# Patient Record
Sex: Female | Born: 1935 | Race: White | Hispanic: No | Smoking: Never smoker
Health system: Northeastern US, Academic
[De-identification: ages and names within clinical notes are randomized; demographics above are authoritative.]

---

## 2019-01-18 HISTORY — PX: EXPLORATORY LAPAROTOMY: SHX3000

## 2019-01-18 HISTORY — PX: RIGHT COLECTOMY: SHX853

## 2019-11-03 ENCOUNTER — Ambulatory Visit (HOSPITAL_BASED_OUTPATIENT_CLINIC_OR_DEPARTMENT_OTHER): Admitting: Family Medicine

## 2019-11-03 NOTE — Progress Notes (Signed)
* * *      [name], [name] **DOB:** Jan 13, 1935 (85 yo F) **Acc No.** [id number] **DOS:**  11/03/2019    ---        [name], [name]**    ------    32 Y old Female, DOB: Oct 17, 1934    [address] [place], [place] [address]    Home: [phone]    Provider: Cheryle Horsfall        * * *    Telephone Encounter    ---    Answered by    Lajean Saver    Date: 11/03/2019        Time: 09:31 AM    Reason    new patient / appt    ------            Message                      Patient's daughter [name] helped register with PH. Patient has previous medical records with her to bring in. She has filled out the new patient packet with all her meds and such. She had a bleeding ulcer last year but other than that no recent medical hx and no concerns right now. Patient is able to bring in her records and packet, OK to book?                Action Taken                      Kylia Grajales G 11/03/2019 9:43:11 AM >  OK to book this youngster      Phing,Molyda  11/03/2019 10:23:27 AM > Spoke to patient and she is booked 04/28 @ 11:30 for new patient establish care, has CPE in August 2020. She already filled out her NP packet and has her medical records, advised her to stop by next week to drop off so we can scan ahead of time                    * * *                ---          * * *          Provider: Cheryle Horsfall 11/03/2019    ---    Note generated by eClinicalWorks EMR/PM Software ([email or web address])

## 2019-11-15 ENCOUNTER — Ambulatory Visit (HOSPITAL_BASED_OUTPATIENT_CLINIC_OR_DEPARTMENT_OTHER): Admitting: Family Medicine

## 2019-11-15 ENCOUNTER — Ambulatory Visit: Admitting: Family Medicine

## 2019-11-15 LAB — HX CBC W/ INDICES
CASE NUMBER: 2021118001758
HX ABSOLUTE NRBC COUNT: 0 10*3/uL
HX HCT: 40.8 % — NL (ref 36.0–47.0)
HX HGB: 12.5 g/dL — NL (ref 11.8–16.0)
HX MCH: 27.3 pg — NL (ref 26.0–34.0)
HX MCHC: 30.6 g/dL — ABNORMAL LOW (ref 31.0–37.0)
HX MCV: 89.1 fL — NL (ref 80.0–100.0)
HX MPV: 12 fL — NL (ref 9.4–12.4)
HX NRBC PERCENT: 0 % — NL
HX PLATELET: 277 10*3/uL — NL (ref 150.0–400.0)
HX RBC: 4.58 10*6/uL — NL (ref 3.9–5.2)
HX RDW-CV: 15.6 % — ABNORMAL HIGH (ref 11.5–14.5)
HX RDW-SD: 50.4 fL — NL (ref 35.0–51.0)
HX WBC: 7.1 10*3/uL — NL (ref 3.7–11.2)

## 2019-11-15 LAB — HX TSH
CASE NUMBER: 2021118001758
HX 3RD GEN TSH: 2.83 u[IU]/mL — NL (ref 0.358–3.74)

## 2019-11-15 LAB — HX IRON PROFILE
CASE NUMBER: 2021118001758
HX % IRON BOUND: 13 % — NL (ref 10.0–50.0)
HX FERRITIN LVL: 36 ng/mL — NL (ref 11.0–307.0)
HX IRON: 49 ug/dL — NL (ref 30.0–160.0)
HX TIBC: 366 ug/dL — NL (ref 250.0–450.0)

## 2019-11-15 NOTE — Progress Notes (Signed)
[name], [name] **DOB:** 1935-04-05 (85 yo F) **Acc No.** [id number] **DOS:**  11/15/2019    ---      ** Progress Notes  **    ---    **Patient:** [name], [name]    **Account Number:** [id number]    **Provider:** Electa Sniff, M.D    **DOB:** 14-Nov-1934  **Age:** 85 Y  **Sex:** Female    **Date:** 11/15/2019    **Phone:** [phone]    **Address:** [address] [place], [place]        * * *        **Subjective:**        ---      **Chief Complaints:**    ------      1\. New patient establish care.    ------      **HPI:**    _Interim History_ :    85 yo recently moved from [place]. had been healthy for many years until last  year, dx with PUD. needed iron infusions. 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 tx for insomnia and hypothyroid. hx of basal cell  cancer.    ------      **ROS:**    _CONSTITUTIONAL_ :    Unexplained weight change  none  .  no  Fever.  no  Chills.    _OPTHALMOLOGY_ :    Glasses or contacts  none  .    _ENT_ :    Dental care  up-to-date  .  no  Rhinorrhea. Hoarseness  none  . Swollen Lymph  Nodes  none  .    _ALLERGY_ :    Environmental Allergies  none  .    _RESPIRATORY_ :    Shortness of Breath  none  . Persistent Cough  none  .    _CARDIOLOGY_ :    Chest Pain  none  . Palpitations  none  . Leg Edema  none  . Exertional  symptoms  none  .    _GASTROENTEROLOGY_ :    no  Heartburn.    _FEMALE REPRODUCTIVE_ :    Hot Flashes  none  . Abnormal Vaginal Discharge  none  . Dyspareunia  none  .  Breast lump  none  . Practices SBE  regulary  . Vaginal dryness  none  .    _UROLOGY_ :    Blood in Urine  none  . Urinary Incontinence  none  . Nocturia  None  .  Dysuria  none  .    _MUSCULOSKELETAL_ :    Positive for  OA of hands  .    _NEUROLOGY_ :    Positive for  neuropathy of toes  . Headaches  none  . Weakness  none  .  Tingling/Numbness  none  . Visual Changes  none  . Dizziness  none  .    _PSYCHOLOGY_ :    c/o  Sleep Disturbances.    _DERMATOLOGY_ :    New  or changing skin lesions  none  . Positive for  hx of basal cell cancer  .    _HEMATOLOGY/LYMPH_ :    Bleeding tendencies  none  .        ------      **Medical History:** PUD, Carcinoid tumor, Insomnia, Hypothyroid, Basal  cell cancer.        ------      **Surgical History:** PUD , partial colectomy 2020 .    ------      **Family History:**    no CAD,  no DM.    ------      **Social History:** no Tobacco Use . Seat belt: yes. no Alcohol. no Drug  use. Diet/Nutrition: Two meals a day. Exercise: yes. Marital Status: widowed.  no Domestic violence. Children: 3. Number of household members: 5. Occupation:  Retired Engineer, maintenance (IT). Occup. exposure: none. no Travel outside [place]. no  Caffeine. Home smoke detector use: yes. no Pets.    ------      **Medications:** Taking amitriptyline 50 mg tablet orally , Taking  levothyroxine 125 mcg (0.125 mg) tablet orally    ------      **Allergies:** N.K.D.A.    ------        **Objective:**        ---      **Vitals:** BP **132/80** , Temp **98.3** , Ht **63** , Wt **111** , BMI  **19.66** .    ------      **Examination:    ** _General Examination:_    General  thin. no dementia  .    HEENT:  clear conjunctiva  ,  TM's clear and flat bilaterally  ,  nose clear  ,  oropharynx clear with MMM  .    Oral cavity:  clear  ,  moist mucus membranes  .    Heart:  RRR  ,  no murmurs  .    Lungs:  clear to auscultation bilaterally  ,  no wheezes/rhonchi/rales  .    Abdomen:  soft, NT/ND, BS present  ,  no masses palpated  ,  no  hepatosplenomegaly  .    Back:  normal ROM of spine  .    Extremities  no clubbing, no edema  .    Peripheral pulses:  normal (2+) bilaterally  .    Skin:  healed scars from abdominal surgery last year.  .    Neurologic Exam:  numbness of toes.  Marland Kitchen        ------            **Assessment:**        ---      **Assessment:**        1\. Peptic ulcer disease - K27.9 (Primary)    2\. Iron deficiency anemia - D50.9    3\. Insomnia - G47.00    4\. Hypothyroid - E03.9     5\. Neuropathy - G62.9    6\. Skin cancer, basal cell - C44.91    7\. Hx of carcinoid syndrome - Z86.39    ------        **Plan:**        ---        **1\. Peptic ulcer disease**    _LAB: CBC w/ Indices_     Value    Reference Range    ---------    WBC    7.1      3.7-11.2 - thous/mm3    RBC    4.58      3.90-5.20 - Mil/mm3    ------------    Hgb    12.5      11.8-16.0 - Gm/dL    ------------    Hct    40.8      36.0-47.0 - %    ------------    MCV    89.1      80.0-100.0 - fL    ------------    MCH    27.3      26.0-34.0 - pGm    ------------    MCHC  30.6    L    31.0-37.0 - Gm/dL    ------------    Platelet    277      150-400 - thous/mm3    ------------    RDW-SD    50.4      35.0-51.0 - fL    ------------    MPV    12.0      9.4-12.4 - fL    ------------    _LAB: Iron Profile_   Value    Reference Range    ---------    TIBC    366      250-450 - mcg/dL    % Iron Bound    13      10-50 - %    ------------    Ferritin Lvl    36      11-307 - nGm/ml    ------------    Iron    49      30-160 - mcg/dL    ------------        Notes: pantoprazole and avoid triggers. check hct. needed iron infusions  several times last year.    **2\. Iron deficiency anemia**    Notes: recheck and transfuse prn.    **3\. Insomnia**    Notes: amitriptylline helps.    **4\. Hypothyroid**    _LAB: TSH_     Value    Reference Range    ---------    3rd Gen TSH    2.830      0.358-3.740 - mclU/ml        Notes: recheck and adjust prn.    **5\. Neuropathy**    Notes: both feet. failed gabapentin. not diabetic.    **6\. Skin cancer, basal cell**    Notes: needs derm follow up.    **7\. Hx of carcinoid syndrome**    Notes: had partial colectomy last year. weight stable.    **8\. Others**    Notes: will need old records.      **Preventive:**    Counseling: Vision Care . Dental Care . Diet . Exercise . Calcium and vitamin  D . Sunscreen . Breast  self exam .    ------      **Follow Up:** 6 Months, prn    ------    ---    ---                ---    Electronically signed by Electa Sniff , MD on 11/16/2019 at 08:15 AM EDT    Sign off status: Completed          * * *      **Provider:** Electa Sniff, M.D    **Date:** 11/15/2019    ------

## 2019-11-16 ENCOUNTER — Ambulatory Visit (HOSPITAL_BASED_OUTPATIENT_CLINIC_OR_DEPARTMENT_OTHER): Admitting: Family Medicine

## 2019-11-16 NOTE — Progress Notes (Signed)
* * *      [name], [name] **DOB:** [birth year] (85 yo F) **Acc No.** [id number] **DOS:**  11/16/2019    ---        [name], [name]**    ------    61 Y old Female, DOB: Oct 28, 1934    [address] [place], [place] [address]    Home: [phone]    Provider: Cheryle Horsfall        * * *    Telephone Encounter    ---    Answered by    Cheryle Horsfall    Date: 11/16/2019        Time: 07:16 PM    Reason    results    ------            Message                      lab tests normal                Action Taken                      [name] 11/16/2019 7:16:23 PM > reported                    * * *                ---          * * *          Provider: Cheryle Horsfall 11/16/2019    ---    Note generated by eClinicalWorks EMR/PM Software ([email or web address])

## 2019-11-16 NOTE — Progress Notes (Signed)
* * *      [name], [name] **DOB:** 1935/03/05 (85 yo F) **Acc No.** [id number] **DOS:**  11/16/2019    ---        [name], [name]**    ------    5 Y old Female, DOB: 18-Jun-1935    [address], [place], [place] [address]    Home: [phone]    Provider: Cheryle Horsfall        * * *    Telephone Encounter    ---    Answered by    Cheryle Horsfall    Date: 11/16/2019        Time: 10:52 AM    Reason    [facility] referral    ------            Message                      hx of basal cell cancer                Action Taken                      Lissy Deuser G 11/16/2019 10:52:51 AM >   pls refer to Dr Jenel Lucks group for this. tx [facility]      Parker,Amelia  11/16/2019 4:26:34 PM > booked for 12/21/19 at 2:00 PM with Zadie Cleverly. Patient notified of appt details. also looks like patient needs 27mo f/u - booked that while on the phone with her as well.                    * * *              * * *        ---        Reason for Appointment    ---      1\. [facility] referral    ---          * * *          Provider: Cheryle Horsfall 11/16/2019    ---    Note generated by eClinicalWorks EMR/PM Software ([email or web address])

## 2019-11-16 NOTE — Progress Notes (Signed)
* * *      [place], [place] **DOB:** 05/02/1935 (85 yo F) **Acc No.** [id number] **DOS:**  11/16/2019    ---        [name], [name]**    ------    68 Y old Female, DOB: 1935/02/23    [address], [place], [place] [address]    Home: [phone]    Provider: Cheryle Horsfall        * * *    Telephone Encounter    ---    Answered by    Cheryle Horsfall    Date: 11/16/2019        Time: 07:47 AM    Message                      refills and derm referral        ------            Action Taken                      Ory Elting G 11/16/2019 7:47:21 AM >  erx done                Refills    Refill amitriptyline tablet, 50 mg, orally, 180, 1-2 tabs, qhs,  Refills=1    ------      Refill levothyroxine tablet, 125 mcg (0.125 mg), orally, 90, 1 tab(s), qd,  Refills=1          * * *                ---          * * *          Provider: Cheryle Horsfall 11/16/2019    ---    Note generated by eClinicalWorks EMR/PM Software ([email or web address])

## 2019-11-23 ENCOUNTER — Ambulatory Visit (HOSPITAL_BASED_OUTPATIENT_CLINIC_OR_DEPARTMENT_OTHER): Admitting: Family Medicine

## 2019-11-23 ENCOUNTER — Inpatient Hospital Stay
Admit: 2019-11-23 | Disposition: A | Discharge status: Home / Self Care | Source: Home / Self Care | Attending: Internal Medicine | Admitting: Internal Medicine

## 2019-11-23 LAB — HX COMPREHENSIVE METABOLIC PANEL
CASE NUMBER: 2021126002568
HX ALBUMIN LVL: 4.5 g/dL — NL (ref 3.2–5.0)
HX ALKALINE PHOSPHATASE: 143 U/L — ABNORMAL HIGH (ref 30.0–117.0)
HX ALT: 18 U/L — NL (ref 6.0–55.0)
HX ANION GAP: 8 — NL (ref 3.0–11.0)
HX AST: 13 U/L — NL (ref 6.0–40.0)
HX BILIRUBIN TOTAL: 0.4 mg/dL — NL (ref 0.2–1.2)
HX BUN: 28 mg/dL — ABNORMAL HIGH (ref 8.0–23.0)
HX CALCIUM LVL: 11.5 mg/dL — ABNORMAL HIGH (ref 8.5–10.5)
HX CHLORIDE: 98 mmol/L — NL (ref 98.0–110.0)
HX CO2: 28 mmol/L — NL (ref 21.0–32.0)
HX CREATININE: 0.927 mg/dL — NL (ref 0.55–1.3)
HX GLUCOSE LVL: 150 mg/dL — ABNORMAL HIGH (ref 70.0–110.0)
HX POTASSIUM LVL: 3.6 mmol/L — NL (ref 3.6–5.2)
HX SODIUM LVL: 134 mmol/L — ABNORMAL LOW (ref 136.0–146.0)
HX TOTAL PROTEIN: 8.7 g/dL — ABNORMAL HIGH (ref 6.0–8.4)

## 2019-11-23 LAB — HX CBC W/ DIFF
CASE NUMBER: 2021126002568
HX ABSOLUTE NRBC COUNT: 0 10*3/uL
HX HCT: 46.6 % — NL (ref 36.0–47.0)
HX HGB: 15.4 g/dL — NL (ref 11.8–16.0)
HX MCH: 28.2 pg — NL (ref 26.0–34.0)
HX MCHC: 33 g/dL — NL (ref 31.0–37.0)
HX MCV: 85.2 fL — NL (ref 80.0–100.0)
HX MPV: 11.5 fL — NL (ref 9.4–12.4)
HX NRBC PERCENT: 0 % — NL
HX PLATELET: 444 10*3/uL — ABNORMAL HIGH (ref 150.0–400.0)
HX RBC: 5.47 10*6/uL — ABNORMAL HIGH (ref 3.9–5.2)
HX RDW-CV: 14.6 % — ABNORMAL HIGH (ref 11.5–14.5)
HX RDW-SD: 44.9 fL — NL (ref 35.0–51.0)
HX WBC: 22.3 10*3/uL — ABNORMAL HIGH (ref 3.7–11.2)

## 2019-11-23 LAB — HX LIPASE LEVEL
CASE NUMBER: 21066835
HX LIPASE LVL: 260 U/L — NL (ref 73.0–393.0)

## 2019-11-23 LAB — HX BLUE TOP TO HOLD: CASE NUMBER: 2021126002568

## 2019-11-23 LAB — HX SST GOLD TUBE TO HOLD: CASE NUMBER: 2021126002568

## 2019-11-23 LAB — HX URINE DIPSTICK W/REFLEX
CASE NUMBER: 2021126002569
HX UA BILIRUBIN: NEGATIVE — NL
HX UA BLOOD: NEGATIVE — NL
HX UA GLUCOSE: NEGATIVE — NL
HX UA HYALINE CASTS: 1 /LPF — NL (ref 0.0–4.0)
HX UA KETONES: NEGATIVE — NL
HX UA LEUKOCYTE ESTERASE: NEGATIVE — NL
HX UA NITRITE: NEGATIVE — NL
HX UA PH: 6 — NL (ref 5.0–8.0)
HX UA PROTEIN: 100 mg/dL — AB
HX UA RBC: 1 /HPF — NL (ref 0.0–2.0)
HX UA SPECIFIC GRAVITY: 1.021 — NL (ref 1.003–1.03)
HX UA SQUAMOUS EPITHELIAL: <1 — NL (ref 0.0–5.0)
HX UA UROBILINOGEN: NEGATIVE — NL
HX UA WBC: 1 /HPF — NL (ref 0.0–5.0)

## 2019-11-23 LAB — HX TROPONIN I
CASE NUMBER: 21066835
HX TROPONIN I: <0.015 — NL (ref 0.015–0.045)

## 2019-11-23 LAB — HX .AUTOMATED DIFF
CASE NUMBER: 2021126002568
HX ABSOLUTE BASO COUNT: 0.05 10*3/uL — NL (ref 0.0–0.22)
HX ABSOLUTE EOS COUNT: 0.01 10*3/uL — NL (ref 0.0–0.45)
HX ABSOLUTE LYMPHS COUNT: 1 10*3/uL — NL (ref 0.74–5.04)
HX ABSOLUTE MONO COUNT: 1.52 10*3/uL — ABNORMAL HIGH (ref 0.0–1.34)
HX ABSOLUTE NEUTRO COUNT: 19.62 10*3/uL — ABNORMAL HIGH (ref 1.48–7.95)
HX BASOPHILS: 0.2 %
HX EOSINOPHILS: 0 %
HX IMMATURE GRANULOCYTES: 0.6 % — NL (ref 0.0–2.0)
HX LYMPHOCYTES: 4.5 %
HX MONOCYTES: 6.8 %
HX NEUTROPHILS: 87.9 %

## 2019-11-23 LAB — HX LACTIC ACID
CASE NUMBER: 2021126003106
CASE NUMBER: 2021126003284
HX LACTIC ACID LVL: 2.4 mmol/L — ABNORMAL HIGH (ref 0.4–2.0)
HX LACTIC ACID LVL: 3 mmol/L — ABNORMAL HIGH (ref 0.4–2.0)

## 2019-11-23 LAB — HX GLOMERULAR FILTRATION RATE (ESTIMATED)
CASE NUMBER: 2021126002568
HX AFN AMER GLOMERULAR FILTRATION RATE: 66 mL/min/{1.73_m2}
HX NON-AFN AMER GLOMERULAR FILTRATION RATE: 57 mL/min/{1.73_m2}

## 2019-11-23 NOTE — Consults (Signed)
 cc: Rebbeca Paul, M.D.  ____________________________________________________________    CONSULTATION  DATE:  11/24/2019    PRIMARY CARE PHYSICIAN:  Electa Sniff, M.D.    HISTORY OF PRESENT ILLNESS:  The patient is an 85 year old female who used to   live in [place] most of her life.  She recently moved here.  She was having   abdominal pain for two or three days, mostly in the epigastrium and right upper   quadrant area.  She vomited a few times.  She was noted to have dark stools.    Her hemoglobin dropped from 15.6 to 8.4 overnight.    EGD was done earlier today by Dr. Elesa Hacker.  I reviewed the findings with her.    The patient has a large duodenal ulcer, but the scope was able to pass through   okay.    The patient had what sounds like perforated peptic ulcer disease in the spring   of 2020. at that time, she was taken to the Operating Room and underwent   laparotomy and what sounds like a Midwife.  The appendix was   incidentally removed.  It turns out the pathology showed a carcinoid.  She then   underwent a right colectomy through another midline incision also in [place].  I  only have the pathology report on the right colectomy and it shows no further   carcinoid noted and 16 lymph nodes are negative.  I do not have either operative  report.  There is a note in the chart here that she had her gallbladder removed.   The patient tells me she still has her gallbladder.    PAST MEDICAL HISTORY:  Hypothyroidism, GERD, depression.    PAST SURGICAL HISTORY:  As above.  Excision of BCC on the anterior abdominal   wall a few months ago.    SOCIAL HISTORY:  Nonsmoker and nondrinker.    ALLERGIES:  None.    MEDICATIONS:  1.  Amitriptyline.  2.  Levothyroxine.  3.  Protonix, she is supposed to take 40 mg daily; however, she tells me she   more often than does not take it at all.    REVIEW OF SYSTEMS:  No cardiac or pulmonary problems.  Otherwise, all other   systems reviewed and negative.    LABORATORY  DATA:  Reviewed.  This morning, BMP is unremarkable.  Calcium 11.5   yesterday, 7.8 today.  Lactic acid normalized.  White count 15.9.    RADIOLOGY DATA:  CAT scan of the abdomen and pelvis was done yesterday evening.   I reviewed the images.  A moderate-sized hiatal hernia noted.  Stomach was very   distended gallbladder was definitely noted and unremarkable.  Right colon is   absent.  Ileocolic anastomosis noted in the right abdomen.    ASSESSMENT:  Peptic ulcer disease, recurrent, although suboptimally treated.    PLAN:  At this point, we will continue with an IV Protonix twice a day and   discharge her home on twice a day proton pump inhibitor with a plan to rescope   in three to four weeks.  If she continues to have any bleeding or develops   gastric outlet obstruction, or if there is nonhealing after multiple weeks of a   PPI, we will proceed with antrectomy and gastrojejunostomy as well as vagotomy.   This was discussed at length with her and her daughter at the bedside.  We will   follow closely.    Dictated by:  Beverly Milch, M.D.    DD: 11/24/2019 17:42:57  DT: 11/27/2019 10:04:00  WM/wmt  Job #: [id number]   SIGNATURE LINE    Electronically signed by Donny Pique MD, [place] on 12/14/2019 at 13:02:30 EST

## 2019-11-23 NOTE — Progress Notes (Signed)
 Name :  [name], [name]    DOB :  [birth year]    Sex :  Female    MRN :  [id number]    Subjective    Chart and medications reviewed    No complaints of bright red bleeding per rectum, nausea, vomiting    Review of Systems    Objective    Vitals & Measurements    **T: **97.9 ??F (Oral) **TMIN: **97.9 ??F (Oral) **TMAX: **99.0 ??F (Oral) **HR:  **100 **RR: **24 **BP: **137/67 **SpO2: **97%    Physical Exam    General: Alert and oriented, no acute distress    Eye: PERRL, Normal Conjunctiva    HEENT: Normocephalic, no damage to dentition, TM's clear, good light reflex,  normal hearing, moist oral mucosa, no pharyngeal erythema, ear canals patent,  no sinus tenderness    Neck: supple, nontender, carotid pulse WNL, no carotid bruit, no JVD, no  lymphadenopathy, no thyromegaly, full ROM    Respiratory: Lungs clear to auscultation, respirations non-labored, breath  sounds equal and regular, symmetrical chest expansion, no chest wall  tenderness    Cardiovascular: Normal Rate, normal rhythm, no gallop, good pulses equal in  all extremities, normal peripheral perfusion,    Gastrointestinal: Abdomen soft, non tender, non-distended, normal bowel sounds  all four quadrants, no organomegaly,    Genitourinary: No CVA tenderness,    Lymphatics: no lymphadenopathy neck, axilla, groin    Musculoskeletal: normal ROM, normal strength, no tenderness, no swelling, no  deformity, normal gait    Integumentary: Skin intact, warm, pink, dry. No pallor, no rash.    Neurologic: Alert, Oriented, normal sensory, normal motor, no focal deficits.    Cognition and Speech: Oriented, speech clear and coherent, functional  cognition intact, expression WNL    Psychiatric: Cooperative, appropriate mood & affect, normal judgment, non-  suicidal      Medications    _Inpatient_    acetaminophen tablet, 650 mg= 2 tab(s), PO, q4hr, PRN    amitriptyline, 100 mg= 2 tab(s), PO, HS    Lactated Ringers 1,000 mL, 1000 mL, IV    levothyroxine, 125 mcg= 1 tab(s),  PO, Daily    morPHINE, 2 mg= 1 mL, IV Push, q2hr, PRN    morPHINE, 2 mg= 1 mL, IV Push, q4hr, PRN    NaCl 0.9% bolus 1,000 mL, 1000 mL, IV    NaCl 0.9% bolus 1,000 mL, 1000 mL, IV    Sodium Chloride 0.9% 1,000 mL, 1000 mL, IV    Sodium Chloride 0.9% 250 mL, 250 mL, IV    Sodium Chloride 0.9% 50 mL + pantoprazole IV Additive 40 mg    Sodium Chloride 0.9% Flush, 3 mL, Flush, qshift    Zofran, 4 mg= 2 mL, IV Push, q8hr, PRN    Zosyn    Lab Results    Glucose Lvl: 88 mg/dL (68/11/57 26:20:35)    BUN: 10 mg/dL (59/74/16 38:45:36)    Creatinine: 0.422 mg/dL Low (46/80/32 12:24:82)    Afn Amer Glomerular Filtration Rate: >90 (11/25/19 06:01:00)    Non-Afn Amer Glomerular Filtration Rate: >90 (11/25/19 06:01:00)    Sodium Lvl: 140 mmol/L (11/25/19 06:01:00)    Potassium Lvl: 3.2 mmol/L Low (11/25/19 06:01:00)    Chloride: 108 mmol/L (11/25/19 06:01:00)    CO2: 25 mmol/L (11/25/19 06:01:00)    Anion Gap: 7 (11/25/19 06:01:00)    Total Protein: 5.5 Gm/dL Low (50/03/70 48:88:91)    Albumin Lvl: 3 Gm/dL Low (69/45/03 88:82:80)    Calcium Lvl: 7.9  mg/dL Low (12/81/18 86:77:37)    Bilirubin Total: 0.5 mg/dL (36/68/15 94:70:76)    Alkaline Phosphatase: 96 Units/L (11/25/19 06:01:00)    AST: 10 Units/L (11/25/19 06:01:00)    ALT: 13 Units/L (11/25/19 06:01:00)    WBC: 10.6 thous/mm3 (11/25/19 06:01:00)    RBC: 3.53 Mil/mm3 Low (11/25/19 06:01:00)    Hgb: 10.2 Gm/dL Low (15/18/34 37:35:78)    Hct: 30.4 % Low (11/25/19 06:01:00)    Platelet: 200 thous/mm3 (11/25/19 06:01:00)    MCV: 86.1 fL (11/25/19 06:01:00)    MCH: 28.9 pGm (11/25/19 06:01:00)    MCHC: 33.6 Gm/dL (97/84/78 41:28:20)    RDW-SD: 45.1 fL (11/25/19 06:01:00)    MPV: 11.2 fL (11/25/19 06:01:00)    NRBC Percent: 0 % (11/25/19 06:01:00)    Absolute NRBC Count: 0 thous/mm3 (11/25/19 06:01:00)    Blue Top Tube To Hold: DONE (11/25/19 06:01:00)    Diagnostic Results    Impression and Plan        1.Normocytic anemia likely secondary to acute blood loss due to melena  -  history of PUD (colectomy in July 2020?\- records pending )    Endoscopic findings reviewed t.  She has a persistent duodenal ulcer with  gastric outlet obstruction despite PPI therapy.        -Status post one unit packed blood transfusion    -GI on board for EGD today    -IV PPI drip    -Portable chest x-ray ordered    -CT abdomen showed gastric outlet obstruction. GI consulted as above. General surgery was consulted    -KUB shows obstructive bowel pattern    -EKG> sinus tach, one troponin negative. Second troponin pending. BNP as baseline pending.No chest pain.    -No recent echocardiogram - Will consider repeating ECHO once more stable    -Remains on IV fluids    -Zofran    -Pain control    -UA> No UTI    -Was started on IV antibiotic, vancomycin and Zosyn  on for consideration of possible colitis elevated white count.    -Stop vancomycin. Continue Zosyn Day 2    -Blood culture obtained.    -Reassuring Lactic        #Hypothyroidism    -On Synthroid         hypokalemia replete potassium        DVT prophylaxis SCDs, chemical prophylaxis on hold due to melena    GI prophylaxis  on IV PPI drip    CODE STATUS FC    Face-to-face patient counseling more than 50% time, total time 35 minutes      SIGNATURE LINE Electronically signed by Derry Lory MD-HOSP, Raegan Winders on  11/25/2019 at 18:58:22 EST

## 2019-11-23 NOTE — Procedures (Signed)
 ____________________________________________________________    ENDOSCOPY PROCEDURE NOTE  DATE:  11/24/2019    PROCEDURE:  Esophagogastroduodenoscopy.    INDICATIONS:  An 85 year old woman who reportedly had surgery for peptic ulcer   disease in [place] admitted with melena and anemia, undergoing endoscopy.    MEDICATIONS:  See Anesthesia notes.    INSTRUMENT:  Olympus diagnostic upper endoscope.    ENDOSCOPIST:  Rebbeca Paul, M.D.    DESCRIPTION OF PROCEDURE:  After informed consent was obtained, the patient was   placed in the left lateral decubitus position.  Diagnostic endoscope was used to  intubate the esophagus under direct visualization.  The mid and distal esophagus  was notable for white exudates suggestive of yeast esophagitis.  Brushings were   obtained.  The scope was then advanced into the stomach.  There was thick fluid   and retained food contents noted.  I was able to visualize the entire stomach.    There appeared to be no evidence of prior surgery.  The duodenal bulb was   notable for a large necrotic ulcer, likely causing a gastric outlet obstruction.   I was able to go beyond the ulcer to visualize a normal second portion of the   duodenum.    IMPRESSION:  Duodenal ulcer, large and necrotic causing bleeding and anemia and   gastric outlet obstruction.    RECOMMENDATIONS:  Await fungal pathology.  I have reached out to Dr. Donny Pique   who will see the patient for consultation and surgery.    Dictated by:  Rebbeca Paul, M.D.    DD: 11/24/2019 12:50:43  DT: 11/27/2019 06:20:00  GA/par  Job #: [id number]   SIGNATURE LINE    Electronically signed by Elesa Hacker MD, Lannie Fields on 11/27/2019 at 09:30:59 EST

## 2019-11-23 NOTE — Progress Notes (Signed)
 Name :  [name], [name]    DOB :  [id number]    Sex :  Female    MRN :  [id number]    Subjective    No complaints -- "feels good" today    Tolerating full liquids    No pain    Objective    Vitals & Measurements    **T: **98.7 ??F (Oral) **HR: **95 **RR: **15 **BP: **139/71 **SpO2: **98%    **HT: **160 cm **WT: **53.3 Kg **BMI: **20.82    Abdomen soft    Impression and Plan    Esophagitis    Vomiting    Doing well    Cont current care    Possible advance diet tomorrow          Medications    _Inpatient_    acetaminophen tablet, 650 mg= 2 tab(s), PO, q4hr, PRN    amitriptyline, 100 mg= 2 tab(s), PO, HS    levothyroxine, 125 mcg= 1 tab(s), PO, Daily    morPHINE, 2 mg= 1 mL, IV Push, q2hr, PRN    morPHINE, 2 mg= 1 mL, IV Push, q4hr, PRN    Sodium Chloride 0.9% 1,000 mL, 1000 mL, IV    Sodium Chloride 0.9% 50 mL + pantoprazole IV Additive 40 mg    Sodium Chloride 0.9% Flush, 3 mL, Flush, qshift    Zofran, 4 mg= 2 mL, IV Push, q8hr, PRN    Lab Results    Glucose Lvl: 98 mg/dL (26/37/85 88:50:27)    BUN: 4 mg/dL Low (74/12/87 86:76:72)    Creatinine: 0.482 mg/dL Low (09/47/09 62:83:66)    Afn Amer Glomerular Filtration Rate: >90 (11/27/19 07:37:00)    Non-Afn Amer Glomerular Filtration Rate: >90 (11/27/19 07:37:00)    Sodium Lvl: 141 mmol/L (11/27/19 07:37:00)    Potassium Lvl: 3.9 mmol/L (11/27/19 07:37:00)    Chloride: 109 mmol/L (11/27/19 07:37:00)    CO2: 27 mmol/L (11/27/19 07:37:00)    Anion Gap: 5 (11/27/19 07:37:00)    Total Protein: 5.7 Gm/dL Low (29/47/65 46:50:35)    Albumin Lvl: 3 Gm/dL Low (46/56/81 27:51:70)    Calcium Lvl: 8.3 mg/dL Low (01/74/94 49:67:59)    Magnesium Lvl: 2.3 mg/dL (16/38/46 65:99:35)    Bilirubin Total: 0.3 mg/dL (70/17/79 39:03:00)    Alkaline Phosphatase: 85 Units/L (11/27/19 07:37:00)    AST: 12 Units/L (11/27/19 07:37:00)    ALT: 14 Units/L (11/27/19 07:37:00)    WBC: 6.3 thous/mm3 (11/27/19 07:37:00)    RBC: 3.43 Mil/mm3 Low (11/27/19 07:37:00)    Hgb: 9.8 Gm/dL Low (92/33/00  76:22:63)    Hct: 30.2 % Low (11/27/19 07:37:00)    Platelet: 219 thous/mm3 (11/27/19 07:37:00)    MCV: 88 fL (11/27/19 07:37:00)    MCH: 28.6 pGm (11/27/19 07:37:00)    MCHC: 32.5 Gm/dL (33/54/56 25:63:89)    RDW-SD: 46 fL (11/27/19 07:37:00)    MPV: 11 fL (11/27/19 07:37:00)    NRBC Percent: 0 % (11/27/19 07:37:00)    Absolute NRBC Count: 0 thous/mm3 (11/27/19 07:37:00)    Diagnostic Results        ------        SIGNATURE LINE Electronically signed by Leveda Anna MD, Rosalyn Gess on 11/27/2019 at  16:55:19 EST

## 2019-11-23 NOTE — Discharge Summary (Signed)
 Name :  [name], [name]    DOB :  [id number]    Sex :  Female    MRN :  [id number]    Date of Admission    11/23/2019    Date of Discharge    11/28/2019    Admission History    85 year old female patient with past medical history of hypothyroidism, GERD,  depression, from [place] to [place] came for evaluation of abdominal  pain    Patient presented abdominal pain. Onset was 3 days ago. Course/duration of  symptom is constant. Character symptom is dull. Daily onset was moderate.  Location of the pain right lateral quadrant. Degree at present is moderate. No  aggravating or relieving factors. Associated with distention of abdomen and  nausea and 1 episode of nonbilious nonbloody vomiting but patient states that  she is able to pass bowel last bowel movement was yesterday and a small amount  of feces    Denies any chest pain shortness of breath palpitation diaphoresis leg swelling  calf tenderness fever chills or flulike symptoms or any other complaint at  this time    Patient had 2 abdominal surgery one was cholecystectomy other one was removal  of the appendix and the partial removal of the colon she does not remember  what exactly the abdomen it was done in [place]    Vital signs revealed temperature of 97.4, pulse of 1 1 2/min, respiratory rate  of 20/min, blood pressure of 148 x 84 mmHg saturating at 97%    Chemistry shows hyponatremia plasma sodium of 134    Lactic acid of 2.4 increased to 3 0    CBC revealed leukocytosis of 22.3 thrombocytosis of 444 urine is negative for  infection and Covid is negative    Code Status    Code Status - Ordered    -- 11/23/19 22:11:00 EDT, Full Resuscitation, Constant Order    Allergies    NKA    Social History    Hospital Course    85 yo female HD 6 for obstructing severe duodenal bulb ulcer        #Sepsis    Shock patient meets the criteria of sepsis with leukocytosis of 22.3 and  lactate of 2.4 tachycardia    Patient received 2 L of saline    We will continue normal  saline at 200 mils per hour    Patient was started on vancomycin and Zosyn in the ER    We will continue same antibiotic for now    Follow culture report    Consider surgical consult to rule out subacute intestinal obstruction  secondary to adhesions    Surgery Rec:    - gastric level ordered    - famotidine BID added - she should be discharged on this for new home  medication (Protonix 40 BID & Pepcid 20 mg po BID)    - pain control    - encourage OOB/ambulation    5/11 OK to discharge from surgery standpoint    Dr Donny Pique following                #Abd Pain (Complaint of)    Esophagitis    Abdominal pain    Patient presents abdominal pain or distention with nausea and vomiting    Had a prior surgery or the abdomen details unknown    CT scan of the abdomen shows esophagitis and distended stomach suggestive of  gastric outlet obstruction    N.p.o., IV fluids for  hydration    Zofran 4 mg IV every 8 hourly    Protonix 40 mg IV daily    Gastroenterology consult in a.m. for possible EGD    5/11-See Surgery note        #Hypothyroidism    5/11-Levothyroxine 120 mg p.o. at 6 AM        #Depression    5/11-Amitriptyline 100 mg p.o. daily        #GERD    5/11-Protonix 40 mg p.o. is on hold since she is on IV Protonix            Face to face patient counselling/coordinating care more than 50%of encounter  Time: Yes        Total encounter time DSCHG>30 minutes            #Normocytic anemia likely secondary to acute blood loss due to melena -  history of PUD (colectomy in July 2020?- records pending Large duodenal ulcer  refractory to PPI therapy    Endoscopic findings reviewed t.  She has a persistent duodenal ulcer with  gastric outlet obstruction despite PPI therapy.    Status post one unit packed blood transfusion    -s/p EGD    -IV PPI    -CT abdomen showed gastric outlet obstruction. GI consulted as above. General surgery was consulted    -KUB shows obstructive bowel pattern    -EKG> sinus tach, one troponin negative.  Second troponin pending. BNP as baseline pending.No chest pain.    -No recent echocardiogram - Will consider repeating ECHO once more stable    -Zofran    -Pain control    -UA> No UTI    -Was started on IV antibiotic, vancomycin and Zosyn  on for consideration of possible colitis elevated white count.    -Stop vancomycin. Continue Zosyn for now will discontinue \- her blood cultures are negative    -Blood culture obtained.    -Reassuring Lactic    diet as she tolerates per surgery        #Hypothyroidism    5/11-On Synthroid        #Hypokalemia    5/11-Replete potassium    K=3.6        DVT prophylaxis SCDs, chemical prophylaxis on hold due to melena    GI prophylaxis  on IV PPI        CODE STATUS FC    Face-to-face patient counseling more than 50% time, total time  DSCHG>30"minutes    Procedures and Treatment Provided    S/P EGD 11/24/2019    Impression    1\.  Large duodenal ulcer refractory to PPI therapy    2 gastric outlet obstruction    3 anemia.    Check gastrin level    Physical Exam    Vitals & Measurements    **T: **97 F (Oral) **TMIN: **97 F (Oral) **TMAX: **98.7 F (Oral) **HR:  **86(Peripheral) **RR: **20 **BP: **141/67 **SpO2: **63% **WT: **54.0 Kg    General: Alert and oriented, no acute distress    Eye: PERRL, Normal Conjunctiva    HEENT: Normocephalic, no damage to dentition, TM's clear, good light reflex,  normal hearing, moist oral mucosa, no pharyngeal erythema, ear canals patent,  no sinus tenderness    Neck: supple, nontender, carotid pulse WNL, no carotid bruit, no JVD, no  lymphadenopathy, no thyromegaly, full ROM    Respiratory: Lungs clear to auscultation, respirations non-labored, breath  sounds equal and regular, symmetrical chest expansion, no chest wall  tenderness  Cardiovascular: Normal Rate, normal rhythm, no gallop, good pulses equal in  all extremities, normal peripheral perfusion,    Gastrointestinal: Abdomen soft, non tender, non-distended, normal bowel sounds  all four  quadrants, no organomegaly,    Genitourinary: No CVA tenderness,    Lymphatics: no lymphadenopathy neck, axilla, groin    Musculoskeletal: normal ROM, normal strength, no tenderness, no swelling, no  deformity, normal gait    Integumentary: Skin intact, warm, pink, dry. No pallor, no rash.    Neurologic: Alert, Oriented, normal sensory, normal motor, no focal deficits    Lab Results    Glucose Lvl: 93 mg/dL (16/10/96 04:54:09)    BUN: 4 mg/dL Low (81/19/14 78:29:56)    Creatinine: 0.485 mg/dL Low (21/30/86 57:84:69)    Afn Amer Glomerular Filtration Rate: >90 (11/28/19 07:17:00)    Non-Afn Amer Glomerular Filtration Rate: >90 (11/28/19 07:17:00)    Sodium Lvl: 142 mmol/L (11/28/19 07:17:00)    Potassium Lvl: 3.6 mmol/L (11/28/19 07:17:00)    Chloride: 110 mmol/L (11/28/19 07:17:00)    CO2: 29 mmol/L (11/28/19 07:17:00)    Anion Gap: 3 (11/28/19 07:17:00)    Total Protein: 5.7 Gm/dL Low (62/95/28 41:32:44)    Albumin Lvl: 3 Gm/dL Low (07/21/70 53:66:44)    Calcium Lvl: 8.3 mg/dL Low (03/47/42 59:56:38)    Bilirubin Total: 0.2 mg/dL (75/64/33 29:51:88)    Alkaline Phosphatase: 89 Units/L (11/28/19 07:17:00)    AST: 8 Units/L (11/28/19 07:17:00)    ALT: 15 Units/L (11/28/19 07:17:00)    WBC: 5.8 thous/mm3 (11/28/19 07:17:00)    RBC: 3.34 Mil/mm3 Low (11/28/19 07:17:00)    Hgb: 9.8 Gm/dL Low (41/66/06 30:16:01)    Hct: 29.2 % Low (11/28/19 07:17:00)    Platelet: 231 thous/mm3 (11/28/19 07:17:00)    MCV: 87.4 fL (11/28/19 07:17:00)    MCH: 29.3 pGm (11/28/19 07:17:00)    MCHC: 33.6 Gm/dL (09/32/35 57:32:20)    RDW-SD: 46.1 fL (11/28/19 07:17:00)    MPV: 11 fL (11/28/19 07:17:00)    NRBC Percent: 0 % (11/28/19 07:17:00)    Absolute NRBC Count: 0 thous/mm3 (11/28/19 07:17:00)    Discharge Diagnoses    Abd Pain (Complaint of)    Esophagitis    Sepsis    Vomiting    Discharge Medications    _Discharge_    amitriptyline 50 mg oral tablet, 100 mg= 2 tab(s), PO, HS, 1-2 tabs    famotidine 20 mg oral tablet, 20 mg= 1 tab(s), PO,  BID    levothyroxine 125 mcg (0.125 mg) oral tablet, 125 mcg= 1 tab(s), PO, Daily    Protonix 40 mg oral granule, enteric coated, 40 mg= 1 EA, PO, BID    Discharge Instructions    - gastric level ordered    - famotidine 20 mg po BID x 1 month    Protonix 40 mg po BID x 1 month    F/U with GI as directed    F/U with PCP in 7-10 days after discharge      Face to Face    DSCHG>30"    SIGNATURE LINE Electronically signed by Brynda Peon MD, Seema on 12/01/2019 at  14:50:02 EST

## 2019-11-23 NOTE — Progress Notes (Signed)
 Name :  [name], [name]    DOB :  [id number]    Sex :  Female    MRN :  [id number]    Subjective    doing well, no abd pain    tol clears, no N&V    + loose Bm    Objective    Vitals & Measurements    **T: **98.0 ??F (Oral) **HR: **94 **RR: **18 **BP: **148/76 **SpO2: **97%    **HT: **160 cm **WT: **53.3 Kg **BMI: **20.82    aao    abd soft NT ND    Impression and Plan    Esophagitis    PUD    advance diet slowly    will follow          potassium chloride, Dose: 40 mEq = 30 mL, Soln, PO, q4hr, For: 2  time(s)/dose(s), First Dose Date/Time: 11/26/19 11:19:00 EDT    Full Liquid Diet        Medications    _Inpatient_    acetaminophen tablet, 650 mg= 2 tab(s), PO, q4hr, PRN    amitriptyline, 100 mg= 2 tab(s), PO, HS    Lactated Ringers 1,000 mL, 1000 mL, IV    levothyroxine, 125 mcg= 1 tab(s), PO, Daily    morPHINE, 2 mg= 1 mL, IV Push, q2hr, PRN    morPHINE, 2 mg= 1 mL, IV Push, q4hr, PRN    NaCl 0.9% bolus 1,000 mL, 1000 mL, IV    NaCl 0.9% bolus 1,000 mL, 1000 mL, IV    Potassium Chloride Oral, 40 mEq= 30 mL, PO, q4hr    Sodium Chloride 0.9% 1,000 mL, 1000 mL, IV    Sodium Chloride 0.9% 250 mL, 250 mL, IV    Sodium Chloride 0.9% 50 mL + pantoprazole IV Additive 40 mg    Sodium Chloride 0.9% Flush, 3 mL, Flush, qshift    Zofran, 4 mg= 2 mL, IV Push, q8hr, PRN    Zosyn    Lab Results    Glucose Lvl: 89 mg/dL (45/80/99 83:38:25)    BUN: 8 mg/dL (05/39/76 73:41:93)    Creatinine: 0.477 mg/dL Low (79/02/40 97:35:32)    Afn Amer Glomerular Filtration Rate: >90 (11/26/19 06:28:00)    Non-Afn Amer Glomerular Filtration Rate: >90 (11/26/19 06:28:00)    Sodium Lvl: 139 mmol/L (11/26/19 06:28:00)    Potassium Lvl: 3 mmol/L Low (11/26/19 06:28:00)    Chloride: 108 mmol/L (11/26/19 06:28:00)    CO2: 26 mmol/L (11/26/19 06:28:00)    Anion Gap: 5 (11/26/19 06:28:00)    Total Protein: 5.8 Gm/dL Low (99/24/26 83:41:96)    Albumin Lvl: 2.8 Gm/dL Low (22/29/79 89:21:19)    Calcium Lvl: 7.8 mg/dL Low (41/74/08 14:48:18)    Bilirubin  Total: 0.5 mg/dL (56/31/49 70:26:37)    Alkaline Phosphatase: 87 Units/L (11/26/19 06:28:00)    AST: 8 Units/L (11/26/19 06:28:00)    ALT: 11 Units/L (11/26/19 06:28:00)    WBC: 7 thous/mm3 (11/26/19 06:28:00)    RBC: 3.35 Mil/mm3 Low (11/26/19 06:28:00)    Hgb: 9.8 Gm/dL Low (85/88/50 27:74:12)    Hct: 28.6 % Low (11/26/19 06:28:00)    Platelet: 200 thous/mm3 (11/26/19 06:28:00)    MCV: 85.4 fL (11/26/19 06:28:00)    MCH: 29.3 pGm (11/26/19 06:28:00)    MCHC: 34.3 Gm/dL (87/86/76 72:09:47)    RDW-SD: 44.5 fL (11/26/19 06:28:00)    MPV: 11.1 fL (11/26/19 06:28:00)    NRBC Percent: 0 % (11/26/19 06:28:00)    Absolute NRBC Count: 0 thous/mm3 (11/26/19 06:28:00)  Blue Top Tube To Hold: DONE (11/26/19 06:28:00)    Diagnostic Results        ------        SIGNATURE LINE Electronically signed by Donny Pique MD, Wassim on 11/26/2019 at  14:32:18 EST

## 2019-11-23 NOTE — Progress Notes (Signed)
Name :  [name], [name]    DOB :  [id number]    Sex :  Female    MRN :  [id number]    Subjective    patient feels better today    Review of Systems    Objective    Vitals & Measurements    **T: **98.0 F (Oral) **TMIN: **97.5 F (Oral) **TMAX: **99.7 F (Oral) **HR:  **94 **RR: **18 **BP: **148/76 **SpO2: **97%    Physical Exam    General: Alert and oriented, no acute distress    Eye: PERRL, Normal Conjunctiva    HEENT: Normocephalic, no damage to dentition, TM's clear, good light reflex,  normal hearing, moist oral mucosa, no pharyngeal erythema, ear canals patent,  no sinus tenderness    Neck: supple, nontender, carotid pulse WNL, no carotid bruit, no JVD, no  lymphadenopathy, no thyromegaly, full ROM    Respiratory: Lungs clear to auscultation, respirations non-labored, breath  sounds equal and regular, symmetrical chest expansion, no chest wall  tenderness    Cardiovascular: Normal Rate, normal rhythm, no gallop, good pulses equal in  all extremities, normal peripheral perfusion,    Gastrointestinal: Abdomen soft, non tender, non-distended, normal bowel sounds  all four quadrants, no organomegaly,    Genitourinary: No CVA tenderness,    Lymphatics: no lymphadenopathy neck, axilla, groin    Musculoskeletal: normal ROM, normal strength, no tenderness, no swelling, no  deformity, normal gait    Integumentary: Skin intact, warm, pink, dry. No pallor, no rash.    Neurologic: Alert, Oriented, normal sensory, normal motor, no focal deficits.    Medications    _Inpatient_    acetaminophen tablet, 650 mg= 2 tab(s), PO, q4hr, PRN    amitriptyline, 100 mg= 2 tab(s), PO, HS    Lactated Ringers 1,000 mL, 1000 mL, IV    levothyroxine, 125 mcg= 1 tab(s), PO, Daily    morPHINE, 2 mg= 1 mL, IV Push, q2hr, PRN    morPHINE, 2 mg= 1 mL, IV Push, q4hr, PRN    NaCl 0.9% bolus 1,000 mL, 1000 mL, IV    NaCl 0.9% bolus 1,000 mL, 1000 mL, IV    Potassium Chloride Oral, 40 mEq= 30 mL, PO, q4hr    Sodium Chloride 0.9% 1,000 mL, 1000  mL, IV    Sodium Chloride 0.9% 250 mL, 250 mL, IV    Sodium Chloride 0.9% 50 mL + pantoprazole IV Additive 40 mg    Sodium Chloride 0.9% Flush, 3 mL, Flush, qshift    Zofran, 4 mg= 2 mL, IV Push, q8hr, PRN    Zosyn    Lab Results    Glucose Lvl: 89 mg/dL (19/14/78 29:56:21)    BUN: 8 mg/dL (30/86/57 84:69:62)    Creatinine: 0.477 mg/dL Low (95/28/41 32:44:01)    Afn Amer Glomerular Filtration Rate: >90 (11/26/19 06:28:00)    Non-Afn Amer Glomerular Filtration Rate: >90 (11/26/19 06:28:00)    Sodium Lvl: 139 mmol/L (11/26/19 06:28:00)    Potassium Lvl: 3 mmol/L Low (11/26/19 06:28:00)    Chloride: 108 mmol/L (11/26/19 06:28:00)    CO2: 26 mmol/L (11/26/19 06:28:00)    Anion Gap: 5 (11/26/19 06:28:00)    Total Protein: 5.8 Gm/dL Low (02/72/53 66:44:03)    Albumin Lvl: 2.8 Gm/dL Low (47/42/59 56:38:75)    Calcium Lvl: 7.8 mg/dL Low (64/33/29 51:88:41)    Bilirubin Total: 0.5 mg/dL (66/06/30 16:01:09)    Alkaline Phosphatase: 87 Units/L (11/26/19 06:28:00)    AST: 8 Units/L (11/26/19 06:28:00)    ALT:  11 Units/L (11/26/19 06:28:00)    WBC: 7 thous/mm3 (11/26/19 06:28:00)    RBC: 3.35 Mil/mm3 Low (11/26/19 06:28:00)    Hgb: 9.8 Gm/dL Low (41/32/44 01:02:72)    Hct: 28.6 % Low (11/26/19 06:28:00)    Platelet: 200 thous/mm3 (11/26/19 06:28:00)    MCV: 85.4 fL (11/26/19 06:28:00)    MCH: 29.3 pGm (11/26/19 06:28:00)    MCHC: 34.3 Gm/dL (53/66/44 03:47:42)    RDW-SD: 44.5 fL (11/26/19 06:28:00)    MPV: 11.1 fL (11/26/19 06:28:00)    NRBC Percent: 0 % (11/26/19 06:28:00)    Absolute NRBC Count: 0 thous/mm3 (11/26/19 06:28:00)    Blue Top Tube To Hold: DONE (11/26/19 06:28:00)    Diagnostic Results    Impression and Plan    .Normocytic anemia likely secondary to acute blood loss due to melena -  history of PUD (colectomy in July 2020?\- records pending )    Endoscopic findings reviewed t.  She has a persistent duodenal ulcer with  gastric outlet obstruction despite PPI therapy.        -Status post one unit packed blood  transfusion    -GI on board for EGD today    -IV PPI drip    -Portable chest x-ray ordered    -CT abdomen showed gastric outlet obstruction. GI consulted as above. General surgery was consulted    -KUB shows obstructive bowel pattern    -EKG> sinus tach, one troponin negative. Second troponin pending. BNP as baseline pending.No chest pain.    -No recent echocardiogram - Will consider repeating ECHO once more stable    -Zofran    -Pain control    -UA> No UTI    -Was started on IV antibiotic, vancomycin and Zosyn  on for consideration of possible colitis elevated white count.    -Stop vancomycin. Continue Zosyn for now will discontinue if her blood cultures are negative    -Blood culture obtained.    -Reassuring Lactic    diet as she tolerates per surgery    #Hypothyroidism    -On Synthroid         hypokalemia replete potassium        DVT prophylaxis SCDs, chemical prophylaxis on hold due to melena    GI prophylaxis  on IV PPI drip    CODE STATUS FC    Face-to-face patient counseling more than 50% time, total time 35 minutes      SIGNATURE LINE Electronically signed by Derry Lory MD-HOSP, Zackari Ruane on  11/26/2019 at 15:25:20 EST

## 2019-11-23 NOTE — Progress Notes (Signed)
 Name :  [name], [name]    DOB :  [id number]    Sex :  Female    MRN :  [id number]    Subjective    in endoscopy    Review of Systems    10 point review of system is negative except per history of present illness    Objective    Vitals & Measurements    **T: **99.0 ??F (Oral) **TMIN: **97.0 ??F (Axillary) **TMAX: **99.0 ??F (Oral)  **HR: **102 **RR: **18 **BP: **169/71 **SpO2: **100% **WT: **53.3 Kg    Physical Exam    in endoscopy    Medications    _Inpatient_    acetaminophen tablet, 650 mg= 2 tab(s), PO, q4hr, PRN    amitriptyline, 100 mg= 2 tab(s), PO, HS    Lactated Ringers 1,000 mL, 1000 mL, IV    levothyroxine, 125 mcg= 1 tab(s), PO, Daily    morPHINE, 2 mg= 1 mL, IV Push, q2hr, PRN    morPHINE, 2 mg= 1 mL, IV Push, q4hr, PRN    NaCl 0.9% bolus 1,000 mL, 1000 mL, IV    NaCl 0.9% bolus 1,000 mL, 1000 mL, IV    Sodium Chloride 0.9% 1,000 mL, 1000 mL, IV    Sodium Chloride 0.9% 250 mL, 250 mL, IV    Sodium Chloride 0.9% 50 mL + pantoprazole IV Additive 40 mg    Sodium Chloride 0.9% Flush, 3 mL, Flush, qshift    Zofran, 4 mg= 2 mL, IV Push, q8hr, PRN    Zosyn    Lab Results    Glucose Lvl: 111 mg/dL High (25/42/70 62:37:62)    BUN: 37 mg/dL High (83/15/17 61:60:73)    Creatinine: 0.516 mg/dL Low (71/06/26 94:85:46)    Afn Amer Glomerular Filtration Rate: >90 (11/24/19 05:51:00)    Non-Afn Amer Glomerular Filtration Rate: 89 ml/min/1.33m2 (11/24/19 05:51:00)    Sodium Lvl: 141 mmol/L (11/24/19 05:51:00)    Potassium Lvl: 4.2 mmol/L (11/24/19 05:51:00)    Chloride: 115 mmol/L High (11/24/19 05:51:00)    CO2: 22 mmol/L (11/24/19 05:51:00)    Anion Gap: 4 (11/24/19 05:51:00)    Total Protein: 8.7 Gm/dL High (27/03/50 09:38:18)    Albumin Lvl: 4.5 Gm/dL (29/93/71 69:67:89)    Calcium Lvl: 7.8 mg/dL Low (38/10/17 51:02:58)    Bilirubin Total: 0.4 mg/dL (52/77/82 42:35:36)    Alkaline Phosphatase: 143 Units/L High (11/23/19 16:30:00)    AST: 13 Units/L (11/23/19 16:30:00)    ALT: 18 Units/L (11/23/19 16:30:00)     Lipase Lvl: 260 Units/L (11/23/19 16:30:00)    Lactic Acid Lvl: 1.2 mmol/L (11/24/19 05:51:00)    Troponin I: <0.015 (11/23/19 16:30:00)    WBC: 15.9 thous/mm3 High (11/24/19 05:51:00)    RBC: 3.03 Mil/mm3 Low (11/24/19 05:51:00)    Hgb: 8 Gm/dL Low (14/43/15 40:08:67)    Hct: 25.6 % Low (11/24/19 09:22:00)    Platelet: 262 thous/mm3 (11/24/19 05:51:00)    MCV: 89.8 fL (11/24/19 05:51:00)    MCH: 27.7 pGm (11/24/19 05:51:00)    MCHC: 30.9 Gm/dL Low (61/95/09 32:67:12)    RDW-SD: 48.4 fL (11/24/19 05:51:00)    MPV: 11.3 fL (11/24/19 05:51:00)    Absolute Neutro Count: 12.71 thous/mm3 High (11/24/19 05:51:00)    Absolute Lymphs Count: 1.75 thous/mm3 (11/24/19 05:51:00)    Absolute Mono Count: 1.34 thous/mm3 (11/24/19 05:51:00)    Absolute Eos Count: 0 thous/mm3 (11/24/19 05:51:00)    Absolute Baso Count: 0.03 thous/mm3 (11/24/19 05:51:00)    Neutrophils: 80.1 % (11/24/19 05:51:00)  Lymphocytes: 11 % (11/24/19 05:51:00)    Monocytes: 8.4 % (11/24/19 05:51:00)    Eosinophils: 0 % (11/24/19 05:51:00)    Basophils: 0.2 % (11/24/19 05:51:00)    Immature Granulocytes: 0.3 % (11/24/19 05:51:00)    NRBC Percent: 0 % (11/24/19 05:51:00)    Absolute NRBC Count: 0 thous/mm3 (11/24/19 05:51:00)    UA Color: Straw1 (11/23/19 15:48:00)    UA Clarity: Clear1 (11/23/19 15:48:00)    UA Specific Gravity: 1.021 (11/23/19 15:48:00)    UA pH: 6 (11/23/19 15:48:00)    UA Protein: 100 Abnormal (11/23/19 15:48:00)    UA Glucose: Negative1 (11/23/19 15:48:00)    UA Ketones: Negative1 (11/23/19 15:48:00)    UA Bilirubin: Negative1 (11/23/19 15:48:00)    UA Blood: Negative1 (11/23/19 15:48:00)    UA Urobilinogen: Negative1 (11/23/19 15:48:00)    UA Nitrite: Negative1 (11/23/19 15:48:00)    UA Leukocyte Esterase: Negative1 (11/23/19 15:48:00)    UA RBC: 1 /HPF (11/23/19 15:48:00)    UA WBC: 1 /HPF (11/23/19 15:48:00)    UA Squamous Epithelial: <1 (11/23/19 15:48:00)    UA Bacteria: None1 (11/23/19 15:48:00)    UA Mucous: Few (11/23/19  15:48:00)    UA Hyaline Casts: 1 /LPF (11/23/19 15:48:00)    COVID Source: NP Swab (11/23/19 18:04:00)    COVID19 by PCR: Not Detected Panther (11/23/19 18:04:00)    ABO/Rh Type: A POS (11/24/19 09:22:00)    Antibody Screen Automated: Negative (11/24/19 09:22:00)    Electronic Crossmatch: Computer XM OK (11/24/19 09:22:00)    PT: 12.2 sec High (11/24/19 05:51:00)    INR: 1.1 (11/24/19 05:51:00)    aPTT: 25 sec (11/24/19 05:51:00)    Blue Top Tube To Hold: DONE (11/23/19 16:30:00)    Diagnostic Results    Impression and Plan    Abd Pain (Complaint of)    Esophagitis    Sepsis    Vomiting    acetaminophen, Dose: 650 mg = 2 tab(s), Tab, PO, q4hr, PRN, Fever/Pain, Mild  to Moderate, First Dose Date/Time: 11/24/19 14:57:00 EDT    CBC w/ Indices    Comprehensive Metabolic Panel    Consult to Specialist    Crossmatch    NT-ProBNP    Troponin I            #Normocytic anemia likely secondary to acute blood loss due to melena -  history of PUD (colectomy in July 2020?\- records pending )        -Status post one unit packed blood transfusion    -GI on board for EGD today    -IV PPI drip    -Nothing by mouth    -Portable chest x-ray ordered    -CT abdomen showed gastric outlet obstruction. GI consulted as above. General surgery was consulted    -KUB shows obstructive bowel pattern    -EKG> sinus tach, one troponin negative. Second troponin pending. BNP as baseline pending.No chest pain.    -No recent echocardiogram - Will consider repeating ECHO once more stable    -Remains on IV fluids    -Zofran    -Pain control    -UA> No UTI    -Was started on IV antibiotic, vancomycin and Zosyn  on for consideration of possible colitis elevated white count.    -Stop vancomycin. Continue Zosyn Day 1    -Blood culture obtained.    -Reassuring Lactic        #Hypothyroidism    -On Synthroid        DVT prophylaxis SCDs, chemical prophylaxis  on hold due to melena    GI prophylaxis  on IV PPI drip    CODE STATUS FC    Face-to-face patient  counseling more than 50% time, total time 30 minutes      SIGNATURE LINE Electronically signed by Layla Barter MD, Ashiah Karpowicz on 11/24/2019 at  19:14:39 EST

## 2019-11-23 NOTE — Progress Notes (Signed)
 Name :  DANIYA, ARAMBURO    DOB :  [id number]    Sex :  Female    MRN :  [id number]    note dictated    perf PUD last year, pt tells me she rarely took her protonix    continue w BID PPI    attempt clears    rescope in 3-4 weeks        surgery if she bleeds, can't eat, or does not heal in few weeks        will follow closely    SIGNATURE LINE Electronically signed by Donny Pique MD, Wassim on 11/24/2019 at  17:44:50 EST

## 2019-11-23 NOTE — H&P (Signed)
Name :  [name], [name]    DOB :  [id number]    Sex :  Female    MRN :  [id number]    Chief Complaint    Abdominal pain    History of Present Illness    85 year old female patient with past medical history of hypothyroidism, GERD,  depression, from [place] to [place] came for evaluation of abdominal  pain    Patient presented abdominal pain. Onset was 3 days ago. Course/duration of  symptom is constant. Character symptom is dull. Daily onset was moderate.  Location of the pain right lateral quadrant. Degree at present is moderate. No  aggravating or relieving factors. Associated with distention of abdomen and  nausea and 1 episode of nonbilious nonbloody vomiting but patient states that  she is able to pass bowel last bowel movement was yesterday and a small amount  of feces    Denies any chest pain shortness of breath palpitation diaphoresis leg swelling  calf tenderness fever chills or flulike symptoms or any other complaint at  this time    Patient had 2 abdominal surgery one was cholecystectomy other one was removal  of the appendix and the partial removal of the colon she does not remember  what exactly the abdomen it was done in [place]    Vital signs revealed temperature of 97.4, pulse of 1 1 2/min, respiratory rate  of 20/min, blood pressure of 148 x 84 mmHg saturating at 97%    Chemistry shows hyponatremia plasma sodium of 134    Lactic acid of 2.4 increased to 3 0    CBC revealed leukocytosis of 22.3 thrombocytosis of 444 urine is negative for  infection and Covid is negative    Review of Systems    10 point review of systems negative except for those mentioned in history of  presenting illness    Code Status    Code Status - Ordered    -- 11/23/19 22:11:00 EDT, Full Resuscitation, Constant Order    Physical Exam    Vitals & Measurements    **T: **98.2 F (Oral) **TMIN: **97.4 F (Oral) **TMAX: **98.2 F (Oral) **HR:  **114 **RR: **20 **BP: **116/93 **SpO2: **92% **WT: **53.3 Kg    General:  Alert and oriented, no acute distress    Eye: PERRL, Normal Conjunctiva    HEENT: Normocephalic, no damage to dentition, TM's clear, good light reflex,  normal hearing, moist oral mucosa, no pharyngeal erythema, ear canals patent,  no sinus tenderness    Neck: supple, nontender, carotid pulse WNL, no carotid bruit, no JVD, no  lymphadenopathy, no thyromegaly, full ROM    Respiratory: Lungs clear to auscultation, respirations non-labored, breath  sounds equal and regular, symmetrical chest expansion, no chest wall  tenderness    Cardiovascular: Normal Rate, normal rhythm,_ beats/minute, no gallop, good  pulses equal in all extremities, normal peripheral perfusion, no edema.    Breast: no masses, no tenderness, no discharge    Gastrointestinal: Abdomen soft, non tender, non-distended, normal bowel sounds  all four quadrants, no organomegaly, normal rectal tone    Genitourinary: No CVA tenderness, normal genitalia for age & sex, no inguinal  tenderness, no urethral discharge, no lesions    Lymphatics: no lymphadenopathy neck, axilla, groin    Musculoskeletal: normal ROM, normal strength, no tenderness, no swelling, no  deformity, normal gait    Gynecology: _    Integumentary: Skin intact, warm, pink, dry. No pallor, no rash.    Neurologic: Alert, Oriented, normal  sensory, normal motor, no focal deficits.  Gag reflex normal, CN II-XII intact. Normal appearing back/spine. Normal  reflexes, normal DTR's, Normal coordination, normal gait, normal balance    Cognition and Speech: Oriented, speech clear and coherent, functional  cognition intact, expression WNL    Psychiatric: Cooperative, appropriate mood & affect, normal judgment, non-  suicidal      Impression and Plan    Abdominal pain    Patient presents abdominal pain or distention with nausea and vomiting    Had a prior surgery or the abdomen details unknown    CT scan of the abdomen shows esophagitis and distended stomach suggestive of  gastric outlet obstruction     N.p.o., IV fluids for hydration    Zofran 4 mg IV every 8 hourly    Protonix 40 mg IV daily    Gastroenterology consult in a.m. for possible EGD        Hypothyroidism    Levothyroxine 120 mg p.o. at 6 AM        Depression    Amitriptyline 100 mg p.o. daily        GERD    Protonix 40 mg p.o. is on hold since she is on IV Protonix        Sepsis    Shock patient meets the criteria of sepsis with leukocytosis of 22.3 and  lactate of 2.4 tachycardia    Patient received 2 L of saline    We will continue normal saline at 200 mils per hour    Patient was started on vancomycin and Zosyn in the ER    We will continue same antibiotic for now    Follow culture report    Consider surgical consult to rule out subacute intestinal obstruction  secondary to adhesions        Face to face patient counselling/coordinating care more than 50%of encounter  Time: Yes    More then 2days hospital stay    Total encounter time 60 minutes                      Problem List/Past Medical History    Ongoing    No qualifying data    Historical    No qualifying data    Procedure/Surgical History    Social History    Family History    Allergies    NKA    Medications    _Inpatient_    amitriptyline, 100 mg= 2 tab(s), PO, HS    levothyroxine, 125 mcg= 1 tab(s), PO, Daily    morPHINE, 2 mg= 1 mL, IV Push, q2hr, PRN    morPHINE, 2 mg= 1 mL, IV Push, q4hr, PRN    NaCl 0.9% bolus 1,000 mL, 1000 mL, IV    NaCl 0.9% bolus 1,000 mL, 1000 mL, IV    Protonix, 40 mg= 1 EA, IV Push, q24hr    Sodium Chloride 0.9% 1,000 mL, 1000 mL, IV    Sodium Chloride 0.9% Flush, 3 mL, Flush, qshift    vancomycin    VANCOMYCIN PROTOCOL SEE DOSE, 1 EA, N/A, Daily    Zofran, 4 mg= 2 mL, IV Push, q8hr, PRN    Zosyn    _Home_    amitriptyline 50 mg oral tablet, 100 mg= 2 tab(s), PO, HS, 1-2 tabs    levothyroxine 125 mcg (0.125 mg) oral tablet, 125 mcg= 1 tab(s), PO, Daily    Protonix 40 mg oral granule, enteric coated, 40 mg= 1 EA, PO, Daily  Diet    No qualifying data  available.    Lab Results    Glucose Lvl: 150 mg/dL High (95/63/87 56:43:32)    BUN: 28 mg/dL High (95/18/84 16:60:63)    Creatinine: 0.927 mg/dL (01/60/10 93:23:55)    Afn Amer Glomerular Filtration Rate: 66 ml/min/1.71m2 (11/23/19 16:30:00)    Non-Afn Amer Glomerular Filtration Rate: 57 ml/min/1.23m2 (11/23/19 16:30:00)    Sodium Lvl: 134 mmol/L Low (11/23/19 16:30:00)    Potassium Lvl: 3.6 mmol/L (11/23/19 16:30:00)    Chloride: 98 mmol/L (11/23/19 16:30:00)    CO2: 28 mmol/L (11/23/19 16:30:00)    Anion Gap: 8 (11/23/19 16:30:00)    Total Protein: 8.7 Gm/dL High (73/22/02 54:27:06)    Albumin Lvl: 4.5 Gm/dL (23/76/28 31:51:76)    Calcium Lvl: 11.5 mg/dL High (16/07/37 10:62:69)    Bilirubin Total: 0.4 mg/dL (48/54/62 70:35:00)    Alkaline Phosphatase: 143 Units/L High (11/23/19 16:30:00)    AST: 13 Units/L (11/23/19 16:30:00)    ALT: 18 Units/L (11/23/19 16:30:00)    Lipase Lvl: 260 Units/L (11/23/19 16:30:00)    Lactic Acid Lvl: 3 mmol/L High (11/23/19 22:41:00)    Troponin I: <0.015 (11/23/19 16:30:00)    WBC: 22.3 thous/mm3 High (11/23/19 16:30:00)    RBC: 5.47 Mil/mm3 High (11/23/19 16:30:00)    Hgb: 15.4 Gm/dL (93/81/82 99:37:16)    Hct: 46.6 % (11/23/19 16:30:00)    Platelet: 444 thous/mm3 High (11/23/19 16:30:00)    MCV: 85.2 fL (11/23/19 16:30:00)    MCH: 28.2 pGm (11/23/19 16:30:00)    MCHC: 33 Gm/dL (96/78/93 81:01:75)    RDW-SD: 44.9 fL (11/23/19 16:30:00)    MPV: 11.5 fL (11/23/19 16:30:00)    Absolute Neutro Count: 19.62 thous/mm3 High (11/23/19 16:30:00)    Absolute Lymphs Count: 1 thous/mm3 (11/23/19 16:30:00)    Absolute Mono Count: 1.52 thous/mm3 High (11/23/19 16:30:00)    Absolute Eos Count: 0.01 thous/mm3 (11/23/19 16:30:00)    Absolute Baso Count: 0.05 thous/mm3 (11/23/19 16:30:00)    Neutrophils: 87.9 % (11/23/19 16:30:00)    Lymphocytes: 4.5 % (11/23/19 16:30:00)    Monocytes: 6.8 % (11/23/19 16:30:00)    Eosinophils: 0 % (11/23/19 16:30:00)    Basophils: 0.2 % (11/23/19 16:30:00)     Immature Granulocytes: 0.6 % (11/23/19 16:30:00)    NRBC Percent: 0 % (11/23/19 16:30:00)    Absolute NRBC Count: 0 thous/mm3 (11/23/19 16:30:00)    UA Color: Straw1 (11/23/19 15:48:00)    UA Clarity: Clear1 (11/23/19 15:48:00)    UA Specific Gravity: 1.021 (11/23/19 15:48:00)    UA pH: 6 (11/23/19 15:48:00)    UA Protein: 100 Abnormal (11/23/19 15:48:00)    UA Glucose: Negative1 (11/23/19 15:48:00)    UA Ketones: Negative1 (11/23/19 15:48:00)    UA Bilirubin: Negative1 (11/23/19 15:48:00)    UA Blood: Negative1 (11/23/19 15:48:00)    UA Urobilinogen: Negative1 (11/23/19 15:48:00)    UA Nitrite: Negative1 (11/23/19 15:48:00)    UA Leukocyte Esterase: Negative1 (11/23/19 15:48:00)    UA RBC: 1 /HPF (11/23/19 15:48:00)    UA WBC: 1 /HPF (11/23/19 15:48:00)    UA Squamous Epithelial: <1 (11/23/19 15:48:00)    UA Bacteria: None1 (11/23/19 15:48:00)    UA Mucous: Few (11/23/19 15:48:00)    UA Hyaline Casts: 1 /LPF (11/23/19 15:48:00)    COVID Source: NP Swab (11/23/19 18:04:00)    COVID19 by PCR: Not Detected Panther (11/23/19 18:04:00)    Blue Top Tube To Hold: DONE (11/23/19 16:30:00)    Diagnostic Results        ------  SIGNATURE LINE Electronically signed by Steele Sizer MD, Shantrell Placzek on 11/24/2019  at 05:06:36 EST

## 2019-11-23 NOTE — Progress Notes (Signed)
* * *      [name], [name] **DOB:** 1934-11-02 (85 yo F) **Acc No.** [id number] **DOS:**  11/23/2019    ---        [name], [name]**    ------    66 Y old Female, DOB: [birth year]    [address] [place], [place] [address]    Home: [phone]    Provider: Cheryle Horsfall        * * *    Telephone Encounter    ---    Answered by    Tori Milks    Date: 11/23/2019        Time: 08:22 AM    Reason    Called/need former pcp fax number    ------            Message                      Called patient a couple times asking in assistance in obtaining a fax number for her former pcp in [place].                Action Taken                      Phing,Molyda  11/23/2019 10:42:46 AM > Eber Jones from prev PCP's office called back (ph___#[phone] / Dr. Andrey Cota) Fax for medical records dept___# [phone]      [facility]  11/23/2019 1:15:52 PM > Faxed record release                    * * *                ---          * * *          Provider: Cheryle Horsfall 11/23/2019    ---    Note generated by eClinicalWorks EMR/PM Software ([email or web address])

## 2019-11-23 NOTE — Progress Notes (Signed)
 Name :  [name], [name]    DOB :  [id number]    Sex :  Female    MRN :  [id number]    Subjective    no new complaints    no nausea vomiting    Review of Systems    Objective    Vitals & Measurements    **T: **98.2 ??F (Oral) **TMIN: **98.1 ??F (Oral) **TMAX: **98.6 ??F (Oral) **HR:  **95 **RR: **15 **BP: **113/99 **SpO2: **98%    Physical Exam    General: Alert and oriented, no acute distress    Eye: PERRL, Normal Conjunctiva    HEENT: Normocephalic, no damage to dentition, TM's clear, good light reflex,  normal hearing, moist oral mucosa, no pharyngeal erythema, ear canals patent,  no sinus tenderness    Neck: supple, nontender, carotid pulse WNL, no carotid bruit, no JVD, no  lymphadenopathy, no thyromegaly, full ROM    Respiratory: Lungs clear to auscultation, respirations non-labored, breath  sounds equal and regular, symmetrical chest expansion, no chest wall  tenderness    Cardiovascular: Normal Rate, normal rhythm, no gallop, good pulses equal in  all extremities, normal peripheral perfusion,    Gastrointestinal: Abdomen soft, non tender, non-distended, normal bowel sounds  all four quadrants, no organomegaly,    Genitourinary: No CVA tenderness,    Lymphatics: no lymphadenopathy neck, axilla, groin    Musculoskeletal: normal ROM, normal strength, no tenderness, no swelling, no  deformity, normal gait    Integumentary: Skin intact, warm, pink, dry. No pallor, no rash.    Neurologic: Alert, Oriented, normal sensory, normal motor, no focal deficits    Medications    _Inpatient_    acetaminophen tablet, 650 mg= 2 tab(s), PO, q4hr, PRN    amitriptyline, 100 mg= 2 tab(s), PO, HS    levothyroxine, 125 mcg= 1 tab(s), PO, Daily    morPHINE, 2 mg= 1 mL, IV Push, q2hr, PRN    morPHINE, 2 mg= 1 mL, IV Push, q4hr, PRN    Sodium Chloride 0.9% 1,000 mL, 1000 mL, IV    Sodium Chloride 0.9% 50 mL + pantoprazole IV Additive 40 mg    Sodium Chloride 0.9% Flush, 3 mL, Flush, qshift    Zofran, 4 mg= 2 mL, IV Push, q8hr,  PRN    Lab Results    Glucose Lvl: 98 mg/dL (19/14/78 29:56:21)    BUN: 4 mg/dL Low (30/86/57 84:69:62)    Creatinine: 0.482 mg/dL Low (95/28/41 32:44:01)    Afn Amer Glomerular Filtration Rate: >90 (11/27/19 07:37:00)    Non-Afn Amer Glomerular Filtration Rate: >90 (11/27/19 07:37:00)    Sodium Lvl: 141 mmol/L (11/27/19 07:37:00)    Potassium Lvl: 3.9 mmol/L (11/27/19 07:37:00)    Chloride: 109 mmol/L (11/27/19 07:37:00)    CO2: 27 mmol/L (11/27/19 07:37:00)    Anion Gap: 5 (11/27/19 07:37:00)    Total Protein: 5.7 Gm/dL Low (02/72/53 66:44:03)    Albumin Lvl: 3 Gm/dL Low (47/42/59 56:38:75)    Calcium Lvl: 8.3 mg/dL Low (64/33/29 51:88:41)    Magnesium Lvl: 2.3 mg/dL (66/06/30 16:01:09)    Bilirubin Total: 0.3 mg/dL (32/35/57 32:20:25)    Alkaline Phosphatase: 85 Units/L (11/27/19 07:37:00)    AST: 12 Units/L (11/27/19 07:37:00)    ALT: 14 Units/L (11/27/19 07:37:00)    WBC: 6.3 thous/mm3 (11/27/19 07:37:00)    RBC: 3.43 Mil/mm3 Low (11/27/19 07:37:00)    Hgb: 9.8 Gm/dL Low (42/70/62 37:62:83)    Hct: 30.2 % Low (11/27/19 07:37:00)    Platelet: 219 thous/mm3 (  11/27/19 07:37:00)    MCV: 88 fL (11/27/19 07:37:00)    MCH: 28.6 pGm (11/27/19 07:37:00)    MCHC: 32.5 Gm/dL (25/63/89 37:34:28)    RDW-SD: 46 fL (11/27/19 07:37:00)    MPV: 11 fL (11/27/19 07:37:00)    NRBC Percent: 0 % (11/27/19 07:37:00)    Absolute NRBC Count: 0 thous/mm3 (11/27/19 07:37:00)    Diagnostic Results    Impression and Plan    Abd Pain (Complaint of)    Esophagitis    Transfer Pending    .Normocytic anemia likely secondary to acute blood loss due to melena -  history of PUD (colectomy in July 2020?\- records pending )    Endoscopic findings reviewed t.  She has a persistent duodenal ulcer with  gastric outlet obstruction despite PPI therapy.        -Status post one unit packed blood transfusion    -s/p EGD    -IV PPI    -CT abdomen showed gastric outlet obstruction. GI consulted as above. General surgery was consulted    -KUB shows  obstructive bowel pattern    -EKG> sinus tach, one troponin negative. Second troponin pending. BNP as baseline pending.No chest pain.    -No recent echocardiogram - Will consider repeating ECHO once more stable    -Zofran    -Pain control    -UA> No UTI    -Was started on IV antibiotic, vancomycin and Zosyn  on for consideration of possible colitis elevated white count.    -Stop vancomycin. Continue Zosyn for now will discontinue \- her blood cultures are negative    -Blood culture obtained.    -Reassuring Lactic    diet as she tolerates per surgery    #Hypothyroidism    -On Synthroid         hypokalemia replete potassium        DVT prophylaxis SCDs, chemical prophylaxis on hold due to melena    GI prophylaxis  on IV PPI    CODE STATUS FC    Face-to-face patient counseling more than 50% time, total time 35 minutes    SIGNATURE LINE Electronically signed by Derry Lory MD-HOSP, [place]ham Roughton on  11/27/2019 at 15:27:10 EST

## 2019-11-23 NOTE — Progress Notes (Signed)
 Name :  [name], [name]    DOB :  [id number]    Sex :  Female    MRN :  [id number]    Subjective    doing ok    pain free    tol clears now    no blood in the stools    Objective    Vitals & Measurements    **T: **98.4 ??F (Oral) **HR: **94 **RR: **19 **BP: **155/69 **SpO2: **95%    **HT: **160 cm **WT: **53.3 Kg **BMI: **20.82    aao    abd soft NT ND    Impression and Plan    Abd Pain (Complaint of)    PUD    since pt had 2 surgeries in the RUQ in the past, and with the severe  inflammation currently in the area, surgery will be higher risk than the usual    therefore will reserve surgery for bleeding, GOO, or non healing on maximum  medical therapy (which may very well be the case anyway)        will follow closely        Medications    _Inpatient_    acetaminophen tablet, 650 mg= 2 tab(s), PO, q4hr, PRN    amitriptyline, 100 mg= 2 tab(s), PO, HS    Lactated Ringers 1,000 mL, 1000 mL, IV    levothyroxine, 125 mcg= 1 tab(s), PO, Daily    morPHINE, 2 mg= 1 mL, IV Push, q2hr, PRN    morPHINE, 2 mg= 1 mL, IV Push, q4hr, PRN    NaCl 0.9% bolus 1,000 mL, 1000 mL, IV    NaCl 0.9% bolus 1,000 mL, 1000 mL, IV    Sodium Chloride 0.9% 1,000 mL, 1000 mL, IV    Sodium Chloride 0.9% 250 mL, 250 mL, IV    Sodium Chloride 0.9% 50 mL + pantoprazole IV Additive 40 mg    Sodium Chloride 0.9% Flush, 3 mL, Flush, qshift    Zofran, 4 mg= 2 mL, IV Push, q8hr, PRN    Zosyn    Lab Results    Glucose Lvl: 88 mg/dL (29/47/65 46:50:35)    BUN: 10 mg/dL (46/56/81 27:51:70)    Creatinine: 0.422 mg/dL Low (01/74/94 49:67:59)    Afn Amer Glomerular Filtration Rate: >90 (11/25/19 06:01:00)    Non-Afn Amer Glomerular Filtration Rate: >90 (11/25/19 06:01:00)    Sodium Lvl: 140 mmol/L (11/25/19 06:01:00)    Potassium Lvl: 3.2 mmol/L Low (11/25/19 06:01:00)    Chloride: 108 mmol/L (11/25/19 06:01:00)    CO2: 25 mmol/L (11/25/19 06:01:00)    Anion Gap: 7 (11/25/19 06:01:00)    Total Protein: 5.5 Gm/dL Low (16/38/46 65:99:35)    Albumin Lvl: 3  Gm/dL Low (70/17/79 39:03:00)    Calcium Lvl: 7.9 mg/dL Low (92/33/00 76:22:63)    Bilirubin Total: 0.5 mg/dL (33/54/56 25:63:89)    Alkaline Phosphatase: 96 Units/L (11/25/19 06:01:00)    AST: 10 Units/L (11/25/19 06:01:00)    ALT: 13 Units/L (11/25/19 06:01:00)    WBC: 10.6 thous/mm3 (11/25/19 06:01:00)    RBC: 3.53 Mil/mm3 Low (11/25/19 06:01:00)    Hgb: 10.2 Gm/dL Low (37/34/28 76:81:15)    Hct: 30.4 % Low (11/25/19 06:01:00)    Platelet: 200 thous/mm3 (11/25/19 06:01:00)    MCV: 86.1 fL (11/25/19 06:01:00)    MCH: 28.9 pGm (11/25/19 06:01:00)    MCHC: 33.6 Gm/dL (72/62/03 55:97:41)    RDW-SD: 45.1 fL (11/25/19 06:01:00)    MPV: 11.2 fL (11/25/19 06:01:00)    NRBC Percent: 0 % (  11/25/19 06:01:00)    Absolute NRBC Count: 0 thous/mm3 (11/25/19 06:01:00)    Blue Top Tube To Hold: DONE (11/25/19 06:01:00)    Diagnostic Results        ------        Lenox Ponds LINE Electronically signed by Donny Pique MD, [place] on 11/25/2019 at  15:34:30 EST

## 2019-11-24 ENCOUNTER — Ambulatory Visit

## 2019-11-24 HISTORY — PX: ESOPHAGOGASTRODUODENOSCOPY: SHX1529

## 2019-11-24 LAB — HX CBC W/ DIFF
CASE NUMBER: 2021127000338
HX ABSOLUTE NRBC COUNT: 0 10*3/uL
HX HCT: 27.2 % — ABNORMAL LOW (ref 36.0–47.0)
HX HGB: 8.4 g/dL — ABNORMAL LOW (ref 11.8–16.0)
HX MCH: 27.7 pg — NL (ref 26.0–34.0)
HX MCHC: 30.9 g/dL — ABNORMAL LOW (ref 31.0–37.0)
HX MCV: 89.8 fL — NL (ref 80.0–100.0)
HX MPV: 11.3 fL — NL (ref 9.4–12.4)
HX NRBC PERCENT: 0 % — NL
HX PLATELET: 262 10*3/uL — NL (ref 150.0–400.0)
HX RBC: 3.03 10*6/uL — ABNORMAL LOW (ref 3.9–5.2)
HX RDW-CV: 15.1 % — ABNORMAL HIGH (ref 11.5–14.5)
HX RDW-SD: 48.4 fL — NL (ref 35.0–51.0)
HX WBC: 15.9 10*3/uL — ABNORMAL HIGH (ref 3.7–11.2)

## 2019-11-24 LAB — HX HEMOGLOBIN AND HEMATOCRIT
CASE NUMBER: 2021127000595
CASE NUMBER: 21072168
HX HCT: 25.6 % — ABNORMAL LOW (ref 36.0–47.0)
HX HCT: 31 % — ABNORMAL LOW (ref 36.0–47.0)
HX HGB: 8 g/dL — ABNORMAL LOW (ref 11.8–16.0)
HX HGB: 10 g/dL — ABNORMAL LOW (ref 11.8–16.0)

## 2019-11-24 LAB — HX BASIC METABOLIC PANEL
CASE NUMBER: 2021127000338
HX ANION GAP: 4 — NL (ref 3.0–11.0)
HX BUN: 37 mg/dL — ABNORMAL HIGH (ref 8.0–23.0)
HX CALCIUM LVL: 7.8 mg/dL — ABNORMAL LOW (ref 8.5–10.5)
HX CHLORIDE: 115 mmol/L — ABNORMAL HIGH (ref 98.0–110.0)
HX CO2: 22 mmol/L — NL (ref 21.0–32.0)
HX CREATININE: 0.516 mg/dL — ABNORMAL LOW (ref 0.55–1.3)
HX GLUCOSE LVL: 111 mg/dL — ABNORMAL HIGH (ref 70.0–110.0)
HX POTASSIUM LVL: 4.2 mmol/L — NL (ref 3.6–5.2)
HX SODIUM LVL: 141 mmol/L — NL (ref 136.0–146.0)

## 2019-11-24 LAB — HX ABO/RH TYPE
CASE NUMBER: 2021127000576
HX ABO/RH TYPE: A POS — NL

## 2019-11-24 LAB — HX .AUTOMATED DIFF
CASE NUMBER: 2021127000338
HX ABSOLUTE BASO COUNT: 0.03 10*3/uL — NL (ref 0.0–0.22)
HX ABSOLUTE EOS COUNT: 0 10*3/uL — NL (ref 0.0–0.45)
HX ABSOLUTE LYMPHS COUNT: 1.75 10*3/uL — NL (ref 0.74–5.04)
HX ABSOLUTE MONO COUNT: 1.34 10*3/uL — NL (ref 0.0–1.34)
HX ABSOLUTE NEUTRO COUNT: 12.71 10*3/uL — ABNORMAL HIGH (ref 1.48–7.95)
HX BASOPHILS: 0.2 %
HX EOSINOPHILS: 0 %
HX IMMATURE GRANULOCYTES: 0.3 % — NL (ref 0.0–2.0)
HX LYMPHOCYTES: 11 %
HX MONOCYTES: 8.4 %
HX NEUTROPHILS: 80.1 %

## 2019-11-24 LAB — HX ANTIBODY SCREEN
CASE NUMBER: 2021127000576
HX ANTIBODY SCREEN AUTOMATED: NEGATIVE — NL

## 2019-11-24 LAB — HX COVID19 BY PCR (LGH)
CASE NUMBER: 2021126003162
HX COVID19 BY PCR: NOT DETECTED

## 2019-11-24 LAB — HX TROPONIN I
CASE NUMBER: 21071279
HX TROPONIN I: <0.015 — NL (ref 0.015–0.045)

## 2019-11-24 LAB — HX GLOMERULAR FILTRATION RATE (ESTIMATED)
CASE NUMBER: 2021127000338
HX AFN AMER GLOMERULAR FILTRATION RATE: >90
HX NON-AFN AMER GLOMERULAR FILTRATION RATE: 89 mL/min/{1.73_m2}

## 2019-11-24 LAB — HX CROSSMATCH: CASE NUMBER: 2021127000576

## 2019-11-24 LAB — HX PTT
CASE NUMBER: 2021127000338
HX APTT: 25 s — NL (ref 23.0–32.0)

## 2019-11-24 LAB — HX .ABORH CONFIRMATION
CASE NUMBER: 2021127000338
HX ABORH CONFIRM: A POS — NL
HX ANTI-B: 0 — NL

## 2019-11-24 LAB — HX .REDCELL
CASE NUMBER: 2021127001005
HX ADDITIONAL UNITS TO CROSSMATCH: 0 — NL
HX NUMBER OF UNITS TO TRANSFUSE: 1

## 2019-11-24 LAB — HX PT
CASE NUMBER: 2021127000338
HX INR: 1.1
HX PT: 12.2 s — ABNORMAL HIGH (ref 9.3–11.6)

## 2019-11-24 LAB — HX LACTIC ACID
CASE NUMBER: 2021127000224
HX LACTIC ACID LVL: 1.2 mmol/L — NL (ref 0.4–2.0)

## 2019-11-24 LAB — HX NT-PROBNP
CASE NUMBER: 21071276
HX NT-PROBNP: 414 pg/mL — NL (ref 0.0–1800.0)

## 2019-11-25 LAB — HX CBC W/ INDICES
CASE NUMBER: 2021128000146
HX ABSOLUTE NRBC COUNT: 0 10*3/uL
HX HCT: 30.4 % — ABNORMAL LOW (ref 36.0–47.0)
HX HGB: 10.2 g/dL — ABNORMAL LOW (ref 11.8–16.0)
HX MCH: 28.9 pg — NL (ref 26.0–34.0)
HX MCHC: 33.6 g/dL — NL (ref 31.0–37.0)
HX MCV: 86.1 fL — NL (ref 80.0–100.0)
HX MPV: 11.2 fL — NL (ref 9.4–12.4)
HX NRBC PERCENT: 0 % — NL
HX PLATELET: 200 10*3/uL — NL (ref 150.0–400.0)
HX RBC: 3.53 10*6/uL — ABNORMAL LOW (ref 3.9–5.2)
HX RDW-CV: 14.6 % — ABNORMAL HIGH (ref 11.5–14.5)
HX RDW-SD: 45.1 fL — NL (ref 35.0–51.0)
HX WBC: 10.6 10*3/uL — NL (ref 3.7–11.2)

## 2019-11-25 LAB — HX GLOMERULAR FILTRATION RATE (ESTIMATED)
CASE NUMBER: 2021128000146
HX AFN AMER GLOMERULAR FILTRATION RATE: >90
HX NON-AFN AMER GLOMERULAR FILTRATION RATE: >90

## 2019-11-25 LAB — HX URINE CULTURE
CASE NUMBER: 2021126003103
HX F: NO GROWTH

## 2019-11-25 LAB — HX COMPREHENSIVE METABOLIC PANEL
CASE NUMBER: 2021128000146
HX ALBUMIN LVL: 3 g/dL — ABNORMAL LOW (ref 3.2–5.0)
HX ALKALINE PHOSPHATASE: 96 U/L — NL (ref 30.0–117.0)
HX ALT: 13 U/L — NL (ref 6.0–55.0)
HX ANION GAP: 7 — NL (ref 3.0–11.0)
HX AST: 10 U/L — NL (ref 6.0–40.0)
HX BILIRUBIN TOTAL: 0.5 mg/dL — NL (ref 0.2–1.2)
HX BUN: 10 mg/dL — NL (ref 8.0–23.0)
HX CALCIUM LVL: 7.9 mg/dL — ABNORMAL LOW (ref 8.5–10.5)
HX CHLORIDE: 108 mmol/L — NL (ref 98.0–110.0)
HX CO2: 25 mmol/L — NL (ref 21.0–32.0)
HX CREATININE: 0.422 mg/dL — ABNORMAL LOW (ref 0.55–1.3)
HX GLUCOSE LVL: 88 mg/dL — NL (ref 70.0–110.0)
HX POTASSIUM LVL: 3.2 mmol/L — ABNORMAL LOW (ref 3.6–5.2)
HX SODIUM LVL: 140 mmol/L — NL (ref 136.0–146.0)
HX TOTAL PROTEIN: 5.5 g/dL — ABNORMAL LOW (ref 6.0–8.4)

## 2019-11-25 LAB — HX BLUE TOP TO HOLD: CASE NUMBER: 2021128000146

## 2019-11-26 LAB — HX COMPREHENSIVE METABOLIC PANEL
CASE NUMBER: 2021129000135
HX ALBUMIN LVL: 2.8 g/dL — ABNORMAL LOW (ref 3.2–5.0)
HX ALKALINE PHOSPHATASE: 87 U/L — NL (ref 30.0–117.0)
HX ALT: 11 U/L — NL (ref 6.0–55.0)
HX ANION GAP: 5 — NL (ref 3.0–11.0)
HX AST: 8 U/L — NL (ref 6.0–40.0)
HX BILIRUBIN TOTAL: 0.5 mg/dL — NL (ref 0.2–1.2)
HX BUN: 8 mg/dL — NL (ref 8.0–23.0)
HX CALCIUM LVL: 7.8 mg/dL — ABNORMAL LOW (ref 8.5–10.5)
HX CHLORIDE: 108 mmol/L — NL (ref 98.0–110.0)
HX CO2: 26 mmol/L — NL (ref 21.0–32.0)
HX CREATININE: 0.477 mg/dL — ABNORMAL LOW (ref 0.55–1.3)
HX GLUCOSE LVL: 89 mg/dL — NL (ref 70.0–110.0)
HX POTASSIUM LVL: 3 mmol/L — ABNORMAL LOW (ref 3.6–5.2)
HX SODIUM LVL: 139 mmol/L — NL (ref 136.0–146.0)
HX TOTAL PROTEIN: 5.8 g/dL — ABNORMAL LOW (ref 6.0–8.4)

## 2019-11-26 LAB — HX CBC W/ INDICES
CASE NUMBER: 2021129000135
HX ABSOLUTE NRBC COUNT: 0 10*3/uL
HX HCT: 28.6 % — ABNORMAL LOW (ref 36.0–47.0)
HX HGB: 9.8 g/dL — ABNORMAL LOW (ref 11.8–16.0)
HX MCH: 29.3 pg — NL (ref 26.0–34.0)
HX MCHC: 34.3 g/dL — NL (ref 31.0–37.0)
HX MCV: 85.4 fL — NL (ref 80.0–100.0)
HX MPV: 11.1 fL — NL (ref 9.4–12.4)
HX NRBC PERCENT: 0 % — NL
HX PLATELET: 200 10*3/uL — NL (ref 150.0–400.0)
HX RBC: 3.35 10*6/uL — ABNORMAL LOW (ref 3.9–5.2)
HX RDW-CV: 14.3 % — NL (ref 11.5–14.5)
HX RDW-SD: 44.5 fL — NL (ref 35.0–51.0)
HX WBC: 7 10*3/uL — NL (ref 3.7–11.2)

## 2019-11-26 LAB — HX GLOMERULAR FILTRATION RATE (ESTIMATED)
CASE NUMBER: 2021129000135
HX AFN AMER GLOMERULAR FILTRATION RATE: >90
HX NON-AFN AMER GLOMERULAR FILTRATION RATE: >90

## 2019-11-26 LAB — HX BLUE TOP TO HOLD: CASE NUMBER: 2021129000135

## 2019-11-27 LAB — HX CBC W/ INDICES
CASE NUMBER: 2021130000147
HX ABSOLUTE NRBC COUNT: 0 10*3/uL
HX HCT: 30.2 % — ABNORMAL LOW (ref 36.0–47.0)
HX HGB: 9.8 g/dL — ABNORMAL LOW (ref 11.8–16.0)
HX MCH: 28.6 pg — NL (ref 26.0–34.0)
HX MCHC: 32.5 g/dL — NL (ref 31.0–37.0)
HX MCV: 88 fL — NL (ref 80.0–100.0)
HX MPV: 11 fL — NL (ref 9.4–12.4)
HX NRBC PERCENT: 0 % — NL
HX PLATELET: 219 10*3/uL — NL (ref 150.0–400.0)
HX RBC: 3.43 10*6/uL — ABNORMAL LOW (ref 3.9–5.2)
HX RDW-CV: 14.6 % — ABNORMAL HIGH (ref 11.5–14.5)
HX RDW-SD: 46 fL — NL (ref 35.0–51.0)
HX WBC: 6.3 10*3/uL — NL (ref 3.7–11.2)

## 2019-11-27 LAB — HX COMPREHENSIVE METABOLIC PANEL
CASE NUMBER: 2021130000147
HX ALBUMIN LVL: 3 g/dL — ABNORMAL LOW (ref 3.2–5.0)
HX ALKALINE PHOSPHATASE: 85 U/L — NL (ref 30.0–117.0)
HX ALT: 14 U/L — NL (ref 6.0–55.0)
HX ANION GAP: 5 — NL (ref 3.0–11.0)
HX AST: 12 U/L — NL (ref 6.0–40.0)
HX BILIRUBIN TOTAL: 0.3 mg/dL — NL (ref 0.2–1.2)
HX BUN: 4 mg/dL — ABNORMAL LOW (ref 8.0–23.0)
HX CALCIUM LVL: 8.3 mg/dL — ABNORMAL LOW (ref 8.5–10.5)
HX CHLORIDE: 109 mmol/L — NL (ref 98.0–110.0)
HX CO2: 27 mmol/L — NL (ref 21.0–32.0)
HX CREATININE: 0.482 mg/dL — ABNORMAL LOW (ref 0.55–1.3)
HX GLUCOSE LVL: 98 mg/dL — NL (ref 70.0–110.0)
HX POTASSIUM LVL: 3.9 mmol/L — NL (ref 3.6–5.2)
HX SODIUM LVL: 141 mmol/L — NL (ref 136.0–146.0)
HX TOTAL PROTEIN: 5.7 g/dL — ABNORMAL LOW (ref 6.0–8.4)

## 2019-11-27 LAB — HX GLOMERULAR FILTRATION RATE (ESTIMATED)
CASE NUMBER: 2021130000147
HX AFN AMER GLOMERULAR FILTRATION RATE: >90
HX NON-AFN AMER GLOMERULAR FILTRATION RATE: >90

## 2019-11-27 LAB — HX MAGNESIUM LEVEL
CASE NUMBER: 21077091
HX MAGNESIUM LVL: 2.3 mg/dL — NL (ref 1.7–2.5)

## 2019-11-28 LAB — HX CBC W/ INDICES
CASE NUMBER: 2021131000093
HX ABSOLUTE NRBC COUNT: 0 10*3/uL
HX HCT: 29.2 % — ABNORMAL LOW (ref 36.0–47.0)
HX HGB: 9.8 g/dL — ABNORMAL LOW (ref 11.8–16.0)
HX MCH: 29.3 pg — NL (ref 26.0–34.0)
HX MCHC: 33.6 g/dL — NL (ref 31.0–37.0)
HX MCV: 87.4 fL — NL (ref 80.0–100.0)
HX MPV: 11 fL — NL (ref 9.4–12.4)
HX NRBC PERCENT: 0 % — NL
HX PLATELET: 231 10*3/uL — NL (ref 150.0–400.0)
HX RBC: 3.34 10*6/uL — ABNORMAL LOW (ref 3.9–5.2)
HX RDW-CV: 14.7 % — ABNORMAL HIGH (ref 11.5–14.5)
HX RDW-SD: 46.1 fL — NL (ref 35.0–51.0)
HX WBC: 5.8 10*3/uL — NL (ref 3.7–11.2)

## 2019-11-28 LAB — HX COMPREHENSIVE METABOLIC PANEL
CASE NUMBER: 2021131000093
HX ALBUMIN LVL: 3 g/dL — ABNORMAL LOW (ref 3.2–5.0)
HX ALKALINE PHOSPHATASE: 89 U/L — NL (ref 30.0–117.0)
HX ALT: 15 U/L — NL (ref 6.0–55.0)
HX ANION GAP: 3 — NL (ref 3.0–11.0)
HX AST: 8 U/L — NL (ref 6.0–40.0)
HX BILIRUBIN TOTAL: 0.2 mg/dL — NL (ref 0.2–1.2)
HX BUN: 4 mg/dL — ABNORMAL LOW (ref 8.0–23.0)
HX CALCIUM LVL: 8.3 mg/dL — ABNORMAL LOW (ref 8.5–10.5)
HX CHLORIDE: 110 mmol/L — NL (ref 98.0–110.0)
HX CO2: 29 mmol/L — NL (ref 21.0–32.0)
HX CREATININE: 0.485 mg/dL — ABNORMAL LOW (ref 0.55–1.3)
HX GLUCOSE LVL: 93 mg/dL — NL (ref 70.0–110.0)
HX POTASSIUM LVL: 3.6 mmol/L — NL (ref 3.6–5.2)
HX SODIUM LVL: 142 mmol/L — NL (ref 136.0–146.0)
HX TOTAL PROTEIN: 5.7 g/dL — ABNORMAL LOW (ref 6.0–8.4)

## 2019-11-28 LAB — HX BLOOD CULTURE
CASE NUMBER: 2021126003104
CASE NUMBER: 2021126003105
HX F: NO GROWTH
HX F: NO GROWTH

## 2019-11-28 LAB — HX GLOMERULAR FILTRATION RATE (ESTIMATED)
CASE NUMBER: 2021131000093
HX AFN AMER GLOMERULAR FILTRATION RATE: >90
HX NON-AFN AMER GLOMERULAR FILTRATION RATE: >90

## 2019-11-29 LAB — HX GASTRIN LEVEL
CASE NUMBER: 2021129000318
HX GASTRIN: 393 pg/mL — ABNORMAL HIGH

## 2019-12-01 ENCOUNTER — Ambulatory Visit

## 2019-12-02 LAB — HX GASTRIN LEVEL
CASE NUMBER: 2021131000093
HX GASTRIN: 423 pg/mL — ABNORMAL HIGH

## 2019-12-04 ENCOUNTER — Ambulatory Visit: Admitting: Family Medicine

## 2019-12-04 ENCOUNTER — Ambulatory Visit (HOSPITAL_BASED_OUTPATIENT_CLINIC_OR_DEPARTMENT_OTHER): Admitting: Family Medicine

## 2019-12-04 LAB — HX BASIC METABOLIC PANEL
CASE NUMBER: 2021137001792
HX ANION GAP: 5 — NL (ref 3.0–11.0)
HX BUN: 22 mg/dL — NL (ref 8.0–23.0)
HX CALCIUM LVL: 9.1 mg/dL — NL (ref 8.5–10.5)
HX CHLORIDE: 107 mmol/L — NL (ref 98.0–110.0)
HX CO2: 29 mmol/L — NL (ref 21.0–32.0)
HX CREATININE: 0.851 mg/dL — NL (ref 0.55–1.3)
HX GLUCOSE LVL: 123 mg/dL — ABNORMAL HIGH (ref 70.0–110.0)
HX POTASSIUM LVL: 5 mmol/L — NL (ref 3.6–5.2)
HX SODIUM LVL: 141 mmol/L — NL (ref 136.0–146.0)

## 2019-12-04 LAB — HX CBC W/ INDICES
CASE NUMBER: 2021137001792
HX ABSOLUTE NRBC COUNT: 0 10*3/uL
HX HCT: 33.6 % — ABNORMAL LOW (ref 36.0–47.0)
HX HGB: 10.3 g/dL — ABNORMAL LOW (ref 11.8–16.0)
HX MCH: 28.5 pg — NL (ref 26.0–34.0)
HX MCHC: 30.7 g/dL — ABNORMAL LOW (ref 31.0–37.0)
HX MCV: 92.8 fL — NL (ref 80.0–100.0)
HX MPV: 11.5 fL — NL (ref 9.4–12.4)
HX NRBC PERCENT: 0 % — NL
HX PLATELET: 361 10*3/uL — NL (ref 150.0–400.0)
HX RBC: 3.62 10*6/uL — ABNORMAL LOW (ref 3.9–5.2)
HX RDW-CV: 14.5 % — NL (ref 11.5–14.5)
HX RDW-SD: 47.6 fL — NL (ref 35.0–51.0)
HX WBC: 11 10*3/uL — NL (ref 3.7–11.2)

## 2019-12-04 LAB — HX GLOMERULAR FILTRATION RATE (ESTIMATED)
CASE NUMBER: 2021137001792
HX AFN AMER GLOMERULAR FILTRATION RATE: 73 mL/min/{1.73_m2}
HX NON-AFN AMER GLOMERULAR FILTRATION RATE: 63 mL/min/{1.73_m2}

## 2019-12-04 NOTE — Progress Notes (Signed)
* * *      [name], [name] **DOB:** 09-May-1935 (85 yo F) **Acc No.** [id number] **DOS:**  12/04/2019    ---        [name], [name]**    ------    66 Y old Female, DOB: Oct 22, 1934    [address], [place], [place] [address]    Home: [phone]    Provider: Cheryle Horsfall        * * *    Telephone Encounter    ---    Answered by    Cheryle Horsfall    Date: 12/04/2019        Time: 05:53 PM    Reason    results    ------            Message                      labs:   hct: 33                 Action Taken                      [name] 12/04/2019 5:53:35 PM > reported.                     * * *                ---          * * *          Provider: Cheryle Horsfall 12/04/2019    ---    Note generated by eClinicalWorks EMR/PM Software ([email or web address])

## 2019-12-04 NOTE — Progress Notes (Signed)
* * *      [place], [place] **DOB:** May 14, 1935 (85 yo F) **Acc No.** [id number] **DOS:**  12/04/2019    ---        [name], [name]**    ------    48 Y old Female, DOB: June 02, 1935    [address] [place], [place] [address]    Home: [phone]    Provider: Cheryle Horsfall        * * *    Telephone Encounter    ---    Answered by    Cheryle Horsfall    Date: 12/04/2019        Time: 11:29 AM    Action Taken                      Cheryle Horsfall 12/04/2019 11:31:42 AM > erx done        ------            Refills    Start pantoprazole delayed release tablet, 40 mg, orally, 60, 1  tab(s), bid, Refills=1    ------      Start famotidine tablet, 20 mg, orally, 60, 1 tab(s), 2 times a day,  Refills=1          * * *                ---          * * *          Provider: Cheryle Horsfall 12/04/2019    ---    Note generated by eClinicalWorks EMR/PM Software ([email or web address])

## 2019-12-04 NOTE — Progress Notes (Signed)
STEFFANY, SCHOENFELDER **DOB:** 1935/03/15 (85 yo F) **Acc No.** [id number] **DOS:**  12/04/2019    ---      ** Progress Notes  **    ---    **Patient:** Elaine, Garcia    **Account Number:** [id number]    **Provider:** Electa Sniff, M.D    **DOB:** 09/15/1934  **Age:** 85 Y  **Sex:** Female    **Date:** 12/04/2019    **Phone:** [phone]    **Address:** [address] [place], [id number]        * * *        **Subjective:**        ---      **Chief Complaints:**    ------      1\. Hosp f/u for sepsis and vomiting D/C 5/11 per [facility] to fax  all notes and D/C summary.. 2. In [facility].    ------      **HPI:**    _Follow-up_ :    Here in follow up. had abdominal pain, vomiting last week. no fever. went to  [facility] and dx with PUD and gastric outlet obstruction. upper endoscopy showed  large duodenal ulcer. surgeons tx conservatively with PPI and pepcid. had  fluids, blood 1 unit, antibiotics. now feeling better. no fever. no pain.  bowels moving.    _Face-To-Face Transitional Care Visit_ :    Initial transitional care contact was made on: What was the original date of  contact with the patient regarding discharge from the hospital? 11/24/2019.  Medication Reconcilation: Has the medication list been reconciled with the  discharge summary? Yes. Medication list given to patient: Has the new  medication list been given to the patient/family or caregiver? Yes. Advanced  Directive Does the patient have an advanced directive signed and on file with  the office? Yes. Referrals: Does the patient need any referrals for follow-up  care for this hospital discharge Yes. Community resources identified for  patient/family: Does the patient require a community assistance program? No.  Durable medical equipment ordered: Does the patient require durable medical  equipment? No. Additional communication delivered or planned: Has additional  communication been delivered or planned? Yes, Who has or will it been  communicated with?  Family/caregiver. Patient education: Have handouts been  given to the patient on any newly diagnosed conditions? No.    ------      **ROS:**    _CONSTITUTIONAL_ :    Unexplained weight change  none  .  no  Fever.  no  Chills.    _OPTHALMOLOGY_ :    Glasses or contacts  none  .    _ENT_ :    Dental care  up-to-date  .  no  Rhinorrhea. Hoarseness  none  . Swollen Lymph  Nodes  none  .    _ALLERGY_ :    Environmental Allergies  none  .    _RESPIRATORY_ :    Shortness of Breath  none  . Persistent Cough  none  .    _CARDIOLOGY_ :    Chest Pain  none  . Palpitations  none  . Leg Edema  none  . Exertional  symptoms  none  .    _GASTROENTEROLOGY_ :    no  Heartburn.    _FEMALE REPRODUCTIVE_ :    Hot Flashes  none  . Abnormal Vaginal Discharge  none  . Dyspareunia  none  .  Breast lump  none  . Practices SBE  regulary  . Vaginal dryness  none  .  _UROLOGY_ :    Blood in Urine  none  . Urinary Incontinence  none  . Nocturia  None  .  Dysuria  none  .    _MUSCULOSKELETAL_ :    Positive for  OA of hands  .    _NEUROLOGY_ :    Positive for  neuropathy of toes  . Headaches  none  . Weakness  none  .  Tingling/Numbness  none  . Visual Changes  none  . Dizziness  none  .    _PSYCHOLOGY_ :    c/o  Sleep Disturbances.    _DERMATOLOGY_ :    New or changing skin lesions  none  . Positive for  hx of basal cell cancer  .    _HEMATOLOGY/LYMPH_ :    Bleeding tendencies  none  .        ------      **Medical History:** PUD, Carcinoid tumor, Insomnia, Hypothyroid, Basal  cell cancer.        ------      **Social History:** no Tobacco Use . Seat belt: yes. no Alcohol. no Drug  use. Diet/Nutrition: Two meals a day. Exercise: yes. Marital Status: widowed.  no Domestic violence. Children: 3. Number of household members: 5. Occupation:  Retired Engineer, maintenance (IT). Occup. exposure: none. no Travel outside [place]. no  Caffeine. Home smoke detector use: yes. no Pets.    ------      **Medications:** Taking amitriptyline 50 mg tablet 1-2 tabs orally  qhs,  Taking levothyroxine 125 mcg (0.125 mg) tablet 1 tab(s) orally qd, Taking  famotidine 20 mg tablet orally bid, Taking Protonix 40 mg delayed release  tablet orally bid    ------      **Allergies:** N.K.D.A.    ------        **Objective:**        ---      **Vitals:** BP **110/76** , Temp **98.7** , Ht 63, Wt **109** , Wt Change  -2 lb, BMI **19.31** .    ------      **Examination:    ** _General Examination:_    General  thin. no dementia  .    HEENT:  clear conjunctiva  ,  TM's clear and flat bilaterally  ,  nose clear  ,  oropharynx clear with MMM  .    Oral cavity:  clear  ,  moist mucus membranes  .    Heart:  RRR  ,  no murmurs  .    Lungs:  clear to auscultation bilaterally  ,  no wheezes/rhonchi/rales  .    Abdomen:  soft, NT/ND, BS present  ,  no masses palpated  ,  no  hepatosplenomegaly  .    Back:  normal ROM of spine  .    Extremities  no clubbing, no edema  .    Peripheral pulses:  normal (2+) bilaterally  .    Skin:  healed scars from abdominal surgery last year.  .    Neurologic Exam:  numbness of toes.  Marland Kitchen        ------            **Assessment:**        ---      **Assessment:**        1\. Peptic ulcer - K27.9 (Primary)    2\. GI bleed - K92.2    3\. Anemia due to blood loss - D50.0    4\. 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 Peptic ulcer**    _LAB: Basic Metabolic Panel_ 123      Value    Reference Range    ---------    Creatinine    0.851      0.550-1.300 - mg/dL    Sodium Lvl    883      136-146 - mmol/L    ------------    Potassium Lvl    5.0      3.6-5.2 - mmol/L    ------------    Chloride    107      98-110 - mmol/L    ------------    CO2    29      21-32 - mmol/L    ------------    Calcium Lvl    9.1      8.5-10.5 - mg/dL    ------------    Glucose Lvl    123    H    70-110 - mg/dL    ------------    Anion Gap    5      3-11 -    ------------    BUN    22      8-23 - mg/dL     ------------    _LAB: CBC w/ Indices_ 33.6    Value    Reference Range    ---------    WBC    11.0      3.7-11.2 - thous/mm3    RBC    3.62    L    3.90-5.20 - Mil/mm3    ------------    Hgb    10.3    L    11.8-16.0 - Gm/dL    ------------    Hct    33.6    L    36.0-47.0 - %    ------------    MCV    92.8      80.0-100.0 - fL    ------------    MCH    28.5      26.0-34.0 - pGm    ------------    MCHC    30.7    L    31.0-37.0 - Gm/dL    ------------    Platelet    361      150-400 - thous/mm3    ------------    RDW-SD    47.6      35.0-51.0 - fL    ------------    MPV    11.5      9.4-12.4 - fL    ------------        Notes: presented last week with GI bleed and gastric outlet obstruction. now  on PPI bid and pepcid bid. will follow with GI for repeat endoscopy. check  hct. avoid dietary triggers.    **2\. GI bleed**    Notes: check hct. iron supplements. on PPI and pepcid.    **3\. Anemia due to blood loss**    Notes: received 1 unit or PRBCs.    **4\. Hx of carcinoid syndrome**    Notes: had partial bowel resection last year for this.      **Follow Up:** 4 Weeks, prn    ------    ---    ---                ---    Electronically signed by Electa Sniff , MD on 12/05/2019 at 07:51 AM EDT  Sign off status: Completed          * * *      **Provider:** Electa Sniff, M.D    **Date:** 12/04/2019    ------

## 2019-12-11 ENCOUNTER — Ambulatory Visit

## 2019-12-13 ENCOUNTER — Ambulatory Visit

## 2019-12-14 ENCOUNTER — Ambulatory Visit (HOSPITAL_BASED_OUTPATIENT_CLINIC_OR_DEPARTMENT_OTHER): Admitting: Family Medicine

## 2019-12-14 ENCOUNTER — Ambulatory Visit

## 2019-12-14 NOTE — Progress Notes (Signed)
* * *      [name], [name] **DOB:** May 24, 1935 (85 yo F) **Acc No.** [id number] **DOS:**  12/14/2019    ---        [name], [name]**    ------    58 Y old Female, DOB: 11/15/1934    [address], [place], [place] [address]    Home: [phone]    Provider: Cheryle Horsfall        * * *    Telephone Encounter    ---    Answered by    Tori Milks    Date: 12/14/2019        Time: 01:44 PM    Reason    [facility] records received    ------            Message                      A large amount of medical records were received today and sent to be scanned.                Action Taken                      [place], LPN ,Paulette  9/73/5329 1:58:00 PM > ok noted                    * * *                ---          * * *          Provider: Cheryle Horsfall 12/14/2019    ---    Note generated by eClinicalWorks EMR/PM Software ([email or web address])

## 2019-12-23 ENCOUNTER — Ambulatory Visit (HOSPITAL_BASED_OUTPATIENT_CLINIC_OR_DEPARTMENT_OTHER): Admitting: Family Medicine

## 2019-12-23 ENCOUNTER — Ambulatory Visit

## 2019-12-23 NOTE — Progress Notes (Signed)
* * *      [name], [name] **DOB:** January 29, 1935 (85 yo F) **Acc No.** [id number] **DOS:**  12/23/2019    ---        [name], [name]**    ------    82 Y old Female, DOB: 12-02-1934    [address] [place], [place] [address]    Home: [phone]    Provider: Cheryle Horsfall        * * *    Telephone Encounter    ---    Answered by    Holli Humbles    Date: 12/23/2019        Time: 11:14 AM    Message                      12/23/19   SCANNED MED REC         ------                * * *                ---          * * *          Provider: Cheryle Horsfall 12/23/2019    ---    Note generated by eClinicalWorks EMR/PM Software ([email or web address])

## 2020-02-06 ENCOUNTER — Ambulatory Visit (HOSPITAL_BASED_OUTPATIENT_CLINIC_OR_DEPARTMENT_OTHER): Admitting: Family Medicine

## 2020-02-06 NOTE — Progress Notes (Signed)
* * *      [name], [name] **DOB:** 31-May-1935 (85 yo F) **Acc No.** [id number] **DOS:**  02/06/2020    ---        [name], [name]**    ------    38 Y old Female, DOB: 1934-10-31    [address], [place], [place] [address]    Home: [phone]    Provider: Cheryle Horsfall        * * *    Telephone Encounter    ---    Answered by    [place] LPN, Paulette    Date: 02/06/2020        Time: 12:17 PM    Message                      Daughter is calling has some questions after the endoscopy she had  They want to schedule surgery.  pls call  [name] [phone]        ------            Action Taken                      Cheryle Horsfall 02/06/2020 12:26:43 PM >  called.  to see surgeon for duodenal ulcer                    * * *                ---          * * *          Provider: Cheryle Horsfall 02/06/2020    ---    Note generated by eClinicalWorks EMR/PM Software ([email or web address])

## 2020-02-08 ENCOUNTER — Ambulatory Visit (HOSPITAL_BASED_OUTPATIENT_CLINIC_OR_DEPARTMENT_OTHER)

## 2020-02-14 ENCOUNTER — Ambulatory Visit: Admitting: Surgery

## 2020-02-20 ENCOUNTER — Inpatient Hospital Stay: Admit: 2020-02-20 | Disposition: A | Discharge status: Home Health | Source: Ambulatory Visit

## 2020-02-20 HISTORY — PX: LYSIS OF ADHESIONS: SHX21136

## 2020-02-20 LAB — HX CBC W/ DIFF
CASE NUMBER: 2021215001297
HX ABSOLUTE NRBC COUNT: 0 10*3/uL
HX HCT: 30.4 % — ABNORMAL LOW (ref 36.0–47.0)
HX HGB: 9.3 g/dL — ABNORMAL LOW (ref 11.8–16.0)
HX MCH: 23.2 pg — ABNORMAL LOW (ref 26.0–34.0)
HX MCHC: 30.6 g/dL — ABNORMAL LOW (ref 31.0–37.0)
HX MCV: 75.8 fL — ABNORMAL LOW (ref 80.0–100.0)
HX MPV: 11.2 fL — NL (ref 9.4–12.4)
HX NRBC PERCENT: 0 % — NL
HX PLATELET: 273 10*3/uL — NL (ref 150.0–400.0)
HX RBC: 4.01 10*6/uL — NL (ref 3.9–5.2)
HX RDW-CV: 15.2 % — ABNORMAL HIGH (ref 11.5–14.5)
HX RDW-SD: 41.9 fL — NL (ref 35.0–51.0)
HX WBC: 6.1 10*3/uL — NL (ref 3.7–11.2)

## 2020-02-20 LAB — HX .AUTOMATED DIFF
CASE NUMBER: 2021215001297
HX ABSOLUTE BASO COUNT: 0.03 10*3/uL — NL (ref 0.0–0.22)
HX ABSOLUTE EOS COUNT: 0.24 10*3/uL — NL (ref 0.0–0.45)
HX ABSOLUTE LYMPHS COUNT: 1.3 10*3/uL — NL (ref 0.74–5.04)
HX ABSOLUTE MONO COUNT: 0.69 10*3/uL — NL (ref 0.0–1.34)
HX ABSOLUTE NEUTRO COUNT: 3.85 10*3/uL — NL (ref 1.48–7.95)
HX BASOPHILS: 0.5 %
HX EOSINOPHILS: 3.9 %
HX IMMATURE GRANULOCYTES: 0.3 % — NL (ref 0.0–2.0)
HX LYMPHOCYTES: 21.2 %
HX MONOCYTES: 11.3 %
HX NEUTROPHILS: 62.8 %

## 2020-02-20 LAB — HX ABO/RH TYPE
CASE NUMBER: 2021215002228
HX ABO/RH TYPE: A POS — NL

## 2020-02-20 LAB — HX GLOMERULAR FILTRATION RATE (ESTIMATED)
CASE NUMBER: 2021215001297
HX AFN AMER GLOMERULAR FILTRATION RATE: >90
HX NON-AFN AMER GLOMERULAR FILTRATION RATE: 86 mL/min/{1.73_m2}

## 2020-02-20 LAB — HX BASIC METABOLIC PANEL
CASE NUMBER: 2021215001297
HX ANION GAP: 4 — NL (ref 3.0–11.0)
HX BUN: 21 mg/dL — NL (ref 8.0–23.0)
HX CALCIUM LVL: 8.4 mg/dL — ABNORMAL LOW (ref 8.5–10.5)
HX CHLORIDE: 110 mmol/L — NL (ref 98.0–110.0)
HX CO2: 25 mmol/L — NL (ref 21.0–32.0)
HX CREATININE: 0.577 mg/dL — NL (ref 0.55–1.3)
HX GLUCOSE LVL: 96 mg/dL — NL (ref 70.0–110.0)
HX POTASSIUM LVL: 4.9 mmol/L — NL (ref 3.6–5.2)
HX SODIUM LVL: 139 mmol/L — NL (ref 136.0–146.0)

## 2020-02-20 LAB — HX ANTIBODY SCREEN
CASE NUMBER: 2021215002228
HX ANTIBODY SCREEN AUTOMATED: NEGATIVE — NL

## 2020-02-20 NOTE — Progress Notes (Signed)
 Name :  [name], [name]    DOB :  [id number]    Sex :  Female    MRN :  [id number]    Subjective    "I feel nauseous and haven't ate anything" Stomach upset in morning    Review of Systems    All systems have been reviewed and are negative unless identified in the HPI.    Objective    Vitals & Measurements    **T: **98.3 F (Oral) **TMIN: **98.3 F (Oral) **TMAX: **99.0 F (Oral) **HR:  **99(Peripheral) **RR: **18 **BP: **154/61 **BP: **183/80(Sitting) **BP:  **132/60(Standing) **BP: **168/78(Supine) **SpO2: **97% **WT: **55.1 Kg    Physical Exam    General: Alert and oriented, some forgetfulness. No acute distress    Eye: PERRL, no visual disturbances    HEENT: Normocephalic, normal hearing, no sinus tenderness    Neck: supple, no JVD    Respiratory: Lungs clear to auscultation, respirations non-labored    Cardiovascular: Normal Rate, normal peripheral perfusion, no edema.    Gastrointestinal: Abdomen soft, non tender, non-distended, + flatus and BS. BM  x 1    Genitourinary: No CVA tenderness, no dysurea    Musculoskeletal: MAE, normal ROM, generalized weakness.    Integumentary: Skin intact, warm, pink, dry. No pallor, no rash.    Neurologic: Alert, Oriented x 3, forgetful, normal sensory, no focal deficits.  Gag reflex normal. Uses RW for ambulation.    Cognition and Speech: Speech clear and coherent, functional cognition intact    Psychiatric: Cooperative, appropriate mood & affect [1]    Medications    _Inpatient_    amitriptyline, 100 mg= 2 tab(s), PO, HS    amLODIPine, 5 mg= 1 tab(s), PO, Daily    heparin, 5000 Unit(s)= 1 mL, sc, q8hr    levothyroxine, 125 mcg= 1 tab(s), PO, Daily    morPHINE, 2 mg= 1 mL, IV Push, q2hr, PRN    Norco 5 mg-325 mg oral tablet, 1 tab(s), PO, q4hr, PRN    Ofirmev, 1000 mg= 100 mL, IV Piggyback, q6hr, PRN    ondansetron, 4 mg= 2 mL, IV Push, q6hr, PRN    Protonix, 40 mg= 1 EA, IV Push, q24hr    Reglan, 10 mg= 2 mL, IV Push, q6hr, PRN    Tylenol 500 mg oral tablet, 1000 mg= 2  tab(s), PO, q8hr    Lab Results    Glucose Lvl: 100 mg/dL (19/14/78 29:56:21)    BUN: 11 mg/dL (30/86/57 84:69:62)    Creatinine: 0.503 mg/dL Low (95/28/41 32:44:01)    Afn Amer Glomerular Filtration Rate: >90 (02/28/20 06:19:00)    Non-Afn Amer Glomerular Filtration Rate: 90 ml/min/1.60m2 (02/28/20 06:19:00)    Sodium Lvl: 136 mmol/L (02/28/20 06:19:00)    Potassium Lvl: 4.2 mmol/L (02/28/20 06:19:00)    Chloride: 102 mmol/L (02/28/20 06:19:00)    CO2: 27 mmol/L (02/28/20 06:19:00)    Anion Gap: 7 (02/28/20 06:19:00)    Calcium Lvl: 9.3 mg/dL (02/72/53 66:44:03)    Lav Top Tube to Hold: DONE (02/28/20 06:19:00)    Diagnostic Results    AB/Pelvic CT - 02/24/2020    (02/24/2020 19:40 EDT CT Abdomen and Pelvis C+)    FINDINGS:        ABDOMEN    Lower chest:Please refer to separately dictated chest CT report of same date  for    evaluation of thoracic findings.    Hepatobiliary: There are some subcentimeter hypodensities which are too small  to    characterize but likely represent  small cysts or hamartomas. No concerning  liver    lesion is seen. The portal vein is patent.    Gallbladder: Gallbladder is distended and mildly thick-walled.    Pancreas: Homogeneous in density, with no focal masses or ductal dilation    identified.    Spleen: Homogeneous in density, with no focal lesions identified.    Adrenals: Normal in size and configuration, with no discrete nodules  identified.        Kidneys/Ureters: Mild fullness of the left extrarenal pelvis, similar to  prior.    Subcentimeter left renal hypodensities too small to characterize but    statistically likely to represent small cysts. No focal concerning right renal    lesion. No right-sided hydronephrosis. Minimal thickening of the wall of right    renal pelvis [image 45].    Vessels: Abdominal aorta and IVC are normal in caliber with marked    atherosclerotic calcification of the aorta.    Lymph nodes: No pathologic lymphadenopathy.    Retroperitoneum: Normal.     Gastrointestinal tract: Postsurgical changes of distal gastrectomy and right    hemicolectomy. Mild fat stranding and minimal gas locules are seen adjacent to    the oversewn second portion of duodenum. There is a surgical drain in place in    this region. No circumscribed fluid collection. There is some fat stranding of    the greater omentum which may be inflammatory and/or sequela of recent  surgery.    There is minimal gas likely intraperitoneal deep to a ventral incision in the    midline and also some gas along the right upper abdominal wall. There is  colonic    diverticulosis but no evidence of diverticulitis.        PELVIS:    Pelvic organs: Uterus demonstrates calcifications compatible with fibroids.    There is gas in the bladder likely related to recent catheterization. No  pelvic    fluid collection.    Bones: There are degenerative changes in the spine. There is superior endplate    compression at T11, unchanged. Tiny area of subchondral sclerosis at the  medial    right femoral head may be degenerative in nature or sequela of avascular    necrosis [series 2, image 199], unchanged. There is dextroconvex scoliosis in    the lumbar spine.        Minimal gas noted in the posterior right upper arm possibly sequela of soft    tissue injury or venous cannulation [series 1, image 2].        IMPRESSION:    Postsurgical changes of distal gastrectomy. Small amount of fluid and gas at  the    operative site without circumscribed collection with surgical drain in place.    There is a stranding of the greater omentum which may be inflammatory or  sequela    of recent instrumentation.    There is distention and mild wall thickening of the gallbladder. Cholecystitis    is not excluded. Further evaluation with ultrasound is suggested.    Postsurgical changes of right hemicolectomy.    Colonic diverticulosis without evidence of diverticulitis.    Minimal thickening of the right renal pelvis may be reactive or  inflammatory.    Correlation with urinalysis is recommended.    Minimal gas noted in the posterior right upper arm possibly sequela of soft    tissue injury or venous cannulation.    Please see separate CT chest report same day. [1]  Chest CTA - 02/24/2020    (02/24/2020 19:40 EDT CT Angiography Chest)    FINDINGS:        CARDIOVASCULAR: There is a good contrast bolus. There is no intra-arterial    filling defect to suggest a pulmonary embolus. There is no pericardial  effusion.    There is apparent left ventricular hypertrophy. There are mild coronary artery    atheromatous calcifications.        AXILLA: No lymphadenopathy.        MEDIASTINUM: No lymphadenopathy. No mass.        HILA: No lymphadenopathy. No mass.        INCLUDED UPPER ABDOMEN: Reported separately        LUNGS: There is mild dependent atelectasis in both lung bases, left greater  than    right. There is no discrete pneumonia or mass. There is mild biapical  scarring.        MAIN AIRWAYS: Patent.        PLEURA: No pneumothorax. No pleural effusions.        OSSEOUS STRUCTURES/SOFT TISSUES: No significant abnormality.        LINES/TUBES: Nasogastric tube in place.        IMPRESSION:    No pulmonary embolus.        Mild dependent atelectasis in both lung bases, left greater than right.    [2]        Operative Report - 02/20/2020    PREOPERATIVE DIAGNOSIS:  Peptic ulcer disease/gastric outlet obstruction.        POSTOPERATIVE DIAGNOSIS:  Peptic ulcer disease/gastric outlet obstruction.        PROCEDURES:    1\.  Antrectomy/partial gastrectomy with Billroth II gastrojejunostomy.    2\.  Extensive lysis of adhesions, modifier 22.        SURGEON:  Beverly Milch, M.D. [3]        Impression and Plan    Acute PUD    - S/P Antrectomy by Dr. Donny Pique on 8/03 - POD#7    - Tolerating foods intermittently    - General surgery managing        # Hypertension - Better control    - Was not taking any meds at home    - Continue PO Amlodipine on 8/09 - Hold for B/P  < 120    - Monitor B/P \- Follow up with PCP        # Altered MS - Had fall and on AB CT showed mild thickening of R renal pelvis  - correlate with Urinalysis    - Low grade temp in afternoon today    - No Leukocytosis.    - U/A negative for pyuria            Dispo: - POD #7 antrectomy - General surgery managing . B/P in better range -  continue amlodipine - follow up with PCP - B/P monitoring. Rolling walker  prescription for ambulation.    Thank you for your consult - will sign off since being discharged        FACE TO FACE PATIENT COUNSELLING/COORDINATING CARE MORE THAN 50% OF ENCOUNTER  TIME: YES        TOTAL ENCOUNTER TIME: > 30 MINUTES    [1] Consult Note; Emmarie Sannes NP, Shrihan Putt 02/27/2020 11:00 EDT    SIGNATURE LINE Electronically signed by Brynda Peon MD, Seema on 03/09/2020 at  22:42:43 EST

## 2020-02-20 NOTE — Op Note (Signed)
cc: Rebbeca Paul, M.D.  ____________________________________________________________    OPERATIVE REPORT  DATE:  02/20/2020    PRIMARY CARE PHYSICIAN:  Electa Sniff, M.D.    REFERRING PHYSICIAN:  Rebbeca Paul, M.D.    PREOPERATIVE DIAGNOSIS:  Peptic ulcer disease/gastric outlet obstruction.    POSTOPERATIVE DIAGNOSIS:  Peptic ulcer disease/gastric outlet obstruction.    PROCEDURES:  1.  Antrectomy/partial gastrectomy with Billroth II gastrojejunostomy.  2.  Extensive lysis of adhesions, modifier 22.    SURGEON:  Beverly Milch, M.D.    ASSISTANT:   None.    ANESTHESIA:  General.    COMPLICATIONS:  None.    DRAINS:  A 19-French Blake drain.    ESTIMATED BLOOD LOSS:  20 ML.    INDICATIONS:  The patient had a perforated peptic ulcer disease, treated with a   Cheree Ditto patch in [place].    In the past,  she also had a right colectomy for   appendiceal carcinoid.  She presented with a GI bleed secondary to a near   obstructing peptic ulcer.  She was started on medical therapy; however, a repeat  endoscopy done by Dr. Elesa Hacker two weeks ago reveals a persistent ulcer with a   pencil tip lumen could not be passed endoscopically.    I personally met with the patient this morning.  I discussed the risk of the   operation including possibility of infection, bleeding, leaking, possible need   for further surgery.  She understood and wished to proceed.    DESCRIPTION OF PROCEDURE:  The patient was taken to the Operating Room and put   under general anesthesia.  NG tube and Foley catheter were inserted.  The   abdomen was prepped and draped in the usual sterile manner.  Abdomen was prepped  and draped in the usual sterile manner.  An upper midline incision was made   through the same scar.  It was taken down through skin and subcutaneous tissue.   Fascia was opened.  Extensive adhesions were immediately noted with bowel and   omentum.  We took our time slowly and meticulously with sharp dissection until   the posterior  abdominal wall was completely freed.  The bowel was inspected.  No  enterotomies were seen or made.   One serosal tear on the small bowel was   repaired with a figure-of-eight Vicryl suture.    The stomach was stuck on the liver.  Adhesions were taken down.  The antrum of   the stomach was completely freed.  A pyloric channel and first portion of the   duodenum were mobilized.  The near obstructing ulcer was immediately noted just   distal to the pylorus.  The stomach was transected at the level of the antrum   from the greater curvature to the lesser curvature.  Dissection proceeded then   distally, freeing of the stomach posteriorly.  As we got to the pylorus, there   were dense adhesions and we took our time with blunt dissection as well as   dissection with the LigaSure.  We finally passed the near obstructing ulcer   distally.  The duodenum was then transected using a blue GIA.  The staple line   was Lamberted with a running PDS.  The specimen was passed.    Attention was then switched toward the gastrojejunostomy.  The jejunum was   easily noted.    Starting at the ligament of Treitz, a loop of jejunum was then   brought up to the stomach.  Stay sutures were placed between the jejunum and the  stomach.  Two enterotomies were made.  The anastomosis was then made with a blue  GIA.  It was closed with a TA.    The closure was reinforced with Lamberted   running PDS suture.  There was no narrowing whatsoever of the lumen of the   jejunum.  The NG tube was then tunneled toward the efferent limb.    Bowel and omentum were placed back in their anatomic position.  Bowel was again   inspected and there were no enterotomies were seen or made.  We made a conscious  decision not to do a vagotomy on this patient.  Twenty mL of Exparel as well as   30 of Marcaine was then mixed with 20 mL of normal saline.  This was then   injected throughout the fascia.  A 19-French Blake drain was then placed running  over the duodenal  stump and toward the anastomosis and extended toward the   adnexa in the right upper quadrant and secured with nylon suture.  Fascia was   closed with running looped PDS.  Skin was closed with staples.  A silver   dressing was applied.  Drain gauze was placed around the JP.  Needle, sponge,   and instrument counts were correct at the end of the procedure.  The patient   tolerated the procedure very well.  She was awakened from anesthesia and   transferred to the Recovery Room in stable condition.    Dictated by:  Beverly Milch, M.D.    DD: 02/20/2020 15:54:40  DT: 02/22/2020 08:34:00  WM/tam  Job #: [id number]   SIGNATURE LINE    Electronically signed by Donny Pique MD, [place] on 03/18/2020 at 14:22:36 EST

## 2020-02-20 NOTE — Consults (Signed)
Name :  [name], [name]    DOB :  [id number]    Sex :  Female    MRN :  [id number]    Chief Complaint    surgery on stomach ulcer    Reason for Consultation    downgrading bed    Physician Requesting Consult    Earle    History of Present Illness    Pt doing well. Hospitalist consulted to determine if pt could be downgraded  after being transferred to tele after a rapid response was called yesterday.  Per documentation the rapid call after patient fell in bathroom and found with  low oxygen saturation and slurred speech. After which she rapidly improved  clinically. Rpt labwork was obtained as well as a CT brain, CTA chest and CT  abdomen pelvis, which all returned WNL in light of recent surgical procedure.  Pt seen today, but per pt states that while trying to get to the bathroom she  may have starting urinating and after finishing she may have slipped on the  wet floor. There was no LOC, she tried to stand on her own, but had difficulty  getting up and rang the bell. She has been without any additional events since  yesterday and feels fine.    Review of Systems    Review of system is negative other than what is stated in HPI    Code Status    Code Status - Ordered    -- 02/23/20 7:06:00 EDT, Full Resuscitation, Constant Order    Physical Exam    Vitals & Measurements    **T: **98.1 F (Oral) **TMIN: **97.6 F (Oral) **TMAX: **98.6 F (Oral) **HR:  **102 **RR: **20 **BP: **152/80 **SpO2: **98% **WT: **55.1 Kg    General: Alert and oriented, no acute distress    Eye: Normal Conjunctiva    HEENT: Normocephalic, normal hearing    Respiratory: Lungs clear to auscultation, respirations non-labored, breath  sounds equal and regular    Cardiovascular: Normal Rate, normal rhythm, no gallop, normal peripheral  perfusion, no edema.    Gastrointestinal: Abdomen soft, non tender, non-distended, normal bowel sounds  all four quadrants, no organomegaly    Musculoskeletal: normal ROM, normal strength    Integumentary: Skin  intact, warm, pink, dry. No pallor, no rash.    Neurologic: Alert, Oriented, no focal deficits.    Cognition and Speech: Oriented, speech clear and coherent, functional  cognition intact, expression WNL    Psychiatric: Cooperative, appropriate mood & affect, normal judgment, non-  suicidal    Impression and Plan    Vitals normal except hypertension. Exam normal. Cleared to be transferred to  an [facility] floor.    NGT replaced but pt voices that she had be consuming PO and desires to eat  without it. Per nurse only 50 cc since this morning with breakfast. Advised to  follow up with surgery but if unable to reach would monitor outpt until 5 and  if <200, can pull NGT.    Would continue to monitor BPs during admission. Per pt not on anything at  home. Would consider adding an anti-hypertensive if without continual gradual  improvement by tomorrow.        Problem List/Past Medical History    Ongoing    Fall risk    Historical    No qualifying data    Procedure/Surgical History      * Gastrojejunostomy (N/A) (02/20/2020)    * EGD with Anesthesia (N/A) (11/24/2019)    *  ulcer repair (07/21/2019)    * partial colectomy (01/18/2019)    Social History    Family History    Allergies    NKA    Medications    _Inpatient_    amitriptyline, 100 mg= 2 tab(s), PO, HS    heparin, 5000 Unit(s)= 1 mL, sc, q8hr    levothyroxine, 125 mcg= 1 tab(s), PO, Daily    morPHINE, 2 mg= 1 mL, IV Push, q2hr, PRN    Norco 5 mg-325 mg oral tablet, 1 tab(s), PO, q4hr, PRN    NS 1,000 mL, 1000 mL, IV    Ofirmev, 1000 mg= 100 mL, IV Piggyback, q6hr, PRN    ondansetron, 4 mg= 2 mL, IV Push, q6hr, PRN    Protonix, 40 mg= 1 EA, IV Push, q24hr    Reglan, 10 mg= 2 mL, IV Push, q6hr, PRN    Tylenol 500 mg oral tablet, 1000 mg= 2 tab(s), PO, q8hr    _Home_    amitriptyline 50 mg oral tablet, 100 mg= 2 tab(s), PO, HS, 1-2 tabs    levothyroxine 125 mcg (0.125 mg) oral tablet, 125 mcg= 1 tab(s), PO, Daily    Protonix, 40 mg, PO, BID    Diet    NPO - Ordered    --  02/25/20 13:08:00 EDT, until 8/8 5pm pending NGT output then adv to  clears, 02/25/20 13:08:00 EDT    Lab Results    Glucose Lvl: 85 mg/dL (34/74/25 95:63:87)    BUN: 10 mg/dL (56/43/32 95:18:84)    Creatinine: 0.506 mg/dL Low (16/60/63 01:60:10)    Afn Amer Glomerular Filtration Rate: >90 (02/25/20 05:33:00)    Non-Afn Amer Glomerular Filtration Rate: 89 ml/min/1.64m2 (02/25/20 05:33:00)    Sodium Lvl: 139 mmol/L (02/25/20 05:33:00)    Potassium Lvl: 3.8 mmol/L (02/25/20 05:33:00)    Chloride: 106 mmol/L (02/25/20 05:33:00)    CO2: 24 mmol/L (02/25/20 05:33:00)    Anion Gap: 9 (02/25/20 05:33:00)    Total Protein: 7.1 Gm/dL (93/23/55 73:22:02)    Albumin Lvl: 3.3 Gm/dL (54/27/06 23:76:28)    Calcium Lvl: 8.6 mg/dL (31/51/76 16:07:37)    Magnesium Lvl: 2.2 mg/dL (10/62/69 48:54:62)    Bilirubin Total: 0.4 mg/dL (70/35/00 93:81:82)    Bilirubin Direct: 0.1 mg/dL (99/37/16 96:78:93)    Alkaline Phosphatase: 116 Units/L (02/24/20 18:56:00)    AST: 34 Units/L (02/24/20 18:56:00)    ALT: 39 Units/L (02/24/20 18:56:00)    Lactic Acid Lvl: 0.9 mmol/L (02/24/20 20:23:00)    Troponin I: <0.015 (02/25/20 00:56:00)    WBC: 10.1 thous/mm3 (02/25/20 05:33:00)    RBC: 4.26 Mil/mm3 (02/25/20 05:33:00)    Hgb: 9.4 Gm/dL Low (81/01/75 10:25:85)    Hct: 31.3 % Low (02/25/20 05:33:00)    Platelet: 292 thous/mm3 (02/25/20 05:33:00)    MCV: 73.5 fL Low (02/25/20 05:33:00)    MCH: 22.1 pGm Low (02/25/20 05:33:00)    MCHC: 30 Gm/dL Low (27/78/24 23:53:61)    RDW-SD: 42.3 fL (02/25/20 05:33:00)    MPV: 11.3 fL (02/25/20 05:33:00)    NRBC Percent: 0 % (02/25/20 05:33:00)    Absolute NRBC Count: 0 thous/mm3 (02/25/20 05:33:00)    PT: 11.4 sec (02/24/20 18:56:00)    INR: 1.1 (02/24/20 18:56:00)    aPTT: 30 sec (02/24/20 18:56:00)    Diagnostic Results        ------        SIGNATURE LINE Electronically signed by Germain Osgood MD, Ariq Khamis J on 02/25/2020 at  18:09:23 EST

## 2020-02-20 NOTE — Progress Notes (Signed)
Name :  [name], [name]    DOB :  [id number]    Sex :  Female    MRN :  [id number]    Subjective    doing very well, pain well controlled    no flattus    Objective    Vitals & Measurements    **T: **98.4 F (Oral) **HR: **105(Peripheral) **RR: **18 **BP: **151/67  **SpO2: **94%    **HT: **161 cm **WT: **52 Kg **BMI: **20.06    aao    abd soft NT ND    JP serosang, 190 cc last 24        NGT output minimal    Impression and Plan    Acute PUD    s/p partial gastrectomy/B2 recon    mobilize    ? start PO in am          heparin, Dose: 5,000 Unit(s) = 1 mL, Injection, sc, q8hr, First Dose  Date/Time: 02/21/20 8:00:00 EDT    Pathology Tissue Exam Request    Surg Path Final Report        Medications    _Inpatient_    amitriptyline, 100 mg= 2 tab(s), PO, HS    D5 in 0.45% NS w/KCL 77mEq/L 1,000 mL, 1000 mL, IV    heparin, 5000 Unit(s)= 1 mL, sc, q8hr    Lactated Ringers 1,000 mL, 1000 mL, IV    levothyroxine, 125 mcg= 1 tab(s), PO, Daily    morPHINE, 2 mg= 1 mL, IV Push, q2hr, PRN    ondansetron, 4 mg= 2 mL, IV Push, q6hr, PRN    Protonix, 40 mg= 1 EA, IV Push, q24hr    Tylenol 500 mg oral tablet, 1000 mg= 2 tab(s), PO, q8hr    Lab Results    Glucose Lvl: 100 mg/dL (19/14/78 29:56:21)    BUN: 17 mg/dL (30/86/57 84:69:62)    Creatinine: 0.666 mg/dL (95/28/41 32:44:01)    Afn Amer Glomerular Filtration Rate: >90 (02/21/20 06:01:00)    Non-Afn Amer Glomerular Filtration Rate: 82 ml/min/1.65m2 (02/21/20 06:01:00)    Sodium Lvl: 139 mmol/L (02/21/20 06:01:00)    Potassium Lvl: 4.5 mmol/L (02/21/20 06:01:00)    Chloride: 106 mmol/L (02/21/20 06:01:00)    CO2: 26 mmol/L (02/21/20 06:01:00)    Anion Gap: 7 (02/21/20 06:01:00)    Calcium Lvl: 7.9 mg/dL Low (02/72/53 66:44:03)    Magnesium Lvl: 2.2 mg/dL (47/42/59 56:38:75)    WBC: 16.9 thous/mm3 High (02/21/20 06:01:00)    RBC: 3.69 Mil/mm3 Low (02/21/20 06:01:00)    Hgb: 8.6 Gm/dL Low (64/33/29 51:88:41)    Hct: 28 % Low (02/21/20 06:01:00)    Platelet: 253 thous/mm3 (02/21/20  06:01:00)    MCV: 75.9 fL Low (02/21/20 06:01:00)    MCH: 23.3 pGm Low (02/21/20 06:01:00)    MCHC: 30.7 Gm/dL Low (66/06/30 16:01:09)    RDW-SD: 42.4 fL (02/21/20 06:01:00)    MPV: 11.8 fL (02/21/20 06:01:00)    NRBC Percent: 0 % (02/21/20 06:01:00)    Absolute NRBC Count: 0 thous/mm3 (02/21/20 06:01:00)    Diagnostic Results        ------        SIGNATURE LINE Electronically signed by Donny Pique MD, Wassim on 02/21/2020 at  16:54:13 EST

## 2020-02-20 NOTE — Progress Notes (Signed)
 Name :  [name], [name]    DOB :  [id number]    Sex :  Female    MRN :  [id number]    Subjective    Sleeping - had NG-Tube removed this morning. No N/V. K+= 3.3 - IV fluids  changed to D5 1/2NS with 40 KCl @ 159ml/hr. To start Bariatric Stage II diet.  B/P elevated and started PO Amlodipine on 8/09 - B/P in better range today.    Patient examined, chart reviewed, and discussed plan of care with healthcare  team.    Review of Systems    All systems have been reviewed and are negative unless identified in the HPI.    Objective    Vitals & Measurements    **T: **98.1 F (Oral) **TMIN: **97.6 F (Oral) **TMAX: **98.8 F (Oral) **HR:  **100(Peripheral) **RR: **22 **BP: **139/61 **SpO2: **95%    Physical Exam    General: Alert and oriented, some forgetfulness. No acute distress    Eye: PERRL, no visual disturbances    HEENT: Normocephalic, normal hearing, no sinus tenderness    Neck: supple, no JVD    Respiratory: Lungs clear to auscultation, respirations non-labored    Cardiovascular: Normal Rate, normal peripheral perfusion, no edema.    Gastrointestinal: Abdomen soft, non tender, non-distended, + flatus and BS. BM  x 1    Genitourinary: No CVA tenderness, no dysurea    Musculoskeletal: MAE, normal ROM, generalized weakness.    Integumentary: Skin intact, warm, pink, dry. No pallor, no rash.    Neurologic: Alert, Oriented x 3, forgetful, normal sensory, no focal deficits.  Gag reflex normal. Uses RW for ambulation.    Cognition and Speech: Speech clear and coherent, functional cognition intact    Psychiatric: Cooperative, appropriate mood & affect    Medications    _Inpatient_    amitriptyline, 100 mg= 2 tab(s), PO, HS    amLODIPine, 5 mg= 1 tab(s), PO, Daily    heparin, 5000 Unit(s)= 1 mL, sc, q8hr    levothyroxine, 125 mcg= 1 tab(s), PO, Daily    morPHINE, 2 mg= 1 mL, IV Push, q2hr, PRN    Norco 5 mg-325 mg oral tablet, 1 tab(s), PO, q4hr, PRN    Ofirmev, 1000 mg= 100 mL, IV Piggyback, q6hr, PRN    ondansetron,  4 mg= 2 mL, IV Push, q6hr, PRN    Protonix, 40 mg= 1 EA, IV Push, q24hr    Reglan, 10 mg= 2 mL, IV Push, q6hr, PRN    Tylenol 500 mg oral tablet, 1000 mg= 2 tab(s), PO, q8hr    Lab Results    Glucose Lvl: 94 mg/dL (19/14/78 29:56:21)    BUN: 7 mg/dL Low (30/86/57 84:69:62)    Creatinine: 0.397 mg/dL Low (95/28/41 32:44:01)    Afn Amer Glomerular Filtration Rate: >90 (02/27/20 06:58:00)    Non-Afn Amer Glomerular Filtration Rate: >90 (02/27/20 06:58:00)    Sodium Lvl: 138 mmol/L (02/27/20 06:58:00)    Potassium Lvl: 3.3 mmol/L Low (02/27/20 06:58:00)    Chloride: 106 mmol/L (02/27/20 06:58:00)    CO2: 22 mmol/L (02/27/20 06:58:00)    Anion Gap: 10 (02/27/20 06:58:00)    Calcium Lvl: 8.7 mg/dL (02/72/53 66:44:03)    Lav Top Tube to Hold: DONE (02/27/20 06:58:00)    UA Color: Light Yellow1 (02/27/20 14:22:33)    UA Clarity: Clear1 (02/27/20 14:22:33)    UA Specific Gravity: 1.012 (02/27/20 14:22:33)    UA pH: 6 (02/27/20 14:22:33)    UA Protein: Negative1 (02/27/20  14:22:33)    UA Glucose: Negative1 (02/27/20 14:22:33)    UA Ketones: 20 Abnormal (02/27/20 14:22:33)    UA Bilirubin: Negative1 (02/27/20 14:22:33)    UA Blood: Negative1 (02/27/20 14:22:33)    UA Urobilinogen: Negative1 (02/27/20 14:22:33)    UA Nitrite: Negative1 (02/27/20 14:22:33)    UA Leukocyte Esterase: Negative1 (02/27/20 14:22:33)    UA RBC: <1 (02/27/20 14:22:33)    UA WBC: 1 /HPF (02/27/20 14:22:33)    UA Squamous Epithelial: <1 (02/27/20 14:22:33)    UA Bacteria: None1 (02/27/20 14:22:33)    UA Mucous: Few (02/27/20 14:22:33)    UA Hyaline Casts: 5 /LPF High (02/27/20 14:22:33)    Diagnostic Results    AB/Pelvic CT - 02/24/2020    (02/24/2020 19:40 EDT CT Abdomen and Pelvis C+)    FINDINGS:        ABDOMEN    Lower chest:Please refer to separately dictated chest CT report of same date  for    evaluation of thoracic findings.    Hepatobiliary: There are some subcentimeter hypodensities which are too small  to    characterize but likely represent  small cysts or hamartomas. No concerning  liver    lesion is seen. The portal vein is patent.    Gallbladder: Gallbladder is distended and mildly thick-walled.    Pancreas: Homogeneous in density, with no focal masses or ductal dilation    identified.    Spleen: Homogeneous in density, with no focal lesions identified.    Adrenals: Normal in size and configuration, with no discrete nodules  identified.        Kidneys/Ureters: Mild fullness of the left extrarenal pelvis, similar to  prior.    Subcentimeter left renal hypodensities too small to characterize but    statistically likely to represent small cysts. No focal concerning right renal    lesion. No right-sided hydronephrosis. Minimal thickening of the wall of right    renal pelvis [image 45].    Vessels: Abdominal aorta and IVC are normal in caliber with marked    atherosclerotic calcification of the aorta.    Lymph nodes: No pathologic lymphadenopathy.    Retroperitoneum: Normal.    Gastrointestinal tract: Postsurgical changes of distal gastrectomy and right    hemicolectomy. Mild fat stranding and minimal gas locules are seen adjacent to    the oversewn second portion of duodenum. There is a surgical drain in place in    this region. No circumscribed fluid collection. There is some fat stranding of    the greater omentum which may be inflammatory and/or sequela of recent  surgery.    There is minimal gas likely intraperitoneal deep to a ventral incision in the    midline and also some gas along the right upper abdominal wall. There is  colonic    diverticulosis but no evidence of diverticulitis.        PELVIS:    Pelvic organs: Uterus demonstrates calcifications compatible with fibroids.    There is gas in the bladder likely related to recent catheterization. No  pelvic    fluid collection.    Bones: There are degenerative changes in the spine. There is superior endplate    compression at T11, unchanged. Tiny area of subchondral sclerosis at the  medial     right femoral head may be degenerative in nature or sequela of avascular    necrosis [series 2, image 199], unchanged. There is dextroconvex scoliosis in    the lumbar spine.  Minimal gas noted in the posterior right upper arm possibly sequela of soft    tissue injury or venous cannulation [series 1, image 2].        IMPRESSION:    Postsurgical changes of distal gastrectomy. Small amount of fluid and gas at  the    operative site without circumscribed collection with surgical drain in place.    There is a stranding of the greater omentum which may be inflammatory or  sequela    of recent instrumentation.    There is distention and mild wall thickening of the gallbladder. Cholecystitis    is not excluded. Further evaluation with ultrasound is suggested.    Postsurgical changes of right hemicolectomy.    Colonic diverticulosis without evidence of diverticulitis.    Minimal thickening of the right renal pelvis may be reactive or inflammatory.    Correlation with urinalysis is recommended.    Minimal gas noted in the posterior right upper arm possibly sequela of soft    tissue injury or venous cannulation.    Please see separate CT chest report same day. [1]        Chest CTA - 02/24/2020    (02/24/2020 19:40 EDT CT Angiography Chest)    FINDINGS:        CARDIOVASCULAR: There is a good contrast bolus. There is no intra-arterial    filling defect to suggest a pulmonary embolus. There is no pericardial  effusion.    There is apparent left ventricular hypertrophy. There are mild coronary artery    atheromatous calcifications.        AXILLA: No lymphadenopathy.        MEDIASTINUM: No lymphadenopathy. No mass.        HILA: No lymphadenopathy. No mass.        INCLUDED UPPER ABDOMEN: Reported separately        LUNGS: There is mild dependent atelectasis in both lung bases, left greater  than    right. There is no discrete pneumonia or mass. There is mild biapical  scarring.        MAIN AIRWAYS: Patent.        PLEURA: No  pneumothorax. No pleural effusions.        OSSEOUS STRUCTURES/SOFT TISSUES: No significant abnormality.        LINES/TUBES: Nasogastric tube in place.        IMPRESSION:    No pulmonary embolus.        Mild dependent atelectasis in both lung bases, left greater than right.    [2]        Operative Report - 02/20/2020    PREOPERATIVE DIAGNOSIS:  Peptic ulcer disease/gastric outlet obstruction.        POSTOPERATIVE DIAGNOSIS:  Peptic ulcer disease/gastric outlet obstruction.        PROCEDURES:    1\.  Antrectomy/partial gastrectomy with Billroth II gastrojejunostomy.    2\.  Extensive lysis of adhesions, modifier 22.        SURGEON:  Beverly Milch, M.D. [3]        Impression and Plan    Acute PUD    - S/P Antrectomy by Dr. Donny Pique on 8/03 - POD#7    - NG-Tube removed.    - General surgery managing        # Hypertension - Better control    - Was not taking any meds at home    - Continue PO Amlodipine on 8/09 - Hold for B/P < 120    - Monitor B/P  Q Shift        # Altered MS - Had fall and on AB CT showed mild thickening of R renal pelvis  - correlate with Urinalysis    - Low grade temp in afternoon today    - No Leukocytosis.    - U/A negative for pyuria        PPX: DVT: SC Heparin, Sequential boots when resting in bed Early ambulation    GI: Pantoprazole    Dispo: - POD #6 antrectomy - General surgery managing . B/P in better range -  continue amlodipine    Thank you for your consult - will continue to follow with you.        FACE TO FACE PATIENT COUNSELLING/COORDINATING CARE MORE THAN 50% OF ENCOUNTER  TIME: YES        TOTAL ENCOUNTER TIME: > 30 MINUTES      SIGNATURE LINE Electronically signed by Brynda Peon MD, Seema on 02/28/2020 at  05:10:09 EST

## 2020-02-20 NOTE — Progress Notes (Signed)
 Name :  [name], [name]    DOB :  [id number]    Sex :  Female    MRN :  [id number]    Pre-op Diagnosis    Post-op Diagnosis    Procedures    Date Procedure Modifier Comments    02/20/20 Gastrojejunostomy  N/A Antrectomy      Case Attendees    Attendee Role    Mane MD, [facility] Anesthesiologist of Record    Donny Pique MD, [facility] Primary Surgeon    Raenette Rover First Assistant      Anesthesia Type    Starleen Arms MD, Remus Loffler (Supervisor)    Fredric Mare CRNA, Sharyl Nimrod (Provider)    Johnna Acosta CRNA, Amy (Provider)    Estimated Blood Loss    Transfusions    Transfusions    No qualifying data available.    Catheters, Tubes, Drains    Catheters, Drains, and Tubes     Device Type  Location  Present on Arrival    ---------    Device Type: DRAIN BLAKE 19FR 1/4 HBLS [id number] (02/20/20 14:40:22)   Location: Abdomen (02/20/20 14:40:22)  SN - TDC - Present on Arrival: No  (02/20/20 14:40:22)        Operative Implants    *** No implants in past 24 hours ***    Tourniquet    Operative Specimens    Date/Time Obtained Specimen Description Frozen Section Tests Requested    02/20/20 14:43:00 EDT Antrum No Pathology Tissue Exam Request      Operative Fluids    - Parenteral in ml    - Drains in ml    - Urine Output in ml    Findings    Complications    Discharge Status    OR Disposition        SIGNATURE LINE Electronically signed by Donny Pique MD, Wassim on 02/20/2020 at  15:44:02 EST

## 2020-02-20 NOTE — Progress Notes (Signed)
 Name :  [name], [name]    DOB :  [id number]    Sex :  Female    MRN :  [id number]    Subjective    "I am having pain in my left side" NG-Tube drained drk green-black. No  BM - hypoactive BS. Will continue NPO with IV fluids. B/P elevated - not on  any anti-hypertensives. Will start PO Amlodipine.    Neuro exam stable - States she didn't fall on Sunday. This provider saw her  crawling on her knees in BR, not able to stand. Patient vaguely remembers.  Oriented today x 3 and no H/A or slurring of speech. General surgery is  primary team.    Patient examined, chart reviewed, and discussed plan of care with healthcare  team.    Review of Systems    All systems have been reviewed and are negative unless identified in the HPI.    Objective    Vitals & Measurements    **T: **98.4 F (Oral) **TMIN: **97.5 F (Oral) **TMAX: **99.6 F (Oral) **HR:  **94(Peripheral) **RR: **18 **BP: **164/70 **SpO2: **96% **WT: **55.1 Kg    Physical Exam    General: Alert and oriented, some forgetfulness. No acute distress    Eye: PERRL, no visual disturbances    HEENT: Normocephalic, nasal congestion, normal hearing, no sinus tenderness    Neck: supple, no JVD    Respiratory: Lungs clear to auscultation, respirations non-labored    Cardiovascular: Normal Rate, normal peripheral perfusion, no edema.    Gastrointestinal: Abdomen soft, non tender, non-distended, NG-Tube present, +  flatus Hypoactive BS. No BM    Genitourinary: No CVA tenderness, no dysurea    Musculoskeletal: MAE, normal ROM, generalized weakness.    Integumentary: Skin intact, warm, pink, dry. No pallor, no rash.    Neurologic: Alert, Oriented x 3, forgetful, normal sensory, no focal deficits.  Gag reflex normal. Uses RW for ambulation.    Cognition and Speech: Speech clear and coherent, functional cognition intact    Psychiatric: Cooperative, appropriate mood & affect    Medications    _Inpatient_    amitriptyline, 100 mg= 2 tab(s), PO, HS    amLODIPine, 5 mg= 1  tab(s), PO, Daily    Dextrose 5% in 0.45% NS w/KCl 20 mEq/L 1,000 mL, 1000 mL, IV    heparin, 5000 Unit(s)= 1 mL, sc, q8hr    levothyroxine, 125 mcg= 1 tab(s), PO, Daily    morPHINE, 2 mg= 1 mL, IV Push, q2hr, PRN    Ofirmev, 1000 mg= 100 mL, IV Piggyback, q6hr, PRN    ondansetron, 4 mg= 2 mL, IV Push, q6hr, PRN    Protonix, 40 mg= 1 EA, IV Push, q24hr    Reglan, 10 mg= 2 mL, IV Push, q6hr, PRN    Tylenol 500 mg oral tablet, 1000 mg= 2 tab(s), PO, q8hr    Lab Results    Glucose Lvl: 69 mg/dL Low (30/86/57 84:69:62)    BUN: 11 mg/dL (95/28/41 32:44:01)    Creatinine: 0.363 mg/dL Low (02/72/53 66:44:03)    Afn Amer Glomerular Filtration Rate: >90 (02/26/20 05:57:00)    Non-Afn Amer Glomerular Filtration Rate: >90 (02/26/20 05:57:00)    Sodium Lvl: 140 mmol/L (02/26/20 05:57:00)    Potassium Lvl: 3.6 mmol/L (02/26/20 05:57:00)    Chloride: 108 mmol/L (02/26/20 05:57:00)    CO2: 23 mmol/L (02/26/20 05:57:00)    Anion Gap: 9 (02/26/20 05:57:00)    Calcium Lvl: 8.1 mg/dL Low (47/42/59 56:38:75)  WBC: 8.9 thous/mm3 (02/26/20 05:57:00)    RBC: 4.07 Mil/mm3 (02/26/20 05:57:00)    Hgb: 9.3 Gm/dL Low (16/10/96 04:54:09)    Hct: 30.3 % Low (02/26/20 05:57:00)    Platelet: 294 thous/mm3 (02/26/20 05:57:00)    MCV: 74.4 fL Low (02/26/20 05:57:00)    MCH: 22.9 pGm Low (02/26/20 05:57:00)    MCHC: 30.7 Gm/dL Low (81/19/14 78:29:56)    RDW-SD: 42.9 fL (02/26/20 05:57:00)    MPV: 11.1 fL (02/26/20 05:57:00)    NRBC Percent: 0 % (02/26/20 05:57:00)    Absolute NRBC Count: 0 thous/mm3 (02/26/20 05:57:00)    Diagnostic Results    AB/Pelvic CT - 02/24/2020    (02/24/2020 19:40 EDT CT Abdomen and Pelvis C+)    FINDINGS:        ABDOMEN    Lower chest:Please refer to separately dictated chest CT report of same date  for    evaluation of thoracic findings.    Hepatobiliary: There are some subcentimeter hypodensities which are too small  to    characterize but likely represent small cysts or hamartomas. No concerning  liver    lesion is  seen. The portal vein is patent.    Gallbladder: Gallbladder is distended and mildly thick-walled.    Pancreas: Homogeneous in density, with no focal masses or ductal dilation    identified.    Spleen: Homogeneous in density, with no focal lesions identified.    Adrenals: Normal in size and configuration, with no discrete nodules  identified.        Kidneys/Ureters: Mild fullness of the left extrarenal pelvis, similar to  prior.    Subcentimeter left renal hypodensities too small to characterize but    statistically likely to represent small cysts. No focal concerning right renal    lesion. No right-sided hydronephrosis. Minimal thickening of the wall of right    renal pelvis [image 45].    Vessels: Abdominal aorta and IVC are normal in caliber with marked    atherosclerotic calcification of the aorta.    Lymph nodes: No pathologic lymphadenopathy.    Retroperitoneum: Normal.    Gastrointestinal tract: Postsurgical changes of distal gastrectomy and right    hemicolectomy. Mild fat stranding and minimal gas locules are seen adjacent to    the oversewn second portion of duodenum. There is a surgical drain in place in    this region. No circumscribed fluid collection. There is some fat stranding of    the greater omentum which may be inflammatory and/or sequela of recent  surgery.    There is minimal gas likely intraperitoneal deep to a ventral incision in the    midline and also some gas along the right upper abdominal wall. There is  colonic    diverticulosis but no evidence of diverticulitis.        PELVIS:    Pelvic organs: Uterus demonstrates calcifications compatible with fibroids.    There is gas in the bladder likely related to recent catheterization. No  pelvic    fluid collection.    Bones: There are degenerative changes in the spine. There is superior endplate    compression at T11, unchanged. Tiny area of subchondral sclerosis at the  medial    right femoral head may be degenerative in nature or sequela of  avascular    necrosis [series 2, image 199], unchanged. There is dextroconvex scoliosis in    the lumbar spine.        Minimal gas noted in the posterior right upper arm  possibly sequela of soft    tissue injury or venous cannulation [series 1, image 2].        IMPRESSION:    Postsurgical changes of distal gastrectomy. Small amount of fluid and gas at  the    operative site without circumscribed collection with surgical drain in place.    There is a stranding of the greater omentum which may be inflammatory or  sequela    of recent instrumentation.    There is distention and mild wall thickening of the gallbladder. Cholecystitis    is not excluded. Further evaluation with ultrasound is suggested.    Postsurgical changes of right hemicolectomy.    Colonic diverticulosis without evidence of diverticulitis.    Minimal thickening of the right renal pelvis may be reactive or inflammatory.    Correlation with urinalysis is recommended.    Minimal gas noted in the posterior right upper arm possibly sequela of soft    tissue injury or venous cannulation.    Please see separate CT chest report same day. [1]        Chest CTA - 02/24/2020    (02/24/2020 19:40 EDT CT Angiography Chest)    FINDINGS:        CARDIOVASCULAR: There is a good contrast bolus. There is no intra-arterial    filling defect to suggest a pulmonary embolus. There is no pericardial  effusion.    There is apparent left ventricular hypertrophy. There are mild coronary artery    atheromatous calcifications.        AXILLA: No lymphadenopathy.        MEDIASTINUM: No lymphadenopathy. No mass.        HILA: No lymphadenopathy. No mass.        INCLUDED UPPER ABDOMEN: Reported separately        LUNGS: There is mild dependent atelectasis in both lung bases, left greater  than    right. There is no discrete pneumonia or mass. There is mild biapical  scarring.        MAIN AIRWAYS: Patent.        PLEURA: No pneumothorax. No pleural effusions.        OSSEOUS STRUCTURES/SOFT  TISSUES: No significant abnormality.        LINES/TUBES: Nasogastric tube in place.        IMPRESSION:    No pulmonary embolus.        Mild dependent atelectasis in both lung bases, left greater than right.    [2]        Operative Report - 02/20/2020    PREOPERATIVE DIAGNOSIS:  Peptic ulcer disease/gastric outlet obstruction.        POSTOPERATIVE DIAGNOSIS:  Peptic ulcer disease/gastric outlet obstruction.        PROCEDURES:    1\.  Antrectomy/partial gastrectomy with Billroth II gastrojejunostomy.    2\.  Extensive lysis of adhesions, modifier 22.        SURGEON:  Beverly Milch, M.D. [3]    Impression and Plan    Acute PUD    - S/P Antrectomy by Dr. Donny Pique on 8/03 - POD#6    - Still with NG-Tube and high output - Had repositioning during day.    - General surgery managing        # Hypertension - Continues to be elevated.    - No meds at home    - Start PO Amlodipine on 8/09 - Hold for B/P < 120    - Monitor B/P Q 4hrs except during sleep        #  Altered MS - Had fall and on AB CT showed mild thickening of R renal pelvis  - correlate with Urinalysis    - Low grade temp in afternoon today    - No Leukocytosis.    - Send U/A    PPX: DVT: SC Heparin, Sequential boots when resting in bed Early ambulation    GI: Pantoprazole    Dispo: Ongoing high GI output - General surgery managing. B/P elevated - will  start amlodipine today    Thank you for your consult - will continue to follow with you.        FACE TO FACE PATIENT COUNSELLING/COORDINATING CARE MORE THAN 50% OF ENCOUNTER  TIME: YES        TOTAL ENCOUNTER TIME: > 35 MINUTES      [1] CT Abdomen and Pelvis C+; McMahon MD, Colm 02/24/2020 19:40 EDT    [2] CT Angiography Chest; Zettie Pho MD, Scott 02/24/2020 19:40 EDT    [3] OPERATIVE REPORT; Donny Pique MD, Lynann Beaver 02/20/2020 00:00 EDT    SIGNATURE LINE Electronically signed by Irving Burton MD-HOSP, Mohammed on 03/04/2020  at 09:81:19 EST

## 2020-02-20 NOTE — H&P (Signed)
 ____________________________________________________________    PREOPERATIVE HISTORY AND PHYSICAL  ADMITTED:  02/20/2020    PRIMARY CARE PHYSICIAN:  Electa Sniff, M.D.    HISTORY OF PRESENT ILLNESS:  The patient is an 85 year old woman who presented   in consultation for gastric outlet obstruction.  She was previously living in   [place] and in 10/2018 had a gastric and duodenal ulcer that was found on EGD.    Biopsies at that time were done for H. pylori.  The finding of the EGD with a   healed antral ulcer, large necrotic duodenal ulcer, second portion and multiple   small ulcers in the bulb.  Then, in July of 2020, she did present to the ED in   [place] at [facility] and with abdominal pain and was found   to have a perforated duodenal ulcer.  At that time, an exploratory laparotomy,   modified Graham patch repair of the posterior duodenal ulcer, which was 4 mm,   abdominal washout and appendectomy was performed for fecalith.     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  Since  then, she has been taking her PPIs twice daily.  She was having abdominal pain   for a couple of days in the epigastrium and right upper quadrant, associated   with emesis and she also had dark stools.  Hemoglobin dropped from 15.6 to 8.6   and EGD was done by Dr. Elesa Hacker, which showed a large duodenal ulcer, but the   scope was unable to pass through.    This was during a recent admission from   11/23/2019 to 11/28/2019 of this year.  Since then, she did return for another   endoscopy as an outpatient with Dr. Elesa Hacker and she was found to have the same   results.  There was a pinpoint entrance and the scope was unable to be passed to  the duodenum.  The patient reports that she does not have a recent weight loss.   She does report some  nausea.  No emesis.  She denies chest pain, shortness of   breath or diarrhea.  She does report constipation.    PAST MEDICAL HISTORY:  Hypothyroidism, reflux and depression.    PAST SURGICAL HISTORY:  As per HPI.    REVIEW OF SYSTEMS:  As per HPI, otherwise negative.    SOCIAL HISTORY:  Denies tobacco use, alcohol use socially,  very rarely, once or  twice a month.    ALLERGIES:  No known drug allergies.    MEDICATIONS:  Amitriptyline, levothyroxine and Protonix 40 mg daily.    PHYSICAL EXAMINATION:  Not dictated.    RADIOLOGY DATA:  CT scan from 11/23/2019 with circumferential thickening of the   wall of the distal caudal third of the esophagus.  Differential diagnostic   considerations include esophagitis, malignancy, and markedly distended stomach.   Differential diagnosis considerations include gastric outlet obstruction.    Further clarification and characterization of GI.    Surgical consultation upper  GI imaging could be considered if clinically indicated.    ASSESSMENT:  Peptic ulcer disease and gastric outlet obstruction.    PLAN:  Given that she has been taking Protonix twice daily and still her EGD   shows a pinpoint opening, we have discussed surgical intervention with   antrectomy and gastrojejunostomy as well as  a dichotomy.  She will have to be   n.p.o. prior to procedure, IV antibiotics at time of surgery.  I would like for   her to get an upper GI with small bowel follow through and she should start Husk  supplementation twice daily for her constipation.    Dictated by:  Claire Shown Eusebio Friendly, M.D.    DD: 02/15/2020 17:54:11  DT: 02/16/2020 09:02:00  DET/tam  Job #: [id number]   SIGNATURE LINE    Electronically signed by Eusebio Friendly MD, Claire Shown on 02/18/2020 at 12:38:53 EST

## 2020-02-20 NOTE — Progress Notes (Signed)
Name :  [name], [name]    DOB :  [id number]    Sex :  Female    MRN :  [id number]    Subjective    POD#5    Events noted. Sounds like it was a false alarm. Patient states she didn't make  it to the bathroom, and urinated in her pants and on the floor. She was on her  hands and knees cleaning the floor, and discovered she had difficulty getting  up, so rang the bell. According to the patient, that is when the rapid  response was called. Work-up included head CT, CT angio of chest, and  abd/pelvic CT, all of which were negative for any acute problem. Today, she  looks much better, and feels fine. She wants to eat.    Objective    Vitals & Measurements    **T: **98.1 F (Oral) **HR: **102 **RR: **17 **BP: **152/80 **SpO2: **99%    **HT: **160 cm **WT: **55.1 Kg **BMI: **23.44    General - No acute distress.    HEENT - Ears, nose and throat grossly normal.    Respiratory - Non-labored respirations, symmetric expansion    Cardiac - Regular rate and rhythm    Lymph - No adenopathy    GI - Abdomen flat, soft, non-distended, non-tender, no organomegaly, Wound ok.  Drain with serosanguinous output.    Musculoskeletal - No extremity deformities    Neuro - A&O x 3, moves all 4 extremities    Psych - Appropriate judgment    Impression and Plan    Acute PUD    Sodium Chloride 0.9% 1,000 mL, Dose: 1,000 mL, Soln, IV, Routine, 150 ml/hr,  6.7 hr, 1,000 ml (TOTAL VOLUME), First dose date/time: 02/24/20 18:49:00 EDT            Doing much better today, but NGT had significant (1200cc) bilious output, so  will leave in until tomorrow, as her episode was preceded by significant  nausea and some vomiting. OK to transfer back to [facility] - arrangements made. Dr.  Donny Pique back tomorrow.        Medications    _Inpatient_    amitriptyline, 100 mg= 2 tab(s), PO, HS    heparin, 5000 Unit(s)= 1 mL, sc, q8hr    levothyroxine, 125 mcg= 1 tab(s), PO, Daily    morPHINE, 2 mg= 1 mL, IV Push, q2hr, PRN    Norco 5 mg-325 mg oral tablet, 1 tab(s),  PO, q4hr, PRN    NS 1,000 mL, 1000 mL, IV    Ofirmev, 1000 mg= 100 mL, IV Piggyback, q6hr, PRN    ondansetron, 4 mg= 2 mL, IV Push, q6hr, PRN    Protonix, 40 mg= 1 EA, IV Push, q24hr    Reglan, 10 mg= 2 mL, IV Push, q6hr, PRN    Tylenol 500 mg oral tablet, 1000 mg= 2 tab(s), PO, q8hr    Lab Results    Glucose Lvl: 85 mg/dL (59/56/38 75:64:33)    BUN: 10 mg/dL (29/51/88 41:66:06)    Creatinine: 0.506 mg/dL Low (30/16/01 09:32:35)    Afn Amer Glomerular Filtration Rate: >90 (02/25/20 05:33:00)    Non-Afn Amer Glomerular Filtration Rate: 89 ml/min/1.46m2 (02/25/20 05:33:00)    Sodium Lvl: 139 mmol/L (02/25/20 05:33:00)    Potassium Lvl: 3.8 mmol/L (02/25/20 05:33:00)    Chloride: 106 mmol/L (02/25/20 05:33:00)    CO2: 24 mmol/L (02/25/20 05:33:00)    Anion Gap: 9 (02/25/20 05:33:00)    Total Protein: 7.1 Gm/dL (57/32/20  18:56:00)    Albumin Lvl: 3.3 Gm/dL (29/56/21 30:86:57)    Calcium Lvl: 8.6 mg/dL (84/69/62 95:28:41)    Magnesium Lvl: 2.2 mg/dL (32/44/01 02:72:53)    Bilirubin Total: 0.4 mg/dL (66/44/03 47:42:59)    Bilirubin Direct: 0.1 mg/dL (56/38/75 64:33:29)    Alkaline Phosphatase: 116 Units/L (02/24/20 18:56:00)    AST: 34 Units/L (02/24/20 18:56:00)    ALT: 39 Units/L (02/24/20 18:56:00)    Lactic Acid Lvl: 0.9 mmol/L (02/24/20 20:23:00)    Troponin I: <0.015 (02/25/20 00:56:00)    WBC: 10.1 thous/mm3 (02/25/20 05:33:00)    RBC: 4.26 Mil/mm3 (02/25/20 05:33:00)    Hgb: 9.4 Gm/dL Low (51/88/41 66:06:30)    Hct: 31.3 % Low (02/25/20 05:33:00)    Platelet: 292 thous/mm3 (02/25/20 05:33:00)    MCV: 73.5 fL Low (02/25/20 05:33:00)    MCH: 22.1 pGm Low (02/25/20 05:33:00)    MCHC: 30 Gm/dL Low (16/01/09 32:35:57)    RDW-SD: 42.3 fL (02/25/20 05:33:00)    MPV: 11.3 fL (02/25/20 05:33:00)    NRBC Percent: 0 % (02/25/20 05:33:00)    Absolute NRBC Count: 0 thous/mm3 (02/25/20 05:33:00)    PT: 11.4 sec (02/24/20 18:56:00)    INR: 1.1 (02/24/20 18:56:00)    aPTT: 30 sec (02/24/20 18:56:00)    Diagnostic Results         ------        SIGNATURE LINE Electronically signed by Colin Benton MD, Chadley Dziedzic on 02/25/2020 at  17:01:05 EST

## 2020-02-20 NOTE — Progress Notes (Signed)
 Name :  [name], [name]    DOB :  [id number]    Sex :  Female    MRN :  [id number]    Subjective    Patient plaint of shortness of breath.  Denied headache, chest pain or  abdominal pain.    Review of Systems    10 point review of systems performed and all negative except as noted above.    Objective    Vitals & Measurements    **T: **98.1 ??F (Oral) **TMIN: **98 ??F (Oral) **TMAX: **98.5 ??F (Oral) **HR:  **84(Peripheral) **RR: **12 **BP: **153/84 **SpO2: **96%    Physical Exam    General appearance: Elderly lady not lying in bed.  No cyanosis or  diaphoresis.    Head: Normocephalic, atraumatic, no facial asymmetry    Eyes: Pupils equal, round and reactive to light. No icterus.    Mouth/throat: Mucous membranes moist, tongue midline    Neck: Supple, trachea midline, no JVD    Chest: Symmetrical expansion, lungs clear to auscultation with no wheezes or  crackles    Cardiovascular: Sinus tachycardia, no murmurs rubs or gallops, no peripheral  edema    Abdomen: Bowel sounds absent, soft, nontender and distended. Positive JP  draining serosanguineous fluid.    GU: Deferred    Skin: Warm, no petechia or purpura    Extremities: No clubbing cyanosis or edema    Neurological: Alert but slightly confused. Patient able to move all four  extremities. Strength symmetrical.      Medications    _Inpatient_    amitriptyline, 100 mg= 2 tab(s), PO, HS    heparin, 5000 Unit(s)= 1 mL, sc, q8hr    levothyroxine, 125 mcg= 1 tab(s), PO, Daily    morPHINE, 2 mg= 1 mL, IV Push, q2hr, PRN    Norco 5 mg-325 mg oral tablet, 1 tab(s), PO, q4hr, PRN    NS 1,000 mL, 1000 mL, IV    Ofirmev, 1000 mg= 100 mL, IV Piggyback, q6hr, PRN    ondansetron, 4 mg= 2 mL, IV Push, q6hr, PRN    Protonix, 40 mg= 1 EA, IV Push, q24hr    Reglan, 10 mg= 2 mL, IV Push, q6hr, PRN    Tylenol 500 mg oral tablet, 1000 mg= 2 tab(s), PO, q8hr    Lab Results    Glucose Lvl: 121 mg/dL High (54/27/06 23:76:28)    BUN: 9 mg/dL (31/51/76 16:07:37)    Creatinine: 0.56 mg/dL  (10/62/69 48:54:62)    Afn Amer Glomerular Filtration Rate: >90 (02/24/20 06:19:00)    Non-Afn Amer Glomerular Filtration Rate: 86 ml/min/1.59m2 (02/24/20 06:19:00)    Sodium Lvl: 135 mmol/L Low (02/24/20 06:19:00)    Potassium Lvl: 3.8 mmol/L (02/24/20 06:19:00)    Chloride: 101 mmol/L (02/24/20 06:19:00)    CO2: 27 mmol/L (02/24/20 06:19:00)    Anion Gap: 7 (02/24/20 06:19:00)    Calcium Lvl: 8.8 mg/dL (70/35/00 93:81:82)    Magnesium Lvl: 2.2 mg/dL (99/37/16 96:78:93)    WBC: 10.5 thous/mm3 (02/24/20 18:56:00)    RBC: 4.61 Mil/mm3 (02/24/20 18:56:00)    Hgb: 10.1 Gm/dL Low (81/01/75 10:25:85)    Hct: 33.7 % Low (02/24/20 18:56:00)    Platelet: 380 thous/mm3 (02/24/20 18:56:00)    MCV: 73.1 fL Low (02/24/20 18:56:00)    MCH: 21.9 pGm Low (02/24/20 18:56:00)    MCHC: 30 Gm/dL Low (27/78/24 23:53:61)    RDW-SD: 41.2 fL (02/24/20 18:56:00)    MPV: 10.5 fL (02/24/20 18:56:00)    Absolute Neutro Count: 7.71  thous/mm3 (02/24/20 06:19:00)    Absolute Lymphs Count: 1.26 thous/mm3 (02/24/20 06:19:00)    Absolute Mono Count: 0.92 thous/mm3 (02/24/20 06:19:00)    Absolute Eos Count: 0.15 thous/mm3 (02/24/20 06:19:00)    Absolute Baso Count: 0.02 thous/mm3 (02/24/20 06:19:00)    Neutrophils: 76.3 % (02/24/20 06:19:00)    Lymphocytes: 12.5 % (02/24/20 06:19:00)    Monocytes: 9.1 % (02/24/20 06:19:00)    Eosinophils: 1.5 % (02/24/20 06:19:00)    Basophils: 0.2 % (02/24/20 06:19:00)    Immature Granulocytes: 0.4 % (02/24/20 06:19:00)    NRBC Percent: 0 % (02/24/20 18:56:00)    Absolute NRBC Count: 0 thous/mm3 (02/24/20 18:56:00)    Diagnostic Results    Impression and Plan    The patient is an 85 year old lady who underwent an antrectomy and partial  gastrectomy with Billroth II gastrojejunostomy on 02/20/2020. Today a rapid  response was called. When I encounter the patient she was lying in bed, and  appeared pale and confused.  Initially she was hypoxic but was placed by the  nursing staff on 100% nonrebreather.  The patient was  also tachycardic. She  told me she lost her balance and fell and thinks she did not hit her head but  is not 100% sure. Her nurse found her on the floor on her knees.    The patient was weaned from 100% FiO2 on a nonrebreather to 2 L nasal cannula  and is saturating at 100%. EKG interpreted by me showed sinus tachycardia with  no ST segment changes or T wave inversions. CT of the head, chest x-ray, CBC,  CMP, lactic acid, and troponin were ordered. I was made aware that Dr. Thomasene Ripple who was covering surgeon also ordered a CT of the chest and abdomen  pelvis. The patient is hemodynamically stable at this time. The case was  signed out to the nocturnist Dr. Abundio Miu.  Left a voicemail for Dr. Wallie Char  at [phone] dating him on the particulars of the case and the tests  ordered.  I asked him to contact the nocturnist for any further information or  concerns.        Code Status - Ordered    -- 02/23/20 7:06:00 EDT, Full Resuscitation, Constant Order        45 minutes critical time spent in patient evaluation, management, coordination  of care and face to face counseling. More than 50% of this time spent on  patient counseling.      SIGNATURE LINE Electronically signed by Lily Peer MD, Laura-Lee Villegas on 02/24/2020 at  19:34:27 EST

## 2020-02-20 NOTE — Progress Notes (Signed)
 Name :  [name], [name]    DOB :  [id number]    Sex :  Female    MRN :  [id number]    Subjective    POD#4 s/p antrectomy without vagotomy for obstruction secondary to PUD.    Seen earlier this am.    Had N/V. States her abdomen does not feel distended.    JP with serosanguinous drainage.    Not tolerating clears.    Abdomen appears slightly distended, but soft with Appropriate incisional  tenderness. Silver dressing dry and intact.    Will try reglan. If no help, place NGT.        UPDATE: Received a call after patient fell in bathroom and found with low  oxygen saturation and slurred speech. Rapid Response team called. She rapidly  improved clinically. Plan to check head CT, as well as CT angio of chest and  abd/pelvic CT. Place NGT. Transfer to monitored setting. Appreciate input from  Hospital Medicine.    Objective    Vitals & Measurements    **T: **98.1 ??F (Oral) **HR: **84(Peripheral) **RR: **12 **BP: **153/84 **SpO2:  **96%    **HT: **161 cm **WT: **52 Kg **BMI: **20.06    Impression and Plan    Acute PUD    acetaminophen, Dose: 1,000 mg = 100 mL, Injection, IV Piggyback, q6hr, PRN,  Pain, Mild, 400 ml/hr, over 15 minute(s), 100 ml (TOTAL VOLUME), First Dose  Date/Time: 02/24/20 15:48:00 EDT, The formulary approved dose for patients 50  kg or above is 1000mg  one time dose.    acetaminophen-hydrocodone, Dose: 1 tab(s), Tab, PO, q4hr, PRN, Pain, Moderate,  First Dose Date/Time: 02/24/20 15:47:00 EDT    metoclopramide, Dose: 10 mg = 2 mL, Injection, IV Push, q6hr, PRN,  Nausea/Vomiting, First Dose Date/Time: 02/24/20 15:46:00 EDT        Medications    _Inpatient_    amitriptyline, 100 mg= 2 tab(s), PO, HS    heparin, 5000 Unit(s)= 1 mL, sc, q8hr    levothyroxine, 125 mcg= 1 tab(s), PO, Daily    morPHINE, 2 mg= 1 mL, IV Push, q2hr, PRN    Norco 5 mg-325 mg oral tablet, 1 tab(s), PO, q4hr, PRN    NS 1,000 mL, 1000 mL, IV    Ofirmev, 1000 mg= 100 mL, IV Piggyback, q6hr, PRN    ondansetron, 4 mg= 2 mL, IV Push,  q6hr, PRN    Protonix, 40 mg= 1 EA, IV Push, q24hr    Reglan, 10 mg= 2 mL, IV Push, q6hr, PRN    Tylenol 500 mg oral tablet, 1000 mg= 2 tab(s), PO, q8hr    Lab Results    Glucose Lvl: 126 mg/dL High (05/05/50 02:58:52)    BUN: 9 mg/dL (77/82/42 35:36:14)    Creatinine: 0.667 mg/dL (43/15/40 08:67:61)    Afn Amer Glomerular Filtration Rate: >90 (02/24/20 06:19:00)    Non-Afn Amer Glomerular Filtration Rate: 86 ml/min/1.56m2 (02/24/20 06:19:00)    Sodium Lvl: 132 mmol/L Low (02/24/20 18:56:00)    Potassium Lvl: 4.1 mmol/L (02/24/20 18:56:00)    Chloride: 100 mmol/L (02/24/20 18:56:00)    CO2: 22 mmol/L (02/24/20 18:56:00)    Anion Gap: 10 (02/24/20 18:56:00)    Total Protein: 7.1 Gm/dL (95/09/32 67:12:45)    Albumin Lvl: 3.3 Gm/dL (80/99/83 38:25:05)    Calcium Lvl: 9.1 mg/dL (39/76/73 41:93:79)    Magnesium Lvl: 2.2 mg/dL (02/40/97 35:32:99)    Bilirubin Total: 0.4 mg/dL (24/26/83 41:96:22)    Bilirubin Direct: 0.1 mg/dL (29/79/89 21:19:41)  Alkaline Phosphatase: 116 Units/L (02/24/20 18:56:00)    AST: 34 Units/L (02/24/20 18:56:00)    ALT: 39 Units/L (02/24/20 18:56:00)    Troponin I: <0.015 (02/24/20 18:56:00)    WBC: 10.5 thous/mm3 (02/24/20 18:56:00)    RBC: 4.61 Mil/mm3 (02/24/20 18:56:00)    Hgb: 10.1 Gm/dL Low (37/94/44 61:90:12)    Hct: 33.7 % Low (02/24/20 18:56:00)    Platelet: 380 thous/mm3 (02/24/20 18:56:00)    MCV: 73.1 fL Low (02/24/20 18:56:00)    MCH: 21.9 pGm Low (02/24/20 18:56:00)    MCHC: 30 Gm/dL Low (22/41/14 64:31:42)    RDW-SD: 41.2 fL (02/24/20 18:56:00)    MPV: 10.5 fL (02/24/20 18:56:00)    Absolute Neutro Count: 7.71 thous/mm3 (02/24/20 06:19:00)    Absolute Lymphs Count: 1.26 thous/mm3 (02/24/20 06:19:00)    Absolute Mono Count: 0.92 thous/mm3 (02/24/20 06:19:00)    Absolute Eos Count: 0.15 thous/mm3 (02/24/20 06:19:00)    Absolute Baso Count: 0.02 thous/mm3 (02/24/20 06:19:00)    Neutrophils: 76.3 % (02/24/20 06:19:00)    Lymphocytes: 12.5 % (02/24/20 06:19:00)    Monocytes: 9.1 %  (02/24/20 06:19:00)    Eosinophils: 1.5 % (02/24/20 06:19:00)    Basophils: 0.2 % (02/24/20 06:19:00)    Immature Granulocytes: 0.4 % (02/24/20 06:19:00)    NRBC Percent: 0 % (02/24/20 18:56:00)    Absolute NRBC Count: 0 thous/mm3 (02/24/20 18:56:00)    PT: 11.4 sec (02/24/20 18:56:00)    INR: 1.1 (02/24/20 18:56:00)    aPTT: 30 sec (02/24/20 18:56:00)    Diagnostic Results        ------        SIGNATURE LINE Electronically signed by Colin Benton MD, Arizona Nordquist on 02/24/2020 at  19:59:09 EST

## 2020-02-20 NOTE — Progress Notes (Signed)
 Name :  RAINA, SOLE    DOB :  [id number]    Sex :  Female    MRN :  [id number]    Patient was a rapid response earlier in the afternoon.  I was following up  with lab works and imaging supposed rapid response.        Labwork: No abnormalities noted.    CT brain noncontrast: No evidence of acute intracranial bleed or large  territorial infarct.    CTA chest: No pulmonary embolism. Mild dependent atelectasis in both lung  bases left greater than the right.    CT abdomen pelvis with contrast: Postsurgical changes and distal gastrectomy  with small amount of fluid and gas at the operative site without circumscribed  collection with surgical drain in place, stranding of greater omentum may  represent inflammatory or sequelae of recent instrumentation. Distention and  mild wall thickening of gallbladder, cannot exclude cholecystitis.  Postsurgical changes of right hemicolectomy, colonic diverticulosis without  evidence of diverticulitis, minimal thickening of right renal pelvis may be  reactive or inflammatory, minimal gas noted in posterior right upper arm  possibly sequelae of soft tissue injury or venous cannulation.        We will continue to monitor patient.    SIGNATURE LINE Electronically signed by Abundio Miu MD, Reedy Biernat on 02/25/2020 at  01:57:44 EST

## 2020-02-21 LAB — HX BASIC METABOLIC PANEL
CASE NUMBER: 2021216000448
HX ANION GAP: 7 — NL (ref 3.0–11.0)
HX BUN: 17 mg/dL — NL (ref 8.0–23.0)
HX CALCIUM LVL: 7.9 mg/dL — ABNORMAL LOW (ref 8.5–10.5)
HX CHLORIDE: 106 mmol/L — NL (ref 98.0–110.0)
HX CO2: 26 mmol/L — NL (ref 21.0–32.0)
HX CREATININE: 0.666 mg/dL — NL (ref 0.55–1.3)
HX GLUCOSE LVL: 100 mg/dL — NL (ref 70.0–110.0)
HX POTASSIUM LVL: 4.5 mmol/L — NL (ref 3.6–5.2)
HX SODIUM LVL: 139 mmol/L — NL (ref 136.0–146.0)

## 2020-02-21 LAB — HX CBC W/ INDICES
CASE NUMBER: 2021216000448
HX ABSOLUTE NRBC COUNT: 0 10*3/uL
HX HCT: 28 % — ABNORMAL LOW (ref 36.0–47.0)
HX HGB: 8.6 g/dL — ABNORMAL LOW (ref 11.8–16.0)
HX MCH: 23.3 pg — ABNORMAL LOW (ref 26.0–34.0)
HX MCHC: 30.7 g/dL — ABNORMAL LOW (ref 31.0–37.0)
HX MCV: 75.9 fL — ABNORMAL LOW (ref 80.0–100.0)
HX MPV: 11.8 fL — NL (ref 9.4–12.4)
HX NRBC PERCENT: 0 % — NL
HX PLATELET: 253 10*3/uL — NL (ref 150.0–400.0)
HX RBC: 3.69 10*6/uL — ABNORMAL LOW (ref 3.9–5.2)
HX RDW-CV: 15.3 % — ABNORMAL HIGH (ref 11.5–14.5)
HX RDW-SD: 42.4 fL — NL (ref 35.0–51.0)
HX WBC: 16.9 10*3/uL — ABNORMAL HIGH (ref 3.7–11.2)

## 2020-02-21 LAB — HX GLOMERULAR FILTRATION RATE (ESTIMATED)
CASE NUMBER: 2021216000448
HX AFN AMER GLOMERULAR FILTRATION RATE: >90
HX NON-AFN AMER GLOMERULAR FILTRATION RATE: 82 mL/min/{1.73_m2}

## 2020-02-21 LAB — HX MAGNESIUM LEVEL
CASE NUMBER: 2021216000448
HX MAGNESIUM LVL: 2.2 mg/dL — NL (ref 1.7–2.5)

## 2020-02-22 LAB — HX SURG PATH FINAL REPORT
CASE NUMBER: 0
HX NOTE: 88307

## 2020-02-24 LAB — HX CBC W/ DIFF
CASE NUMBER: 2021219000514
HX ABSOLUTE NRBC COUNT: 0 10*3/uL
HX HCT: 33.1 % — ABNORMAL LOW (ref 36.0–47.0)
HX HGB: 9.9 g/dL — ABNORMAL LOW (ref 11.8–16.0)
HX MCH: 22.3 pg — ABNORMAL LOW (ref 26.0–34.0)
HX MCHC: 29.9 g/dL — ABNORMAL LOW (ref 31.0–37.0)
HX MCV: 74.7 fL — ABNORMAL LOW (ref 80.0–100.0)
HX MPV: 11.8 fL — NL (ref 9.4–12.4)
HX NRBC PERCENT: 0 % — NL
HX PLATELET: 362 10*3/uL — NL (ref 150.0–400.0)
HX RBC: 4.43 10*6/uL — NL (ref 3.9–5.2)
HX RDW-CV: 15.7 % — ABNORMAL HIGH (ref 11.5–14.5)
HX RDW-SD: 42.6 fL — NL (ref 35.0–51.0)
HX WBC: 10.1 10*3/uL — NL (ref 3.7–11.2)

## 2020-02-24 LAB — HX .AUTOMATED DIFF
CASE NUMBER: 2021219000514
HX ABSOLUTE BASO COUNT: 0.02 10*3/uL — NL (ref 0.0–0.22)
HX ABSOLUTE EOS COUNT: 0.15 10*3/uL — NL (ref 0.0–0.45)
HX ABSOLUTE LYMPHS COUNT: 1.26 10*3/uL — NL (ref 0.74–5.04)
HX ABSOLUTE MONO COUNT: 0.92 10*3/uL — NL (ref 0.0–1.34)
HX ABSOLUTE NEUTRO COUNT: 7.71 10*3/uL — NL (ref 1.48–7.95)
HX BASOPHILS: 0.2 %
HX EOSINOPHILS: 1.5 %
HX IMMATURE GRANULOCYTES: 0.4 % — NL (ref 0.0–2.0)
HX LYMPHOCYTES: 12.5 %
HX MONOCYTES: 9.1 %
HX NEUTROPHILS: 76.3 %

## 2020-02-24 LAB — HX LACTIC ACID
CASE NUMBER: 2021219001782
HX LACTIC ACID LVL: 0.9 mmol/L — NL (ref 0.4–2.0)

## 2020-02-24 LAB — HX BASIC METABOLIC PANEL
CASE NUMBER: 2021219000159
CASE NUMBER: 21483254
HX ANION GAP: 7 — NL (ref 3.0–11.0)
HX ANION GAP: 10 — NL (ref 3.0–11.0)
HX BUN: 9 mg/dL — NL (ref 8.0–23.0)
HX BUN: 9 mg/dL — NL (ref 8.0–23.0)
HX CALCIUM LVL: 8.8 mg/dL — NL (ref 8.5–10.5)
HX CALCIUM LVL: 9.1 mg/dL — NL (ref 8.5–10.5)
HX CHLORIDE: 101 mmol/L — NL (ref 98.0–110.0)
HX CHLORIDE: 100 mmol/L — NL (ref 98.0–110.0)
HX CO2: 27 mmol/L — NL (ref 21.0–32.0)
HX CO2: 22 mmol/L — NL (ref 21.0–32.0)
HX CREATININE: 0.56 mg/dL — NL (ref 0.55–1.3)
HX CREATININE: 0.667 mg/dL — NL (ref 0.55–1.3)
HX GLUCOSE LVL: 121 mg/dL — ABNORMAL HIGH (ref 70.0–110.0)
HX GLUCOSE LVL: 126 mg/dL — ABNORMAL HIGH (ref 70.0–110.0)
HX POTASSIUM LVL: 3.8 mmol/L — NL (ref 3.6–5.2)
HX POTASSIUM LVL: 4.1 mmol/L — NL (ref 3.6–5.2)
HX SODIUM LVL: 135 mmol/L — ABNORMAL LOW (ref 136.0–146.0)
HX SODIUM LVL: 132 mmol/L — ABNORMAL LOW (ref 136.0–146.0)

## 2020-02-24 LAB — HX HEPATIC FUNCTION PANEL
CASE NUMBER: 21483254
HX ALBUMIN LVL: 3.3 g/dL — NL (ref 3.2–5.0)
HX ALKALINE PHOSPHATASE: 116 U/L — NL (ref 30.0–117.0)
HX ALT: 39 U/L — NL (ref 6.0–55.0)
HX AST: 34 U/L — NL (ref 6.0–40.0)
HX BILIRUBIN DIRECT: 0.1 mg/dL — NL (ref 0.0–0.3)
HX BILIRUBIN TOTAL: 0.4 mg/dL — NL (ref 0.2–1.2)
HX TOTAL PROTEIN: 7.1 g/dL — NL (ref 6.0–8.4)

## 2020-02-24 LAB — HX GLOMERULAR FILTRATION RATE (ESTIMATED)
CASE NUMBER: 2021219000159
HX AFN AMER GLOMERULAR FILTRATION RATE: >90
HX NON-AFN AMER GLOMERULAR FILTRATION RATE: 86 mL/min/{1.73_m2}

## 2020-02-24 LAB — HX CBC W/ INDICES
CASE NUMBER: 2021219001772
HX ABSOLUTE NRBC COUNT: 0 10*3/uL
HX HCT: 33.7 % — ABNORMAL LOW (ref 36.0–47.0)
HX HGB: 10.1 g/dL — ABNORMAL LOW (ref 11.8–16.0)
HX MCH: 21.9 pg — ABNORMAL LOW (ref 26.0–34.0)
HX MCHC: 30 g/dL — ABNORMAL LOW (ref 31.0–37.0)
HX MCV: 73.1 fL — ABNORMAL LOW (ref 80.0–100.0)
HX MPV: 10.5 fL — NL (ref 9.4–12.4)
HX NRBC PERCENT: 0 % — NL
HX PLATELET: 380 10*3/uL — NL (ref 150.0–400.0)
HX RBC: 4.61 10*6/uL — NL (ref 3.9–5.2)
HX RDW-CV: 15.9 % — ABNORMAL HIGH (ref 11.5–14.5)
HX RDW-SD: 41.2 fL — NL (ref 35.0–51.0)
HX WBC: 10.5 10*3/uL — NL (ref 3.7–11.2)

## 2020-02-24 LAB — HX TROPONIN I
CASE NUMBER: 2021219001773
HX TROPONIN I: <0.015 — NL (ref 0.015–0.045)

## 2020-02-24 LAB — HX PTT
CASE NUMBER: 2021219001780
HX APTT: 30 s — NL (ref 23.0–32.0)

## 2020-02-24 LAB — HX MAGNESIUM LEVEL
CASE NUMBER: 21480460
CASE NUMBER: 21483254
HX MAGNESIUM LVL: 2.2 mg/dL — NL (ref 1.7–2.5)
HX MAGNESIUM LVL: 2.2 mg/dL — NL (ref 1.7–2.5)

## 2020-02-24 LAB — HX PT
CASE NUMBER: 2021219001780
HX INR: 1.1
HX PT: 11.4 s — NL (ref 9.3–11.6)

## 2020-02-25 LAB — HX CBC W/ INDICES
CASE NUMBER: 2021220000147
HX ABSOLUTE NRBC COUNT: 0 10*3/uL
HX HCT: 31.3 % — ABNORMAL LOW (ref 36.0–47.0)
HX HGB: 9.4 g/dL — ABNORMAL LOW (ref 11.8–16.0)
HX MCH: 22.1 pg — ABNORMAL LOW (ref 26.0–34.0)
HX MCHC: 30 g/dL — ABNORMAL LOW (ref 31.0–37.0)
HX MCV: 73.5 fL — ABNORMAL LOW (ref 80.0–100.0)
HX MPV: 11.3 fL — NL (ref 9.4–12.4)
HX NRBC PERCENT: 0 % — NL
HX PLATELET: 292 10*3/uL — NL (ref 150.0–400.0)
HX RBC: 4.26 10*6/uL — NL (ref 3.9–5.2)
HX RDW-CV: 16 % — ABNORMAL HIGH (ref 11.5–14.5)
HX RDW-SD: 42.3 fL — NL (ref 35.0–51.0)
HX WBC: 10.1 10*3/uL — NL (ref 3.7–11.2)

## 2020-02-25 LAB — HX BASIC METABOLIC PANEL
CASE NUMBER: 2021220000147
HX ANION GAP: 9 — NL (ref 3.0–11.0)
HX BUN: 10 mg/dL — NL (ref 8.0–23.0)
HX CALCIUM LVL: 8.6 mg/dL — NL (ref 8.5–10.5)
HX CHLORIDE: 106 mmol/L — NL (ref 98.0–110.0)
HX CO2: 24 mmol/L — NL (ref 21.0–32.0)
HX CREATININE: 0.506 mg/dL — ABNORMAL LOW (ref 0.55–1.3)
HX GLUCOSE LVL: 85 mg/dL — NL (ref 70.0–110.0)
HX POTASSIUM LVL: 3.8 mmol/L — NL (ref 3.6–5.2)
HX SODIUM LVL: 139 mmol/L — NL (ref 136.0–146.0)

## 2020-02-25 LAB — HX TROPONIN I
CASE NUMBER: 2021220000050
HX TROPONIN I: <0.015 — NL (ref 0.015–0.045)

## 2020-02-25 LAB — HX GLOMERULAR FILTRATION RATE (ESTIMATED)
CASE NUMBER: 2021220000147
HX AFN AMER GLOMERULAR FILTRATION RATE: >90
HX NON-AFN AMER GLOMERULAR FILTRATION RATE: 89 mL/min/{1.73_m2}

## 2020-02-26 LAB — HX BASIC METABOLIC PANEL
CASE NUMBER: 2021221000144
HX ANION GAP: 9 — NL (ref 3.0–11.0)
HX BUN: 11 mg/dL — NL (ref 8.0–23.0)
HX CALCIUM LVL: 8.1 mg/dL — ABNORMAL LOW (ref 8.5–10.5)
HX CHLORIDE: 108 mmol/L — NL (ref 98.0–110.0)
HX CO2: 23 mmol/L — NL (ref 21.0–32.0)
HX CREATININE: 0.363 mg/dL — ABNORMAL LOW (ref 0.55–1.3)
HX GLUCOSE LVL: 69 mg/dL — ABNORMAL LOW (ref 70.0–110.0)
HX POTASSIUM LVL: 3.6 mmol/L — NL (ref 3.6–5.2)
HX SODIUM LVL: 140 mmol/L — NL (ref 136.0–146.0)

## 2020-02-26 LAB — HX CBC W/ INDICES
CASE NUMBER: 2021221000144
HX ABSOLUTE NRBC COUNT: 0 10*3/uL
HX HCT: 30.3 % — ABNORMAL LOW (ref 36.0–47.0)
HX HGB: 9.3 g/dL — ABNORMAL LOW (ref 11.8–16.0)
HX MCH: 22.9 pg — ABNORMAL LOW (ref 26.0–34.0)
HX MCHC: 30.7 g/dL — ABNORMAL LOW (ref 31.0–37.0)
HX MCV: 74.4 fL — ABNORMAL LOW (ref 80.0–100.0)
HX MPV: 11.1 fL — NL (ref 9.4–12.4)
HX NRBC PERCENT: 0 % — NL
HX PLATELET: 294 10*3/uL — NL (ref 150.0–400.0)
HX RBC: 4.07 10*6/uL — NL (ref 3.9–5.2)
HX RDW-CV: 15.9 % — ABNORMAL HIGH (ref 11.5–14.5)
HX RDW-SD: 42.9 fL — NL (ref 35.0–51.0)
HX WBC: 8.9 10*3/uL — NL (ref 3.7–11.2)

## 2020-02-26 LAB — HX GLOMERULAR FILTRATION RATE (ESTIMATED)
CASE NUMBER: 2021221000144
HX AFN AMER GLOMERULAR FILTRATION RATE: >90
HX NON-AFN AMER GLOMERULAR FILTRATION RATE: >90

## 2020-02-27 LAB — HX URINALYSIS (COMPLETE)
CASE NUMBER: 2021221003793
HX UA BILIRUBIN: NEGATIVE — NL
HX UA BLOOD: NEGATIVE — NL
HX UA GLUCOSE: NEGATIVE — NL
HX UA HYALINE CASTS: 5 /LPF — ABNORMAL HIGH (ref 0.0–4.0)
HX UA KETONES: 20 mg/dL — AB
HX UA LEUKOCYTE ESTERASE: NEGATIVE — NL
HX UA NITRITE: NEGATIVE — NL
HX UA PH: 6 — NL (ref 5.0–8.0)
HX UA PROTEIN: NEGATIVE — NL
HX UA RBC: <1 — NL (ref 0.0–2.0)
HX UA SPECIFIC GRAVITY: 1.012 — NL (ref 1.003–1.03)
HX UA SQUAMOUS EPITHELIAL: <1 — NL (ref 0.0–5.0)
HX UA UROBILINOGEN: NEGATIVE — NL
HX UA WBC: 1 /HPF — NL (ref 0.0–5.0)

## 2020-02-27 LAB — HX GLOMERULAR FILTRATION RATE (ESTIMATED)
CASE NUMBER: 2021222000113
HX AFN AMER GLOMERULAR FILTRATION RATE: >90
HX NON-AFN AMER GLOMERULAR FILTRATION RATE: >90

## 2020-02-27 LAB — HX LAVENDER TOP TO HOLD: CASE NUMBER: 2021222000113

## 2020-02-27 LAB — HX BASIC METABOLIC PANEL
CASE NUMBER: 2021222000113
HX ANION GAP: 10 — NL (ref 3.0–11.0)
HX BUN: 7 mg/dL — ABNORMAL LOW (ref 8.0–23.0)
HX CALCIUM LVL: 8.7 mg/dL — NL (ref 8.5–10.5)
HX CHLORIDE: 106 mmol/L — NL (ref 98.0–110.0)
HX CO2: 22 mmol/L — NL (ref 21.0–32.0)
HX CREATININE: 0.397 mg/dL — ABNORMAL LOW (ref 0.55–1.3)
HX GLUCOSE LVL: 94 mg/dL — NL (ref 70.0–110.0)
HX POTASSIUM LVL: 3.3 mmol/L — ABNORMAL LOW (ref 3.6–5.2)
HX SODIUM LVL: 138 mmol/L — NL (ref 136.0–146.0)

## 2020-02-28 LAB — HX BASIC METABOLIC PANEL
CASE NUMBER: 2021223000141
HX ANION GAP: 7 — NL (ref 3.0–11.0)
HX BUN: 11 mg/dL — NL (ref 8.0–23.0)
HX CALCIUM LVL: 9.3 mg/dL — NL (ref 8.5–10.5)
HX CHLORIDE: 102 mmol/L — NL (ref 98.0–110.0)
HX CO2: 27 mmol/L — NL (ref 21.0–32.0)
HX CREATININE: 0.503 mg/dL — ABNORMAL LOW (ref 0.55–1.3)
HX GLUCOSE LVL: 100 mg/dL — NL (ref 70.0–110.0)
HX POTASSIUM LVL: 4.2 mmol/L — NL (ref 3.6–5.2)
HX SODIUM LVL: 136 mmol/L — NL (ref 136.0–146.0)

## 2020-02-28 LAB — HX GLOMERULAR FILTRATION RATE (ESTIMATED)
CASE NUMBER: 2021223000141
HX AFN AMER GLOMERULAR FILTRATION RATE: >90
HX NON-AFN AMER GLOMERULAR FILTRATION RATE: 90 mL/min/{1.73_m2}

## 2020-02-28 LAB — HX LAVENDER TOP TO HOLD: CASE NUMBER: 2021223000141

## 2020-02-29 ENCOUNTER — Ambulatory Visit (HOSPITAL_BASED_OUTPATIENT_CLINIC_OR_DEPARTMENT_OTHER): Admitting: Family

## 2020-02-29 NOTE — Progress Notes (Signed)
* * *      IDA, MILBRATH **DOB:** 10/27/1934 (85 yo F) **Acc No.** [id number] **DOS:**  02/29/2020    ---        [name], [name]**    ------    5 Y old Female, DOB: July 10, 1935    [address] [place], [place] [address]    Home: [phone]    Provider: Mirna Mires        * * *    Telephone Encounter    ---    Answered by    Tona Sensing CMA, Delice Bison    Date: 02/29/2020        Time: 04:16 PM    Caller    [name] [phone]    ------            Reason    pain med            Message                      pt daughter calling for pain medication for pt. Pt had "major"surgery and states she was d/c without any pain medications.                 Action Taken                      Octavio Graves  02/29/2020 4:19:21 PM > Call to daughter to find our more info. Dr Rosanne Ashing removed ulcer and made new exit to intestines. d/c yesterday.      Rekisha Welling  02/29/2020 5:33:27 PM > Dr. Cecilie Kicks office called in Tylenol w codeine per dtr [name]                    * * *                ---          * * *          Provider: Mirna Mires 02/29/2020    ---    Note generated by e[employer]Works EMR/PM Software ([email or web address])

## 2020-03-01 ENCOUNTER — Ambulatory Visit (HOSPITAL_BASED_OUTPATIENT_CLINIC_OR_DEPARTMENT_OTHER): Admitting: Family Medicine

## 2020-03-01 ENCOUNTER — Inpatient Hospital Stay: Admit: 2020-03-01 | Disposition: A | Discharge status: Home Health | Source: Home / Self Care

## 2020-03-01 LAB — HX CBC W/ DIFF
CASE NUMBER: 2021225002048
HX ABSOLUTE NRBC COUNT: 0 10*3/uL
HX HCT: 38.4 % — NL (ref 36.0–47.0)
HX HGB: 11.7 g/dL — ABNORMAL LOW (ref 11.8–16.0)
HX MCH: 21.7 pg — ABNORMAL LOW (ref 26.0–34.0)
HX MCHC: 30.5 g/dL — ABNORMAL LOW (ref 31.0–37.0)
HX MCV: 71.1 fL — ABNORMAL LOW (ref 80.0–100.0)
HX MPV: 11.2 fL — NL (ref 9.4–12.4)
HX NRBC PERCENT: 0 % — NL
HX PLATELET: 613 10*3/uL — ABNORMAL HIGH (ref 150.0–400.0)
HX RBC: 5.4 10*6/uL — ABNORMAL HIGH (ref 3.9–5.2)
HX RDW-CV: 17.5 % — ABNORMAL HIGH (ref 11.5–14.5)
HX RDW-SD: 42.8 fL — NL (ref 35.0–51.0)
HX WBC: 14.5 10*3/uL — ABNORMAL HIGH (ref 3.7–11.2)

## 2020-03-01 LAB — HX .AUTOMATED DIFF
CASE NUMBER: 2021225002048
HX ABSOLUTE BASO COUNT: 0.02 10*3/uL — NL (ref 0.0–0.22)
HX ABSOLUTE EOS COUNT: 0.02 10*3/uL — NL (ref 0.0–0.45)
HX ABSOLUTE LYMPHS COUNT: 1.06 10*3/uL — NL (ref 0.74–5.04)
HX ABSOLUTE MONO COUNT: 0.7 10*3/uL — NL (ref 0.0–1.34)
HX ABSOLUTE NEUTRO COUNT: 12.61 10*3/uL — ABNORMAL HIGH (ref 1.48–7.95)
HX BASOPHILS: 0.1 %
HX EOSINOPHILS: 0.1 %
HX IMMATURE GRANULOCYTES: 0.5 % — NL (ref 0.0–2.0)
HX LYMPHOCYTES: 7.3 %
HX MONOCYTES: 4.8 %
HX NEUTROPHILS: 87.2 %

## 2020-03-01 LAB — HX PTT
CASE NUMBER: 2021225002048
HX APTT: 25 s — NL (ref 23.0–32.0)

## 2020-03-01 LAB — HX COMPREHENSIVE METABOLIC PANEL
CASE NUMBER: 2021225002048
HX ALBUMIN LVL: 4 g/dL — NL (ref 3.2–5.0)
HX ALKALINE PHOSPHATASE: 117 U/L — NL (ref 30.0–117.0)
HX ALT: 25 U/L — NL (ref 6.0–55.0)
HX ANION GAP: 12 — ABNORMAL HIGH (ref 3.0–11.0)
HX AST: 22 U/L — NL (ref 6.0–40.0)
HX BILIRUBIN TOTAL: 0.4 mg/dL — NL (ref 0.2–1.2)
HX BUN: 33 mg/dL — ABNORMAL HIGH (ref 8.0–23.0)
HX CALCIUM LVL: 10.2 mg/dL — NL (ref 8.5–10.5)
HX CHLORIDE: 87 mmol/L — ABNORMAL LOW (ref 98.0–110.0)
HX CO2: 30 mmol/L — NL (ref 21.0–32.0)
HX CREATININE: 0.929 mg/dL — NL (ref 0.55–1.3)
HX GLUCOSE LVL: 148 mg/dL — ABNORMAL HIGH (ref 70.0–110.0)
HX POTASSIUM LVL: 3.1 mmol/L — ABNORMAL LOW (ref 3.6–5.2)
HX SODIUM LVL: 129 mmol/L — ABNORMAL LOW (ref 136.0–146.0)
HX TOTAL PROTEIN: 8.4 g/dL — NL (ref 6.0–8.4)

## 2020-03-01 LAB — HX LIPASE LEVEL
CASE NUMBER: 2021225002048
HX LIPASE LVL: 268 U/L — NL (ref 73.0–393.0)

## 2020-03-01 LAB — HX LACTIC ACID
CASE NUMBER: 2021225002052
HX LACTIC ACID LVL: 1 mmol/L — NL (ref 0.4–2.0)

## 2020-03-01 LAB — HX PT
CASE NUMBER: 2021225002048
HX INR: 1.2
HX PT: 12.5 s — ABNORMAL HIGH (ref 9.3–11.6)

## 2020-03-01 LAB — HX ABO/RH TYPE
CASE NUMBER: 2021225002048
HX ABO/RH TYPE: A POS — NL

## 2020-03-01 LAB — HX SST GOLD TUBE TO HOLD: CASE NUMBER: 2021225002219

## 2020-03-01 LAB — HX COVID19 BY PCR (LGH)
CASE NUMBER: 2021225002851
HX COVID19 BY PCR: NOT DETECTED

## 2020-03-01 LAB — HX GLOMERULAR FILTRATION RATE (ESTIMATED)
CASE NUMBER: 2021225002048
HX AFN AMER GLOMERULAR FILTRATION RATE: 66 mL/min/{1.73_m2}
HX NON-AFN AMER GLOMERULAR FILTRATION RATE: 57 mL/min/{1.73_m2}

## 2020-03-01 LAB — HX TROPONIN I
CASE NUMBER: 2021225002048
HX TROPONIN I: <0.015 — NL (ref 0.015–0.045)

## 2020-03-01 LAB — HX ANTIBODY SCREEN
CASE NUMBER: 2021225002048
HX ANTIBODY SCREEN AUTOMATED: NEGATIVE — NL

## 2020-03-01 NOTE — Progress Notes (Signed)
 Name :  [name], [name]    DOB :  [id number]    Sex :  Female    MRN :  [id number]    Subjective    seen at noon    much worse over last 12 hours    dry heaving/vomiting    + flattus, anxious    not much pain    Objective    Vitals & Measurements    **T: **97.7 ??F (Oral) **HR: **108(Peripheral) **RR: **20 **BP: **150/62  **SpO2: **96%    **HT: **160 cm **WT: **51 Kg **BMI: **19.92    aao    abd distended    min tenderness    dressing clean    JT in place    TF held    Impression and Plan    abdominal pain (Complaint of)    s/p partial gastrect 8/3    redo anastomosis 8/24    w tachycardia and elevated wbc, concer for leak    NGT, CT abd      heparin, Dose: 5,000 Unit(s) = 1 mL, Injection, sc, q8hr, First Dose  Date/Time: 03/13/20 12:00:00 EDT    Sodium Chloride 0.9% 1,000 mL, Dose: 1,000 mL, Soln, IV, Routine, 150 ml/hr,  6.7 hr, 1,000 ml (TOTAL VOLUME), First dose date/time: 03/18/20 12:41:00 EDT    CT Abdomen and Pelvis C+    Nasogastric/Orogastric Tube Insertion    Nasogastric/Orogastric Tube Insertion        Medications    _Inpatient_    amitriptyline, 100 mg= 2 tab(s), PO, HS    bacitracin-polymyxin B topical, 1 EA, TOP, ud, PRN    Cepacol Regular Strength lozenge, 1 EA, PO, ud, PRN    Chloraseptic 1.4% spray, 1 spray(s), PO, q2hr, PRN    heparin, 5000 Unit(s)= 1 mL, sc, q8hr    levothyroxine, 125 mcg= 1 tab(s), PO, Daily    metoclopramide, 10 mg= 2 mL, IV Push, q6hr    morPHINE, 3 mg= 0.75 mL, IV Push, q2hr, PRN    morPHINE, 2 mg= 1 mL, IV Push, q2hr, PRN    morPHINE, 1 mg= 0.5 mL, IV Push, q2hr, PRN    normal saline 1,000 mL, 1000 mL, IV    ondansetron, 4 mg= 2 mL, IV Push, q6hr, PRN    ondansetron, 4 mg= 2 mL, IV Push, q6hr, PRN    Protonix, 40 mg= 1 EA, IV Push, q24hr    Sodium Chloride 0.9% Flush, 10 mL, Flush, q12hr    Sodium Chloride 0.9% Flush, 10 mL, Flush, ud, PRN    Tylenol 325 mg oral tablet, 650 mg= 2 tab(s), PO, q4hr, PRN    Lab Results    Blood Glucose POC: 119 mg/dL High (11/94/17 40:81:44)     Glucose Lvl: 158 mg/dL High (81/85/63 14:97:02)    BUN: 16 mg/dL (63/78/58 85:02:77)    Creatinine: 0.447 mg/dL Low (41/28/78 67:67:20)    Afn Amer Glomerular Filtration Rate: >90 (03/18/20 05:38:00)    Non-Afn Amer Glomerular Filtration Rate: >90 (03/18/20 05:38:00)    Sodium Lvl: 137 mmol/L (03/18/20 05:38:00)    Potassium Lvl: 4.8 mmol/L (03/18/20 05:38:00)    Chloride: 107 mmol/L (03/18/20 05:38:00)    CO2: 25 mmol/L (03/18/20 05:38:00)    Anion Gap: 5 (03/18/20 05:38:00)    Total Protein: 5.2 Gm/dL Low (94/70/96 28:36:62)    Albumin Lvl: 2.1 Gm/dL Low (94/76/54 65:03:54)    Calcium Lvl: 8.1 mg/dL Low (65/68/12 75:17:00)    Phosphorus: 3.5 mg/dL (17/49/44 96:75:91)    Magnesium  Lvl: 2.1 mg/dL (25/42/70 62:37:62)    Bilirubin Total: 0.3 mg/dL (83/15/17 61:60:73)    Alkaline Phosphatase: 118 Units/L High (03/18/20 05:38:00)    AST: 14 Units/L (03/18/20 05:38:00)    ALT: 18 Units/L (03/18/20 05:38:00)    WBC: 19.3 thous/mm3 High (03/18/20 05:38:00)    RBC: 4.29 Mil/mm3 (03/18/20 05:38:00)    Hgb: 9.2 Gm/dL Low (71/06/26 94:85:46)    Hct: 30.7 % Low (03/18/20 05:38:00)    Platelet: 331 thous/mm3 (03/18/20 05:38:00)    MCV: 71.6 fL Low (03/18/20 05:38:00)    MCH: 21.4 pGm Low (03/18/20 05:38:00)    MCHC: 30 Gm/dL Low (27/03/50 09:38:18)    RDW-SD: 47.1 fL (03/18/20 05:38:00)    MPV: 11.3 fL (03/18/20 05:38:00)    Absolute Neutro Count: 17.25 thous/mm3 High (03/18/20 05:38:00)    Absolute Lymphs Count: 0.75 thous/mm3 (03/18/20 05:38:00)    Absolute Mono Count: 1.19 thous/mm3 (03/18/20 05:38:00)    Absolute Eos Count: 0.01 thous/mm3 (03/18/20 05:38:00)    Absolute Baso Count: 0.03 thous/mm3 (03/18/20 05:38:00)    Neutrophils: 89 % (03/18/20 05:38:00)    Lymphocytes: 3.9 % (03/18/20 05:38:00)    Monocytes: 6.2 % (03/18/20 05:38:00)    Eosinophils: 0.1 % (03/18/20 05:38:00)    Basophils: 0.2 % (03/18/20 05:38:00)    Immature Granulocytes: 0.6 % (03/18/20 05:38:00)    NRBC Percent: 0 % (03/18/20 05:38:00)    Absolute  NRBC Count: 0 thous/mm3 (03/18/20 05:38:00)    Diagnostic Results        ------        SIGNATURE LINE Electronically signed by Donny Pique MD, [place] on 03/18/2020 at  19:36:34 EST

## 2020-03-01 NOTE — Progress Notes (Signed)
 Name :  [name], [name]    DOB :  [id number]    Sex :  Female    MRN :  [id number]    Subjective    Pt is s/p B2 for GOO and revision for anastomotic stricture with persistent  post-op ileus. NGT with 1140 ml of bilious output yesterday. C/o dysuria and  urinary frequency. Had BM yesterday after erythromycin, pt refusing to take  erythromycin today due to abd cramping.    Objective    Vitals & Measurements    **T: **98.2 ??F (Oral) **HR: **96(Peripheral) **RR: **18 **BP: **139/64 **SpO2:  **95%    **HT: **160 cm **WT: **51 Kg **BMI: **19.92    abd soft, incisions C/d/I, ngt with bilious outpt    Impression and Plan    abdominal pain (Complaint of)    S/p original B2 on 8/3 and revision on 8/24 with persistent post-op ileus vs  post-op SBO. Cont NPO and NGT given high output. Hold erythromycin. UA c/w  UTI, will initiate cipro while awaiting UCx. UGI with SBFT ordered for  tomorrow per Dr. Donzetta Starch recommendation.    Acute bowel obstruction    cIPROFLOXAcin, Dose: 400 mg = 200 mL, Injection, IV Piggyback, q12hr, Urinary  Tract Infection/Pyelonephritis, 200 ml/hr, over 60 minute(s), 200 ml (TOTAL  VOLUME), First Dose Date/Time: 03/21/20 13:00:00 EDT    XR Upper GI w/ Air w/ Small Bowel        Medications    _Inpatient_    Amino Acid / Dextrose TPN 2,000 mL + M.V.I.-12 10 mL + Trace Metals 1 mL    Amino Acid / Dextrose TPN 2,000 mL + M.V.I.-12 10 mL + Trace Metals 1 mL    amitriptyline, 100 mg= 2 tab(s), PO, HS    bacitracin-polymyxin B topical, 1 EA, TOP, ud, PRN    Cepacol Regular Strength lozenge, 1 EA, PO, ud, PRN    Chloraseptic 1.4% spray, 1 spray(s), PO, q2hr, PRN    ciprofloxacin in D5W 400mg /246ml Premix, 400 mg= 200 mL, IV Piggyback, q12hr    erythromycin 250mg  ec tablet, 250 mg= 1 tab(s), PO, q6hr    heparin, 5000 Unit(s)= 1 mL, sc, q8hr    levothyroxine, 125 mcg= 1 tab(s), PO, Daily    Lipids 20% intravenous 250 mL, 250 mL, IV    metoclopramide, 10 mg= 2 mL, IV Push, q6hr    morPHINE, 3 mg= 0.75 mL,  IV Push, q2hr, PRN    morPHINE, 2 mg= 1 mL, IV Push, q2hr, PRN    morPHINE, 1 mg= 0.5 mL, IV Push, q2hr, PRN    ondansetron, 4 mg= 2 mL, IV Push, q6hr, PRN    ondansetron, 4 mg= 2 mL, IV Push, q6hr, PRN    Protonix, 40 mg= 1 EA, IV Push, q24hr    Sodium Chloride 0.9% Flush, 10 mL, Flush, q12hr    Sodium Chloride 0.9% Flush, 10 mL, Flush, ud, PRN    Tylenol 325 mg oral tablet, 650 mg= 2 tab(s), PO, q4hr, PRN    Lab Results    Blood Glucose POC: 161 mg/dL High (56/38/75 64:33:29)    Glucose Lvl: 205 mg/dL High (51/88/41 66:06:30)    BUN: 17 mg/dL (16/01/09 32:35:57)    Creatinine: 0.371 mg/dL Low (32/20/25 42:70:62)    Afn Amer Glomerular Filtration Rate: >90 (03/21/20 06:10:00)    Non-Afn Amer Glomerular Filtration Rate: >90 (03/21/20 06:10:00)    Sodium Lvl: 134 mmol/L Low (03/21/20 06:10:00)    Potassium Lvl: 4.3 mmol/L (03/21/20 06:10:00)  Chloride: 101 mmol/L (03/21/20 06:10:00)    CO2: 25 mmol/L (03/21/20 06:10:00)    Anion Gap: 8 (03/21/20 06:10:00)    Total Protein: 5 Gm/dL Low (09/32/35 57:32:20)    Albumin Lvl: 2 Gm/dL Low (25/42/70 62:37:62)    Calcium Lvl: 7.6 mg/dL Low (83/15/17 61:60:73)    Phosphorus: 3.6 mg/dL (71/06/26 94:85:46)    Magnesium Lvl: 2.6 mg/dL High (27/03/50 09:38:18)    Bilirubin Total: 0.2 mg/dL (29/93/71 69:67:89)    Alkaline Phosphatase: 107 Units/L (03/21/20 06:10:00)    AST: 14 Units/L (03/21/20 06:10:00)    ALT: 15 Units/L (03/21/20 06:10:00)    WBC: 13 thous/mm3 High (03/21/20 06:10:00)    RBC: 3.42 Mil/mm3 Low (03/21/20 06:10:00)    Hgb: 7.3 Gm/dL Low (38/10/17 51:02:58)    Hct: 24.5 % Low (03/21/20 06:10:00)    Platelet: 319 thous/mm3 (03/21/20 06:10:00)    MCV: 71.6 fL Low (03/21/20 06:10:00)    MCH: 21.3 pGm Low (03/21/20 06:10:00)    MCHC: 29.8 Gm/dL Low (52/77/82 42:35:36)    RDW-SD: 47.9 fL (03/21/20 06:10:00)    MPV: 11.5 fL (03/21/20 06:10:00)    Absolute Neutro Count: 10.02 thous/mm3 High (03/21/20 06:10:00)    Absolute Lymphs Count: 1.28 thous/mm3 (03/21/20 06:10:00)     Absolute Mono Count: 0.91 thous/mm3 (03/21/20 06:10:00)    Absolute Eos Count: 0.27 thous/mm3 (03/21/20 06:10:00)    Absolute Baso Count: 0.04 thous/mm3 (03/21/20 06:10:00)    Neutrophils: 76.7 % (03/21/20 06:10:00)    Lymphocytes: 9.8 % (03/21/20 06:10:00)    Monocytes: 7 % (03/21/20 06:10:00)    Eosinophils: 2.1 % (03/21/20 06:10:00)    Basophils: 0.3 % (03/21/20 06:10:00)    Immature Granulocytes: 4.1 % High (03/21/20 06:10:00)    NRBC Percent: 0 % (03/21/20 06:10:00)    Absolute NRBC Count: 0 thous/mm3 (03/21/20 06:10:00)    UA Color: Yellow1 (03/21/20 00:07:00)    UA Clarity: Cloudy1 Abnormal (03/21/20 00:07:00)    UA Specific Gravity: 1.015 (03/21/20 00:07:00)    UA pH: 6 (03/21/20 00:07:00)    UA Protein: 30 Abnormal (03/21/20 00:07:00)    UA Glucose: Negative1 (03/21/20 00:07:00)    UA Ketones: Negative1 (03/21/20 00:07:00)    UA Bilirubin: Negative1 (03/21/20 00:07:00)    UA Blood: Small Abnormal (03/21/20 00:07:00)    UA Urobilinogen: Negative1 (03/21/20 00:07:00)    UA Nitrite: Negative1 (03/21/20 00:07:00)    UA Leukocyte Esterase: 500 Abnormal (03/21/20 00:07:00)    UA RBC: 18 /HPF High (03/21/20 00:07:00)    UA WBC: >182 High (03/21/20 00:07:00)    UA WBC Clumps: Many Abnormal (03/21/20 00:07:00)    UA Squamous Epithelial: <1 (03/21/20 00:07:00)    UA Bacteria: 4+ Abnormal (03/21/20 00:07:00)    Diagnostic Results        ------        SIGNATURE LINE Electronically signed by Tzivia Oneil MD, Oda Kilts on 03/21/2020 at  12:54:30 EST

## 2020-03-01 NOTE — Progress Notes (Signed)
 Name :  [name], [name]    DOB :  [id number]    Sex :  Female    MRN :  [id number]    Subjective    Feeling better today.    Objective    Vitals & Measurements    **T: **98.7 ??F (Oral) **HR: **99(Peripheral) **RR: **18 **BP: **122/51 **SpO2:  **96%    **HT: **160 cm **WT: **47 Kg **BMI: **19.92    Impression and Plan    abdominal pain (Complaint of)    Acute bowel obstruction    Lactated Ringers 1,000 mL, Dose: 1,000 mL, Soln, IV, Routine, 75 ml/hr, 13.3  hr, 1,000 ml (TOTAL VOLUME), First dose date/time: 04/14/20 4:07:00 EDT    Enteral Feeds    Nasogastric/Orogastric Tube Insertion        Medications    _Inpatient_    Amino Acid / Dextrose TPN 2,000 mL + M.V.I.-12 10 mL + Trace Metals 1 mL    amitriptyline, 100 mg= 2 tab(s), PO, HS    aspirin, 81 mg= 1 tab(s), PO, Daily    bacitracin-polymyxin B topical, 1 EA, TOP, ud, PRN    Bactrim DS 800 mg-160 mg oral tablet, 1 tab(s), PO, BID    Ben Gay, 1 app, TOP, TID, PRN    Carafate, 1 Gm= 1 tab(s), PO, QID    Cepacol Regular Strength lozenge, 1 EA, PO, ud, PRN    Chloraseptic 1.4% spray, 1 spray(s), PO, q2hr, PRN    gabapentin, 300 mg= 1 cap(s), PO, q8hr    heparin, 5000 Unit(s)= 1 mL, sc, q8hr    Lactated Ringers 1,000 mL, 1000 mL, IV    levothyroxine, 125 mcg= 1 tab(s), PO, q24hr    Lidocaine 4% patch, 1 patch(es), Transderm, Daily    Lidocaine Patch Removal, 1 patch(es), Remove, q24hr    Milk of Magnesia 8% oral suspension, 2400 mg= 30 mL, PO, q6hr, PRN    morPHINE, 3 mg= 0.75 mL, IV Push, q4hr, PRN    morPHINE, 2 mg= 1 mL, IV Push, q2hr, PRN    Protonix, 40 mg= 1 EA, IV Push, BID    Reglan, 10 mg= 2 mL, IV Push, q6hr    Sodium Chloride 0.9% Flush, 10 mL, Flush, q12hr    Sodium Chloride 0.9% Flush, 10 mL, Flush, ud, PRN    Tylenol 325 mg oral tablet, 650 mg= 2 tab(s), PO, q4hr, PRN    Zofran, 4 mg= 2 mL, IV Push, q8hr, PRN    Lab Results    Blood Glucose POC: 141 mg/dL High (46/50/35 46:56:81)    Glucose Lvl: 99 mg/dL (27/51/70 01:74:94)    BUN: 14 mg/dL (49/67/59  16:38:46)    Creatinine: 0.468 mg/dL Low (65/99/35 70:17:79)    Afn Amer Glomerular Filtration Rate: >90 (04/14/20 06:43:00)    Non-Afn Amer Glomerular Filtration Rate: >90 (04/14/20 06:43:00)    Sodium Lvl: 136 mmol/L (04/14/20 06:43:00)    Potassium Lvl: 3.8 mmol/L (04/14/20 06:43:00)    Chloride: 103 mmol/L (04/14/20 06:43:00)    CO2: 24 mmol/L (04/14/20 06:43:00)    Anion Gap: 9 (04/14/20 06:43:00)    Calcium Lvl: 8.9 mg/dL (39/03/00 92:33:00)    Phosphorus: 3.3 mg/dL (76/22/63 33:54:56)    Magnesium Lvl: 1.8 mg/dL (25/63/89 37:34:28)    WBC: 17.8 thous/mm3 High (04/14/20 06:43:00)    RBC: 3.79 Mil/mm3 Low (04/14/20 06:43:00)    Hgb: 8.5 Gm/dL Low (76/81/15 72:62:03)    Hct: 28.1 % Low (04/14/20 06:43:00)    Platelet: 376 thous/mm3 (04/14/20 06:43:00)  MCV: 74.1 fL Low (04/14/20 06:43:00)    MCH: 22.4 pGm Low (04/14/20 06:43:00)    MCHC: 30.2 Gm/dL Low (06/04/51 08:02:23)    RDW-SD: 57.8 fL High (04/14/20 06:43:00)    MPV: 10.9 fL (04/14/20 06:43:00)    NRBC Percent: 0 % (04/14/20 06:43:00)    Absolute NRBC Count: 0 thous/mm3 (04/14/20 06:43:00)    COVID Source: NP Swab (04/13/20 22:47:00)    COVID19 by PCR: Not Detected Panther (04/13/20 22:47:00)    Diagnostic Results        ------        [place] LINE Electronically signed by Eusebio Friendly MD, Claire Shown on 04/23/2020 at  09:05:51 EST

## 2020-03-01 NOTE — Progress Notes (Signed)
 Name :  [name], [name]    DOB :  [id number]    Sex :  Female    MRN :  [id number]    Subjective    Occasional nausea, resolves with Zofran and Reglan, otherwise tolerating tube  feeds at goal    + BM          Objective    Vitals & Measurements    **T: **97.8 ??F (Oral) **HR: **100(Peripheral) **RR: **18 **BP: **124/55  **SpO2: **96%    **HT: **160 cm **WT: **47 Kg **BMI: **18.36    AAO    abdomen soft nontender nondistended      Impression and Plan    Acute bowel obstruction    S/p partial gastrectomy, B2 August 3    Redo anastomosis August 24    Plagued by ileus and gastroparesis    Will watch another day or 2, make sure she does not vomit prior to sending her  home          metoclopramide, Dose: 10 mg = 2 mL, Injection, IV Push, q6hr, First Dose  Date/Time: 04/18/20 11:00:00 EDT    sucralfate, Dose: 1,000 mg = 10 mL, Susp, PO, QID, First Dose Date/Time:  04/18/20 13:00:00 EDT        Medications    _Inpatient_    Amino Acid / Dextrose TPN 2,000 mL + M.V.I.-12 10 mL + Trace Metals 1 mL    amitriptyline, 100 mg= 2 tab(s), PO, HS    aspirin, 81 mg= 1 tab(s), PO, Daily    bacitracin-polymyxin B topical, 1 EA, TOP, ud, PRN    Ben Gay, 1 app, TOP, TID, PRN    Cepacol Regular Strength lozenge, 1 EA, PO, ud, PRN    Chloraseptic 1.4% spray, 1 spray(s), PO, q2hr, PRN    gabapentin, 300 mg= 1 cap(s), PO, TID    heparin, 5000 Unit(s)= 1 mL, sc, q8hr    levothyroxine, 125 mcg= 1 tab(s), PO, q24hr    Lidocaine 4% patch, 1 patch(es), Transderm, Daily    Lidocaine Patch Removal, 1 patch(es), Remove, q24hr    Milk of Magnesia 8% oral suspension, 2400 mg= 30 mL, PO, q6hr, PRN    morPHINE, 2 mg= 1 mL, IM, q2hr, PRN    Protonix, 40 mg= 1 EA, IV Push, BID    Reglan, 10 mg= 2 mL, IV Push, q6hr    Sodium Chloride 0.9% Flush, 10 mL, Flush, q12hr    Sodium Chloride 0.9% Flush, 10 mL, Flush, ud, PRN    sucralfate, 1000 mg= 10 mL, PO, QID    Tylenol 325 mg oral tablet, 650 mg= 2 tab(s), PO, q4hr, PRN    Zofran, 4 mg= 2 mL, IV Push,  q6hr    Lab Results    Blood Glucose POC: 144 mg/dL High (38/10/17 51:02:58)    Glucose Lvl: 118 mg/dL High (52/77/82 42:35:36)    BUN: 20 mg/dL (14/43/15 40:08:67)    Creatinine: 0.285 mg/dL Low (61/95/09 32:67:12)    Afn Amer Glomerular Filtration Rate: >90 (04/18/20 06:26:00)    Non-Afn Amer Glomerular Filtration Rate: >90 (04/18/20 06:26:00)    Sodium Lvl: 136 mmol/L (04/18/20 06:26:00)    Potassium Lvl: 4.1 mmol/L (04/18/20 06:26:00)    Chloride: 103 mmol/L (04/18/20 06:26:00)    CO2: 26 mmol/L (04/18/20 06:26:00)    Anion Gap: 7 (04/18/20 06:26:00)    Calcium Lvl: 8.6 mg/dL (45/80/99 83:38:25)    Phosphorus: 3.5 mg/dL (05/39/76 73:41:93)    Magnesium Lvl: 1.8 mg/dL (79/02/40 97:35:32)  Lav Top Tube to Hold: DONE (04/18/20 06:26:00)    Blue Top Tube To Hold: DONE (04/18/20 06:26:00)    Diagnostic Results        ------        Lenox Ponds LINE Electronically signed by Donny Pique MD, [place] on 04/18/2020 at  13:53:58 EST

## 2020-03-01 NOTE — Progress Notes (Signed)
 Name :  [name], [name]    DOB :  [id number]    Sex :  Female    MRN :  [id number]    Subjective    Feeling better today, no nausea since yesterday. Passing minimal flatus, no BM  yesterday.    Objective    Vitals & Measurements    **T: **98.5 ??F (Oral) **HR: **89(Peripheral) **RR: **18 **BP: **125/57 **SpO2:  **95%    **HT: **160 cm **WT: **51 Kg **BMI: **19.92    Gen: aao    Abd: soft NT ND, well healed surgical incision    Ext: no LE edema/erythema    Impression and Plan    abdominal pain (Complaint of)    Acute bowel obstruction    metoclopramide, Dose: 10 mg = 2 mL, Injection, IV Push, q6hr, First Dose  Date/Time: 03/10/20 9:00:00 EDT    NPO Pending    Continues to have GOO at [facility].    continue clears    continue TPN    increase reglan 10mg  IV q6    repeat UGI tomorrow    Continue protonix    Continue sqHep        Medications    _Inpatient_    Amino Acid / Dextrose TPN 1,500 mL + M.V.I.-12 10 mL + Trace Metals 1 mL    Amino Acid / Dextrose TPN 1,500 mL + Trace Metals 1 mL + M.V.I.-12 10 mL    amitriptyline, 100 mg= 2 tab(s), PO, HS    aspirin, 81 mg= 1 tab(s), PO, Daily    bacitracin-polymyxin B topical, 1 EA, TOP, ud, PRN    Cepacol Regular Strength lozenge, 1 EA, PO, ud, PRN    Chloraseptic 1.4% spray, 1 spray(s), PO, q2hr, PRN    heparin, 5000 Unit(s)= 1 mL, sc, q8hr    levothyroxine, 125 mcg= 1 tab(s), PO, Daily    Lipids 20% intravenous 200 mL, 200 mL, IV    metoclopramide, 10 mg= 2 mL, IV Push, q6hr    morPHINE, 2 mg= 0.5 mL, IV Push, q2hr, PRN    morPHINE, 1 mg= 0.5 mL, IV Push, q2hr, PRN    ondansetron, 4 mg= 2 mL, IV Push, q6hr, PRN    Protonix, 40 mg= 1 EA, IV Push, q24hr    Sodium Chloride 0.9% Flush, 10 mL, Flush, q12hr    Sodium Chloride 0.9% Flush, 10 mL, Flush, ud, PRN    Tylenol 325 mg oral tablet, 650 mg= 2 tab(s), PO, q4hr, PRN    Lab Results    Blood Glucose POC: 127 mg/dL High (59/45/85 92:92:44)    Glucose Lvl: 131 mg/dL High (62/86/38 17:71:16)    BUN: 24 mg/dL High (57/90/38 33:38:32)     Creatinine: 0.432 mg/dL Low (91/91/66 06:00:45)    Afn Amer Glomerular Filtration Rate: >90 (03/10/20 08:32:00)    Non-Afn Amer Glomerular Filtration Rate: >90 (03/10/20 08:32:00)    Sodium Lvl: 136 mmol/L (03/10/20 08:32:00)    Potassium Lvl: 4.5 mmol/L (03/10/20 08:32:00)    Chloride: 103 mmol/L (03/10/20 08:32:00)    CO2: 27 mmol/L (03/10/20 08:32:00)    Anion Gap: 6 (03/10/20 08:32:00)    Total Protein: 6 Gm/dL (99/77/41 42:39:53)    Albumin Lvl: 2.8 Gm/dL Low (20/23/34 35:68:61)    Calcium Lvl: 8.6 mg/dL (68/37/29 02:11:15)    Phosphorus: 3.3 mg/dL (52/08/02 23:36:12)    Magnesium Lvl: 2.7 mg/dL High (24/49/75 30:05:11)    Bilirubin Total: 0.2 mg/dL (08/31/15 35:67:01)    Alkaline Phosphatase: 141 Units/L High (03/10/20  08:32:00)    AST: 45 Units/L High (03/10/20 08:32:00)    ALT: 46 Units/L (03/10/20 08:32:00)    Trig: 242 mg/dL High (11/65/79 03:83:33)    WBC: 9.9 thous/mm3 (03/10/20 08:32:00)    RBC: 4.23 Mil/mm3 (03/10/20 08:32:00)    Hgb: 9.2 Gm/dL Low (83/29/19 16:60:60)    Hct: 31.6 % Low (03/10/20 08:32:00)    Platelet: 362 thous/mm3 (03/10/20 08:32:00)    MCV: 74.7 fL Low (03/10/20 08:32:00)    MCH: 21.7 pGm Low (03/10/20 08:32:00)    MCHC: 29.1 Gm/dL Low (04/59/97 74:14:23)    RDW-SD: 47.9 fL (03/10/20 08:32:00)    MPV: 11.2 fL (03/10/20 08:32:00)    NRBC Percent: 0 % (03/10/20 08:32:00)    Absolute NRBC Count: 0 thous/mm3 (03/10/20 08:32:00)    Diagnostic Results        ------        SIGNATURE LINE Electronically signed by Eusebio Friendly MD, Claire Shown on 03/10/2020 at  11:47:30 EST

## 2020-03-01 NOTE — Progress Notes (Signed)
* * *      [name], [name] **DOB:** 06-09-1935 (85 yo F) **Acc No.** [id number] **DOS:**  03/01/2020    ---        [name], [name]**    ------    46 Y old Female, DOB: 02-Jan-1935    [address] [place], [place] [address]    Home: [phone]    Provider: Cheryle Horsfall        * * *    Telephone Encounter    ---    Answered by    Wilfrid Lund    Date: 03/01/2020        Time: 04:39 PM    Caller    [facility] left VM    ------            Reason    update            Message                      message states pt was referred to them from [facility] 8/12 to be seen for PT eval today.  They called pt, daughter states she is in pain, so they couldn't do the PT eval today, will try again for Monday.      callback # [phone]                Action Taken                      Cheryle Horsfall 03/03/2020 1:47:29 PM > noted PH                    * * *                ---          * * *          Provider: Cheryle Horsfall 03/01/2020    ---    Note generated by eClinicalWorks EMR/PM Software ([email or web address])

## 2020-03-01 NOTE — Progress Notes (Signed)
 Name :  [name], [name]    DOB :  [id number]    Sex :  Female    MRN :  [id number]    Subjective    POD#38/18    Still has nausea, but no vomiting.    Abdomen a bit distended. She is passing flatus and stool and seems to be  tolerating her tube feeds.    Still with delayed gastric emptying after partial gastrectomy and revision of  anastomosis.    Continue current management.    Objective    Vitals & Measurements    **T: **97.8 ??F (Oral) **HR: **106(Peripheral) **RR: **18 **BP: **149/61  **SpO2: **97%    **HT: **160 cm **WT: **51 Kg **BMI: **19.92    Impression and Plan    abdominal pain (Complaint of)    Acute bowel obstruction        Medications    _Inpatient_    amitriptyline, 100 mg= 2 tab(s), PO, HS    aspirin, 81 mg= 1 tab(s), PO, Daily    bacitracin-polymyxin B topical, 1 EA, TOP, ud, PRN    Cepacol Regular Strength lozenge, 1 EA, PO, ud, PRN    Chloraseptic 1.4% spray, 1 spray(s), PO, q2hr, PRN    erythromycin 250mg  ec tablet, 250 mg= 6.25 mL, NG, q6hr    free water bolus, 150 mL, FTUBE, QID    heparin, 5000 Unit(s)= 1 mL, sc, q8hr    levothyroxine, 125 mcg= 1 tab(s), PO, q24hr    metoclopramide, 10 mg= 2 mL, IV Push, q6hr    Milk of Magnesia 8% oral suspension, 2400 mg= 30 mL, PO, q6hr, PRN    morPHINE, 3 mg= 0.75 mL, IV Push, q4hr, PRN    morPHINE, 2 mg= 1 mL, IV Push, q2hr, PRN    morPHINE, 1 mg= 0.5 mL, IV Push, q2hr, PRN    Protonix, 40 mg= 1 EA, IV Push, q24hr    Sodium Chloride 0.9% Flush, 10 mL, Flush, q12hr    Sodium Chloride 0.9% Flush, 10 mL, Flush, ud, PRN    Tylenol 325 mg oral tablet, 650 mg= 2 tab(s), PO, q4hr, PRN    Zofran, 4 mg= 2 mL, IV Push, q6hr    Lab Results    Blood Glucose POC: 120 mg/dL High (92/11/94 17:40:81)    Glucose Lvl: 109 mg/dL (44/81/85 63:14:97)    BUN: 17 mg/dL (02/63/78 58:85:02)    Creatinine: 0.413 mg/dL Low (77/41/28 78:67:67)    Afn Amer Glomerular Filtration Rate: >90 (03/30/20 06:48:00)    Non-Afn Amer Glomerular Filtration Rate: >90 (03/30/20 06:48:00)     Sodium Lvl: 135 mmol/L Low (03/30/20 06:48:00)    Potassium Lvl: 4.6 mmol/L (03/30/20 06:48:00)    Chloride: 101 mmol/L (03/30/20 06:48:00)    CO2: 26 mmol/L (03/30/20 06:48:00)    Anion Gap: 8 (03/30/20 06:48:00)    Total Protein: 6 Gm/dL (20/94/70 96:28:36)    Albumin Lvl: 2.8 Gm/dL Low (62/94/76 54:65:03)    Calcium Lvl: 8.2 mg/dL Low (54/65/68 12:75:17)    Phosphorus: 2.9 mg/dL (00/17/49 44:96:75)    Magnesium Lvl: 2.6 mg/dL High (91/63/84 66:59:93)    Bilirubin Total: 0.3 mg/dL (57/01/77 93:90:30)    Alkaline Phosphatase: 158 Units/L High (03/30/20 06:48:00)    AST: 22 Units/L (03/30/20 06:48:00)    ALT: 46 Units/L (03/30/20 06:48:00)    WBC: 20.2 thous/mm3 High (03/30/20 06:48:00)    RBC: 3.41 Mil/mm3 Low (03/30/20 06:48:00)    Hgb: 7.4 Gm/dL Low (04/11/29 07:62:26)    Hct: 24.6 % Low (  03/30/20 06:48:00)    Platelet: 419 thous/mm3 High (03/30/20 06:48:00)    MCV: 72.1 fL Low (03/30/20 06:48:00)    MCH: 21.7 pGm Low (03/30/20 06:48:00)    MCHC: 30.1 Gm/dL Low (03/70/96 43:83:81)    RDW-SD: 50.4 fL (03/30/20 06:48:00)    MPV: 11.1 fL (03/30/20 06:48:00)    Absolute Neutro Count: 17.87 thous/mm3 High (03/30/20 06:48:00)    Absolute Lymphs Count: 0.89 thous/mm3 (03/30/20 06:48:00)    Absolute Mono Count: 1.1 thous/mm3 (03/30/20 06:48:00)    Absolute Eos Count: 0.14 thous/mm3 (03/30/20 06:48:00)    Absolute Baso Count: 0.05 thous/mm3 (03/30/20 06:48:00)    Neutrophils: 88.5 % (03/30/20 06:48:00)    Lymphocytes: 4.4 % (03/30/20 06:48:00)    Monocytes: 5.4 % (03/30/20 06:48:00)    Eosinophils: 0.7 % (03/30/20 06:48:00)    Basophils: 0.2 % (03/30/20 06:48:00)    Immature Granulocytes: 0.8 % (03/30/20 06:48:00)    NRBC Percent: 0 % (03/30/20 06:48:00)    Absolute NRBC Count: 0 thous/mm3 (03/30/20 06:48:00)    Hypochromia: 1+ Abnormal (03/30/20 06:48:00)    Polychrom: 1+ Abnormal (03/30/20 06:48:00)    Anisocytosis: 2+ Abnormal (03/30/20 06:48:00)    Microcytes: 1+ Abnormal (03/30/20 06:48:00)    Teardrops: 1+  Abnormal (03/30/20 06:48:00)    Diagnostic Results        ------        SIGNATURE LINE Electronically signed by Colin Benton MD, Matty Deamer on 03/30/2020 at  21:08:13 EST

## 2020-03-01 NOTE — Progress Notes (Signed)
 Name :  [name], [name]    DOB :  [id number]    Sex :  Female    MRN :  [id number]    Subjective    doing ok    pain control suboptimal    Objective    Vitals & Measurements    **T: **97.9 ??F (Oral) **HR: **95(Peripheral) **RR: **18 **BP: **161/65 **SpO2:  **94%    **HT: **160 cm **WT: **51 Kg **BMI: **19.92    aao    abd soft    + incis tenderness    dressing w min blood tinge    Impression and Plan    abdominal pain (Complaint of)    Acute bowel obstruction    s/p gastrect BII 8/3, redo anastomosis on 8/24    mobilize    increase morphine    hold TPN as K is hi    will follow closely          heparin, Dose: 5,000 Unit(s) = 1 mL, Injection, sc, q8hr, First Dose  Date/Time: 03/13/20 12:00:00 EDT    LORazepam, Dose: 0.5 mg = 0.25 mL, Injection, IV Push, q6hr, PRN, Anxiety,  First Dose Date/Time: 03/13/20 10:35:00 EDT    morPHINE, Dose: 3 mg = 0.75 mL, Injection, IV Push, q2hr, PRN, Pain, Severe,  First Dose Date/Time: 03/13/20 10:37:00 EDT    morPHINE, Dose: 2 mg = 1 mL, Injection, IV Push, q2hr, PRN, Pain, Moderate,  First Dose Date/Time: 03/12/20 19:44:00 EDT    ondansetron, Dose: 4 mg = 2 mL, Injection, IV Push, q6hr, PRN,  Nausea/Vomiting, First Dose Date/Time: 03/12/20 19:44:00 EDT    Ambulate    Ice Chips Diet    Incentive Spirometry Initiation and Teaching    Incentive Spirometry Monitoring    Incentive Spirometry Nursing    Incentive Spirometry Nursing    Incentive Spirometry Nursing    Intake and Output    Pathology Tissue Exam Request    Suction    Surg Path Final Report    Up to Chair    Vital Signs    Vital Signs    Vital Signs        Medications    _Inpatient_    amitriptyline, 100 mg= 2 tab(s), PO, HS    Ativan, 0.5 mg= 0.25 mL, IV Push, q6hr, PRN    bacitracin-polymyxin B topical, 1 EA, TOP, ud, PRN    Cepacol Regular Strength lozenge, 1 EA, PO, ud, PRN    Chloraseptic 1.4% spray, 1 spray(s), PO, q2hr, PRN    heparin, 5000 Unit(s)= 1 mL, sc, q8hr    levothyroxine, 125 mcg= 1 tab(s), PO,  Daily    metoclopramide, 10 mg= 2 mL, IV Push, q6hr    morPHINE, 3 mg= 0.75 mL, IV Push, q2hr, PRN    morPHINE, 2 mg= 1 mL, IV Push, q2hr, PRN    morPHINE, 1 mg= 0.5 mL, IV Push, q2hr, PRN    normal saline 1,000 mL, 1000 mL, IV    ondansetron, 4 mg= 2 mL, IV Push, q6hr, PRN    ondansetron, 4 mg= 2 mL, IV Push, q6hr, PRN    Protonix, 40 mg= 1 EA, IV Push, q24hr    Sodium Chloride 0.9% Flush, 10 mL, Flush, q12hr    Sodium Chloride 0.9% Flush, 10 mL, Flush, ud, PRN    Tylenol 325 mg oral tablet, 650 mg= 2 tab(s), PO, q4hr, PRN    Lab Results    Blood Glucose POC: 141 mg/dL High (83/29/19 16:60:60)  Glucose Lvl: 133 mg/dL High (05/16/89 22:84:06)    BUN: 17 mg/dL (98/61/48 30:73:54)    Creatinine: 0.6 mg/dL (30/14/84 03:97:95)    Afn Amer Glomerular Filtration Rate: >90 (03/13/20 07:03:00)    Non-Afn Amer Glomerular Filtration Rate: 85 ml/min/1.11m2 (03/13/20 07:03:00)    Sodium Lvl: 137 mmol/L (03/13/20 07:03:00)    Potassium Lvl: 5 mmol/L (03/13/20 07:03:00)    Chloride: 106 mmol/L (03/13/20 07:03:00)    CO2: 26 mmol/L (03/13/20 07:03:00)    Anion Gap: 5 (03/13/20 07:03:00)    Total Protein: 5.3 Gm/dL Low (36/92/23 00:97:94)    Albumin Lvl: 2.4 Gm/dL Low (99/71/82 09:90:68)    Calcium Lvl: 8.3 mg/dL Low (93/40/68 40:33:53)    Phosphorus: 3.3 mg/dL (31/74/09 92:78:00)    Magnesium Lvl: 2.2 mg/dL (44/71/58 06:38:68)    Bilirubin Total: 0.2 mg/dL (54/88/30 14:15:97)    Alkaline Phosphatase: 132 Units/L High (03/13/20 07:03:00)    AST: 21 Units/L (03/13/20 07:03:00)    ALT: 49 Units/L (03/13/20 07:03:00)    Trig: 128 mg/dL (33/12/50 87:19:94)    WBC: 30.6 thous/mm3 High (03/13/20 07:03:00)    RBC: 4.3 Mil/mm3 (03/13/20 07:03:00)    Hgb: 9.5 Gm/dL Low (12/90/47 53:39:17)    Hct: 31.7 % Low (03/13/20 07:03:00)    Platelet: 348 thous/mm3 (03/13/20 07:03:00)    MCV: 73.7 fL Low (03/13/20 07:03:00)    MCH: 22.1 pGm Low (03/13/20 07:03:00)    MCHC: 30 Gm/dL Low (92/17/83 75:42:37)    RDW-SD: 47.4 fL (03/13/20 07:03:00)     MPV: 11.2 fL (03/13/20 07:03:00)    Absolute Neutro Count Manual: 28.44 thous/mm3 High (03/13/20 07:03:00)    Absolute Lymphs Count Manual: 0.61 thous/mm3 Low (03/13/20 07:03:00)    Absolute Mono Count Manual: 1.53 thous/mm3 High (03/13/20 07:03:00)    Absolute Eos Count Manual: 0 thous/mm3 (03/13/20 07:03:00)    Absolute Baso Count Manual: 0 thous/mm3 (03/13/20 07:03:00)    Neutrophils Manual: 93 % (03/13/20 07:03:00)    Lymphs Manual: 2 % (03/13/20 07:03:00)    Monos Manual: 5 % (03/13/20 07:03:00)    Eos Manual: 0 % (03/13/20 07:03:00)    Basos Manual: 0 % (03/13/20 07:03:00)    Reactive Lymphs Manual: 0 % (03/13/20 07:03:00)    NRBC Percent: 0 % (03/13/20 07:03:00)    Absolute NRBC Count: 0 thous/mm3 (03/13/20 07:03:00)    RBC Morphology: See Below Abnormal (03/13/20 07:03:00)    Hypochromia: 1+ Abnormal (03/13/20 07:03:00)    Anisocytosis: 1+ Abnormal (03/13/20 07:03:00)    Microcytes: 1+ Abnormal (03/13/20 07:03:00)    Diagnostic Results        ------        SIGNATURE LINE Electronically signed by Donny Pique MD, [place] on 03/13/2020 at  17:07:03 EST

## 2020-03-01 NOTE — Progress Notes (Signed)
 with downward drift in Hg/Hct. f/u CBC tomorrow    SIGNATURE LINE Electronically signed by Eusebio Friendly MD, Claire Shown on 03/14/2020 at  15:31:11 EST

## 2020-03-01 NOTE — Progress Notes (Signed)
Name :  [name], [name]    DOB :  [id number]    Sex :  Female    MRN :  [id number]    Subjective    Patient pulled NGT    UGI shows persistent obstruction at the G-J anastomosis    Objective    Vitals & Measurements    **T: **99.4 F (Oral) **HR: **100(Peripheral) **RR: **18 **BP: **152/54  **SpO2: **96%    **HT: **160 cm **WT: **51 Kg **BMI: **19.92    Abdomen benign    Impression and Plan    Acute bowel obstruction            s/p gastrectomy for outlet obstruction 8/3    Now with outlet obstruction        Will place PICC line    TPN tomorrow    Will need NGT replaced                      bacitracin-polymyxin B topical, Dose: 1 EA, Ointment, TOP, ud, PRN, Flush  Protocol, Other (see comment field), First Dose Date/Time: 03/05/20 14:18:00  EDT, Apply to site prn Central Line removal    sodium chloride, Dose: 10 mL, Injection, Flush, q12hr, First Dose Date/Time:  03/05/20 15:00:00 EDT, Flush unused ports with 10 mL 0.9% Sodium Chloride  every 12 hours.    sodium chloride, Dose: 10 mL, Injection, Flush, ud, PRN, Flush Protocol, First  Dose Date/Time: 03/05/20 14:18:00 EDT, Flush with 10 mL 0.9% Sodium Chloride  both BEFORE and AFTER medication and blood.    PICC Line Placement    Standing Communication Order        Medications    _Inpatient_    amitriptyline, 100 mg= 2 tab(s), PO, HS    bacitracin-polymyxin B topical, 1 EA, TOP, ud, PRN    heparin, 5000 Unit(s)= 1 mL, sc, q8hr    levothyroxine, 125 mcg= 1 tab(s), PO, Daily    morPHINE, 2 mg= 0.5 mL, IV Push, q2hr, PRN    morPHINE, 1 mg= 0.5 mL, IV Push, q2hr, PRN    NaCl 0.9% bolus 1,000 mL, 1000 mL, IV    ondansetron, 4 mg= 2 mL, IV Push, q8hr, PRN    Protonix, 40 mg= 1 EA, IV Push, q24hr    Sodium Chloride 0.9% 1,000 mL, 1000 mL, IV    Sodium Chloride 0.9% Flush, 10 mL, Flush, q12hr    Sodium Chloride 0.9% Flush, 10 mL, Flush, ud, PRN    Tylenol 325 mg oral tablet, 650 mg= 2 tab(s), PO, q4hr, PRN    Lab Results    No labs resulted in the past 24  hours.    Diagnostic Results        ------        SIGNATURE LINE Electronically signed by Leveda Anna MD, Rosalyn Gess on 03/04/2020 at  14:21:35 EST

## 2020-03-01 NOTE — Progress Notes (Signed)
 Name :  [name], [name]    DOB :  [id number]    Sex :  Female    MRN :  [id number]    Subjective    Doing very well, pain-free    Still no flatus or BM    Tolerating clamping of the NG tube, minimal output overnight    Objective    Vitals & Measurements    **T: **97.9 ??F (Oral) **HR: **100(Peripheral) **RR: **18 **BP: **157/64  **SpO2: **98%    **HT: **160 cm **WT: **51 Kg **BMI: **19.92    AAO    Abdomen soft no tenderness no distention        K3.5      Impression and Plan    abdominal pain (Complaint of)    Status post partial gastrectomy and B2 on August 3    Redo anastomosis August 24    Persistent ileus        Check abdominal x-ray today          Amino Acid / Dextrose TPN 2,000 mL + multivitamin 10 mL + trace elements 1 mL,  Dose: 2,000 mL, Soln, IV, For: 24 hr, 83.33 ml/hr, 24 hr, 2,000 ml (TOTAL  VOLUME), First dose date/time: 03/24/20 21:00:00 EDT    erythromycin, Dose: 250 mg = 6.25 mL, Susp, NG, q6hr, Other - (see comments  field), First Dose Date/Time: 03/25/20 10:00:00 EDT, put in NGT and clamp for  1hr    heparin, Dose: 5,000 Unit(s) = 1 mL, Injection, sc, q8hr, First Dose  Date/Time: 03/13/20 12:00:00 EDT    potassium chloride, Dose: 40 mEq = 30 mL, Soln, JTUBE, Once, STAT, First Dose  Date/Time: 03/25/20 9:45:00 EDT    Clamp Drain/Tube        Medications    _Inpatient_    Amino Acid / Dextrose TPN 2,000 mL + M.V.I.-12 10 mL + Trace Metals 1 mL    amitriptyline, 100 mg= 2 tab(s), PO, HS    aspirin, 81 mg= 1 tab(s), PO, Daily    bacitracin-polymyxin B topical, 1 EA, TOP, ud, PRN    Cepacol Regular Strength lozenge, 1 EA, PO, ud, PRN    Chloraseptic 1.4% spray, 1 spray(s), PO, q2hr, PRN    ciprofloxacin in D5W 400mg /296ml Premix, 400 mg= 200 mL, IV Piggyback, q12hr    erythromycin 250mg  ec tablet, 250 mg= 6.25 mL, NG, q6hr    heparin, 5000 Unit(s)= 1 mL, sc, q8hr    levothyroxine, 125 mcg= 1 tab(s), PO, Daily    metoclopramide, 10 mg= 2 mL, IV Push, q6hr    morPHINE, 3 mg= 0.75 mL, IV Push, q4hr,  PRN    morPHINE, 2 mg= 1 mL, IV Push, q2hr, PRN    morPHINE, 1 mg= 0.5 mL, IV Push, q2hr, PRN    ondansetron, 4 mg= 2 mL, IV Push, q6hr, PRN    ondansetron, 4 mg= 2 mL, IV Push, q6hr, PRN    Potassium Chloride Oral, 40 mEq= 30 mL, JTUBE, Once    Protonix, 40 mg= 1 EA, IV Push, q24hr    Sodium Chloride 0.9% Flush, 10 mL, Flush, q12hr    Sodium Chloride 0.9% Flush, 10 mL, Flush, ud, PRN    Tylenol 325 mg oral tablet, 650 mg= 2 tab(s), PO, q4hr, PRN    Lab Results    Blood Glucose POC: 106 mg/dL (30/10/40 45:91:36)    Glucose Lvl: 100 mg/dL (85/99/23 41:44:36)    BUN: 18 mg/dL (01/65/80 06:34:94)    Creatinine: 0.405 mg/dL Low (  03/25/20 06:20:00)    Afn Amer Glomerular Filtration Rate: >90 (03/25/20 06:20:00)    Non-Afn Amer Glomerular Filtration Rate: >90 (03/25/20 06:20:00)    Sodium Lvl: 138 mmol/L (03/25/20 06:20:00)    Potassium Lvl: 3.5 mmol/L Low (03/25/20 06:20:00)    Chloride: 104 mmol/L (03/25/20 06:20:00)    CO2: 29 mmol/L (03/25/20 06:20:00)    Anion Gap: 5 (03/25/20 06:20:00)    Total Protein: 5.3 Gm/dL Low (50/41/36 43:83:77)    Albumin Lvl: 2.3 Gm/dL Low (93/96/88 64:84:72)    Calcium Lvl: 8.2 mg/dL Low (02/06/81 88:33:74)    Phosphorus: 3.6 mg/dL (45/14/60 47:99:87)    Magnesium Lvl: 1.8 mg/dL (21/58/72 76:18:48)    Bilirubin Total: 0.2 mg/dL (59/27/63 94:32:00)    Alkaline Phosphatase: 150 Units/L High (03/25/20 06:20:00)    AST: 33 Units/L (03/25/20 06:20:00)    ALT: 38 Units/L (03/25/20 06:20:00)    WBC: 11 thous/mm3 (03/25/20 06:20:00)    RBC: 3.26 Mil/mm3 Low (03/25/20 06:20:00)    Hgb: 7.1 Gm/dL Low (37/94/44 61:90:12)    Hct: 23.4 % Low (03/25/20 06:20:00)    Platelet: 380 thous/mm3 (03/25/20 06:20:00)    MCV: 71.8 fL Low (03/25/20 06:20:00)    MCH: 21.8 pGm Low (03/25/20 06:20:00)    MCHC: 30.3 Gm/dL Low (22/41/14 64:31:42)    RDW-SD: 48.5 fL (03/25/20 06:20:00)    MPV: 11.6 fL (03/25/20 06:20:00)    Absolute Neutro Count: 6.97 thous/mm3 (03/25/20 06:20:00)    Absolute Lymphs Count: 1.56  thous/mm3 (03/25/20 06:20:00)    Absolute Mono Count: 1.25 thous/mm3 (03/25/20 06:20:00)    Absolute Eos Count: 0.58 thous/mm3 High (03/25/20 06:20:00)    Absolute Baso Count: 0.02 thous/mm3 (03/25/20 06:20:00)    Neutrophils: 63.5 % (03/25/20 06:20:00)    Lymphocytes: 14.2 % (03/25/20 06:20:00)    Monocytes: 11.4 % (03/25/20 06:20:00)    Eosinophils: 5.3 % (03/25/20 06:20:00)    Basophils: 0.2 % (03/25/20 06:20:00)    Immature Granulocytes: 5.4 % High (03/25/20 06:20:00)    NRBC Percent: 0.2 % High (03/25/20 06:20:00)    Absolute NRBC Count: 0.02 thous/mm3 (03/25/20 06:20:00)    Diagnostic Results        ------        SIGNATURE LINE Electronically signed by Donny Pique MD, [place] on 03/25/2020 at  10:47:38 EST

## 2020-03-01 NOTE — Progress Notes (Signed)
 Name :  [name], [name]    DOB :  [id number]    Sex :  Female    MRN :  [id number]    Subjective    doing very well    pain free    tol TF at 20 per hour    Objective    Vitals & Measurements    **T: **98.5 ??F (Oral) **HR: **100(Peripheral) **RR: **18 **BP: **139/50  **SpO2: **94%    **HT: **160 cm **WT: **51 Kg **BMI: **19.92    aao    abd soft NT ND    Impression and Plan    Acute bowel obstruction    s/p partial gastrect/B II, then redo anastomosis    start full liquids      Amino Acid / Dextrose TPN 2,000 mL + multivitamin 10 mL + trace elements 1 mL,  Dose: 2,000 mL, Soln, IV, For: 24 hr, 83.33 ml/hr, 24 hr, 2,000 ml (TOTAL  VOLUME), First dose date/time: 03/27/20 21:00:00 EDT    Full Liquid Diet        Medications    _Inpatient_    Amino Acid / Dextrose TPN 2,000 mL + M.V.I.-12 10 mL + Trace Metals 1 mL    Amino Acid / Dextrose TPN 2,000 mL + M.V.I.-12 10 mL + Trace Metals 1 mL    amitriptyline, 100 mg= 2 tab(s), PO, HS    aspirin, 81 mg= 1 tab(s), PO, Daily    bacitracin-polymyxin B topical, 1 EA, TOP, ud, PRN    Cepacol Regular Strength lozenge, 1 EA, PO, ud, PRN    Chloraseptic 1.4% spray, 1 spray(s), PO, q2hr, PRN    erythromycin 250mg  ec tablet, 250 mg= 6.25 mL, NG, q6hr    heparin, 5000 Unit(s)= 1 mL, sc, q8hr    levothyroxine, 125 mcg= 1 tab(s), PO, Daily    metoclopramide, 10 mg= 2 mL, IV Push, q6hr    morPHINE, 3 mg= 0.75 mL, IV Push, q4hr, PRN    morPHINE, 2 mg= 1 mL, IV Push, q2hr, PRN    morPHINE, 1 mg= 0.5 mL, IV Push, q2hr, PRN    ondansetron, 4 mg= 2 mL, IV Push, q6hr, PRN    ondansetron, 4 mg= 2 mL, IV Push, q6hr, PRN    Protonix, 40 mg= 1 EA, IV Push, q24hr    Sodium Chloride 0.9% Flush, 10 mL, Flush, q12hr    Sodium Chloride 0.9% Flush, 10 mL, Flush, ud, PRN    Tylenol 325 mg oral tablet, 650 mg= 2 tab(s), PO, q4hr, PRN    Lab Results    Blood Glucose POC: 152 mg/dL High (03/79/55 83:16:74)    Glucose Lvl: 102 mg/dL (25/52/58 94:83:47)    BUN: 15 mg/dL (58/30/74 60:02:98)    Creatinine:  0.352 mg/dL Low (47/30/85 69:43:70)    Afn Amer Glomerular Filtration Rate: >90 (03/27/20 06:57:47)    Non-Afn Amer Glomerular Filtration Rate: >90 (03/27/20 06:57:47)    Sodium Lvl: 140 mmol/L (03/27/20 06:57:47)    Potassium Lvl: 4 mmol/L (03/27/20 06:57:47)    Chloride: 106 mmol/L (03/27/20 06:57:47)    CO2: 28 mmol/L (03/27/20 06:57:47)    Anion Gap: 6 (03/27/20 06:57:47)    Total Protein: 5.1 Gm/dL Low (12/11/89 02:89:02)    Albumin Lvl: 2.3 Gm/dL Low (28/40/69 86:14:83)    Calcium Lvl: 8 mg/dL Low (07/35/43 01:48:40)    Phosphorus: 2.9 mg/dL (39/79/53 69:22:30)    Magnesium Lvl: 2.1 mg/dL (09/79/49 97:18:20)    Bilirubin Total: 0.2 mg/dL (99/06/89 34:06:84)  Bilirubin Total: 0.2 mg/dL (57/47/34 03:70:96)    Alkaline Phosphatase: 149 Units/L High (03/27/20 06:57:47)    AST: 29 Units/L (03/27/20 06:57:47)    ALT: 42 Units/L (03/27/20 06:57:47)    ALT: 41 Units/L (03/27/20 06:57:47)    WBC: 9 thous/mm3 (03/27/20 08:17:00)    RBC: 2.46 Mil/mm3 Low (03/27/20 08:17:00)    Hgb: 7.2 Gm/dL Low (43/83/81 84:03:75)    Hct: 20.2 % Critical (03/27/20 08:17:00)    Platelet: 314 thous/mm3 (03/27/20 08:17:00)    MCV: 82.1 fL (03/27/20 08:17:00)    MCH: 29.3 pGm (03/27/20 08:17:00)    MCHC: 35.6 Gm/dL (43/60/67 70:34:03)    RDW-SD: 62.4 fL High (03/27/20 08:17:00)    MPV: 12.2 fL (03/27/20 08:17:00)    NRBC Percent: 0 % (03/27/20 08:17:00)    Absolute NRBC Count: 0 thous/mm3 (03/27/20 08:17:00)    Diagnostic Results        ------        SIGNATURE LINE Electronically signed by Donny Pique MD, [place] on 03/27/2020 at  12:38:40 EST

## 2020-03-01 NOTE — Progress Notes (Signed)
 Name :  [name], [name]    DOB :  [id number]    Sex :  Female    MRN :  [id number]    Subjective    feels better    pain well controlled    no flattus yet    Objective    Vitals & Measurements    **T: **97.4 ??F (Oral) **HR: **100(Peripheral) **RR: **18 **BP: **138/55  **SpO2: **94%    **HT: **160 cm **WT: **51 Kg **BMI: **19.92    aao    abd soft min incis tenderness        NGT 1000 cc last 24    Impression and Plan    abdominal pain (Complaint of)    Acute bowel obstruction    s/p partial gastrect/B II 8/3, w redo anastomosis 8/24    stop TPN    start TF at trickle 10 per hour    advance when + bowel function    potassium phosphate, Dose: 20 mmol = 6.67 mL, Injection, IV Piggyback, Once,  125 ml/hr, over 4 hr, 500 ml (TOTAL VOLUME), First Dose Date/Time: 03/15/20  9:00:00 EDT        Medications    _Inpatient_    Amino Acid / Dextrose TPN 1,000 mL + M.V.I.-12 10 mL + Trace Metals 1 mL +  thiamine 100 mg    amitriptyline, 100 mg= 2 tab(s), PO, HS    bacitracin-polymyxin B topical, 1 EA, TOP, ud, PRN    Cepacol Regular Strength lozenge, 1 EA, PO, ud, PRN    Chloraseptic 1.4% spray, 1 spray(s), PO, q2hr, PRN    heparin, 5000 Unit(s)= 1 mL, sc, q8hr    levothyroxine, 125 mcg= 1 tab(s), PO, Daily    metoclopramide, 10 mg= 2 mL, IV Push, q6hr    morPHINE, 3 mg= 0.75 mL, IV Push, q2hr, PRN    morPHINE, 2 mg= 1 mL, IV Push, q2hr, PRN    morPHINE, 1 mg= 0.5 mL, IV Push, q2hr, PRN    normal saline 1,000 mL, 1000 mL, IV    ondansetron, 4 mg= 2 mL, IV Push, q6hr, PRN    ondansetron, 4 mg= 2 mL, IV Push, q6hr, PRN    potassium phosphate    Protonix, 40 mg= 1 EA, IV Push, q24hr    Sodium Chloride 0.9% Flush, 10 mL, Flush, q12hr    Sodium Chloride 0.9% Flush, 10 mL, Flush, ud, PRN    Tylenol 325 mg oral tablet, 650 mg= 2 tab(s), PO, q4hr, PRN    Lab Results    Glucose Lvl: 107 mg/dL (51/02/58 52:77:82)    BUN: 9 mg/dL (42/35/36 14:43:15)    Creatinine: 0.315 mg/dL Low (40/08/67 61:95:09)    Afn Amer Glomerular Filtration  Rate: >90 (03/15/20 07:45:00)    Non-Afn Amer Glomerular Filtration Rate: >90 (03/15/20 07:45:00)    Sodium Lvl: 136 mmol/L (03/15/20 07:45:00)    Potassium Lvl: 3.2 mmol/L Low (03/15/20 07:45:00)    Chloride: 103 mmol/L (03/15/20 07:45:00)    CO2: 26 mmol/L (03/15/20 07:45:00)    Anion Gap: 7 (03/15/20 07:45:00)    Total Protein: 4.7 Gm/dL Low (32/67/12 45:80:99)    Albumin Lvl: 2 Gm/dL Low (83/38/25 05:39:76)    Calcium Lvl: 7.6 mg/dL Low (73/41/93 79:02:40)    Phosphorus: 2.3 mg/dL Low (97/35/32 99:24:26)    Magnesium Lvl: 2.1 mg/dL (83/41/96 22:29:79)    Bilirubin Total: 0.4 mg/dL (89/21/19 41:74:08)    Alkaline Phosphatase: 109 Units/L (03/15/20 07:45:00)    AST: 14 Units/L (03/15/20  07:45:00)    ALT: 23 Units/L (03/15/20 07:45:00)    WBC: 10.7 thous/mm3 (03/15/20 07:45:00)    RBC: 3.33 Mil/mm3 Low (03/15/20 07:45:00)    Hgb: 7.2 Gm/dL Low (73/22/02 54:27:06)    Hct: 24.1 % Low (03/15/20 07:45:00)    Platelet: 223 thous/mm3 (03/15/20 07:45:00)    MCV: 72.4 fL Low (03/15/20 07:45:00)    MCH: 21.6 pGm Low (03/15/20 07:45:00)    MCHC: 29.9 Gm/dL Low (23/76/28 31:51:76)    RDW-SD: 47.7 fL (03/15/20 07:45:00)    MPV: 12 fL (03/15/20 07:45:00)    NRBC Percent: 0 % (03/15/20 07:45:00)    Absolute NRBC Count: 0 thous/mm3 (03/15/20 07:45:00)    Diagnostic Results        ------        SIGNATURE LINE Electronically signed by Donny Pique MD, [place] on 03/15/2020 at  14:42:36 EST

## 2020-03-01 NOTE — Discharge Summary (Signed)
 cc: Electa Sniff, M.D.  Rebbeca Paul, M.D.  ____________________________________________________________    DISCHARGE SUMMARY    ADMITTED:  03/01/2020  DISCHARGED:  04/20/2020    PRIMARY CARE PHYSICIAN:  Electa Sniff, M.D.    ADMITTING DIAGNOSES:  1.  Bowel obstruction.  2.  Hypothyroidism.  3.  Reflux.  4.  Depression.  5.  Anxiety.    DISCHARGE DIAGNOSES:  1.  Bowel obstruction.  2.  Hypothyroidism.  3.  Reflux.  4.  Depression.  5.  Anxiety.  6.  Malnutrition.  7.  UTI.    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  She underwent an antrectomy   and Billroth II gastrojejunostomy on 02/20/2020 and then was discharged home on   02/28/2020.  However, she came back, two days later, because of abdominal pain   and vomiting.  CT scan showed  extensive distention of the stomach.  An NG tube   was placed.  Workup showed a possible swelling at the anastomosis.  This was   managed conservatively; however, she did not improve and then she was taken to   the Operating Room for a repeat laparotomy.  After review of the CT scan showed   a possible Petersen hernia.  Intraoperatively on 03/12/2020, no Petersen hernia   was noted; however, there was extensive kinking of the efferent limb just distal  to the gastrojejunostomy.  The anastomosis was redone and jejunostomy was   placed.    After that, the patient had a very slow recovery.  She will tolerate p.o. intake  for a short while, but then she would vomit.  She was tolerating tube feeds for   a short while, but then she would vomit.  She underwent an upper endoscopy on   04/05/2020 and both afferent and efferent limbs were completely wide open.  She   underwent an upper GI with small bowel follow through that showed possible   ileus.  She underwent a CAT scan of the chest, abdomen and pelvis again on   04/07/2020 without any acute findings in the abdomen other than the contrast   would  not go through.    I have had multiple discussions with the patient at the bedside as well as her   daughter about possible transfer to a tertiary care hospital.  The case was   discussed also with Dr. Andy Gauss at [facility] and the decision was to continue  medical therapy with total parenteral nutrition and this was done.    At some point, she had some urinary symptoms.  A urine culture was on 04/07/2020  was positive for E. coli.  She was given a course of Cipro.    Ultimately, the patient improved and the tube feeds were started last week and   advanced slowly for the last five days.  She did not have any vomiting.    However, by yesterday evening, she had dry heaving at 100 of bilious fluid came   out.  However, since the tube feeds were not stopped and she continues to   tolerate them very well.  She is having no abdominal pain.  Her abdomen is   completely soft.  She is having almost daily bowel movements.    I had a lengthy discussion with the patient and her daughter about possible   discharge home and continue tube feeds and continue only with a few ice chips.  I really believe that this moderate dry heaving was mostly psychological.  The   patient is in agreement with discharge.    DISCHARGE INSTRUCTIONS:  1.  Activity:    As tolerated.  2.  Diet:  Ice chips only.  Tube feeds to run at 45 mL per hour around the   clock.  The patient may disconnect for a couple of hours a day for ambulation   and independence.  3.  Followup with me in 10 days.    ISSUES PENDING:  Ultimately, we will repeat the upper GI to ensure that the   patient's stomach is working before starting a p.o. diet.  If gastroparesis   persists, then she may be a candidate for a subtotal gastrectomy.  Will follow   as an outpatient.    LABORATORY DATA:  Most recent labs were reviewed on 04/20/2020.  Her BMP was   normal.  Electrolytes were normal.  On 04/16/2020, her LFTs were unremarkable   with albumin of 2.4.  Most recent CBC on  04/17/2020 showed a white count of 7.6.   Hemoglobin 8.2.  Platelets 380.    DISCHARGE MEDICATIONS:  She will resume all preoperative medications.    Additional prescription for Tramadol, Zofran, Reglan and sucralfate were given.    Dictated by:  Beverly Milch, M.D.    DD: 04/20/2020 11:11:42  DT: 04/20/2020 11:59:00  WM/tam  Job #: [id number]   SIGNATURE LINE    Electronically signed by Donny Pique MD, [place] on 05/06/2020 at 10:29:33 EST

## 2020-03-01 NOTE — Progress Notes (Signed)
 Name :  [name], [name]    DOB :  [id number]    Sex :  Female    MRN :  [id number]    Subjective    doing very well, no abd pain, no N&V    + flattus and BM    Objective    Vitals & Measurements    **T: **97.4 ??F (Oral) **HR: **96(Peripheral) **RR: **16 **BP: **141/57 **SpO2:  **98%    **HT: **160 cm **WT: **51 Kg **BMI: **19.92    aao    abd soft NT ND    incis CDI    staples removed    Impression and Plan    Acute bowel obstruction    s/p partial gastrec B II8/3, redo anastomosis 8/24    ileus resolving    start TF at 20.hr    continue TPN    start PO in am          Amino Acid / Dextrose TPN 2,000 mL + multivitamin 10 mL + trace elements 1 mL,  Dose: 2,000 mL, Soln, IV, For: 24 hr, 83.33 ml/hr, 24 hr, 2,000 ml (TOTAL  VOLUME), First dose date/time: 03/26/20 21:00:00 EDT    fat emulsion, intravenous 250 mL, Dose: 250 mL, Emulsion, IV, For: 12 hr,  20.83 ml/hr, 250 ml (TOTAL VOLUME), First Dose Date/Time: 03/26/20 21:00:00  EDT    Discharge VNA evaluation    Enteral Feeds    Home Health Services    Skilled Nursing Care        Medications    _Inpatient_    Amino Acid / Dextrose TPN 2,000 mL + M.V.I.-12 10 mL + Trace Metals 1 mL    Amino Acid / Dextrose TPN 2,000 mL + M.V.I.-12 10 mL + Trace Metals 1 mL    amitriptyline, 100 mg= 2 tab(s), PO, HS    aspirin, 81 mg= 1 tab(s), PO, Daily    bacitracin-polymyxin B topical, 1 EA, TOP, ud, PRN    Cepacol Regular Strength lozenge, 1 EA, PO, ud, PRN    Chloraseptic 1.4% spray, 1 spray(s), PO, q2hr, PRN    ciprofloxacin in D5W 400mg /228ml Premix, 400 mg= 200 mL, IV Piggyback, q12hr    erythromycin 250mg  ec tablet, 250 mg= 6.25 mL, NG, q6hr    heparin, 5000 Unit(s)= 1 mL, sc, q8hr    levothyroxine, 125 mcg= 1 tab(s), PO, Daily    Lipids 20% intravenous 250 mL, 250 mL, IV    metoclopramide, 10 mg= 2 mL, IV Push, q6hr    morPHINE, 3 mg= 0.75 mL, IV Push, q4hr, PRN    morPHINE, 2 mg= 1 mL, IV Push, q2hr, PRN    morPHINE, 1 mg= 0.5 mL, IV Push, q2hr, PRN    ondansetron, 4 mg= 2  mL, IV Push, q6hr, PRN    ondansetron, 4 mg= 2 mL, IV Push, q6hr, PRN    Protonix, 40 mg= 1 EA, IV Push, q24hr    Sodium Chloride 0.9% Flush, 10 mL, Flush, q12hr    Sodium Chloride 0.9% Flush, 10 mL, Flush, ud, PRN    Tylenol 325 mg oral tablet, 650 mg= 2 tab(s), PO, q4hr, PRN    Lab Results    Blood Glucose POC: 130 mg/dL High (87/86/76 72:09:47)    Glucose Lvl: 96 mg/dL (09/62/83 66:29:47)    BUN: 18 mg/dL (65/46/50 35:46:56)    Creatinine: 0.39 mg/dL Low (81/27/51 70:01:74)    Afn Amer Glomerular Filtration Rate: >90 (03/26/20 06:20:00)    Non-Afn Amer Glomerular Filtration  Rate: >90 (03/26/20 06:20:00)    Sodium Lvl: 139 mmol/L (03/26/20 06:20:00)    Potassium Lvl: 3.8 mmol/L (03/26/20 06:20:00)    Chloride: 105 mmol/L (03/26/20 06:20:00)    CO2: 27 mmol/L (03/26/20 06:20:00)    Anion Gap: 7 (03/26/20 06:20:00)    Total Protein: 5.6 Gm/dL Low (93/40/68 40:33:53)    Albumin Lvl: 2.5 Gm/dL Low (31/74/09 92:78:00)    Calcium Lvl: 8.2 mg/dL Low (44/71/58 06:38:68)    Phosphorus: 3.5 mg/dL (54/88/30 14:15:97)    Magnesium Lvl: 2.1 mg/dL (33/12/50 87:19:94)    Bilirubin Total: 0.2 mg/dL (12/90/47 53:39:17)    Alkaline Phosphatase: 155 Units/L High (03/26/20 06:20:00)    AST: 30 Units/L (03/26/20 06:20:00)    ALT: 39 Units/L (03/26/20 06:20:00)    WBC: 9.7 thous/mm3 (03/26/20 06:20:00)    RBC: 3.35 Mil/mm3 Low (03/26/20 06:20:00)    Hgb: 7.2 Gm/dL Low (92/17/83 75:42:37)    Hct: 24.2 % Low (03/26/20 06:20:00)    Platelet: 381 thous/mm3 (03/26/20 06:20:00)    MCV: 72.2 fL Low (03/26/20 06:20:00)    MCH: 21.5 pGm Low (03/26/20 06:20:00)    MCHC: 29.8 Gm/dL Low (02/30/17 20:91:06)    RDW-SD: 49.3 fL (03/26/20 06:20:00)    MPV: 11.6 fL (03/26/20 06:20:00)    Absolute Neutro Count: 5.8 thous/mm3 (03/26/20 06:20:00)    Absolute Lymphs Count: 1.75 thous/mm3 (03/26/20 06:20:00)    Absolute Mono Count: 1.11 thous/mm3 (03/26/20 06:20:00)    Absolute Eos Count: 0.67 thous/mm3 High (03/26/20 06:20:00)    Absolute Baso Count: 0.03  thous/mm3 (03/26/20 06:20:00)    Neutrophils: 59.9 % (03/26/20 06:20:00)    Lymphocytes: 18 % (03/26/20 06:20:00)    Monocytes: 11.4 % (03/26/20 06:20:00)    Eosinophils: 6.9 % (03/26/20 06:20:00)    Basophils: 0.3 % (03/26/20 06:20:00)    Immature Granulocytes: 3.5 % High (03/26/20 06:20:00)    NRBC Percent: 0.2 % High (03/26/20 06:20:00)    Absolute NRBC Count: 0.02 thous/mm3 (03/26/20 06:20:00)    Diagnostic Results        ------        SIGNATURE LINE Electronically signed by Donny Pique MD, [place] on 03/26/2020 at  17:07:27 EST

## 2020-03-01 NOTE — Progress Notes (Signed)
 Name :  [name], [name]    DOB :  [id number]    Sex :  Female    MRN :  [id number]    Subjective    Feels better today than yesterday, multiple BM    More than 1600 cc from NG tube    Objective    Vitals & Measurements    **T: **97.5 ??F (Oral) **HR: **109(Peripheral) **RR: **18 **BP: **139/58  **SpO2: **95%    **HT: **160 cm **WT: **51 Kg **BMI: **19.92    AAO    Abdomen soft, minimal tenderness    Midline incision well-healed    Jejunostomy tube in place        Labs reviewed, K and mag going up    Impression and Plan    Acute bowel obstruction    Status post partial gastrectomy and B2 August 3rd, anastomosis redo August 24    Reviewed upper GI images yesterday, generalized ileus    Continue TPN, add erythromycin, add aspirin for thrombocytosis    Patient and daughter updated at length, daughter has my cell phone number          Amino Acid / Dextrose TPN 2,000 mL + multivitamin 10 mL + trace elements 1 mL,  Dose: 2,000 mL, Soln, IV, For: 24 hr, 83.33 ml/hr, 24 hr, 2,000 ml (TOTAL  VOLUME), First dose date/time: 03/23/20 21:00:00 EDT    aspirin, Dose: 81 mg, Tab, PO, Daily, First Dose Date/Time: 03/23/20 12:44:00  EDT    erythromycin, Dose: 250 mg = 6.25 mL, Susp, GTUBE, q6hr, Other - (see comments  field), First Dose Date/Time: 03/23/20 13:00:00 EDT    fat emulsion, intravenous 250 mL, Dose: 250 mL, Emulsion, IV, For: 12 hr,  20.83 ml/hr, 250 ml (TOTAL VOLUME), First Dose Date/Time: 03/23/20 21:00:00  EDT    morPHINE, Dose: 3 mg = 0.75 mL, Injection, IV Push, q4hr, PRN, Pain, Severe,  First Dose Date/Time: 03/13/20 10:37:00 EDT        Medications    _Inpatient_    Amino Acid / Dextrose TPN 2,000 mL + M.V.I.-12 10 mL + Trace Metals 1 mL    Amino Acid / Dextrose TPN 2,000 mL + M.V.I.-12 10 mL + Trace Metals 1 mL    amitriptyline, 100 mg= 2 tab(s), PO, HS    aspirin, 81 mg, PO, Daily    bacitracin-polymyxin B topical, 1 EA, TOP, ud, PRN    Cepacol Regular Strength lozenge, 1 EA, PO, ud, PRN    Chloraseptic 1.4%  spray, 1 spray(s), PO, q2hr, PRN    ciprofloxacin in D5W 400mg /260ml Premix, 400 mg= 200 mL, IV Piggyback, q12hr    erythromycin 250mg  ec tablet, 250 mg= 6.25 mL, GTUBE, q6hr    heparin, 5000 Unit(s)= 1 mL, sc, q8hr    levothyroxine, 125 mcg= 1 tab(s), PO, Daily    Lipids 20% intravenous 250 mL, 250 mL, IV    metoclopramide, 10 mg= 2 mL, IV Push, q6hr    morPHINE, 3 mg= 0.75 mL, IV Push, q4hr, PRN    morPHINE, 2 mg= 1 mL, IV Push, q2hr, PRN    morPHINE, 1 mg= 0.5 mL, IV Push, q2hr, PRN    ondansetron, 4 mg= 2 mL, IV Push, q6hr, PRN    ondansetron, 4 mg= 2 mL, IV Push, q6hr, PRN    Protonix, 40 mg= 1 EA, IV Push, q24hr    Sodium Chloride 0.9% Flush, 10 mL, Flush, q12hr    Sodium Chloride 0.9% Flush, 10 mL, Flush, ud,  PRN    Tylenol 325 mg oral tablet, 650 mg= 2 tab(s), PO, q4hr, PRN    Lab Results    Blood Glucose POC: 139 mg/dL High (92/34/14 43:60:16)    Glucose Lvl: 86 mg/dL (58/00/63 49:49:44)    BUN: 21 mg/dL (73/95/84 41:71:27)    Creatinine: 0.485 mg/dL Low (87/18/36 72:55:00)    Afn Amer Glomerular Filtration Rate: >90 (03/23/20 07:15:00)    Non-Afn Amer Glomerular Filtration Rate: >90 (03/23/20 07:15:00)    Sodium Lvl: 133 mmol/L Low (03/23/20 07:15:00)    Potassium Lvl: 5.2 mmol/L (03/23/20 07:15:00)    Chloride: 100 mmol/L (03/23/20 07:15:00)    CO2: 26 mmol/L (03/23/20 07:15:00)    Anion Gap: 7 (03/23/20 07:15:00)    Total Protein: 6 Gm/dL (16/42/90 37:95:58)    Albumin Lvl: 2.4 Gm/dL Low (31/67/42 55:25:89)    Calcium Lvl: 8.8 mg/dL (48/34/75 83:07:46)    Phosphorus: 4.4 mg/dL (00/29/84 73:08:56)    Magnesium Lvl: 2.4 mg/dL (94/37/00 52:59:10)    Bilirubin Total: 0.2 mg/dL (28/90/22 84:06:98)    Alkaline Phosphatase: 162 Units/L High (03/23/20 07:15:00)    AST: 23 Units/L (03/23/20 07:15:00)    ALT: 26 Units/L (03/23/20 07:15:00)    WBC: 16.3 thous/mm3 High (03/23/20 07:15:00)    RBC: 4 Mil/mm3 (03/23/20 07:15:00)    Hgb: 8.4 Gm/dL Low (61/48/30 73:54:30)    Hct: 29 % Low (03/23/20 07:15:00)    Platelet:  489 thous/mm3 High (03/23/20 07:15:00)    MCV: 72.5 fL Low (03/23/20 07:15:00)    MCH: 21 pGm Low (03/23/20 07:15:00)    MCHC: 29 Gm/dL Low (14/84/03 97:95:36)    RDW-SD: 49 fL (03/23/20 07:15:00)    MPV: 11.5 fL (03/23/20 07:15:00)    Absolute Neutro Count: 11.1 thous/mm3 High (03/23/20 07:15:00)    Absolute Lymphs Count: 1.95 thous/mm3 (03/23/20 07:15:00)    Absolute Mono Count: 1.57 thous/mm3 High (03/23/20 07:15:00)    Absolute Eos Count: 0.71 thous/mm3 High (03/23/20 07:15:00)    Absolute Baso Count: 0.09 thous/mm3 (03/23/20 07:15:00)    Neutrophils: 68 % (03/23/20 07:15:00)    Lymphocytes: 11.9 % (03/23/20 07:15:00)    Monocytes: 9.6 % (03/23/20 07:15:00)    Eosinophils: 4.4 % (03/23/20 07:15:00)    Basophils: 0.6 % (03/23/20 07:15:00)    Immature Granulocytes: 5.5 % High (03/23/20 07:15:00)    NRBC Percent: 0.1 % High (03/23/20 07:15:00)    Absolute NRBC Count: 0.02 thous/mm3 (03/23/20 07:15:00)    Diagnostic Results        ------        SIGNATURE LINE Electronically signed by Donny Pique MD, [place] on 03/23/2020 at  12:48:05 EST

## 2020-03-01 NOTE — Progress Notes (Signed)
 Name :  [name], [name]    DOB :  [id number]    Sex :  Female    MRN :  [id number]    Subjective    Doing very well, pain-free    Passing flatus, positive BM    Hungry would like to eat    Objective    Vitals & Measurements    **T: **98 ??F (Oral) **HR: **98(Peripheral) **RR: **18 **BP: **130/62 **SpO2:  **94%    **HT: **160 cm **WT: **47 Kg **BMI: **19.92    AAO    Abdomen soft no tenderness no distention        Hemoglobin 6, will transfuse 2 units    Abdominal x-ray reviewed, completely normal    Impression and Plan    abdominal pain (Complaint of)    Acute bowel obstruction    Status post gastrectomy with Billroth II on August 3, redo anastomosis August  24    Frustrating situation of ileus and bowel obstruction    Continue TPN    We will start clear liquids today and advance slowly.  If vomits will  definitely need surgery          Amino Acid / Dextrose TPN 2,000 mL + multivitamin 10 mL + trace elements 1 mL,  Dose: 2,000 mL, Soln, IV, For: 24 hr, 83.79 ml/hr, 24 hr, 2,011 ml (TOTAL  VOLUME), First dose date/time: 04/11/20 21:00:00 EDT    metoclopramide, Dose: 10 mg = 2 mL, Injection, IV Push, q6hr, First Dose  Date/Time: 04/11/20 9:00:00 EDT        Medications    _Inpatient_    Amino Acid / Dextrose TPN 1,500 mL + M.V.I.-12 10 mL + Trace Metals 1 mL    Amino Acid / Dextrose TPN 2,000 mL + M.V.I.-12 10 mL + Trace Metals 1 mL    amitriptyline, 100 mg= 2 tab(s), PO, HS    aspirin, 81 mg= 1 tab(s), PO, Daily    bacitracin-polymyxin B topical, 1 EA, TOP, ud, PRN    Bactrim DS 800 mg-160 mg oral tablet, 1 tab(s), PO, BID    Ben Gay, 1 app, TOP, TID, PRN    Carafate, 1 Gm= 1 tab(s), PO, QID    Cepacol Regular Strength lozenge, 1 EA, PO, ud, PRN    Chloraseptic 1.4% spray, 1 spray(s), PO, q2hr, PRN    gabapentin, 300 mg= 1 cap(s), PO, q8hr    heparin, 5000 Unit(s)= 1 mL, sc, q8hr    levothyroxine, 125 mcg= 1 tab(s), PO, q24hr    Milk of Magnesia 8% oral suspension, 2400 mg= 30 mL, PO, q6hr, PRN    morPHINE, 3 mg=  0.75 mL, IV Push, q4hr, PRN    morPHINE, 2 mg= 1 mL, IV Push, q2hr, PRN    Protonix, 40 mg= 1 EA, IV Push, BID    Reglan, 10 mg= 2 mL, IV Push, q6hr    Sodium Chloride 0.9% 250 mL, 250 mL, IV    Sodium Chloride 0.9% 250 mL, 250 mL, IV    Sodium Chloride 0.9% Flush, 10 mL, Flush, q12hr    Sodium Chloride 0.9% Flush, 10 mL, Flush, ud, PRN    Tylenol 325 mg oral tablet, 650 mg= 2 tab(s), PO, q4hr, PRN    Zofran, 4 mg= 2 mL, IV Push, q8hr, PRN    Lab Results    Blood Glucose POC: 120 mg/dL High (50/35/46 56:81:27)    Glucose Lvl: 141 mg/dL High (51/70/01 74:94:49)    BUN: 7 mg/dL  Low (04/11/20 09:18:00)    Creatinine: 0.402 mg/dL Low (58/68/25 74:93:55)    Afn Amer Glomerular Filtration Rate: >90 (04/11/20 09:18:00)    Non-Afn Amer Glomerular Filtration Rate: >90 (04/11/20 09:18:00)    Sodium Lvl: 138 mmol/L (04/11/20 09:18:00)    Potassium Lvl: 5.3 mmol/L High (04/11/20 09:18:00)    Chloride: 105 mmol/L (04/11/20 09:18:00)    CO2: 30 mmol/L (04/11/20 09:18:00)    Anion Gap: 3 (04/11/20 09:18:00)    Calcium Lvl: 8.4 mg/dL Low (21/74/71 59:53:96)    Phosphorus: 2.9 mg/dL (72/89/79 15:04:13)    Magnesium Lvl: 2.2 mg/dL (64/38/37 79:39:68)    WBC: 7.9 thous/mm3 (04/11/20 09:18:00)    RBC: 3.95 Mil/mm3 (04/11/20 09:18:00)    Hgb: 8.9 Gm/dL Low (86/48/47 20:72:18)    Hct: 29.3 % Low (04/11/20 09:18:00)    Platelet: 341 thous/mm3 (04/11/20 09:18:00)    MCV: 74.2 fL Low (04/11/20 09:18:00)    MCH: 22.5 pGm Low (04/11/20 09:18:00)    MCHC: 30.4 Gm/dL Low (28/83/37 44:51:46)    RDW-SD: 55 fL High (04/11/20 09:18:00)    MPV: 10.8 fL (04/11/20 09:18:00)    NRBC Percent: 0 % (04/11/20 09:18:00)    Absolute NRBC Count: 0 thous/mm3 (04/11/20 09:18:00)    Diagnostic Results        ------        SIGNATURE LINE Electronically signed by Donny Pique MD, [place] on 04/11/2020 at  14:19:55 EST

## 2020-03-01 NOTE — Progress Notes (Signed)
 Name :  [name], [name]    DOB :  [id number]    Sex :  Female    MRN :  [id number]    Subjective    extremely anxious, was dry heaving, no vomiting since Sat (6 days now)    tol TF at goal    Objective    Vitals & Measurements    **T: **97.8 ??F (Oral) **HR: **102(Peripheral) **RR: **18 **BP: **128/59  **SpO2: **94%    **HT: **160 cm **WT: **47 Kg **BMI: **18.36    aao    abd soft NT ND    Impression and Plan    Acute bowel obstruction    plan was to go home today, pt very anxious    will switch reglan/zofran to po    and watch her 1 more day          metoclopramide, Dose: 10 mg, PO, q6hr, First Dose Date/Time: 04/19/20 12:00:00  EDT    ondansetron, Dose: 4 mg, PO, q6hr, First Dose Date/Time: 04/19/20 12:00:00 EDT    pantoprazole, Dose: 40 mg = 1 tab(s), EC Tablet, PO, BID, First Dose  Date/Time: 04/19/20 16:30:00 EDT        Medications    _Inpatient_    amitriptyline, 100 mg= 2 tab(s), PO, HS    aspirin, 81 mg= 1 tab(s), PO, Daily    bacitracin-polymyxin B topical, 1 EA, TOP, ud, PRN    Ben Gay, 1 app, TOP, TID, PRN    Cepacol Regular Strength lozenge, 1 EA, PO, ud, PRN    Chloraseptic 1.4% spray, 1 spray(s), PO, q2hr, PRN    gabapentin, 300 mg= 1 cap(s), PO, TID    heparin, 5000 Unit(s)= 1 mL, sc, q8hr    levothyroxine, 125 mcg= 1 tab(s), PO, q24hr    Lidocaine 4% patch, 1 patch(es), Transderm, Daily    Lidocaine Patch Removal, 1 patch(es), Remove, q24hr    Milk of Magnesia 8% oral suspension, 2400 mg= 30 mL, PO, q6hr, PRN    morPHINE, 2 mg= 1 mL, IM, q2hr, PRN    Protonix, 40 mg= 1 tab(s), PO, BID    Reglan, 10 mg, PO, q6hr    Sodium Chloride 0.9% Flush, 10 mL, Flush, q12hr    Sodium Chloride 0.9% Flush, 10 mL, Flush, ud, PRN    sucralfate, 1000 mg= 10 mL, PO, QID    Tylenol 325 mg oral tablet, 650 mg= 2 tab(s), PO, q4hr, PRN    Zofran, 4 mg, PO, q6hr    Lab Results    Blood Glucose POC: 125 mg/dL High (61/95/09 32:67:12)    Glucose Lvl: 103 mg/dL (45/80/99 83:38:25)    BUN: 23 mg/dL (05/39/76 73:41:93)     Creatinine: 0.351 mg/dL Low (79/02/40 97:35:32)    Afn Amer Glomerular Filtration Rate: >90 (04/19/20 06:24:00)    Non-Afn Amer Glomerular Filtration Rate: >90 (04/19/20 06:24:00)    Sodium Lvl: 141 mmol/L (04/19/20 06:24:00)    Potassium Lvl: 4.3 mmol/L (04/19/20 06:24:00)    Chloride: 107 mmol/L (04/19/20 06:24:00)    CO2: 28 mmol/L (04/19/20 06:24:00)    Anion Gap: 6 (04/19/20 06:24:00)    Calcium Lvl: 8.6 mg/dL (99/24/26 83:41:96)    Phosphorus: 3.4 mg/dL (22/29/79 89:21:19)    Magnesium Lvl: 2 mg/dL (41/74/08 14:48:18)    Diagnostic Results        ------        SIGNATURE LINE Electronically signed by Donny Pique MD, [place] on 04/19/2020 at  11:50:08 EST

## 2020-03-01 NOTE — Progress Notes (Signed)
 Name :  [name], [name]    DOB :  [id number]    Sex :  Female    MRN :  [id number]    Subjective    85 yo female s/p B2 and revision for GOO with difficulty of gastric emptying  and tolerating TF. Pt is currently on a full liquid diet which she is  tolerating well without any nausea or emesis.    Objective    Vitals & Measurements    **T: **97.4 ??F (Oral) **HR: **97(Peripheral) **RR: **18 **BP: **124/53 **SpO2:  **93%    **HT: **160 cm **WT: **51 Kg **BMI: **19.92    abd with increased distension compared to yesterday, but nontender. J tube in  good position    Impression and Plan    abdominal pain (Complaint of)    85 yo female s/p B2 and revision for GOO with difficulty of gastric emptying  post-op. Cont full liquids until tomorrow per Dr. Donny Pique until he can  directly assess for diet advancement. Pt verbalized an understanding of the  plan.    Acute bowel obstruction        Medications    _Inpatient_    amitriptyline, 100 mg= 2 tab(s), PO, HS    aspirin, 81 mg= 1 tab(s), PO, Daily    bacitracin-polymyxin B topical, 1 EA, TOP, ud, PRN    Ben Gay, 1 app, TOP, TID, PRN    Carafate, 1 Gm= 1 tab(s), PO, QID    Cepacol Regular Strength lozenge, 1 EA, PO, ud, PRN    Chloraseptic 1.4% spray, 1 spray(s), PO, q2hr, PRN    D5 in 0.45% NS w/KCL 6mEq/L 1,000 mL, 1000 mL, IV    free water bolus, 150 mL, FTUBE, QID    gabapentin, 300 mg= 1 cap(s), PO, q8hr    heparin, 5000 Unit(s)= 1 mL, sc, q8hr    Lactated Ringers 1,000 mL, 1000 mL, IV    levothyroxine, 125 mcg= 1 tab(s), PO, q24hr    Milk of Magnesia 8% oral suspension, 2400 mg= 30 mL, PO, q6hr, PRN    morPHINE, 3 mg= 0.75 mL, IV Push, q4hr, PRN    morPHINE, 2 mg= 1 mL, IV Push, q2hr, PRN    Protonix, 40 mg= 1 EA, IV Push, BID    Sodium Chloride 0.9% Flush, 10 mL, Flush, q12hr    Sodium Chloride 0.9% Flush, 10 mL, Flush, ud, PRN    Tylenol 325 mg oral tablet, 650 mg= 2 tab(s), PO, q4hr, PRN    Zofran, 4 mg= 1 tab(s), PO, q6hr, PRN    Lab Results    No labs resulted  in the past 24 hours.    Diagnostic Results        ------        SIGNATURE LINE Electronically signed by Deb Loudin MD, Oda Kilts on 04/07/2020 at  12:12:12 EST

## 2020-03-01 NOTE — Progress Notes (Signed)
 Name :  [name], [name]    DOB :  [id number]    Sex :  Female    MRN :  [id number]    Subjective    vomited again last night, per RN looks like TF    pain free and N&V today    + flatus and BM    Objective    Vitals & Measurements    **T: **97.9 ??F (Oral) **HR: **104(Peripheral) **RR: **18 **BP: **132/56  **SpO2: **97%    **HT: **160 cm **WT: **51 Kg **BMI: **19.92    aao    abd soft NT ND        EGD reviewed, widely patent both limbs    Impression and Plan    Acute bowel obstruction    s/p partial gastrect/B II 8/3, redo anastomosis 8/24    ? sensitivity to the formula    DC TF and start full liquids    if vomits, rescan        dtr and pt updated at bedside          Dextrose 5% in 0.45% NaCl w/KCl 20 mEq/L 1,000 mL, Dose: 1,000 mL, Soln, IV,  Routine, 100 ml/hr, 10 hr, 1,000 ml (TOTAL VOLUME), First dose date/time:  04/05/20 7:11:00 EDT    heparin, Dose: 5,000 Unit(s) = 1 mL, Injection, sc, q8hr, First Dose  Date/Time: 03/13/20 12:00:00 EDT    morPHINE, Dose: 3 mg = 0.75 mL, Injection, IV Push, q4hr, PRN, Pain, Severe,  First Dose Date/Time: 03/13/20 10:37:00 EDT    morPHINE, Dose: 2 mg = 1 mL, Injection, IV Push, q2hr, PRN, Pain, Moderate,  First Dose Date/Time: 03/12/20 19:44:00 EDT    sucralfate, Dose: 1 Gm = 1 tab(s), Tab, PO, QID, First Dose Date/Time:  04/05/20 13:00:00 EDT    Basic Metabolic Panel    CBC w/ Diff    Full Liquid Diet    Magnesium Level        Medications    _Inpatient_    amitriptyline, 100 mg= 2 tab(s), PO, HS    aspirin, 81 mg= 1 tab(s), PO, Daily    bacitracin-polymyxin B topical, 1 EA, TOP, ud, PRN    Ben Gay, 1 app, TOP, TID, PRN    Carafate, 1 Gm= 1 tab(s), PO, QID    Cepacol Regular Strength lozenge, 1 EA, PO, ud, PRN    Chloraseptic 1.4% spray, 1 spray(s), PO, q2hr, PRN    D5 in 0.45% NS w/KCL 95mEq/L 1,000 mL, 1000 mL, IV    free water bolus, 150 mL, FTUBE, QID    gabapentin, 300 mg= 1 cap(s), PO, q8hr    heparin, 5000 Unit(s)= 1 mL, sc, q8hr    Lactated Ringers 1,000 mL, 1000  mL, IV    levothyroxine, 125 mcg= 1 tab(s), PO, q24hr    Milk of Magnesia 8% oral suspension, 2400 mg= 30 mL, PO, q6hr, PRN    morPHINE, 3 mg= 0.75 mL, IV Push, q4hr, PRN    morPHINE, 2 mg= 1 mL, IV Push, q2hr, PRN    Protonix, 40 mg= 1 EA, IV Push, BID    Sodium Chloride 0.9% Flush, 10 mL, Flush, q12hr    Sodium Chloride 0.9% Flush, 10 mL, Flush, ud, PRN    Tylenol 325 mg oral tablet, 650 mg= 2 tab(s), PO, q4hr, PRN    Zofran, 4 mg= 1 tab(s), PO, q6hr, PRN    Lab Results    SARS Source: NP Swab (04/05/20 07:24:00)    SARS CoV  2 RNA by PCR: Not Detected - [facility] (04/05/20 07:24:00)    Diagnostic Results        ------        [place] LINE Electronically signed by Donny Pique MD, [place] on 04/05/2020 at  12:57:26 EST

## 2020-03-01 NOTE — Progress Notes (Signed)
 Name :  [name], [name]    DOB :  [id number]    Sex :  Female    MRN :  [id number]    Subjective    POD#39/19    Feels much better today.    Still a bit depressed - may benefit from an anti-depressant - daughters also  inquiring about this.    Dr. Donny Pique can discuss this with her tomorrow.    Explained she will have good and bad days, and the course will be measured in  weeks to months - she understands.    Continue tube feeds for now.    Objective    Vitals & Measurements    **T: **98.0 ??F (Oral) **HR: **106(Peripheral) **RR: **18 **BP: **136/58  **SpO2: **96%    **HT: **160 cm **WT: **51 Kg **BMI: **19.92    Impression and Plan    abdominal pain (Complaint of)    Acute bowel obstruction    methyl salicylate topical, Dose: 1 app, Cream, TOP, TID, PRN, Muscle Pain,  Other (see comment field), First Dose Date/Time: 03/31/20 3:20:00 EDT, BLE    pantoprazole, Dose: 40 mg = 1 EA, Injection, IV Push, BID, First Dose  Date/Time: 03/02/20 11:00:00 EDT        Medications    _Inpatient_    amitriptyline, 100 mg= 2 tab(s), PO, HS    aspirin, 81 mg= 1 tab(s), PO, Daily    bacitracin-polymyxin B topical, 1 EA, TOP, ud, PRN    Ben Gay, 1 app, TOP, TID, PRN    Cepacol Regular Strength lozenge, 1 EA, PO, ud, PRN    Chloraseptic 1.4% spray, 1 spray(s), PO, q2hr, PRN    erythromycin 250mg  ec tablet, 250 mg= 6.25 mL, NG, q6hr    free water bolus, 150 mL, FTUBE, QID    heparin, 5000 Unit(s)= 1 mL, sc, q8hr    levothyroxine, 125 mcg= 1 tab(s), PO, q24hr    metoclopramide, 10 mg= 2 mL, IV Push, q6hr    Milk of Magnesia 8% oral suspension, 2400 mg= 30 mL, PO, q6hr, PRN    morPHINE, 3 mg= 0.75 mL, IV Push, q4hr, PRN    morPHINE, 2 mg= 1 mL, IV Push, q2hr, PRN    morPHINE, 1 mg= 0.5 mL, IV Push, q2hr, PRN    Protonix, 40 mg= 1 EA, IV Push, BID    Sodium Chloride 0.9% Flush, 10 mL, Flush, q12hr    Sodium Chloride 0.9% Flush, 10 mL, Flush, ud, PRN    Tylenol 325 mg oral tablet, 650 mg= 2 tab(s), PO, q4hr, PRN    Zofran, 4 mg= 2 mL, IV  Push, q6hr    Lab Results    Blood Glucose POC: 157 mg/dL High (05/21/10 17:35:67)    Glucose Lvl: 107 mg/dL (01/41/03 01:31:43)    BUN: 20 mg/dL (88/87/57 97:28:20)    Creatinine: 0.439 mg/dL Low (60/15/61 53:79:43)    Afn Amer Glomerular Filtration Rate: >90 (03/31/20 08:16:00)    Non-Afn Amer Glomerular Filtration Rate: >90 (03/31/20 08:16:00)    Sodium Lvl: 135 mmol/L Low (03/31/20 08:16:00)    Potassium Lvl: 4.6 mmol/L (03/31/20 08:16:00)    Chloride: 99 mmol/L (03/31/20 08:16:00)    CO2: 28 mmol/L (03/31/20 08:16:00)    Anion Gap: 8 (03/31/20 08:16:00)    Total Protein: 6.4 Gm/dL (27/61/47 09:29:57)    Albumin Lvl: 3 Gm/dL Low (47/34/03 70:96:43)    Calcium Lvl: 8.4 mg/dL Low (83/81/84 03:75:43)    Phosphorus: 3.2 mg/dL (60/67/70 34:03:52)  Magnesium Lvl: 2.6 mg/dL High (43/60/67 70:34:03)    Bilirubin Total: 0.3 mg/dL (52/48/18 59:09:31)    Alkaline Phosphatase: 146 Units/L High (03/31/20 08:16:00)    AST: 13 Units/L (03/31/20 08:16:00)    ALT: 39 Units/L (03/31/20 08:16:00)    WBC: 13.5 thous/mm3 High (03/31/20 08:16:00)    RBC: 3.54 Mil/mm3 Low (03/31/20 08:16:00)    Hgb: 7.6 Gm/dL Low (07/05/23 46:95:07)    Hct: 25.2 % Low (03/31/20 08:16:00)    Platelet: 445 thous/mm3 High (03/31/20 08:16:00)    MCV: 71.2 fL Low (03/31/20 08:16:00)    MCH: 21.5 pGm Low (03/31/20 08:16:00)    MCHC: 30.2 Gm/dL Low (22/57/50 51:83:35)    RDW-SD: 49.8 fL (03/31/20 08:16:00)    MPV: 10.6 fL (03/31/20 08:16:00)    Absolute Neutro Count: 10.59 thous/mm3 High (03/31/20 08:16:00)    Absolute Lymphs Count: 1.34 thous/mm3 (03/31/20 08:16:00)    Absolute Mono Count: 1.3 thous/mm3 (03/31/20 08:16:00)    Absolute Eos Count: 0.17 thous/mm3 (03/31/20 08:16:00)    Absolute Baso Count: 0.03 thous/mm3 (03/31/20 08:16:00)    Neutrophils: 78.4 % (03/31/20 08:16:00)    Lymphocytes: 9.9 % (03/31/20 08:16:00)    Monocytes: 9.6 % (03/31/20 08:16:00)    Eosinophils: 1.3 % (03/31/20 08:16:00)    Basophils: 0.2 % (03/31/20 08:16:00)    Immature  Granulocytes: 0.6 % (03/31/20 08:16:00)    NRBC Percent: 0 % (03/31/20 08:16:00)    Absolute NRBC Count: 0 thous/mm3 (03/31/20 08:16:00)    Diagnostic Results        ------        SIGNATURE LINE Electronically signed by Colin Benton MD, Searcy Miyoshi on 03/31/2020 at  82:51:89 EST

## 2020-03-01 NOTE — Progress Notes (Signed)
 Name :  [name], [name]    DOB :  [id number]    Sex :  Female    MRN :  [id number]    Subjective    Feeling better. Tolerating clear liquids. No nuasea, no emesis. Passing  flatus, +BM. Burning with urination yesterday.    Objective    Vitals & Measurements    **T: **98 ??F (Oral) **HR: **98(Peripheral) **RR: **18 **BP: **130/62 **SpO2:  **94%    **HT: **160 cm **WT: **47 Kg **BMI: **19.92    awake, alert, nad    abdomen soft, nt, nd, gtube in place c/d/i    Impression and Plan    abdominal pain (Complaint of)    Acute bowel obstruction    Doing well, passing flatus, +BM, tolerating clears.    Abdomen soft        continue tpn    Started on antibiotics for symptomatic UTI    DVT and GI ppx    advance to full liquids        Medications    _Inpatient_    Amino Acid / Dextrose TPN 1,500 mL + M.V.I.-12 10 mL + Trace Metals 1 mL    Amino Acid / Dextrose TPN 2,000 mL + M.V.I.-12 10 mL + Trace Metals 1 mL    amitriptyline, 100 mg= 2 tab(s), PO, HS    aspirin, 81 mg= 1 tab(s), PO, Daily    bacitracin-polymyxin B topical, 1 EA, TOP, ud, PRN    Bactrim DS 800 mg-160 mg oral tablet, 1 tab(s), PO, BID    Ben Gay, 1 app, TOP, TID, PRN    Carafate, 1 Gm= 1 tab(s), PO, QID    Cepacol Regular Strength lozenge, 1 EA, PO, ud, PRN    Chloraseptic 1.4% spray, 1 spray(s), PO, q2hr, PRN    gabapentin, 300 mg= 1 cap(s), PO, q8hr    heparin, 5000 Unit(s)= 1 mL, sc, q8hr    levothyroxine, 125 mcg= 1 tab(s), PO, q24hr    Milk of Magnesia 8% oral suspension, 2400 mg= 30 mL, PO, q6hr, PRN    morPHINE, 3 mg= 0.75 mL, IV Push, q4hr, PRN    morPHINE, 2 mg= 1 mL, IV Push, q2hr, PRN    Protonix, 40 mg= 1 EA, IV Push, BID    Reglan, 10 mg= 2 mL, IV Push, q6hr    Sodium Chloride 0.9% 250 mL, 250 mL, IV    Sodium Chloride 0.9% 250 mL, 250 mL, IV    Sodium Chloride 0.9% Flush, 10 mL, Flush, q12hr    Sodium Chloride 0.9% Flush, 10 mL, Flush, ud, PRN    Tylenol 325 mg oral tablet, 650 mg= 2 tab(s), PO, q4hr, PRN    Zofran, 4 mg= 2 mL, IV Push, q8hr,  PRN    Lab Results    Blood Glucose POC: 120 mg/dL High (97/53/00 51:10:21)    Glucose Lvl: 141 mg/dL High (11/73/56 70:14:10)    BUN: 7 mg/dL Low (30/13/14 38:88:75)    Creatinine: 0.402 mg/dL Low (79/72/82 06:01:56)    Afn Amer Glomerular Filtration Rate: >90 (04/11/20 09:18:00)    Non-Afn Amer Glomerular Filtration Rate: >90 (04/11/20 09:18:00)    Sodium Lvl: 138 mmol/L (04/11/20 09:18:00)    Potassium Lvl: 5.3 mmol/L High (04/11/20 09:18:00)    Chloride: 105 mmol/L (04/11/20 09:18:00)    CO2: 30 mmol/L (04/11/20 09:18:00)    Anion Gap: 3 (04/11/20 09:18:00)    Calcium Lvl: 8.4 mg/dL Low (15/37/94 32:76:14)    Phosphorus: 2.9 mg/dL (70/92/95  09:18:00)    Magnesium Lvl: 2.2 mg/dL (30/05/11 02:11:17)    WBC: 7.9 thous/mm3 (04/11/20 09:18:00)    RBC: 3.95 Mil/mm3 (04/11/20 09:18:00)    Hgb: 8.9 Gm/dL Low (35/67/01 41:03:01)    Hct: 29.3 % Low (04/11/20 09:18:00)    Platelet: 341 thous/mm3 (04/11/20 09:18:00)    MCV: 74.2 fL Low (04/11/20 09:18:00)    MCH: 22.5 pGm Low (04/11/20 09:18:00)    MCHC: 30.4 Gm/dL Low (31/43/88 87:57:97)    RDW-SD: 55 fL High (04/11/20 09:18:00)    MPV: 10.8 fL (04/11/20 09:18:00)    NRBC Percent: 0 % (04/11/20 09:18:00)    Absolute NRBC Count: 0 thous/mm3 (04/11/20 09:18:00)    Diagnostic Results        ------        SIGNATURE LINE Electronically signed by Eusebio Friendly MD, Claire Shown on 04/11/2020 at  15:24:05 EST

## 2020-03-01 NOTE — Progress Notes (Signed)
 Name :  [name], [name]    DOB :  [id number]    Sex :  Female    MRN :  [id number]    Subjective    reports she is feeling better. still with some nausea, no emesis. ngt in  place, clamped for UGI. passing flatus. +BM yesterday.    Objective    Vitals & Measurements    **T: **98.4 ??F (Oral) **HR: **98(Peripheral) **RR: **18 **BP: **139/53 **SpO2:  **94%    **HT: **160 cm **WT: **51 Kg **BMI: **19.92    aao    abd soft NT    mild distention    incisions CDI    Impression and Plan    abdominal pain (Complaint of)    Acute bowel obstruction    S/p gastrojej B2 for GOO on 8/3 and revision on 8/24 with persistent post-op  ileus vs post-op SBO.    Cont NPO and NGT given high output.    cipro for UTI    UGI with SBFT pending        Medications    _Inpatient_    Amino Acid / Dextrose TPN 2,000 mL + M.V.I.-12 10 mL + Trace Metals 1 mL    Amino Acid / Dextrose TPN 2,000 mL + M.V.I.-12 10 mL + Trace Metals 1 mL    amitriptyline, 100 mg= 2 tab(s), PO, HS    bacitracin-polymyxin B topical, 1 EA, TOP, ud, PRN    Cepacol Regular Strength lozenge, 1 EA, PO, ud, PRN    Chloraseptic 1.4% spray, 1 spray(s), PO, q2hr, PRN    ciprofloxacin in D5W 400mg /262ml Premix, 400 mg= 200 mL, IV Piggyback, q12hr    heparin, 5000 Unit(s)= 1 mL, sc, q8hr    levothyroxine, 125 mcg= 1 tab(s), PO, Daily    Lipids 20% intravenous 250 mL, 250 mL, IV    Lipids 20% intravenous 250 mL, 250 mL, IV    metoclopramide, 10 mg= 2 mL, IV Push, q6hr    morPHINE, 3 mg= 0.75 mL, IV Push, q2hr, PRN    morPHINE, 2 mg= 1 mL, IV Push, q2hr, PRN    morPHINE, 1 mg= 0.5 mL, IV Push, q2hr, PRN    ondansetron, 4 mg= 2 mL, IV Push, q6hr, PRN    ondansetron, 4 mg= 2 mL, IV Push, q6hr, PRN    Protonix, 40 mg= 1 EA, IV Push, q24hr    Sodium Chloride 0.9% Flush, 10 mL, Flush, q12hr    Sodium Chloride 0.9% Flush, 10 mL, Flush, ud, PRN    Tylenol 325 mg oral tablet, 650 mg= 2 tab(s), PO, q4hr, PRN    Lab Results    Blood Glucose POC: 141 mg/dL High (29/24/46 28:63:81)     Glucose Lvl: 102 mg/dL (77/11/65 79:03:83)    BUN: 17 mg/dL (33/83/29 19:16:60)    Creatinine: 0.376 mg/dL Low (60/04/59 97:74:14)    Afn Amer Glomerular Filtration Rate: >90 (03/22/20 05:12:00)    Non-Afn Amer Glomerular Filtration Rate: >90 (03/22/20 05:12:00)    Sodium Lvl: 135 mmol/L Low (03/22/20 05:12:00)    Potassium Lvl: 4.3 mmol/L (03/22/20 05:12:00)    Chloride: 105 mmol/L (03/22/20 05:12:00)    CO2: 27 mmol/L (03/22/20 05:12:00)    Anion Gap: 3 (03/22/20 05:12:00)    Total Protein: 5.1 Gm/dL Low (23/95/32 02:33:43)    Albumin Lvl: 2.1 Gm/dL Low (56/86/16 83:72:90)    Calcium Lvl: 7.7 mg/dL Low (21/11/55 20:80:22)    Phosphorus: 3.2 mg/dL (33/61/22 44:97:53)    Magnesium Lvl: 2.2  mg/dL (56/31/49 70:26:37)    Bilirubin Total: 0.2 mg/dL (85/88/50 27:74:12)    Bilirubin Total: 0.2 mg/dL (87/86/76 72:09:47)    Alkaline Phosphatase: 116 Units/L (03/22/20 05:12:00)    AST: 12 Units/L (03/22/20 05:12:00)    ALT: 16 Units/L (03/22/20 05:12:00)    ALT: 17 Units/L (03/22/20 05:12:00)    WBC: 12.8 thous/mm3 High (03/22/20 05:12:00)    RBC: 3.43 Mil/mm3 Low (03/22/20 05:12:00)    Hgb: 7.4 Gm/dL Low (09/62/83 66:29:47)    Hct: 24.2 % Low (03/22/20 05:12:00)    Platelet: 330 thous/mm3 (03/22/20 05:12:00)    MCV: 70.6 fL Low (03/22/20 05:12:00)    MCH: 21.6 pGm Low (03/22/20 05:12:00)    MCHC: 30.6 Gm/dL Low (65/46/50 35:46:56)    RDW-SD: 46.3 fL (03/22/20 05:12:00)    MPV: 11.1 fL (03/22/20 05:12:00)    Absolute Neutro Count: 9.08 thous/mm3 High (03/22/20 05:12:00)    Absolute Lymphs Count: 1.39 thous/mm3 (03/22/20 05:12:00)    Absolute Mono Count: 1.16 thous/mm3 (03/22/20 05:12:00)    Absolute Eos Count: 0.45 thous/mm3 (03/22/20 05:12:00)    Absolute Baso Count: 0.05 thous/mm3 (03/22/20 05:12:00)    Neutrophils: 70.9 % (03/22/20 05:12:00)    Lymphocytes: 10.9 % (03/22/20 05:12:00)    Monocytes: 9.1 % (03/22/20 05:12:00)    Eosinophils: 3.5 % (03/22/20 05:12:00)    Basophils: 0.4 % (03/22/20 05:12:00)    Immature  Granulocytes: 5.2 % High (03/22/20 05:12:00)    NRBC Percent: 0.2 % High (03/22/20 05:12:00)    Absolute NRBC Count: 0.02 thous/mm3 (03/22/20 05:12:00)    Diagnostic Results        ------        SIGNATURE LINE Electronically signed by Eusebio Friendly MD, Claire Shown on 03/22/2020 at  12:58:40 EST

## 2020-03-01 NOTE — Progress Notes (Signed)
 Name :  [name], [name]    DOB :  [id number]    Sex :  Female    MRN :  [id number]    Subjective    feeling much better today, no nausea, no emesis. No flatus or BM.    Objective    Vitals & Measurements    **T: **99.0 ??F (Oral) **HR: **98(Peripheral) **RR: **18 **BP: **132/57 **SpO2:  **94%    **HT: **160 cm **WT: **51 Kg **BMI: **19.92    Alert, awake, NAD    NGT in place, draining bilious fluid    Abd soft, midlly distended, dressing blood tinged, J-tube in place    Ext: no LE edema/erythema    Impression and Plan    abdominal pain (Complaint of)    Acute bowel obstruction    s/p gastrect BII 8/3, redo anastomosis on 8/24    increase ambulation    Restart TPN    Hold off on tube feeds for now    Maintain NGT        Medications    _Inpatient_    Amino Acid / Dextrose TPN 1,000 mL + M.V.I.-12 10 mL + Trace Metals 1 mL +  thiamine 100 mg    amitriptyline, 100 mg= 2 tab(s), PO, HS    bacitracin-polymyxin B topical, 1 EA, TOP, ud, PRN    Cepacol Regular Strength lozenge, 1 EA, PO, ud, PRN    Chloraseptic 1.4% spray, 1 spray(s), PO, q2hr, PRN    heparin, 5000 Unit(s)= 1 mL, sc, q8hr    levothyroxine, 125 mcg= 1 tab(s), PO, Daily    metoclopramide, 10 mg= 2 mL, IV Push, q6hr    morPHINE, 3 mg= 0.75 mL, IV Push, q2hr, PRN    morPHINE, 2 mg= 1 mL, IV Push, q2hr, PRN    morPHINE, 1 mg= 0.5 mL, IV Push, q2hr, PRN    normal saline 1,000 mL, 1000 mL, IV    ondansetron, 4 mg= 2 mL, IV Push, q6hr, PRN    ondansetron, 4 mg= 2 mL, IV Push, q6hr, PRN    Protonix, 40 mg= 1 EA, IV Push, q24hr    Sodium Chloride 0.9% Flush, 10 mL, Flush, q12hr    Sodium Chloride 0.9% Flush, 10 mL, Flush, ud, PRN    Tylenol 325 mg oral tablet, 650 mg= 2 tab(s), PO, q4hr, PRN    Lab Results    Glucose Lvl: 92 mg/dL (19/14/78 29:56:21)    BUN: 15 mg/dL (30/86/57 84:69:62)    Creatinine: 0.363 mg/dL Low (95/28/41 32:44:01)    Afn Amer Glomerular Filtration Rate: >90 (03/14/20 06:37:00)    Non-Afn Amer Glomerular Filtration Rate: >90 (03/14/20  06:37:00)    Sodium Lvl: 139 mmol/L (03/14/20 06:37:00)    Potassium Lvl: 4 mmol/L (03/14/20 06:37:00)    Chloride: 108 mmol/L (03/14/20 06:37:00)    CO2: 26 mmol/L (03/14/20 06:37:00)    Anion Gap: 5 (03/14/20 06:37:00)    Total Protein: 4.6 Gm/dL Low (02/72/53 66:44:03)    Albumin Lvl: 2.1 Gm/dL Low (47/42/59 56:38:75)    Calcium Lvl: 7.7 mg/dL Low (64/33/29 51:88:41)    Phosphorus: 2.8 mg/dL (66/06/30 16:01:09)    Magnesium Lvl: 2.2 mg/dL (32/35/57 32:20:25)    Bilirubin Total: 0.3 mg/dL (42/70/62 37:62:83)    Alkaline Phosphatase: 113 Units/L (03/14/20 06:37:00)    AST: 12 Units/L (03/14/20 06:37:00)    ALT: 30 Units/L (03/14/20 06:37:00)    WBC: 14.7 thous/mm3 High (03/14/20 06:37:00)    RBC: 3.37 Mil/mm3 Low (03/14/20 06:37:00)  Hgb: 7.4 Gm/dL Low (19/14/78 29:56:21)    Hct: 24.3 % Low (03/14/20 06:37:00)    Platelet: 225 thous/mm3 (03/14/20 06:37:00)    MCV: 72.1 fL Low (03/14/20 06:37:00)    MCH: 22 pGm Low (03/14/20 06:37:00)    MCHC: 30.5 Gm/dL Low (30/86/57 84:69:62)    RDW-SD: 47.3 fL (03/14/20 06:37:00)    MPV: 11.1 fL (03/14/20 06:37:00)    Absolute Neutro Count: 11.7 thous/mm3 High (03/14/20 06:37:00)    Absolute Lymphs Count: 1.18 thous/mm3 (03/14/20 06:37:00)    Absolute Mono Count: 1.24 thous/mm3 (03/14/20 06:37:00)    Absolute Eos Count: 0.47 thous/mm3 High (03/14/20 06:37:00)    Absolute Baso Count: 0.03 thous/mm3 (03/14/20 06:37:00)    Neutrophils: 79.5 % (03/14/20 06:37:00)    Lymphocytes: 8 % (03/14/20 06:37:00)    Monocytes: 8.4 % (03/14/20 06:37:00)    Eosinophils: 3.2 % (03/14/20 06:37:00)    Basophils: 0.2 % (03/14/20 06:37:00)    Immature Granulocytes: 0.7 % (03/14/20 06:37:00)    NRBC Percent: 0 % (03/14/20 06:37:00)    Absolute NRBC Count: 0 thous/mm3 (03/14/20 06:37:00)    Diagnostic Results        ------        SIGNATURE LINE Electronically signed by Eusebio Friendly MD, Claire Shown on 03/14/2020 at  15:31:11 EST

## 2020-03-01 NOTE — Progress Notes (Signed)
 Name :  [name], [name]    DOB :  [id number]    Sex :  Female    MRN :  [id number]    Subjective    vomited last night    no N&V today    tol TF at 20    + flattus and Bm    Objective    Vitals & Measurements    **T: **98.4 ??F (Oral) **HR: **108(Peripheral) **RR: **18 **BP: **145/59  **SpO2: **96%    **HT: **160 cm **WT: **51 Kg **BMI: **19.92    aao    abd soft NT ND      Impression and Plan    Acute bowel obstruction    Status post partial gastrectomy and Billroth II on August 3, redo anastomosis  August 24    Patient continues to have gastroparesis, therefore we will advance her tube  feeding to goal, wean TPN    I suspect she will go home on tube feeding for 2 to 3 weeks          heparin, Dose: 5,000 Unit(s) = 1 mL, Injection, sc, q8hr, First Dose  Date/Time: 03/13/20 12:00:00 EDT    morPHINE, Dose: 3 mg = 0.75 mL, Injection, IV Push, q4hr, PRN, Pain, Severe,  First Dose Date/Time: 03/13/20 10:37:00 EDT    morPHINE, Dose: 2 mg = 1 mL, Injection, IV Push, q2hr, PRN, Pain, Moderate,  First Dose Date/Time: 03/12/20 19:44:00 EDT    ALT    Bilirubin Total    CBC w/ Diff    CBC w/ Indices    Enteral Feeds    Miscellaneous Nursing Task        Medications    _Inpatient_    Amino Acid / Dextrose TPN 2,000 mL + M.V.I.-12 10 mL + Trace Metals 1 mL    amitriptyline, 100 mg= 2 tab(s), PO, HS    aspirin, 81 mg= 1 tab(s), PO, Daily    bacitracin-polymyxin B topical, 1 EA, TOP, ud, PRN    Cepacol Regular Strength lozenge, 1 EA, PO, ud, PRN    Chloraseptic 1.4% spray, 1 spray(s), PO, q2hr, PRN    erythromycin 250mg  ec tablet, 250 mg= 6.25 mL, NG, q6hr    heparin, 5000 Unit(s)= 1 mL, sc, q8hr    levothyroxine, 125 mcg= 1 tab(s), PO, Daily    metoclopramide, 10 mg= 2 mL, IV Push, q6hr    morPHINE, 3 mg= 0.75 mL, IV Push, q4hr, PRN    morPHINE, 2 mg= 1 mL, IV Push, q2hr, PRN    morPHINE, 1 mg= 0.5 mL, IV Push, q2hr, PRN    ondansetron, 4 mg= 2 mL, IV Push, q6hr, PRN    ondansetron, 4 mg= 2 mL, IV Push, q6hr, PRN    Protonix,  40 mg= 1 EA, IV Push, q24hr    Sodium Chloride 0.9% Flush, 10 mL, Flush, q12hr    Sodium Chloride 0.9% Flush, 10 mL, Flush, ud, PRN    Tylenol 325 mg oral tablet, 650 mg= 2 tab(s), PO, q4hr, PRN    Lab Results    Blood Glucose POC: 132 mg/dL High (01/65/80 06:34:94)    Glucose Lvl: 118 mg/dL High (94/47/39 58:44:17)    BUN: 15 mg/dL (12/78/71 83:67:25)    Creatinine: 0.345 mg/dL Low (50/01/64 29:03:79)    Afn Amer Glomerular Filtration Rate: >90 (03/28/20 07:31:00)    Non-Afn Amer Glomerular Filtration Rate: >90 (03/28/20 07:31:00)    Sodium Lvl: 136 mmol/L (03/28/20 07:31:00)    Potassium Lvl: 4.1 mmol/L (  03/28/20 07:31:00)    Chloride: 104 mmol/L (03/28/20 07:31:00)    CO2: 27 mmol/L (03/28/20 07:31:00)    Anion Gap: 5 (03/28/20 07:31:00)    Total Protein: 5.4 Gm/dL Low (51/10/21 11:73:56)    Albumin Lvl: 2.4 Gm/dL Low (70/14/10 30:13:14)    Calcium Lvl: 8.1 mg/dL Low (38/88/75 79:72:82)    Phosphorus: 2.9 mg/dL (12/19/54 15:37:94)    Magnesium Lvl: 2.4 mg/dL (32/76/14 70:92:95)    Bilirubin Total: 0.2 mg/dL (74/73/40 37:09:64)    Alkaline Phosphatase: 154 Units/L High (03/28/20 07:31:00)    AST: 25 Units/L (03/28/20 07:31:00)    ALT: 39 Units/L (03/28/20 07:31:00)    Lav Top Tube to Hold: DONE (03/28/20 07:31:00)    Diagnostic Results        ------        [place] LINE Electronically signed by Donny Pique MD, Wassim on 03/28/2020 at  15:45:20 EST

## 2020-03-01 NOTE — Progress Notes (Signed)
 Name :  [name], [name]    DOB :  [id number]    Sex :  Female    MRN :  [id number]    Subjective    Doing well, tolerating diet, had a sandwich yesterday evening.    Objective    Vitals & Measurements    **T: **97.6 ??F (Oral) **HR: **106(Peripheral) **RR: **16 **BP: **135/57  **SpO2: **97%    **HT: **160 cm **WT: **47 Kg **BMI: **19.92    awake, alert, nad    abdomen soft, nt, nd, gtube in place c/d/i    Sacrum nontender to palpation, no bruising noted    Impression and Plan    abdominal pain (Complaint of)    Acute bowel obstruction    Lidocaine Patch Removal, Dose: 1 patch(es), Patch, Remove, q24hr, First Dose  Date/Time: 04/14/20 6:00:00 EDT    lidocaine topical, Dose: 1 patch(es), Patch, Transderm, Daily, Back, First  Dose Date/Time: 04/13/20 14:00:00 EDT    low residue diet today (though she already ate yesterday)    lidocaine patch on sacrum in area of pain    continue antibiotics for uti          Medications    _Inpatient_    Amino Acid / Dextrose TPN 2,000 mL + M.V.I.-12 10 mL + Trace Metals 1 mL    amitriptyline, 100 mg= 2 tab(s), PO, HS    aspirin, 81 mg= 1 tab(s), PO, Daily    bacitracin-polymyxin B topical, 1 EA, TOP, ud, PRN    Bactrim DS 800 mg-160 mg oral tablet, 1 tab(s), PO, BID    Ben Gay, 1 app, TOP, TID, PRN    Carafate, 1 Gm= 1 tab(s), PO, QID    Cepacol Regular Strength lozenge, 1 EA, PO, ud, PRN    Chloraseptic 1.4% spray, 1 spray(s), PO, q2hr, PRN    gabapentin, 300 mg= 1 cap(s), PO, q8hr    heparin, 5000 Unit(s)= 1 mL, sc, q8hr    levothyroxine, 125 mcg= 1 tab(s), PO, q24hr    Lidocaine 4% patch, 1 patch(es), Transderm, Daily    Lidocaine Patch Removal, 1 patch(es), Remove, q24hr    Milk of Magnesia 8% oral suspension, 2400 mg= 30 mL, PO, q6hr, PRN    morPHINE, 3 mg= 0.75 mL, IV Push, q4hr, PRN    morPHINE, 2 mg= 1 mL, IV Push, q2hr, PRN    Protonix, 40 mg= 1 EA, IV Push, BID    Reglan, 10 mg= 2 mL, IV Push, q6hr    Sodium Chloride 0.9% Flush, 10 mL, Flush, q12hr    Sodium Chloride 0.9%  Flush, 10 mL, Flush, ud, PRN    Tylenol 325 mg oral tablet, 650 mg= 2 tab(s), PO, q4hr, PRN    Zofran, 4 mg= 2 mL, IV Push, q8hr, PRN    Lab Results    Blood Glucose POC: 124 mg/dL High (38/46/65 99:35:70)    Glucose Lvl: 89 mg/dL (17/79/39 03:00:92)    BUN: 20 mg/dL (33/00/76 22:63:33)    Creatinine: 0.41 mg/dL Low (54/56/25 63:89:37)    Afn Amer Glomerular Filtration Rate: >90 (04/13/20 10:33:00)    Non-Afn Amer Glomerular Filtration Rate: >90 (04/13/20 10:33:00)    Sodium Lvl: 135 mmol/L Low (04/13/20 10:33:00)    Potassium Lvl: 3.9 mmol/L (04/13/20 10:33:00)    Chloride: 103 mmol/L (04/13/20 10:33:00)    CO2: 25 mmol/L (04/13/20 10:33:00)    Anion Gap: 7 (04/13/20 10:33:00)    Calcium Lvl: 8.7 mg/dL (34/28/76 81:15:72)    Phosphorus: 3.4 mg/dL (  04/13/20 10:33:00)    Magnesium Lvl: 1.9 mg/dL (29/56/21 30:86:57)    WBC: 11.7 thous/mm3 High (04/13/20 10:33:00)    RBC: 4.09 Mil/mm3 (04/13/20 10:33:00)    Hgb: 9.1 Gm/dL Low (84/69/62 95:28:41)    Hct: 30.3 % Low (04/13/20 10:33:00)    Platelet: 374 thous/mm3 (04/13/20 10:33:00)    MCV: 74.1 fL Low (04/13/20 10:33:00)    MCH: 22.2 pGm Low (04/13/20 10:33:00)    MCHC: 30 Gm/dL Low (32/44/01 02:72:53)    RDW-SD: 57.1 fL High (04/13/20 10:33:00)    MPV: 10.4 fL (04/13/20 10:33:00)    Absolute Neutro Count: 8.37 thous/mm3 High (04/13/20 10:33:00)    Absolute Lymphs Count: 1.51 thous/mm3 (04/13/20 10:33:00)    Absolute Mono Count: 1.18 thous/mm3 (04/13/20 10:33:00)    Absolute Eos Count: 0.49 thous/mm3 High (04/13/20 10:33:00)    Absolute Baso Count: 0.03 thous/mm3 (04/13/20 10:33:00)    Neutrophils: 71.6 % (04/13/20 10:33:00)    Lymphocytes: 12.9 % (04/13/20 10:33:00)    Monocytes: 10.1 % (04/13/20 10:33:00)    Eosinophils: 4.2 % (04/13/20 10:33:00)    Basophils: 0.3 % (04/13/20 10:33:00)    Immature Granulocytes: 0.9 % (04/13/20 10:33:00)    NRBC Percent: 0 % (04/13/20 10:33:00)    Absolute NRBC Count: 0 thous/mm3 (04/13/20 10:33:00)    Hypochromia: 1+ Abnormal (04/13/20  10:33:00)    Polychrom: 1+ Abnormal (04/13/20 10:33:00)    Anisocytosis: 1+ Abnormal (04/13/20 10:33:00)    Microcytes: 1+ Abnormal (04/13/20 10:33:00)    Platelet Morph: Enlarged Abnormal (04/13/20 10:33:00)    Diagnostic Results        ------        SIGNATURE LINE Electronically signed by Eusebio Friendly MD, Claire Shown on 04/13/2020 at  16:40:46 EST

## 2020-03-01 NOTE — Progress Notes (Signed)
 Name :  CHACE, BISCH    DOB :  [id number]    Sex :  Female    MRN :  [id number]    Subjective    vomited last night    feels ok now, + BM    no abd pain    Objective    Vitals & Measurements    **T: **98.0 ??F (Oral) **HR: **107(Peripheral) **RR: **18 **BP: **134/64  **SpO2: **99%    **HT: **160 cm **WT: **51 Kg **BMI: **19.92    aao    abd soft NT ND    Impression and Plan    Acute bowel obstruction    s/p partila gastrect/ B II, redo anastomosis    ileus on and off    adjust meds    repeat labs    ? CT in am          Medications    _Inpatient_    amitriptyline, 100 mg= 2 tab(s), PO, HS    aspirin, 81 mg= 1 tab(s), PO, Daily    bacitracin-polymyxin B topical, 1 EA, TOP, ud, PRN    [employer], 1 app, TOP, TID, PRN    Cepacol Regular Strength lozenge, 1 EA, PO, ud, PRN    Chloraseptic 1.4% spray, 1 spray(s), PO, q2hr, PRN    free water bolus, 150 mL, FTUBE, QID    gabapentin, 300 mg= 1 cap(s), PO, HS    heparin, 5000 Unit(s)= 1 mL, sc, q8hr    levothyroxine, 125 mcg= 1 tab(s), PO, q24hr    LORazepam, 0.5 mg= 1 tab(s), PO, q6hr, PRN    Milk of Magnesia 8% oral suspension, 2400 mg= 30 mL, PO, q6hr, PRN    morPHINE, 3 mg= 0.75 mL, IV Push, q4hr, PRN    morPHINE, 2 mg= 1 mL, IV Push, q2hr, PRN    morPHINE, 1 mg= 0.5 mL, IV Push, q2hr, PRN    Protonix, 40 mg= 1 EA, IV Push, BID    Sodium Chloride 0.9% Flush, 10 mL, Flush, q12hr    Sodium Chloride 0.9% Flush, 10 mL, Flush, ud, PRN    Tylenol 325 mg oral tablet, 650 mg= 2 tab(s), PO, q4hr, PRN    Zofran, 4 mg= 1 tab(s), PO, q6hr, PRN    Lab Results    No labs resulted in the past 24 hours.    Diagnostic Results        ------        SIGNATURE LINE Electronically signed by Donny Pique MD, [place] on 04/02/2020 at  15:55:09 EST

## 2020-03-01 NOTE — Progress Notes (Signed)
 Name :  [name], [name]    DOB :  [id number]    Sex :  Female    MRN :  [id number]    Subjective    85 yo female s/p B2 and revision for GOO with failure of gastric emptying  post-op. Pt reports that she has been tolerating a full liquid diet without  any nausea or emesis. She also reports that her nausea is less with the tube  feeds off. She reports that she strongly desires to be discharged.    Objective    Vitals & Measurements    **T: **97.7 ??F (Oral) **HR: **105(Peripheral) **RR: **18 **BP: **131/56  **SpO2: **99%    **HT: **160 cm **WT: **51 Kg **BMI: **19.92    nad    abd soft, NT/ND    g tube in place    Impression and Plan    abdominal pain (Complaint of)    85 yo female s/p b2 and revision for GOO now tolerating a full liquid diet  well without nausea or emesis. Pt to remain on full liquid diet until Monday  when Dr. Donny Pique returns.    Acute bowel obstruction        Medications    _Inpatient_    amitriptyline, 100 mg= 2 tab(s), PO, HS    aspirin, 81 mg= 1 tab(s), PO, Daily    bacitracin-polymyxin B topical, 1 EA, TOP, ud, PRN    Ben Gay, 1 app, TOP, TID, PRN    Carafate, 1 Gm= 1 tab(s), PO, QID    Cepacol Regular Strength lozenge, 1 EA, PO, ud, PRN    Chloraseptic 1.4% spray, 1 spray(s), PO, q2hr, PRN    D5 in 0.45% NS w/KCL 47mEq/L 1,000 mL, 1000 mL, IV    free water bolus, 150 mL, FTUBE, QID    gabapentin, 300 mg= 1 cap(s), PO, q8hr    heparin, 5000 Unit(s)= 1 mL, sc, q8hr    Lactated Ringers 1,000 mL, 1000 mL, IV    levothyroxine, 125 mcg= 1 tab(s), PO, q24hr    Milk of Magnesia 8% oral suspension, 2400 mg= 30 mL, PO, q6hr, PRN    morPHINE, 3 mg= 0.75 mL, IV Push, q4hr, PRN    morPHINE, 2 mg= 1 mL, IV Push, q2hr, PRN    Protonix, 40 mg= 1 EA, IV Push, BID    Sodium Chloride 0.9% Flush, 10 mL, Flush, q12hr    Sodium Chloride 0.9% Flush, 10 mL, Flush, ud, PRN    Tylenol 325 mg oral tablet, 650 mg= 2 tab(s), PO, q4hr, PRN    Zofran, 4 mg= 1 tab(s), PO, q6hr, PRN    Lab Results    Glucose Lvl: 95  mg/dL (24/26/83 41:96:22)    BUN: 14 mg/dL (29/79/89 21:19:41)    Creatinine: 0.41 mg/dL Low (74/08/14 48:18:56)    Afn Amer Glomerular Filtration Rate: >90 (04/06/20 05:56:00)    Non-Afn Amer Glomerular Filtration Rate: >90 (04/06/20 05:56:00)    Sodium Lvl: 137 mmol/L (04/06/20 05:56:00)    Potassium Lvl: 4.5 mmol/L (04/06/20 05:56:00)    Chloride: 105 mmol/L (04/06/20 05:56:00)    CO2: 27 mmol/L (04/06/20 05:56:00)    Anion Gap: 5 (04/06/20 05:56:00)    Calcium Lvl: 8.1 mg/dL Low (31/49/70 26:37:85)    Magnesium Lvl: 2.2 mg/dL (88/50/27 74:12:87)    WBC: 10.3 thous/mm3 (04/06/20 05:56:00)    RBC: 3.31 Mil/mm3 Low (04/06/20 05:56:00)    Hgb: 8.1 Gm/dL Low (86/76/72 09:47:09)    Hct: 27.9 % Low (04/06/20  07:58:00)    Platelet: 259 thous/mm3 (04/06/20 05:56:00)    MCV: 71 fL Low (04/06/20 05:56:00)    MCH: 20.8 pGm Low (04/06/20 05:56:00)    MCHC: 29.4 Gm/dL Low (19/75/88 32:54:98)    RDW-SD: 50.1 fL (04/06/20 05:56:00)    MPV: 11.1 fL (04/06/20 05:56:00)    Absolute Neutro Count: 6.73 thous/mm3 (04/06/20 05:56:00)    Absolute Lymphs Count: 2.29 thous/mm3 (04/06/20 05:56:00)    Absolute Mono Count: 0.82 thous/mm3 (04/06/20 05:56:00)    Absolute Eos Count: 0.4 thous/mm3 (04/06/20 05:56:00)    Absolute Baso Count: 0.03 thous/mm3 (04/06/20 05:56:00)    Neutrophils: 65.3 % (04/06/20 05:56:00)    Lymphocytes: 22.2 % (04/06/20 05:56:00)    Monocytes: 8 % (04/06/20 05:56:00)    Eosinophils: 3.9 % (04/06/20 05:56:00)    Basophils: 0.3 % (04/06/20 05:56:00)    Immature Granulocytes: 0.3 % (04/06/20 05:56:00)    NRBC Percent: 0 % (04/06/20 05:56:00)    Absolute NRBC Count: 0 thous/mm3 (04/06/20 05:56:00)    Hypochromia: 1+ Abnormal (04/06/20 05:56:00)    Polychrom: 1+ Abnormal (04/06/20 05:56:00)    Anisocytosis: 1+ Abnormal (04/06/20 05:56:00)    Microcytes: 1+ Abnormal (04/06/20 05:56:00)    Elliptocytes: 1+ Abnormal (04/06/20 05:56:00)    Teardrops: 1+ Abnormal (04/06/20 05:56:00)    Platelet Morph: Enlarged Abnormal  (04/06/20 05:56:00)    Diagnostic Results        ------        SIGNATURE LINE Electronically signed by Randolph Bing MD, Oda Kilts on 04/06/2020 at  23:22:53 EST

## 2020-03-01 NOTE — Progress Notes (Signed)
Name :  [name], [name]    DOB :  [id number]    Sex :  Female    MRN :  [id number]    Subjective    POD#5    Feels better today - passed flatus and had liquid BM.    NGT output/residual low - d/c'd.    Developed a little distension and pain after tube feeds increased - will hold  for four hours and restart at a lower rate.    Abdomen less tender today. Solver dressing in place, and dry.    [facility] back tomorrow.    Objective    Vitals & Measurements    **T: **99.2 F (Oral) **HR: **118(Peripheral) **RR: **18 **BP: **132/56  **SpO2: **98%    **HT: **160 cm **WT: **51 Kg **BMI: **19.92    Impression and Plan    abdominal pain (Complaint of)    Acute bowel obstruction        Medications    _Inpatient_    amitriptyline, 100 mg= 2 tab(s), PO, HS    bacitracin-polymyxin B topical, 1 EA, TOP, ud, PRN    Cepacol Regular Strength lozenge, 1 EA, PO, ud, PRN    Chloraseptic 1.4% spray, 1 spray(s), PO, q2hr, PRN    D5 in 0.45% NS w/KCL 58mEq/L 1,000 mL, 1000 mL, IV    heparin, 5000 Unit(s)= 1 mL, sc, q8hr    levothyroxine, 125 mcg= 1 tab(s), PO, Daily    metoclopramide, 10 mg= 2 mL, IV Push, q6hr    morPHINE, 3 mg= 0.75 mL, IV Push, q2hr, PRN    morPHINE, 2 mg= 1 mL, IV Push, q2hr, PRN    morPHINE, 1 mg= 0.5 mL, IV Push, q2hr, PRN    ondansetron, 4 mg= 2 mL, IV Push, q6hr, PRN    ondansetron, 4 mg= 2 mL, IV Push, q6hr, PRN    Protonix, 40 mg= 1 EA, IV Push, q24hr    Sodium Chloride 0.9% Flush, 10 mL, Flush, q12hr    Sodium Chloride 0.9% Flush, 10 mL, Flush, ud, PRN    Tylenol 325 mg oral tablet, 650 mg= 2 tab(s), PO, q4hr, PRN    Lab Results    Blood Glucose POC: 194 mg/dL High (62/95/28 41:32:44)    Glucose Lvl: 111 mg/dL High (07/21/70 53:66:44)    BUN: 9 mg/dL (03/47/42 59:56:38)    Creatinine: 0.404 mg/dL Low (75/64/33 29:51:88)    Afn [employer] Glomerular Filtration Rate: >90 (03/17/20 05:33:00)    Non-Afn Amer Glomerular Filtration Rate: >90 (03/17/20 05:33:00)    Sodium Lvl: 137 mmol/L (03/17/20 05:33:00)     Potassium Lvl: 4.6 mmol/L (03/17/20 05:33:00)    Chloride: 106 mmol/L (03/17/20 05:33:00)    CO2: 29 mmol/L (03/17/20 05:33:00)    Anion Gap: 2 Low (03/17/20 05:33:00)    Total Protein: 5 Gm/dL Low (41/66/06 30:16:01)    Albumin Lvl: 2.1 Gm/dL Low (09/32/35 57:32:20)    Calcium Lvl: 7.8 mg/dL Low (25/42/70 62:37:62)    Phosphorus: 3.5 mg/dL (83/15/17 61:60:73)    Magnesium Lvl: 2 mg/dL (71/06/26 94:85:46)    Bilirubin Total: 0.2 mg/dL (27/03/50 09:38:18)    Alkaline Phosphatase: 111 Units/L (03/17/20 05:33:00)    AST: 8 Units/L (03/17/20 05:33:00)    ALT: 16 Units/L (03/17/20 05:33:00)    WBC: 9.4 thous/mm3 (03/17/20 05:33:00)    RBC: 3.39 Mil/mm3 Low (03/17/20 05:33:00)    Hgb: 7.2 Gm/dL Low (29/93/71 69:67:89)    Hct: 24.7 % Low (03/17/20 05:33:00)    Platelet: 259 thous/mm3 (03/17/20  05:33:00)    MCV: 72.9 fL Low (03/17/20 05:33:00)    MCH: 21.2 pGm Low (03/17/20 05:33:00)    MCHC: 29.1 Gm/dL Low (59/56/38 75:64:33)    RDW-SD: 48.3 fL (03/17/20 05:33:00)    MPV: 11.8 fL (03/17/20 05:33:00)    Absolute Neutro Count: 7.37 thous/mm3 (03/17/20 05:33:00)    Absolute Lymphs Count: 0.9 thous/mm3 (03/17/20 05:33:00)    Absolute Mono Count: 0.64 thous/mm3 (03/17/20 05:33:00)    Absolute Eos Count: 0.35 thous/mm3 (03/17/20 05:33:00)    Absolute Baso Count: 0.02 thous/mm3 (03/17/20 05:33:00)    Neutrophils: 78.7 % (03/17/20 05:33:00)    Lymphocytes: 9.6 % (03/17/20 05:33:00)    Monocytes: 6.8 % (03/17/20 05:33:00)    Eosinophils: 3.7 % (03/17/20 05:33:00)    Basophils: 0.2 % (03/17/20 05:33:00)    Immature Granulocytes: 1 % (03/17/20 05:33:00)    NRBC Percent: 0 % (03/17/20 05:33:00)    Absolute NRBC Count: 0 thous/mm3 (03/17/20 05:33:00)    Diagnostic Results        ------        SIGNATURE LINE Electronically signed by Colin Benton MD, Kynedi Profitt on 03/17/2020 at  21:39:12 EST

## 2020-03-01 NOTE — Progress Notes (Signed)
 finished 5 day course of cipro for UTI, will DC    SIGNATURE LINE Electronically signed by Donny Pique MD, [place] on 03/26/2020 at  17:07:27 EST

## 2020-03-01 NOTE — Progress Notes (Signed)
 Name :  [name], [name]    DOB :  [id number]    Sex :  Female    MRN :  [id number]    please see dc summary    SIGNATURE LINE Electronically signed by Donny Pique MD, [place] on 04/20/2020 at  11:01:50 EST

## 2020-03-01 NOTE — Progress Notes (Signed)
 CT of the abdomen negative for intra-abdominal pathology except for small  bowel obstruction. CT chest showing Left lower lobe opacification concerning  for progressive airspace disease. Patient had a fever of 101 ??F. Blood  cultures were ordered. Patient started on vancomycin and Zosyn. Serum sodium  came back as 125. She received free water which I will discontinue.    SIGNATURE LINE Electronically signed by Ninfa Linden MD, Chace Bisch on 04/08/2020 at  06:21:56 EST

## 2020-03-01 NOTE — Progress Notes (Signed)
 Name :  [name], [name]    DOB :  [id number]    Sex :  Female    MRN :  [id number]    Subjective    feels better today    + flattus    min abd pain    Objective    Vitals & Measurements    **T: **97.3 ??F (Oral) **HR: **102(Peripheral) **RR: **20 **BP: **137/61  **SpO2: **91%    **HT: **160 cm **WT: **51 Kg **BMI: **19.92    aao    abd soft NT    mild distention    incis CDI        J tube ballon emptied        CT images reviewed, diffuse ileus    Impression and Plan    abdominal pain (Complaint of)    s/p partial gastrect 8/3, redo anastomosis 8/24    ileus    continue NPO/TPN          heparin, Dose: 5,000 Unit(s) = 1 mL, Injection, sc, q8hr, First Dose  Date/Time: 03/13/20 12:00:00 EDT    Consult to Nutrition    Nasogastric/Orogastric Tube Insertion    Nasogastric/Orogastric Tube Insertion    PT Evaluation and Treatment        Medications    _Inpatient_    amitriptyline, 100 mg= 2 tab(s), PO, HS    bacitracin-polymyxin B topical, 1 EA, TOP, ud, PRN    Cepacol Regular Strength lozenge, 1 EA, PO, ud, PRN    Chloraseptic 1.4% spray, 1 spray(s), PO, q2hr, PRN    heparin, 5000 Unit(s)= 1 mL, sc, q8hr    levothyroxine, 125 mcg= 1 tab(s), PO, Daily    metoclopramide, 10 mg= 2 mL, IV Push, q6hr    morPHINE, 3 mg= 0.75 mL, IV Push, q2hr, PRN    morPHINE, 2 mg= 1 mL, IV Push, q2hr, PRN    morPHINE, 1 mg= 0.5 mL, IV Push, q2hr, PRN    ondansetron, 4 mg= 2 mL, IV Push, q6hr, PRN    ondansetron, 4 mg= 2 mL, IV Push, q6hr, PRN    Protonix, 40 mg= 1 EA, IV Push, q24hr    Sodium Chloride 0.9% Flush, 10 mL, Flush, q12hr    Sodium Chloride 0.9% Flush, 10 mL, Flush, ud, PRN    Tylenol 325 mg oral tablet, 650 mg= 2 tab(s), PO, q4hr, PRN    Lab Results    Blood Glucose POC: 111 mg/dL High (60/63/01 60:10:93)    Glucose Lvl: 100 mg/dL (23/55/73 22:02:54)    BUN: 17 mg/dL (27/06/23 76:28:31)    Creatinine: 0.365 mg/dL Low (51/76/16 07:37:10)    Afn Amer Glomerular Filtration Rate: >90 (03/19/20 05:46:00)    Non-Afn Amer Glomerular  Filtration Rate: >90 (03/19/20 05:46:00)    Sodium Lvl: 139 mmol/L (03/19/20 05:46:00)    Potassium Lvl: 4.4 mmol/L (03/19/20 05:46:00)    Chloride: 109 mmol/L (03/19/20 05:46:00)    CO2: 25 mmol/L (03/19/20 05:46:00)    Anion Gap: 5 (03/19/20 05:46:00)    Total Protein: 4.6 Gm/dL Low (62/69/48 54:62:70)    Albumin Lvl: 1.8 Gm/dL Low (35/00/93 81:82:99)    Calcium Lvl: 7.5 mg/dL Low (37/16/96 78:93:81)    Phosphorus: 3 mg/dL (01/75/10 25:85:27)    Magnesium Lvl: 2.1 mg/dL (78/24/23 53:61:44)    Bilirubin Total: 0.2 mg/dL (31/54/00 86:76:19)    Alkaline Phosphatase: 99 Units/L (03/19/20 05:46:00)    AST: 8 Units/L (03/19/20 05:46:00)    ALT: 15 Units/L (03/19/20 05:46:00)    WBC: 11.8  thous/mm3 High (03/19/20 05:46:00)    RBC: 3.45 Mil/mm3 Low (03/19/20 05:46:00)    Hgb: 7.3 Gm/dL Low (83/07/46 00:29:84)    Hct: 24.7 % Low (03/19/20 05:46:00)    Platelet: 248 thous/mm3 (03/19/20 05:46:00)    MCV: 71.6 fL Low (03/19/20 05:46:00)    MCH: 21.2 pGm Low (03/19/20 05:46:00)    MCHC: 29.6 Gm/dL Low (73/08/56 94:37:00)    RDW-SD: 47.9 fL (03/19/20 05:46:00)    MPV: Unable to Perform (03/19/20 05:46:00)    Absolute Neutro Count: 8.96 thous/mm3 High (03/19/20 05:46:00)    Absolute Lymphs Count: 1.34 thous/mm3 (03/19/20 05:46:00)    Absolute Mono Count: 1.08 thous/mm3 (03/19/20 05:46:00)    Absolute Eos Count: 0.36 thous/mm3 (03/19/20 05:46:00)    Absolute Baso Count: 0.02 thous/mm3 (03/19/20 05:46:00)    Neutrophils: 75.6 % (03/19/20 05:46:00)    Lymphocytes: 11.3 % (03/19/20 05:46:00)    Monocytes: 9.1 % (03/19/20 05:46:00)    Eosinophils: 3 % (03/19/20 05:46:00)    Basophils: 0.2 % (03/19/20 05:46:00)    Immature Granulocytes: 0.8 % (03/19/20 05:46:00)    NRBC Percent: 0 % (03/19/20 05:46:00)    Absolute NRBC Count: 0 thous/mm3 (03/19/20 05:46:00)    Diagnostic Results        ------        SIGNATURE LINE Electronically signed by Donny Pique MD, [place] on 03/19/2020 at  09:03:21 EST

## 2020-03-01 NOTE — Progress Notes (Signed)
 Name :  [name], [name]    DOB :  [id number]    Sex :  Female    MRN :  [id number]    Subjective    doing well    no pain at all    no N&V    tol full liquids    + Bm    Objective    Vitals & Measurements    **T: **98 ??F (Oral) **HR: **100(Peripheral) **RR: **18 **BP: **135/64 **SpO2:  **100%    **HT: **160 cm **WT: **47 Kg **BMI: **19.92    aao    abd soft NT ND    Impression and Plan    Acute bowel obstruction    continue full one more day    advance diet in am          Amino Acid / Dextrose TPN 2,000 mL + multivitamin 10 mL + trace elements 1 mL,  Dose: 2,000 mL, Soln, IV, For: 24 hr, 83.79 ml/hr, 24 hr, 2,011 ml (TOTAL  VOLUME), First dose date/time: 04/12/20 21:00:00 EDT    heparin, Dose: 5,000 Unit(s) = 1 mL, Injection, sc, q8hr, First Dose  Date/Time: 03/13/20 12:00:00 EDT    morPHINE, Dose: 3 mg = 0.75 mL, Injection, IV Push, q4hr, PRN, Pain, Severe,  First Dose Date/Time: 03/13/20 10:37:00 EDT    morPHINE, Dose: 2 mg = 1 mL, Injection, IV Push, q2hr, PRN, Pain, Moderate,  First Dose Date/Time: 03/12/20 19:44:00 EDT        Medications    _Inpatient_    Amino Acid / Dextrose TPN 2,000 mL + M.V.I.-12 10 mL + Trace Metals 1 mL    Amino Acid / Dextrose TPN 2,000 mL + M.V.I.-12 10 mL + Trace Metals 1 mL    amitriptyline, 100 mg= 2 tab(s), PO, HS    aspirin, 81 mg= 1 tab(s), PO, Daily    bacitracin-polymyxin B topical, 1 EA, TOP, ud, PRN    Bactrim DS 800 mg-160 mg oral tablet, 1 tab(s), PO, BID    Ben Gay, 1 app, TOP, TID, PRN    Carafate, 1 Gm= 1 tab(s), PO, QID    Cepacol Regular Strength lozenge, 1 EA, PO, ud, PRN    Chloraseptic 1.4% spray, 1 spray(s), PO, q2hr, PRN    gabapentin, 300 mg= 1 cap(s), PO, q8hr    heparin, 5000 Unit(s)= 1 mL, sc, q8hr    levothyroxine, 125 mcg= 1 tab(s), PO, q24hr    Milk of Magnesia 8% oral suspension, 2400 mg= 30 mL, PO, q6hr, PRN    morPHINE, 3 mg= 0.75 mL, IV Push, q4hr, PRN    morPHINE, 2 mg= 1 mL, IV Push, q2hr, PRN    Protonix, 40 mg= 1 EA, IV Push, BID    Reglan, 10  mg= 2 mL, IV Push, q6hr    Sodium Chloride 0.9% Flush, 10 mL, Flush, q12hr    Sodium Chloride 0.9% Flush, 10 mL, Flush, ud, PRN    Tylenol 325 mg oral tablet, 650 mg= 2 tab(s), PO, q4hr, PRN    Zofran, 4 mg= 2 mL, IV Push, q8hr, PRN    Lab Results    Blood Glucose POC: 122 mg/dL High (84/16/60 63:01:60)    Glucose Lvl: 101 mg/dL (10/93/23 55:73:22)    BUN: 15 mg/dL (02/54/27 06:23:76)    Creatinine: 0.367 mg/dL Low (28/31/51 76:16:07)    Afn Amer Glomerular Filtration Rate: >90 (04/12/20 06:37:00)    Non-Afn Amer Glomerular Filtration Rate: >90 (04/12/20 06:37:00)    Sodium  Lvl: 139 mmol/L (04/12/20 06:37:00)    Potassium Lvl: 4.4 mmol/L (04/12/20 06:37:00)    Chloride: 106 mmol/L (04/12/20 06:37:00)    CO2: 25 mmol/L (04/12/20 06:37:00)    Anion Gap: 8 (04/12/20 06:37:00)    Calcium Lvl: 8.3 mg/dL Low (68/54/88 30:14:15)    Magnesium Lvl: 1.9 mg/dL (97/33/12 50:87:19)    Lav Top Tube to Hold: DONE (04/12/20 06:37:00)    Diagnostic Results        ------        [place] LINE Electronically signed by Donny Pique MD, [place] on 04/12/2020 at  17:52:13 EST

## 2020-03-01 NOTE — Progress Notes (Signed)
 Name :  [name], [name]    DOB :  [id number]    Sex :  Female    MRN :  [id number]    Subjective    feels better    min abd pain    + flattus and BM    Objective    Vitals & Measurements    **T: **97.8 ??F (Oral) **HR: **98(Peripheral) **RR: **18 **BP: **129/51 **SpO2:  **94%    **HT: **160 cm **WT: **51 Kg **BMI: **19.92    aao    abd soft    still mild distention    Impression and Plan    Acute bowel obstruction    s/p partial gastrect 8/3, redo anastomosis and GT 8/24    now w ileus    add erythromucin    TPN        Mg citrate to clear the contrast, UGI/SBFT afterwards      Amino Acid / Dextrose TPN 2,000 mL + multivitamin 10 mL + trace elements 1 mL,  Dose: 2,000 mL, Soln, IV, For: 24 hr, 83.79 ml/hr, 24 hr, 2,011 ml (TOTAL  VOLUME), First dose date/time: 03/20/20 21:00:00 EDT    fat emulsion, intravenous 250 mL, Dose: 250 mL, Emulsion, IV, For: 12 hr,  20.83 ml/hr, 250 ml (TOTAL VOLUME), First Dose Date/Time: 03/20/20 21:00:00  EDT    NPO        Medications    _Inpatient_    Amino Acid / Dextrose TPN 1,000 mL + M.V.I.-12 10 mL + Trace Metals 1 mL    Amino Acid / Dextrose TPN 2,000 mL + M.V.I.-12 10 mL + Trace Metals 1 mL    amitriptyline, 100 mg= 2 tab(s), PO, HS    bacitracin-polymyxin B topical, 1 EA, TOP, ud, PRN    Cepacol Regular Strength lozenge, 1 EA, PO, ud, PRN    Chloraseptic 1.4% spray, 1 spray(s), PO, q2hr, PRN    heparin, 5000 Unit(s)= 1 mL, sc, q8hr    levothyroxine, 125 mcg= 1 tab(s), PO, Daily    Lipids 20% intravenous 250 mL, 250 mL, IV    metoclopramide, 10 mg= 2 mL, IV Push, q6hr    morPHINE, 3 mg= 0.75 mL, IV Push, q2hr, PRN    morPHINE, 2 mg= 1 mL, IV Push, q2hr, PRN    morPHINE, 1 mg= 0.5 mL, IV Push, q2hr, PRN    ondansetron, 4 mg= 2 mL, IV Push, q6hr, PRN    ondansetron, 4 mg= 2 mL, IV Push, q6hr, PRN    Protonix, 40 mg= 1 EA, IV Push, q24hr    Sodium Chloride 0.9% Flush, 10 mL, Flush, q12hr    Sodium Chloride 0.9% Flush, 10 mL, Flush, ud, PRN    Tylenol 325 mg oral tablet, 650 mg= 2  tab(s), PO, q4hr, PRN    Lab Results    Blood Glucose POC: 135 mg/dL High (85/88/50 27:74:12)    Glucose Lvl: 101 mg/dL (87/86/76 72:09:47)    BUN: 18 mg/dL (09/62/83 66:29:47)    Creatinine: 0.313 mg/dL Low (65/46/50 35:46:56)    Afn Amer Glomerular Filtration Rate: >90 (03/20/20 06:09:00)    Non-Afn Amer Glomerular Filtration Rate: >90 (03/20/20 06:09:00)    Sodium Lvl: 139 mmol/L (03/20/20 06:09:00)    Potassium Lvl: 4 mmol/L (03/20/20 06:09:00)    Chloride: 107 mmol/L (03/20/20 06:09:00)    CO2: 28 mmol/L (03/20/20 06:09:00)    Anion Gap: 4 (03/20/20 06:09:00)    Total Protein: 4.8 Gm/dL Low (81/27/51 70:01:74)    Albumin  Lvl: 2 Gm/dL Low (72/25/75 05:18:33)    Calcium Lvl: 7.5 mg/dL Low (58/25/18 98:42:10)    Phosphorus: 2.8 mg/dL (31/28/11 88:67:73)    Magnesium Lvl: 2.3 mg/dL (73/66/81 59:47:07)    Bilirubin Total: 0.2 mg/dL (61/51/83 43:73:57)    Alkaline Phosphatase: 100 Units/L (03/20/20 06:09:00)    AST: 9 Units/L (03/20/20 06:09:00)    ALT: 13 Units/L (03/20/20 06:09:00)    WBC: 9.8 thous/mm3 (03/20/20 06:09:00)    RBC: 3.22 Mil/mm3 Low (03/20/20 06:09:00)    Hgb: 7 Gm/dL Low (89/78/47 84:12:82)    Hct: 22.9 % Low (03/20/20 06:09:00)    Platelet: 304 thous/mm3 (03/20/20 06:09:00)    MCV: 71.1 fL Low (03/20/20 06:09:00)    MCH: 21.7 pGm Low (03/20/20 06:09:00)    MCHC: 30.6 Gm/dL Low (03/02/87 71:95:97)    RDW-SD: 47.2 fL (03/20/20 06:09:00)    MPV: 11.3 fL (03/20/20 06:09:00)    Absolute Neutro Count: 6.74 thous/mm3 (03/20/20 06:09:00)    Absolute Lymphs Count: 1.32 thous/mm3 (03/20/20 06:09:00)    Absolute Mono Count: 1.05 thous/mm3 (03/20/20 06:09:00)    Absolute Eos Count: 0.36 thous/mm3 (03/20/20 06:09:00)    Absolute Baso Count: 0.02 thous/mm3 (03/20/20 06:09:00)    Neutrophils: 69 % (03/20/20 06:09:00)    Lymphocytes: 13.5 % (03/20/20 06:09:00)    Monocytes: 10.8 % (03/20/20 06:09:00)    Eosinophils: 3.7 % (03/20/20 06:09:00)    Basophils: 0.2 % (03/20/20 06:09:00)    Immature Granulocytes: 2.8 %  High (03/20/20 06:09:00)    NRBC Percent: 0 % (03/20/20 06:09:00)    Absolute NRBC Count: 0 thous/mm3 (03/20/20 06:09:00)    Diagnostic Results        ------        SIGNATURE LINE Electronically signed by Donny Pique MD, [place] on 03/20/2020 at  16:14:15 EST

## 2020-03-01 NOTE — H&P (Signed)
 ____________________________________________________________    HISTORY AND PHYSICAL  ADMITTED:  03/01/2020    HISTORY OF PRESENT ILLNESS:  The patient is well known to me.  She recently was   diagnosed with impending gastric outlet obstruction by an upper endoscopy.  She   underwent a laparotomy and antrectomy with gastrojejunostomy on 02/20/2020.  She  did well and was discharged home on 08/11 after she has tolerated a stage II   post-gastrectomy diet for more than 24 hours.    Overnight, she has been throwing up, unable to keep anything down.  She called   our office and  I advised her to come to the Emergency Room immediately and she   did.    Currently, she has minimal abdominal pain.  No nausea or vomiting since she has   arrived here.    PAST MEDICAL HISTORY:  As above.  1.  Hypothyroidism.  2.  Reflux.  3.  Depression.    PAST SURGICAL HISTORY:  Laparotomy with a Cheree Ditto patch repair of perforated   duodenal ulcer, 01/2019.  At that time, incidental appendectomy was performed.    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    SOCIAL HISTORY:  Nonsmoker and nondrinker.    MEDICATION ALLERGIES:  None.    MEDICATIONS:  1.  Amitriptyline.  2.  Levothyroxine.  3.  Protonix.    REVIEW OF SYSTEMS:  As above.  No cardiac or pulmonary problems.  Otherwise, all  other systems reviewed and negative.    PHYSICAL EXAMINATION:  GENERAL:  She is alert, awake and oriented.    VITAL SIGNS:  She is afebrile.    HEART:  She is tachycardic.    HEENT:  Atraumatic.  Eyes not icteric.    NECK:  Trachea midline.    CHEST:  Breathing is not labored.    ABDOMEN:  Soft.  Mild diffuse tenderness.  Moderately distended and midline   incision well healed.    SKIN:  Not jaundiced.    BACK:  With no deformities.    EXTREMITIES:  Expected range of motion.    LABORATORY DATA:  Reviewed.  Sodium 129.  Potassium 3.1.  Chloride 87.  White   count 14.5.  Platelets 613.    RADIOLOGY DATA:   CAT scan of the abdomen and pelvis was done earlier this   afternoon.  I reviewed the images.  The stomach is very distended.  No free air.   No free fluid.    ASSESSMENT:   Possible gastric outlet obstruction at the level of the   gastrojejunostomy.    PLAN:  Will insert an NG tube.  Will obtain an upper GI on Monday morning versus  an upper endoscopy.  Meanwhile aggressive fluid resuscitation and electrolyte   replacement.    Dictated by:  Beverly Milch, M.D.    DD: 03/01/2020 16:47:33  DT: 03/04/2020 11:38:00  WM/tam  Job #: [id number]   SIGNATURE LINE    Electronically signed by Donny Pique MD, [place] on 03/18/2020 at 14:13:35 EST

## 2020-03-01 NOTE — Progress Notes (Signed)
 Name :  [name], [name]    DOB :  [id number]    Sex :  Female    MRN :  [id number]    Subjective    POD#4    Feels well except for incisional pain that is slowly improving.    Abdomen slightly distended. She is passing flatus. Silver dressing on - dry  and intact.    850cc bilious NGT output.    Overall doing well, but slow to improve.    Continue tube feeds, encourage getting OOB. Keep NGT for now, maybe clamp  trial tomorrow.    Objective    Vitals & Measurements    **T: **98.5 ??F (Oral) **HR: **108(Peripheral) **RR: **18 **BP: **134/53  **SpO2: **95%    **HT: **160 cm **WT: **51 Kg **BMI: **19.92    Impression and Plan    abdominal pain (Complaint of)    Acute bowel obstruction        Medications    _Inpatient_    amitriptyline, 100 mg= 2 tab(s), PO, HS    bacitracin-polymyxin B topical, 1 EA, TOP, ud, PRN    Cepacol Regular Strength lozenge, 1 EA, PO, ud, PRN    Chloraseptic 1.4% spray, 1 spray(s), PO, q2hr, PRN    D5 in 0.45% NS w/KCL 39mEq/L 1,000 mL, 1000 mL, IV    heparin, 5000 Unit(s)= 1 mL, sc, q8hr    levothyroxine, 125 mcg= 1 tab(s), PO, Daily    metoclopramide, 10 mg= 2 mL, IV Push, q6hr    morPHINE, 3 mg= 0.75 mL, IV Push, q2hr, PRN    morPHINE, 2 mg= 1 mL, IV Push, q2hr, PRN    morPHINE, 1 mg= 0.5 mL, IV Push, q2hr, PRN    ondansetron, 4 mg= 2 mL, IV Push, q6hr, PRN    ondansetron, 4 mg= 2 mL, IV Push, q6hr, PRN    Protonix, 40 mg= 1 EA, IV Push, q24hr    Sodium Chloride 0.9% Flush, 10 mL, Flush, q12hr    Sodium Chloride 0.9% Flush, 10 mL, Flush, ud, PRN    Tylenol 325 mg oral tablet, 650 mg= 2 tab(s), PO, q4hr, PRN    Lab Results    Blood Glucose POC: 141 mg/dL High (82/51/89 84:21:03)    Glucose Lvl: 111 mg/dL High (12/81/18 86:77:37)    BUN: 6 mg/dL Low (36/68/15 94:70:76)    Creatinine: 0.351 mg/dL Low (15/18/34 37:35:78)    Afn Amer Glomerular Filtration Rate: >90 (03/16/20 07:26:00)    Non-Afn Amer Glomerular Filtration Rate: >90 (03/16/20 07:26:00)    Sodium Lvl: 135 mmol/L Low (03/16/20  07:26:00)    Potassium Lvl: 4 mmol/L (03/16/20 07:26:00)    Chloride: 103 mmol/L (03/16/20 07:26:00)    CO2: 27 mmol/L (03/16/20 07:26:00)    Anion Gap: 5 (03/16/20 07:26:00)    Total Protein: 5.2 Gm/dL Low (97/84/78 41:28:20)    Albumin Lvl: 2.2 Gm/dL Low (81/38/87 19:59:74)    Calcium Lvl: 7.8 mg/dL Low (71/85/50 15:86:82)    Phosphorus: 3.2 mg/dL (57/49/35 52:17:47)    Magnesium Lvl: 2 mg/dL (15/95/39 67:28:97)    Bilirubin Total: 0.3 mg/dL (91/50/41 36:43:83)    Alkaline Phosphatase: 111 Units/L (03/16/20 07:26:00)    AST: 9 Units/L (03/16/20 07:26:00)    ALT: 20 Units/L (03/16/20 07:26:00)    Trig: 184 mg/dL High (77/93/96 88:64:84)    Lav Top Tube to Hold: DONE (03/16/20 07:26:00)    Diagnostic Results        ------        SIGNATURE LINE  Electronically signed by Colin Benton MD, Terelle Dobler on 03/16/2020 at  11:51:18 EST

## 2020-03-01 NOTE — Progress Notes (Signed)
 Name :  [name], [name]    DOB :  [id number]    Sex :  Female    MRN :  [id number]    Subjective    doing ok    continues to dry heave    but tolerating TF ok    + flattus and BM    TF at goal    Objective    Vitals & Measurements    **T: **98.7 ??F (Oral) **HR: **104(Peripheral) **RR: **18 **BP: **150/63  **SpO2: **95%    **HT: **160 cm **WT: **51 Kg **BMI: **19.92    aao    abd soft NT ND    Impression and Plan    Acute bowel obstruction    s/p partial gastrect/B II then redo anastomosis    switch TF to 16 hrs night cycle    keep NPo except ice chips    zofran reglan ATC          magnesium hydroxide, Dose: 30 mL, Susp, PO, q6hr, PRN, Constipation, For: 2  time(s)/dose(s), First Dose Date/Time: 03/29/20 9:05:00 EDT    ondansetron, Dose: 4 mg = 2 mL, Injection, IV Push, q6hr, First Dose  Date/Time: 03/29/20 19:00:00 EDT, between Reglan scheduled    Consult to Nutrition    Enteral Feeds    Miscellaneous Nursing Task    Miscellaneous Nursing Task        Medications    _Inpatient_    amitriptyline, 100 mg= 2 tab(s), PO, HS    aspirin, 81 mg= 1 tab(s), PO, Daily    bacitracin-polymyxin B topical, 1 EA, TOP, ud, PRN    Cepacol Regular Strength lozenge, 1 EA, PO, ud, PRN    Chloraseptic 1.4% spray, 1 spray(s), PO, q2hr, PRN    erythromycin 250mg  ec tablet, 250 mg= 6.25 mL, NG, q6hr    heparin, 5000 Unit(s)= 1 mL, sc, q8hr    levothyroxine, 125 mcg= 1 tab(s), PO, Daily    metoclopramide, 10 mg= 2 mL, IV Push, q6hr    Milk of Magnesia 8% oral suspension, 2400 mg= 30 mL, PO, q6hr, PRN    morPHINE, 3 mg= 0.75 mL, IV Push, q4hr, PRN    morPHINE, 2 mg= 1 mL, IV Push, q2hr, PRN    morPHINE, 1 mg= 0.5 mL, IV Push, q2hr, PRN    Protonix, 40 mg= 1 EA, IV Push, q24hr    Sodium Chloride 0.9% Flush, 10 mL, Flush, q12hr    Sodium Chloride 0.9% Flush, 10 mL, Flush, ud, PRN    Tylenol 325 mg oral tablet, 650 mg= 2 tab(s), PO, q4hr, PRN    Zofran, 4 mg= 2 mL, IV Push, q6hr    Lab Results    Blood Glucose POC: 127 mg/dL High (50/51/83  35:82:51)    Glucose Lvl: 131 mg/dL High (89/84/21 03:12:81)    BUN: 12 mg/dL (18/86/77 37:36:68)    Creatinine: 0.418 mg/dL Low (15/94/70 76:15:18)    Afn Amer Glomerular Filtration Rate: >90 (03/29/20 06:22:00)    Non-Afn Amer Glomerular Filtration Rate: >90 (03/29/20 06:22:00)    Sodium Lvl: 136 mmol/L (03/29/20 06:22:00)    Potassium Lvl: 3.8 mmol/L (03/29/20 06:22:00)    Chloride: 103 mmol/L (03/29/20 06:22:00)    CO2: 28 mmol/L (03/29/20 06:22:00)    Anion Gap: 5 (03/29/20 06:22:00)    Total Protein: 5.6 Gm/dL Low (34/37/35 78:97:84)    Albumin Lvl: 2.6 Gm/dL Low (78/41/28 20:81:38)    Calcium Lvl: 7.8 mg/dL Low (87/19/59 74:71:85)    Phosphorus: 2.6 mg/dL (50/15/86  06:22:00)    Magnesium Lvl: 2.2 mg/dL (48/34/75 83:07:46)    Bilirubin Total: 0.2 mg/dL (00/29/84 73:08:56)    Alkaline Phosphatase: 163 Units/L High (03/29/20 06:22:00)    AST: 32 Units/L (03/29/20 06:22:00)    ALT: 52 Units/L (03/29/20 06:22:00)    WBC: 16.9 thous/mm3 High (03/29/20 06:22:00)    RBC: 3.26 Mil/mm3 Low (03/29/20 06:22:00)    Hgb: 7 Gm/dL Low (94/37/00 52:59:10)    Hct: 23.3 % Low (03/29/20 06:22:00)    Platelet: 401 thous/mm3 High (03/29/20 06:22:00)    MCV: 71.5 fL Low (03/29/20 06:22:00)    MCH: 21.5 pGm Low (03/29/20 06:22:00)    MCHC: 30 Gm/dL Low (28/90/22 84:06:98)    RDW-SD: 50.3 fL (03/29/20 06:22:00)    MPV: 11.7 fL (03/29/20 06:22:00)    Absolute Neutro Count: 13.68 thous/mm3 High (03/29/20 06:22:00)    Absolute Lymphs Count: 1.25 thous/mm3 (03/29/20 06:22:00)    Absolute Mono Count: 0.95 thous/mm3 (03/29/20 06:22:00)    Absolute Eos Count: 0.86 thous/mm3 High (03/29/20 06:22:00)    Absolute Baso Count: 0.04 thous/mm3 (03/29/20 06:22:00)    Neutrophils: 80.8 % (03/29/20 06:22:00)    Lymphocytes: 7.4 % (03/29/20 06:22:00)    Monocytes: 5.6 % (03/29/20 06:22:00)    Eosinophils: 5.1 % (03/29/20 06:22:00)    Basophils: 0.2 % (03/29/20 06:22:00)    Immature Granulocytes: 0.9 % (03/29/20 06:22:00)    NRBC Percent: 0 % (03/29/20  06:22:00)    Absolute NRBC Count: 0 thous/mm3 (03/29/20 06:22:00)    Anisocytosis: 1+ Abnormal (03/29/20 06:22:00)    Microcytes: 1+ Abnormal (03/29/20 06:22:00)    Diagnostic Results        ------        SIGNATURE LINE Electronically signed by Donny Pique MD, [place] on 03/29/2020 at  13:41:41 EST

## 2020-03-01 NOTE — Progress Notes (Signed)
Name :  [name], [name]    DOB :  [id number]    Sex :  Female    MRN :  [id number]    Subjective    weekend events reviewed    vomited saturday, NGT placed, less than 400 cc last 16 hrs        no abd pain, NO N&V    + flattus, multiple BM      Objective    Vitals & Measurements    **T: **98.5 F (Oral) **HR: **88(Peripheral) **RR: **18 **BP: **117/73 **SpO2:  **99%    **HT: **160 cm **WT: **47 Kg **BMI: **19.92    aao    abd soft NT ND        CT images reviewed w radiology, oral contrast goes to stomach and afferent  limb only    Impression and Plan    Acute bowel obstruction    s/p partial gastrect/B II 8/3, redo anastomosis 8/24    d/w Dr Andy Gauss at [facility]    most likely dysmtility/gastroparesis, will hold off on any oral feeding moving  forward    restart Tf and advance very slowly, if not tolerated, will do SBFT thru the  Jtube        bactrim for UTI, will DC        d/w pt and dtr                  Amino Acid / Dextrose TPN 2,000 mL + multivitamin 10 mL + trace elements 1 mL,  Dose: 2,000 mL, Soln, IV, For: 1 time(s)/dose(s), 83.79 ml/hr, 24 hr, 2,011 ml  (TOTAL VOLUME), First dose date/time: 04/15/20 21:00:00 EDT    Basic Metabolic Panel    Basic Metabolic Panel    CBC w/ Indices    Comprehensive Metabolic Panel    Magnesium Level    Magnesium Level    Nasogastric/Orogastric Tube Removal    Phosphorus Level    Phosphorus Level        Medications    _Inpatient_    Amino Acid / Dextrose TPN 2,000 mL + M.V.I.-12 10 mL + Trace Metals 1 mL    Amino Acid / Dextrose TPN 2,000 mL + M.V.I.-12 10 mL + Trace Metals 1 mL    amitriptyline, 100 mg= 2 tab(s), PO, HS    aspirin, 81 mg= 1 tab(s), PO, Daily    bacitracin-polymyxin B topical, 1 EA, TOP, ud, PRN    Ben Gay, 1 app, TOP, TID, PRN    Carafate, 1 Gm= 1 tab(s), PO, QID    Cepacol Regular Strength lozenge, 1 EA, PO, ud, PRN    Chloraseptic 1.4% spray, 1 spray(s), PO, q2hr, PRN    heparin, 5000 Unit(s)= 1 mL, sc, q8hr    levothyroxine, 125 mcg= 1 tab(s), PO,  q24hr    Lidocaine 4% patch, 1 patch(es), Transderm, Daily    Lidocaine Patch Removal, 1 patch(es), Remove, q24hr    Milk of Magnesia 8% oral suspension, 2400 mg= 30 mL, PO, q6hr, PRN    Protonix, 40 mg= 1 EA, IV Push, BID    Reglan, 10 mg= 2 mL, IV Push, q6hr    Sodium Chloride 0.9% Flush, 10 mL, Flush, q12hr    Sodium Chloride 0.9% Flush, 10 mL, Flush, ud, PRN    Tylenol 325 mg oral tablet, 650 mg= 2 tab(s), PO, q4hr, PRN    Zofran, 4 mg= 2 mL, IV Push, q8hr, PRN    Lab Results  Blood Glucose POC: 139 mg/dL High (24/40/10 27:25:36)    Lav Top Tube to Hold: DONE (04/15/20 12:20:00)    Diagnostic Results        ------        Lenox Ponds LINE Electronically signed by Donny Pique MD, [place] on 04/15/2020 at  13:28:13 EST

## 2020-03-01 NOTE — Progress Notes (Signed)
 Name :  [name], [name]    DOB :  [id number]    Sex :  Female    MRN :  [id number]    Subjective        No abdominal pain    Doing very well, extremely comfortable    No flatus or BM      Objective    Vitals & Measurements    **T: **97.8 ??F (Oral) **HR: **105(Peripheral) **RR: **18 **BP: **143/55  **SpO2: **95%    **HT: **160 cm **WT: **51 Kg **BMI: **19.92    AAO    Abdomen soft no tenderness no distention    Incision CDI        NG tube output 800 cc overnight        Labs okay    Impression and Plan    Acute bowel obstruction    Status post partial gastrectomy and Billroth II August 3, redo anastomosis  August 24    Persistent ileus on maximal medical therapy    We will clamp NG tube for 4 hours, open 2 hours    Patient and daughters French Ana and Stidham updated at bedside    Amino Acid / Dextrose TPN 2,000 mL + multivitamin 10 mL + trace elements 1 mL,  Dose: 2,000 mL, Soln, IV, For: 24 hr, 83.33 ml/hr, 24 hr, 2,000 ml (TOTAL  VOLUME), First dose date/time: 03/24/20 21:00:00 EDT    fat emulsion, intravenous 250 mL, Dose: 250 mL, Emulsion, IV, For: 12 hr,  20.83 ml/hr, 250 ml (TOTAL VOLUME), First Dose Date/Time: 03/24/20 21:00:00  EDT        Medications    _Inpatient_    Amino Acid / Dextrose TPN 2,000 mL + M.V.I.-12 10 mL + Trace Metals 1 mL    Amino Acid / Dextrose TPN 2,000 mL + M.V.I.-12 10 mL + Trace Metals 1 mL    amitriptyline, 100 mg= 2 tab(s), PO, HS    aspirin, 81 mg= 1 tab(s), PO, Daily    bacitracin-polymyxin B topical, 1 EA, TOP, ud, PRN    Cepacol Regular Strength lozenge, 1 EA, PO, ud, PRN    Chloraseptic 1.4% spray, 1 spray(s), PO, q2hr, PRN    ciprofloxacin in D5W 400mg /225ml Premix, 400 mg= 200 mL, IV Piggyback, q12hr    erythromycin 250mg  ec tablet, 250 mg= 6.25 mL, GTUBE, q6hr    heparin, 5000 Unit(s)= 1 mL, sc, q8hr    levothyroxine, 125 mcg= 1 tab(s), PO, Daily    Lipids 20% intravenous 250 mL, 250 mL, IV    metoclopramide, 10 mg= 2 mL, IV Push, q6hr    morPHINE, 3 mg= 0.75 mL, IV Push, q4hr,  PRN    morPHINE, 2 mg= 1 mL, IV Push, q2hr, PRN    morPHINE, 1 mg= 0.5 mL, IV Push, q2hr, PRN    ondansetron, 4 mg= 2 mL, IV Push, q6hr, PRN    ondansetron, 4 mg= 2 mL, IV Push, q6hr, PRN    Protonix, 40 mg= 1 EA, IV Push, q24hr    Sodium Chloride 0.9% Flush, 10 mL, Flush, q12hr    Sodium Chloride 0.9% Flush, 10 mL, Flush, ud, PRN    Tylenol 325 mg oral tablet, 650 mg= 2 tab(s), PO, q4hr, PRN    Lab Results    Blood Glucose POC: 140 mg/dL High (16/96/78 93:81:01)    Glucose Lvl: 125 mg/dL High (75/10/25 85:27:78)    BUN: 21 mg/dL (24/23/53 61:44:31)    Creatinine: 0.447 mg/dL Low (54/00/86 76:19:50)  Afn Amer Glomerular Filtration Rate: >90 (03/24/20 08:29:00)    Non-Afn Amer Glomerular Filtration Rate: >90 (03/24/20 08:29:00)    Sodium Lvl: 136 mmol/L (03/24/20 08:29:00)    Potassium Lvl: 4.1 mmol/L (03/24/20 08:29:00)    Chloride: 102 mmol/L (03/24/20 08:29:00)    CO2: 25 mmol/L (03/24/20 08:29:00)    Anion Gap: 9 (03/24/20 08:29:00)    Total Protein: 5.9 Gm/dL Low (03/50/09 38:18:29)    Albumin Lvl: 2.4 Gm/dL Low (93/71/69 67:89:38)    Calcium Lvl: 8.6 mg/dL (05/05/50 02:58:52)    Phosphorus: 3.7 mg/dL (77/82/42 35:36:14)    Magnesium Lvl: 2.1 mg/dL (43/15/40 08:67:61)    Bilirubin Total: 0.2 mg/dL (95/09/32 67:12:45)    Alkaline Phosphatase: 159 Units/L High (03/24/20 08:29:00)    AST: 31 Units/L (03/24/20 08:29:00)    ALT: 33 Units/L (03/24/20 08:29:00)    WBC: 13.6 thous/mm3 High (03/24/20 08:29:00)    RBC: 3.77 Mil/mm3 Low (03/24/20 08:29:00)    Hgb: 8.1 Gm/dL Low (80/99/83 38:25:05)    Hct: 27.1 % Low (03/24/20 08:29:00)    Platelet: 447 thous/mm3 High (03/24/20 08:29:00)    MCV: 71.9 fL Low (03/24/20 08:29:00)    MCH: 21.5 pGm Low (03/24/20 08:29:00)    MCHC: 29.9 Gm/dL Low (39/76/73 41:93:79)    RDW-SD: 48.3 fL (03/24/20 08:29:00)    MPV: 11.2 fL (03/24/20 08:29:00)    Absolute Neutro Count Manual: 9.22 thous/mm3 High (03/24/20 08:29:00)    Absolute Lymphs Count Manual: 0.95 thous/mm3 (03/24/20 08:29:00)     Absolute Mono Count Manual: 1.49 thous/mm3 High (03/24/20 08:29:00)    Absolute Eos Count Manual: 1.08 thous/mm3 High (03/24/20 08:29:00)    Absolute Baso Count Manual: 0 thous/mm3 (03/24/20 08:29:00)    Neutrophils Manual: 68 % (03/24/20 08:29:00)    Lymphs Manual: 7 % (03/24/20 08:29:00)    Monos Manual: 11 % (03/24/20 08:29:00)    Eos Manual: 8 % (03/24/20 08:29:00)    Basos Manual: 0 % (03/24/20 08:29:00)    Metas Manual: 2 % High (03/24/20 08:29:00)    Myelos Manual: 4 % High (03/24/20 08:29:00)    Reactive Lymphs Manual: 0 % (03/24/20 08:29:00)    NRBC Percent: 0 % (03/24/20 08:29:00)    Absolute NRBC Count: 0 thous/mm3 (03/24/20 08:29:00)    RBC Morphology: See Below Abnormal (03/24/20 08:29:00)    Hypochromia: 1+ Abnormal (03/24/20 08:29:00)    Polychrom: 1+ Abnormal (03/24/20 08:29:00)    Anisocytosis: 1+ Abnormal (03/24/20 08:29:00)    Microcytes: 1+ Abnormal (03/24/20 08:29:00)    Diagnostic Results        ------        SIGNATURE LINE Electronically signed by Donny Pique MD, Wassim on 03/24/2020 at  14:42:10 EST

## 2020-03-01 NOTE — Progress Notes (Signed)
 Name :  [name], [name]    DOB :  [id number]    Sex :  Female    MRN :  [id number]    please see DC summary    SIGNATURE LINE Electronically signed by Donny Pique MD, Wassim on 04/01/2020 at  13:30:09 EST

## 2020-03-01 NOTE — Progress Notes (Signed)
 Name :  [name], [name]    DOB :  [id number]    Sex :  Female    MRN :  [id number]    Subjective    Pt s/p B2 wityh gastrojej and revision of b2 with gastro jej both with failed  gastric emptying. Pt receiving tf via j tube and ice chips with orange  popsicles for comfort. Pt reports emesis yesterday but no nausea today.    Objective    Vitals & Measurements    **T: **97.9 ??F (Oral) **HR: **101(Peripheral) **RR: **18 **BP: **115/64  **SpO2: **93%    **HT: **160 cm **WT: **51 Kg **BMI: **19.92    abd soft, NT/ND, J tube in good position.    Impression and Plan    abdominal pain (Complaint of)    S/p B2 with gastroc jej x2 both with failed gastric emptying. Cont TF. Per pt,  she may be discharged after 2 days of no emesis, will conform with Dr.  Donny Pique. Cont all current therapy.    Acute bowel obstruction        Medications    _Inpatient_    amitriptyline, 100 mg= 2 tab(s), PO, HS    aspirin, 81 mg= 1 tab(s), PO, Daily    bacitracin-polymyxin B topical, 1 EA, TOP, ud, PRN    [employer], 1 app, TOP, TID, PRN    Cepacol Regular Strength lozenge, 1 EA, PO, ud, PRN    Chloraseptic 1.4% spray, 1 spray(s), PO, q2hr, PRN    free water bolus, 150 mL, FTUBE, QID    gabapentin, 300 mg= 1 cap(s), PO, q8hr    heparin, 5000 Unit(s)= 1 mL, sc, q8hr    levothyroxine, 125 mcg= 1 tab(s), PO, q24hr    LORazepam, 0.5 mg= 1 tab(s), PO, q6hr, PRN    Milk of Magnesia 8% oral suspension, 2400 mg= 30 mL, PO, q6hr, PRN    morPHINE, 3 mg= 0.75 mL, IV Push, q4hr, PRN    morPHINE, 2 mg= 1 mL, IV Push, q2hr, PRN    Protonix, 40 mg= 1 EA, IV Push, BID    Sodium Chloride 0.9% Flush, 10 mL, Flush, q12hr    Sodium Chloride 0.9% Flush, 10 mL, Flush, ud, PRN    Tylenol 325 mg oral tablet, 650 mg= 2 tab(s), PO, q4hr, PRN    Zofran, 4 mg= 1 tab(s), PO, q6hr, PRN    Lab Results    No labs resulted in the past 24 hours.    Diagnostic Results        ------        SIGNATURE LINE Electronically signed by Chandler Swiderski MD, Oda Kilts on 04/04/2020 at  16:35:43  EST

## 2020-03-01 NOTE — Progress Notes (Signed)
Name :  [name], [name]    DOB :  [id number]    Sex :  Female    MRN :  [id number]    Rapid response called at [facility].        ICU could not attend due to a critical patient in the unit. Patient seen at  bedside. She is alert oriented to person place and time. RN tells me that she  was being moved to a stretcher for a CT abdomen and pelvis that was being done  for nausea,vomiting and abdominal discomfort. RN tells me that while they were  moving the patient to the stretcher, she almost lost consciousness. She was  pale and diaphoretic.She was normotensive, tachycardic. She was placed on 4  liters of oxygen although she did not appear to be in distress. She has no  chest pain,throat pain,arm pain, headache,blurring in vision,dyspnea,lower  extremity weakness,numbness.        Patient underwent antrectomy/partial gastrectomy with Billroth type II  gastrojejunostomy on February 20, 2020. She was discharged on 11 August. Patient  then came to the emergency department again on 13 August with severe abdominal  pain, nausea vomiting and dark emesis. Patient had a CT of the abdomen on  03/01/2020 that showed dilated fluid-filled stomach and efferent loops. This  was consistent with high-grade efferent loop obstruction.She underwent redo  anastomosis on 03/12/2020. She underwent upper GI endoscopy in 04/05/2020 which  was negative.        Physical examination    General: Alert and oriented x 3    Eye: Pupils are equal    HENT: Oral mucosa is moist.    Neck: Supple, Non-tender    Respiratory: on 4 liters of oxygen, Respirations are non labored, Lungs are  clear to auscultation    Cardiovascular: Tachycardia, Regular rhythm, s1 s2 appreciated    Gastrointestinal: Surgical scar, Soft, Non-tender, Non-distended.    Genitourinary: No costovertebral angle tenderness    Musculoskeletal Normal range of motion    Integumentary: Warm    Neurologic: Alert, Oriented to person place and time, fluent speech, anxious,  no facial asymmetry noted,     grade 5/5 power in all extremities, sensations intact.    *****************************************************************    Laboratory Data : Pending.        EKG : Sinus tachycardia, Atrial enlargement. Inferior Q-waves which is old.        PLAN    -Transfer to [facility] for telemetry monitoring    -check CMP, Lactic acid, Troponin,Magnesium,CBC,TSh.    -will get CT angiogram of Chest given his prolonged hospitalization. CT abdomen he was supposed to get.        Critical care time spent 45 minutes.    SIGNATURE LINE Electronically signed by Ninfa Linden MD, Jadira Nierman on 04/08/2020 at  06:21:56 EST

## 2020-03-01 NOTE — Progress Notes (Signed)
 Name :  [name], [name]    DOB :  [id number]    Sex :  Female    MRN :  [id number]    Subjective    vomited yesterday    no pain per se    Objective    Vitals & Measurements    **T: **97.7 ??F (Oral) **HR: **100(Peripheral) **RR: **18 **BP: **136/65  **SpO2: **94%    **HT: **160 cm **WT: **51 Kg **BMI: **19.92    aao    abd soft    mild distention    Impression and Plan    Acute bowel obstruction    reviewed CT at length w Dr Carlynn Purl    pt has ? Peterson hernia, anastomosis widely patent    d/w pt and dtr at length    will proceed to OR for ex lap and JT          Sodium Chloride 0.9% 1,000 mL, Dose: 1,000 mL, Soln, IV, Routine, 75 ml/hr,  13.3 hr, 1,000 ml (TOTAL VOLUME), First dose date/time: 03/11/20 20:14:00 EDT    Comprehensive Metabolic Panel    Magnesium Level    NPO    Phosphorus Level        Medications    _Inpatient_    Amino Acid / Dextrose TPN 1,500 mL + M.V.I.-12 10 mL + Trace Metals 1 mL    amitriptyline, 100 mg= 2 tab(s), PO, HS    bacitracin-polymyxin B topical, 1 EA, TOP, ud, PRN    Cepacol Regular Strength lozenge, 1 EA, PO, ud, PRN    Chloraseptic 1.4% spray, 1 spray(s), PO, q2hr, PRN    heparin, 5000 Unit(s)= 1 mL, sc, q8hr    levothyroxine, 125 mcg= 1 tab(s), PO, Daily    Lipids 20% intravenous 200 mL, 200 mL, IV    metoclopramide, 10 mg= 2 mL, IV Push, q6hr    morPHINE, 2 mg= 0.5 mL, IV Push, q2hr, PRN    morPHINE, 1 mg= 0.5 mL, IV Push, q2hr, PRN    normal saline 1,000 mL, 1000 mL, IV    ondansetron, 4 mg= 2 mL, IV Push, q6hr, PRN    Protonix, 40 mg= 1 EA, IV Push, q24hr    Sodium Chloride 0.9% Flush, 10 mL, Flush, q12hr    Sodium Chloride 0.9% Flush, 10 mL, Flush, ud, PRN    Tylenol 325 mg oral tablet, 650 mg= 2 tab(s), PO, q4hr, PRN    Lab Results    Blood Glucose POC: 108 mg/dL (38/87/19 59:74:71)    Glucose Lvl: 101 mg/dL (85/50/15 86:82:57)    BUN: 20 mg/dL (49/35/52 17:47:15)    Creatinine: 0.5 mg/dL Low (95/39/67 28:97:91)    Afn Amer Glomerular Filtration Rate: >90 (03/12/20 05:51:00)     Non-Afn Amer Glomerular Filtration Rate: 90 ml/min/1.77m2 (03/12/20 05:51:00)    Sodium Lvl: 135 mmol/L Low (03/12/20 05:51:00)    Potassium Lvl: 4.7 mmol/L (03/12/20 05:51:00)    Chloride: 102 mmol/L (03/12/20 05:51:00)    CO2: 29 mmol/L (03/12/20 05:51:00)    Anion Gap: 4 (03/12/20 05:51:00)    Total Protein: 6.1 Gm/dL (50/41/36 43:83:77)    Albumin Lvl: 2.7 Gm/dL Low (93/96/88 64:84:72)    Calcium Lvl: 8.9 mg/dL (02/06/81 88:33:74)    Phosphorus: 4.2 mg/dL (45/14/60 47:99:87)    Magnesium Lvl: 2.3 mg/dL (21/58/72 76:18:48)    Bilirubin Total: 0.4 mg/dL (59/27/63 94:32:00)    Bilirubin Total: 0.4 mg/dL (37/94/44 61:90:12)    Alkaline Phosphatase: 172 Units/L High (03/12/20 05:51:00)    AST:  35 Units/L (03/12/20 05:51:00)    ALT: 68 Units/L High (03/12/20 05:51:00)    ALT: 68 Units/L High (03/12/20 05:51:00)    Diagnostic Results        ------        SIGNATURE LINE Electronically signed by Donny Pique MD, Wassim on 03/12/2020 at  13:24:45 EST

## 2020-03-01 NOTE — Progress Notes (Signed)
 Name :  [name], [name]    DOB :  [id number]    Sex :  Female    MRN :  [id number]    Subjective    doing well today    no N&V    + BM, hungry    Objective    Vitals & Measurements    **T: **98.5 ??F (Oral) **HR: **101 **RR: **24 **BP: **122/106 **SpO2: **97%    **HT: **160 cm **WT: **51 Kg **BMI: **19.92    aao    abd soft NT ND    Impression and Plan    Acute bowel obstruction    long d/w pt and dtr    will continue w conservative management    offered second opinion and transfer to [place], declined for now          Amino Acid / Dextrose TPN 1,000 mL + multivitamin 10 mL + trace elements 1 mL,  Dose: 1,000 mL, Soln, IV, For: 24 hr, 42 ml/hr, 24.1 hr, 1,011 ml (TOTAL  VOLUME), First dose date/time: 04/09/20 21:00:00 EDT    Blood Glucose Monitoring POC    Transfer Pending        Medications    _Inpatient_    Amino Acid / Dextrose TPN 1,000 mL + M.V.I.-12 10 mL + Trace Metals 1 mL    Amino Acid / Dextrose TPN 1,000 mL + M.V.I.-12 10 mL + Trace Metals 1 mL +  thiamine 100 mg    amitriptyline, 100 mg= 2 tab(s), PO, HS    aspirin, 81 mg= 1 tab(s), PO, Daily    bacitracin-polymyxin B topical, 1 EA, TOP, ud, PRN    Ben Gay, 1 app, TOP, TID, PRN    Carafate, 1 Gm= 1 tab(s), PO, QID    Cepacol Regular Strength lozenge, 1 EA, PO, ud, PRN    Chloraseptic 1.4% spray, 1 spray(s), PO, q2hr, PRN    gabapentin, 300 mg= 1 cap(s), PO, q8hr    heparin, 5000 Unit(s)= 1 mL, sc, q8hr    levothyroxine, 125 mcg= 1 tab(s), PO, q24hr    Milk of Magnesia 8% oral suspension, 2400 mg= 30 mL, PO, q6hr, PRN    morPHINE, 3 mg= 0.75 mL, IV Push, q4hr, PRN    morPHINE, 2 mg= 1 mL, IV Push, q2hr, PRN    normal saline 1,000 mL, 1000 mL, IV    Protonix, 40 mg= 1 EA, IV Push, BID    Sodium Chloride 0.9% Flush, 10 mL, Flush, q12hr    Sodium Chloride 0.9% Flush, 10 mL, Flush, ud, PRN    Tylenol 325 mg oral tablet, 650 mg= 2 tab(s), PO, q4hr, PRN    Zofran, 4 mg= 2 mL, IV Push, q8hr, PRN    Lab Results    Blood Glucose POC: 115 mg/dL High (67/01/41  03:01:31)    Blood Glucose Testing Reason: Routine capillary blood sugar (04/09/20  06:08:00)    Glucose Lvl: 106 mg/dL (43/88/87 57:97:28)    BUN: 7 mg/dL Low (20/60/15 61:53:79)    Creatinine: 0.329 mg/dL Low (43/27/61 47:09:29)    Afn Amer Glomerular Filtration Rate: >90 (04/09/20 05:35:00)    Non-Afn Amer Glomerular Filtration Rate: >90 (04/09/20 05:35:00)    Sodium Lvl: 142 mmol/L (04/09/20 05:35:00)    Potassium Lvl: 3.9 mmol/L (04/09/20 05:35:00)    Chloride: 112 mmol/L High (04/09/20 05:35:00)    CO2: 26 mmol/L (04/09/20 05:35:00)    Anion Gap: 4 (04/09/20 05:35:00)    Calcium Lvl: 7.7 mg/dL Low (57/47/34 03:70:96)  Phosphorus: 2.4 mg/dL (94/83/47 58:30:74)    Magnesium Lvl: 2 mg/dL (60/02/98 47:30:85)    WBC: 7.3 thous/mm3 (04/09/20 05:35:00)    RBC: 3.11 Mil/mm3 Low (04/09/20 05:35:00)    Hgb: 6.6 Gm/dL Critical (69/43/70 05:25:91)    Hct: 22.4 % Low (04/09/20 08:08:00)    Platelet: 258 thous/mm3 (04/09/20 05:35:00)    MCV: 73.3 fL Low (04/09/20 05:35:00)    MCH: 20.9 pGm Low (04/09/20 05:35:00)    MCHC: 28.5 Gm/dL Low (02/89/02 28:40:69)    RDW-SD: 52.8 fL High (04/09/20 05:35:00)    MPV: 11.5 fL (04/09/20 05:35:00)    Absolute Neutro Count: 4.96 thous/mm3 (04/09/20 05:35:00)    Absolute Lymphs Count: 1.15 thous/mm3 (04/09/20 05:35:00)    Absolute Mono Count: 0.67 thous/mm3 (04/09/20 05:35:00)    Absolute Eos Count: 0.43 thous/mm3 (04/09/20 05:35:00)    Absolute Baso Count: 0.01 thous/mm3 (04/09/20 05:35:00)    Neutrophils: 68.4 % (04/09/20 05:35:00)    Lymphocytes: 15.8 % (04/09/20 05:35:00)    Monocytes: 9.2 % (04/09/20 05:35:00)    Eosinophils: 5.9 % (04/09/20 05:35:00)    Basophils: 0.1 % (04/09/20 05:35:00)    Immature Granulocytes: 0.6 % (04/09/20 05:35:00)    NRBC Percent: 0 % (04/09/20 05:35:00)    Absolute NRBC Count: 0 thous/mm3 (04/09/20 05:35:00)    Hypochromia: 1+ Abnormal (04/09/20 05:35:00)    Polychrom: 1+ Abnormal (04/09/20 05:35:00)    Anisocytosis: 1+ Abnormal (04/09/20 05:35:00)     Microcytes: 1+ Abnormal (04/09/20 05:35:00)    Teardrops: 1+ Abnormal (04/09/20 05:35:00)    Platelet Morph: Enlarged Abnormal (04/09/20 05:35:00)    Diagnostic Results        ------        SIGNATURE LINE Electronically signed by Donny Pique MD, Wassim on 04/09/2020 at  14:47:59 EST

## 2020-03-01 NOTE — Progress Notes (Signed)
 Name :  [name], [name]    DOB :  [id number]    Sex :  Female    MRN :  [id number]    Subjective    seen at noon    events noted, vomits once a day, feels much better afterwards, eats, fills up  again, vomits    + flatus, + BM    vasovagal yesterday, resolved    no cardiac issues overnight    Objective    Vitals & Measurements    **T: **97.9 ??F (Oral) **HR: **103 **RR: **14 **BP: **111/50 **SpO2: **95%    **HT: **160 cm **WT: **51 Kg **BMI: **19.92    aao    abd soft NT ND        CTA chest, neg for PE        Ct abd images reviewed, dilated SB, decompressed distal small bowel, ?  obstruction    Impression and Plan    Acute bowel obstruction    had a long conversation w pt and dtr    extremely frustrating situation    if not much better in 1-2 days, will need ex lap    offered transfer to [place], they will think about it        start TPN      Amino Acid / Dextrose TPN 1,000 mL + multivitamin 10 mL + trace elements 1 mL  + thiamine 100 mg, Dose: 1,000 mL, Soln, IV, For: 24 hr, 42 ml/hr, 24.1 hr,  1,012 ml (TOTAL VOLUME), First dose date/time: 04/08/20 21:00:00 EDT    Sodium Chloride 0.9% 1,000 mL, Dose: 1,000 mL, Soln, IV, Routine, 125 ml/hr, 8  hr, 1,000 ml (TOTAL VOLUME), First dose date/time: 04/07/20 21:15:00 EDT    Basic Metabolic Panel    CBC w/ Diff    Magnesium Level    Phosphorus Level    Transfer Pending    Vancomycin Level Trough        Medications    _Inpatient_    Amino Acid / Dextrose TPN 1,000 mL + M.V.I.-12 10 mL + Trace Metals 1 mL +  thiamine 100 mg    amitriptyline, 100 mg= 2 tab(s), PO, HS    aspirin, 81 mg= 1 tab(s), PO, Daily    bacitracin-polymyxin B topical, 1 EA, TOP, ud, PRN    Ben Gay, 1 app, TOP, TID, PRN    Carafate, 1 Gm= 1 tab(s), PO, QID    Cepacol Regular Strength lozenge, 1 EA, PO, ud, PRN    Chloraseptic 1.4% spray, 1 spray(s), PO, q2hr, PRN    gabapentin, 300 mg= 1 cap(s), PO, q8hr    heparin, 5000 Unit(s)= 1 mL, sc, q8hr    levothyroxine, 125 mcg= 1 tab(s), PO, q24hr    Milk of  Magnesia 8% oral suspension, 2400 mg= 30 mL, PO, q6hr, PRN    morPHINE, 3 mg= 0.75 mL, IV Push, q4hr, PRN    morPHINE, 2 mg= 1 mL, IV Push, q2hr, PRN    normal saline 1,000 mL, 1000 mL, IV    Protonix, 40 mg= 1 EA, IV Push, BID    Sodium Chloride 0.9% Flush, 10 mL, Flush, q12hr    Sodium Chloride 0.9% Flush, 10 mL, Flush, ud, PRN    Tylenol 325 mg oral tablet, 650 mg= 2 tab(s), PO, q4hr, PRN    Zofran, 4 mg= 2 mL, IV Push, q8hr, PRN    Lab Results    Blood Glucose POC: 112 mg/dL High (93/23/55 73:22:02)  Blood Glucose Testing Reason: Random (04/07/20 19:25:00)    Glucose Lvl: 101 mg/dL (59/56/38 75:64:33)    BUN: 16 mg/dL (29/51/88 41:66:06)    Creatinine: 0.484 mg/dL Low (30/16/01 09:32:35)    Afn Amer Glomerular Filtration Rate: >90 (04/07/20 19:57:00)    Non-Afn Amer Glomerular Filtration Rate: >90 (04/07/20 19:57:00)    Sodium Lvl: 129 mmol/L Low (04/07/20 19:57:00)    Potassium Lvl: 4.6 mmol/L (04/07/20 19:57:00)    Chloride: 96 mmol/L Low (04/07/20 19:57:00)    CO2: 27 mmol/L (04/07/20 19:57:00)    Anion Gap: 6 (04/07/20 19:57:00)    Calcium Lvl: 8.5 mg/dL (57/32/20 25:42:70)    Lactic Acid Lvl: 0.9 mmol/L (04/07/20 19:57:00)    3rd Gen TSH: 1.71 mclU/ml (04/07/20 19:57:00)    Troponin I, High Sensitivity: <6 (04/07/20 21:11:00)    WBC: 15.3 thous/mm3 High (04/07/20 19:57:00)    RBC: 3.68 Mil/mm3 Low (04/07/20 19:57:00)    Hgb: 7.8 Gm/dL Low (62/37/62 83:15:17)    Hct: 26.1 % Low (04/07/20 19:57:00)    Platelet: 304 thous/mm3 (04/07/20 19:57:00)    MCV: 70.9 fL Low (04/07/20 19:57:00)    MCH: 21.2 pGm Low (04/07/20 19:57:00)    MCHC: 29.9 Gm/dL Low (61/60/73 71:06:26)    RDW-SD: 49.9 fL (04/07/20 19:57:00)    MPV: 11.4 fL (04/07/20 19:57:00)    NRBC Percent: 0 % (04/07/20 19:57:00)    Absolute NRBC Count: 0 thous/mm3 (04/07/20 19:57:00)    UA Color: Light Yellow1 (04/07/20 20:50:00)    UA Clarity: Hazy Abnormal (04/07/20 20:50:00)    UA Specific Gravity: 1.027 (04/07/20 20:50:00)    UA pH: 5 (04/07/20  20:50:00)    UA Protein: Negative1 (04/07/20 20:50:00)    UA Glucose: Negative1 (04/07/20 20:50:00)    UA Ketones: Negative1 (04/07/20 20:50:00)    UA Bilirubin: Negative1 (04/07/20 20:50:00)    UA Blood: Small Abnormal (04/07/20 20:50:00)    UA Urobilinogen: Negative1 (04/07/20 20:50:00)    UA Nitrite: Negative1 (04/07/20 20:50:00)    UA Leukocyte Esterase: 500 Abnormal (04/07/20 20:50:00)    UA RBC: 1 /HPF (04/07/20 20:50:00)    UA WBC: 22 /HPF High (04/07/20 20:50:00)    UA WBC Clumps: Rare Abnormal (04/07/20 20:50:00)    UA Squamous Epithelial: <1 (04/07/20 20:50:00)    UA Bacteria: Trace1 Abnormal (04/07/20 20:50:00)    UA Mucous: Few (04/07/20 20:50:00)    Diagnostic Results        ------        SIGNATURE LINE Electronically signed by Donny Pique MD, [place] on 04/08/2020 at  17:18:23 EST

## 2020-03-01 NOTE — Progress Notes (Signed)
Name :  RICHELLE, GLICK    DOB :  [id number]    Sex :  Female    MRN :  [id number]    H&P dictated    ? obstruction at G-J    NGT, IVF,    UGI in 1-2 days    SIGNATURE LINE Electronically signed by Donny Pique MD, [place] on 03/01/2020 at  16:51:37 EST

## 2020-03-01 NOTE — Progress Notes (Signed)
 Name :  [name], [name]    DOB :  [id number]    Sex :  Female    MRN :  [id number]    Subjective    seen at 9am    doing well    no abd pain, no N&V    had a restless nite after stopping gabapentin, will resume    tol TF    + Bm    Objective    Vitals & Measurements    **T: **97.9 ??F (Oral) **HR: **101(Peripheral) **RR: **18 **BP: **144/59  **SpO2: **98%    **HT: **160 cm **WT: **47 Kg **BMI: **18.36    aao    abd soft NT ND    Impression and Plan    abdominal pain (Complaint of)    will increase TF to 30 and advance slowly    continue TPN      Acute bowel obstruction    gabapentin, Dose: 300 mg = 1 cap(s), Cap, PO, TID, First Dose Date/Time:  04/16/20 14:00:00 EDT    Enteral Feeds        Medications    _Inpatient_    Amino Acid / Dextrose TPN 2,000 mL + M.V.I.-12 10 mL + Trace Metals 1 mL    Amino Acid / Dextrose TPN 2,000 mL + M.V.I.-12 10 mL + Trace Metals 1 mL    amitriptyline, 100 mg= 2 tab(s), PO, HS    aspirin, 81 mg= 1 tab(s), PO, Daily    bacitracin-polymyxin B topical, 1 EA, TOP, ud, PRN    Ben Gay, 1 app, TOP, TID, PRN    Carafate, 1 Gm= 1 tab(s), PO, QID    Cepacol Regular Strength lozenge, 1 EA, PO, ud, PRN    Chloraseptic 1.4% spray, 1 spray(s), PO, q2hr, PRN    gabapentin, 300 mg= 1 cap(s), PO, TID    heparin, 5000 Unit(s)= 1 mL, sc, q8hr    levothyroxine, 125 mcg= 1 tab(s), PO, q24hr    Lidocaine 4% patch, 1 patch(es), Transderm, Daily    Lidocaine Patch Removal, 1 patch(es), Remove, q24hr    Milk of Magnesia 8% oral suspension, 2400 mg= 30 mL, PO, q6hr, PRN    Protonix, 40 mg= 1 EA, IV Push, BID    Reglan, 10 mg= 2 mL, IV Push, q6hr    Sodium Chloride 0.9% Flush, 10 mL, Flush, q12hr    Sodium Chloride 0.9% Flush, 10 mL, Flush, ud, PRN    Tylenol 325 mg oral tablet, 650 mg= 2 tab(s), PO, q4hr, PRN    Zofran, 4 mg= 2 mL, IV Push, q8hr, PRN    Lab Results    Blood Glucose POC: 140 mg/dL High (42/59/56 38:75:64)    Glucose Lvl: 95 mg/dL (33/29/51 88:41:66)    BUN: 15 mg/dL (01/17/15 01:09:32)     Creatinine: 0.318 mg/dL Low (35/57/32 20:25:42)    Afn Amer Glomerular Filtration Rate: >90 (04/16/20 06:18:00)    Non-Afn Amer Glomerular Filtration Rate: >90 (04/16/20 06:18:00)    Sodium Lvl: 138 mmol/L (04/16/20 06:18:00)    Potassium Lvl: 4.6 mmol/L (04/16/20 06:18:00)    Chloride: 107 mmol/L (04/16/20 06:18:00)    CO2: 26 mmol/L (04/16/20 06:18:00)    Anion Gap: 5 (04/16/20 06:18:00)    Total Protein: 5.7 Gm/dL Low (70/62/37 62:83:15)    Albumin Lvl: 2.4 Gm/dL Low (17/61/60 73:71:06)    Calcium Lvl: 8.6 mg/dL (26/94/85 46:27:03)    Phosphorus: 3.1 mg/dL (50/09/38 18:29:93)    Magnesium Lvl: 2.4 mg/dL (71/69/67 89:38:10)  Bilirubin Total: 0.2 mg/dL (71/85/50 15:86:82)    Alkaline Phosphatase: 112 Units/L (04/16/20 06:18:00)    AST: 10 Units/L (04/16/20 06:18:00)    ALT: 28 Units/L (04/16/20 06:18:00)    WBC: 10.3 thous/mm3 (04/16/20 06:18:00)    RBC: 3.6 Mil/mm3 Low (04/16/20 06:18:00)    Hgb: 8 Gm/dL Low (57/49/35 52:17:47)    Hct: 26.5 % Low (04/16/20 06:18:00)    Platelet: 365 thous/mm3 (04/16/20 06:18:00)    MCV: 73.6 fL Low (04/16/20 06:18:00)    MCH: 22.2 pGm Low (04/16/20 06:18:00)    MCHC: 30.2 Gm/dL Low (15/95/39 67:28:97)    RDW-SD: 56.7 fL High (04/16/20 06:18:00)    MPV: 10.7 fL (04/16/20 06:18:00)    NRBC Percent: 0 % (04/16/20 06:18:00)    Absolute NRBC Count: 0 thous/mm3 (04/16/20 06:18:00)    Diagnostic Results        ------        SIGNATURE LINE Electronically signed by Donny Pique MD, Wassim on 04/16/2020 at  20:28:10 EST

## 2020-03-01 NOTE — Progress Notes (Signed)
Name :  [name], [name]    DOB :  [id number]    Sex :  Female    MRN :  [id number]    Subjective    feeling nauseous and distended    min flattus    Objective    Vitals & Measurements    **T: **98.2 F (Oral) **HR: **101(Peripheral) **RR: **18 **BP: **133/63  **SpO2: **94%    **HT: **160 cm **WT: **51 Kg **BMI: **19.92    aao    abd soft, minimal distention        UGI reviewed, min contrast is going thru the anastmosis    Impression and Plan    Acute bowel obstruction    d/w pt and dtr French Ana at length that further observation will not ake any  difference    will proceed to OR in am for revision of nastomosis and ? Roux          heparin, Dose: 5,000 Unit(s) = 1 mL, Injection, sc, q8hr, First Dose  Date/Time: 03/01/20 17:00:00 EDT    morPHINE, Dose: 2 mg = 0.5 mL, Injection, IV Push, q2hr, PRN, Pain, Moderate,  First Dose Date/Time: 03/01/20 16:48:00 EDT    NPO        Medications    _Inpatient_    Amino Acid / Dextrose TPN 1,500 mL + M.V.I.-12 10 mL + Trace Metals 1 mL    Amino Acid / Dextrose TPN 1,500 mL + M.V.I.-12 10 mL + Trace Metals 1 mL    amitriptyline, 100 mg= 2 tab(s), PO, HS    bacitracin-polymyxin B topical, 1 EA, TOP, ud, PRN    Cepacol Regular Strength lozenge, 1 EA, PO, ud, PRN    Chloraseptic 1.4% spray, 1 spray(s), PO, q2hr, PRN    heparin, 5000 Unit(s)= 1 mL, sc, q8hr    levothyroxine, 125 mcg= 1 tab(s), PO, Daily    Lipids 20% intravenous 200 mL, 200 mL, IV    metoclopramide, 10 mg= 2 mL, IV Push, q6hr    morPHINE, 2 mg= 0.5 mL, IV Push, q2hr, PRN    morPHINE, 1 mg= 0.5 mL, IV Push, q2hr, PRN    ondansetron, 4 mg= 2 mL, IV Push, q6hr, PRN    Protonix, 40 mg= 1 EA, IV Push, q24hr    Sodium Chloride 0.9% Flush, 10 mL, Flush, q12hr    Sodium Chloride 0.9% Flush, 10 mL, Flush, ud, PRN    Tylenol 325 mg oral tablet, 650 mg= 2 tab(s), PO, q4hr, PRN    Lab Results    Blood Glucose POC: 145 mg/dL High (52/84/13 24:40:10)    Glucose Lvl: 106 mg/dL (27/25/36 64:40:34)    BUN: 19 mg/dL (74/25/95 63:87:56)     Creatinine: 0.428 mg/dL Low (43/32/95 18:84:16)    Afn Amer Glomerular Filtration Rate: >90 (03/11/20 11:53:00)    Non-Afn Amer Glomerular Filtration Rate: >90 (03/11/20 11:53:00)    Sodium Lvl: 132 mmol/L Low (03/11/20 11:53:00)    Potassium Lvl: 5.6 mmol/L High (03/11/20 11:53:00)    Chloride: 98 mmol/L (03/11/20 11:53:00)    CO2: 29 mmol/L (03/11/20 11:53:00)    Anion Gap: 5 (03/11/20 11:53:00)    Total Protein: 6.6 Gm/dL (60/63/01 60:10:93)    Albumin Lvl: 3 Gm/dL Low (23/55/73 22:02:54)    Calcium Lvl: 8.1 mg/dL Low (27/06/23 76:28:31)    Phosphorus: 3.4 mg/dL (51/76/16 07:37:10)    Magnesium Lvl: 2.5 mg/dL (62/69/48 54:62:70)    Bilirubin Total: 0.3 mg/dL (35/00/93 81:82:99)    Alkaline Phosphatase: 174 Units/L  High (03/11/20 11:53:00)    AST: 68 Units/L High (03/11/20 11:53:00)    ALT: 84 Units/L High (03/11/20 11:53:00)    Trig: 252 mg/dL High (62/95/28 41:32:44)    WBC: 15.1 thous/mm3 High (03/11/20 11:53:00)    RBC: 4.5 Mil/mm3 (03/11/20 11:53:00)    Hgb: 9.6 Gm/dL Low (07/21/70 53:66:44)    Hct: 32.9 % Low (03/11/20 11:53:00)    Platelet: 339 thous/mm3 (03/11/20 11:53:00)    MCV: 73.1 fL Low (03/11/20 11:53:00)    MCH: 21.3 pGm Low (03/11/20 11:53:00)    MCHC: 29.2 Gm/dL Low (03/47/42 59:56:38)    RDW-SD: 47.1 fL (03/11/20 11:53:00)    MPV: 11.5 fL (03/11/20 11:53:00)    NRBC Percent: 0 % (03/11/20 11:53:00)    Absolute NRBC Count: 0 thous/mm3 (03/11/20 11:53:00)    Diagnostic Results        ------        SIGNATURE LINE Electronically signed by Donny Pique MD, [place] on 03/11/2020 at  18:36:40 EST

## 2020-03-01 NOTE — Progress Notes (Signed)
Name :  [name], [name]    DOB :  [id number]    Sex :  Female    MRN :  [id number]    Subjective    Feeling ok, no further nausea since NGT has been replaced. No abdominal pain.  Passing flatus, no BM.    Objective    Vitals & Measurements    **T: **97.3 F (Oral) **HR: **88(Peripheral) **RR: **18 **BP: **141/58 **SpO2:  **95%    **HT: **160 cm **WT: **51 Kg **BMI: **19.92    Gen: awake, alert, NAD    HEENT: NGT with dark bilious output    Resp: non-labored breathing    Abd: soft, nontender, nondistended, incision c/d/i with staples in place    Ext: no LE edema/erythema    Impression and Plan    s/p gastrectomy for outlet obstruction 8/3    Now with outlet obstruction, UGI showing small amount of contrast passing  through the gastrojejunal anastomosis            Replete K    Continue NGT    Continue IVF    Protonix IV    PICC line in place, will start TPN today        Medications    _Inpatient_    amitriptyline, 100 mg= 2 tab(s), PO, HS    bacitracin-polymyxin B topical, 1 EA, TOP, ud, PRN    heparin, 5000 Unit(s)= 1 mL, sc, q8hr    levothyroxine, 125 mcg= 1 tab(s), PO, Daily    morPHINE, 2 mg= 0.5 mL, IV Push, q2hr, PRN    morPHINE, 1 mg= 0.5 mL, IV Push, q2hr, PRN    NaCl 0.9% bolus 1,000 mL, 1000 mL, IV    ondansetron, 4 mg= 2 mL, IV Push, q8hr, PRN    Potassium Chloride IV, 10 mEq= 100 mL, IV Piggyback, q1hr    Protonix, 40 mg= 1 EA, IV Push, q24hr    Sodium Chloride 0.9% 1,000 mL, 1000 mL, IV    Sodium Chloride 0.9% Flush, 10 mL, Flush, q12hr    Sodium Chloride 0.9% Flush, 10 mL, Flush, ud, PRN    Tylenol 325 mg oral tablet, 650 mg= 2 tab(s), PO, q4hr, PRN    Lab Results    Glucose Lvl: 73 mg/dL (30/86/57 84:69:62)    BUN: 5 mg/dL Low (95/28/41 32:44:01)    Creatinine: 0.308 mg/dL Low (02/72/53 66:44:03)    Afn Amer Glomerular Filtration Rate: >90 (03/05/20 10:36:00)    Non-Afn Amer Glomerular Filtration Rate: >90 (03/05/20 10:36:00)    Sodium Lvl: 136 mmol/L (03/05/20 10:36:00)    Potassium Lvl: 3.5  mmol/L Low (03/05/20 10:36:00)    Chloride: 101 mmol/L (03/05/20 10:36:00)    CO2: 22 mmol/L (03/05/20 10:36:00)    Anion Gap: 13 High (03/05/20 10:36:00)    Calcium Lvl: 8.5 mg/dL (47/42/59 56:38:75)    WBC: 11.4 thous/mm3 High (03/05/20 10:36:00)    RBC: 4.34 Mil/mm3 (03/05/20 10:36:00)    Hgb: 9.4 Gm/dL Low (64/33/29 51:88:41)    Hct: 32 % Low (03/05/20 10:36:00)    Platelet: 434 thous/mm3 High (03/05/20 10:36:00)    MCV: 73.7 fL Low (03/05/20 10:36:00)    MCH: 21.7 pGm Low (03/05/20 10:36:00)    MCHC: 29.4 Gm/dL Low (66/06/30 16:01:09)    RDW-SD: 44.3 fL (03/05/20 10:36:00)    MPV: 11.3 fL (03/05/20 10:36:00)    Absolute Neutro Count: 8.92 thous/mm3 High (03/05/20 10:36:00)    Absolute Lymphs Count: 1.19 thous/mm3 (03/05/20 10:36:00)    Absolute Mono Count: 1.01  thous/mm3 (03/05/20 10:36:00)    Absolute Eos Count: 0.21 thous/mm3 (03/05/20 10:36:00)    Absolute Baso Count: 0.02 thous/mm3 (03/05/20 10:36:00)    Neutrophils: 78.3 % (03/05/20 10:36:00)    Lymphocytes: 10.4 % (03/05/20 10:36:00)    Monocytes: 8.9 % (03/05/20 10:36:00)    Eosinophils: 1.8 % (03/05/20 10:36:00)    Basophils: 0.2 % (03/05/20 10:36:00)    Immature Granulocytes: 0.4 % (03/05/20 10:36:00)    NRBC Percent: 0 % (03/05/20 10:36:00)    Absolute NRBC Count: 0 thous/mm3 (03/05/20 10:36:00)    Diagnostic Results        ------        SIGNATURE LINE Electronically signed by Eusebio Friendly MD, Claire Shown on 03/05/2020 at  13:37:03 EST

## 2020-03-01 NOTE — Progress Notes (Signed)
 Name :  [name], [name]    DOB :  [id number]    Sex :  Female    MRN :  [id number]    Subjective    Reporting nausea since her NGT has been out. No emesis. Passing flatus, no BM.    Objective    Vitals & Measurements    **T: **98.4 ??F (Oral) **HR: **90(Peripheral) **RR: **18 **BP: **118/51 **SpO2:  **95%    **HT: **160 cm **WT: **51 Kg **BMI: **19.92    Gen: aao    Abd: soft NT ND, well healed surgical incision    Ext: no LE edema/erythema    Impression and Plan    abdominal pain (Complaint of)    Acute bowel obstruction    metoclopramide, Dose: 5 mg = 1 mL, Injection, IV Push, q6hr, First Dose  Date/Time: 03/09/20 11:00:00 EDT    ondansetron, Dose: 4 mg = 2 mL, Injection, IV Push, q6hr, PRN,  Nausea/Vomiting, First Dose Date/Time: 03/01/20 16:48:00 EDT    Clear Liquid Diet        Continues to have GOO at [facility].    Given nausea since NGT removal, sips of clears only    continue TPN    add reglan 5mg  IV q6    repeat UGI Monday    Continue protonix    Continue sqHep        Medications    _Inpatient_    Amino Acid / Dextrose TPN 1,500 mL + M.V.I.-12 10 mL + Trace Metals 1 mL    amitriptyline, 100 mg= 2 tab(s), PO, HS    bacitracin-polymyxin B topical, 1 EA, TOP, ud, PRN    Cepacol Regular Strength lozenge, 1 EA, PO, ud, PRN    Chloraseptic 1.4% spray, 1 spray(s), PO, q2hr, PRN    heparin, 5000 Unit(s)= 1 mL, sc, q8hr    levothyroxine, 125 mcg= 1 tab(s), PO, Daily    metoclopramide, 5 mg= 1 mL, IV Push, q6hr    morPHINE, 2 mg= 0.5 mL, IV Push, q2hr, PRN    morPHINE, 1 mg= 0.5 mL, IV Push, q2hr, PRN    ondansetron, 4 mg= 2 mL, IV Push, q6hr, PRN    Protonix, 40 mg= 1 EA, IV Push, q24hr    Sodium Chloride 0.9% Flush, 10 mL, Flush, q12hr    Sodium Chloride 0.9% Flush, 10 mL, Flush, ud, PRN    Tylenol 325 mg oral tablet, 650 mg= 2 tab(s), PO, q4hr, PRN    Lab Results    Blood Glucose POC: 131 mg/dL High (00/86/76 19:50:93)    Glucose Lvl: 110 mg/dL (26/71/24 58:09:98)    BUN: 19 mg/dL (33/82/50 53:97:67)    Creatinine:  0.446 mg/dL Low (34/19/37 90:24:09)    Afn Amer Glomerular Filtration Rate: >90 (03/09/20 05:13:00)    Non-Afn Amer Glomerular Filtration Rate: >90 (03/09/20 05:13:00)    Sodium Lvl: 141 mmol/L (03/09/20 05:13:00)    Potassium Lvl: 4.3 mmol/L (03/09/20 05:13:00)    Chloride: 106 mmol/L (03/09/20 05:13:00)    CO2: 31 mmol/L (03/09/20 05:13:00)    Anion Gap: 4 (03/09/20 05:13:00)    Calcium Lvl: 8.6 mg/dL (73/53/29 92:42:68)    Phosphorus: 4 mg/dL (34/19/62 22:97:98)    Magnesium Lvl: 2.3 mg/dL (92/11/94 17:40:81)    Bilirubin Total: 0.2 mg/dL (44/81/85 63:14:97)    ALT: 23 Units/L (03/09/20 05:13:00)    WBC: 8.9 thous/mm3 (03/09/20 05:13:00)    RBC: 4.29 Mil/mm3 (03/09/20 05:13:00)    Hgb: 9.3 Gm/dL Low (02/63/78 58:85:02)  Hct: 31.3 % Low (03/09/20 05:13:00)    Platelet: 414 thous/mm3 High (03/09/20 05:13:00)    MCV: 73 fL Low (03/09/20 05:13:00)    MCH: 21.7 pGm Low (03/09/20 05:13:00)    MCHC: 29.7 Gm/dL Low (41/32/44 01:02:72)    RDW-SD: 46.5 fL (03/09/20 05:13:00)    MPV: 10.4 fL (03/09/20 05:13:00)    Absolute Neutro Count: 5.88 thous/mm3 (03/09/20 05:13:00)    Absolute Lymphs Count: 1.55 thous/mm3 (03/09/20 05:13:00)    Absolute Mono Count: 1.13 thous/mm3 (03/09/20 05:13:00)    Absolute Eos Count: 0.26 thous/mm3 (03/09/20 05:13:00)    Absolute Baso Count: 0.04 thous/mm3 (03/09/20 05:13:00)    Neutrophils: 66.3 % (03/09/20 05:13:00)    Lymphocytes: 17.4 % (03/09/20 05:13:00)    Monocytes: 12.7 % (03/09/20 05:13:00)    Eosinophils: 2.9 % (03/09/20 05:13:00)    Basophils: 0.4 % (03/09/20 05:13:00)    Immature Granulocytes: 0.3 % (03/09/20 05:13:00)    NRBC Percent: 0 % (03/09/20 05:13:00)    Absolute NRBC Count: 0 thous/mm3 (03/09/20 05:13:00)    Diagnostic Results        ------        SIGNATURE LINE Electronically signed by Eusebio Friendly MD, Claire Shown on 03/09/2020 at  10:45:26 EST

## 2020-03-01 NOTE — Progress Notes (Signed)
 Name :  [name], [name]    DOB :  [id number]    Sex :  Female    MRN :  [id number]    Subjective    Patient feels better since admission    NGT 800cc output    No pain    Objective    Vitals & Measurements    **T: **98.2 ??F (Oral) **HR: **93(Peripheral) **RR: **18 **BP: **154/79 **SpO2:  **99%    **HT: **160 cm **WT: **51 Kg **BMI: **19.92    Abdomen soft, nondistended    Impression and Plan        85y female recently s/p partial gastrectomy admitted with pain and vomiting    Probable outlet obstruction at gastrojejunal anastomosis -- probable edema and  gastric ileus        Cont NGT today and monitor output    Probable UGI in the next couple of days to assess the anastomosis    May have ice chips          Medications    _Inpatient_    heparin, 5000 Unit(s)= 1 mL, sc, q8hr    morPHINE, 2 mg= 0.5 mL, IV Push, q2hr, PRN    NaCl 0.9% bolus 1,000 mL, 1000 mL, IV    ondansetron, 4 mg= 2 mL, IV Push, q8hr, PRN    Sodium Chloride 0.9% 1,000 mL, 1000 mL, IV    Tylenol 325 mg oral tablet, 650 mg= 2 tab(s), PO, q4hr, PRN    Lab Results    Glucose Lvl: 85 mg/dL (56/43/32 95:18:84)    BUN: 26 mg/dL High (16/60/63 01:60:10)    Creatinine: 0.59 mg/dL (93/23/55 73:22:02)    Afn Amer Glomerular Filtration Rate: >90 (03/02/20 05:51:00)    Non-Afn Amer Glomerular Filtration Rate: 85 ml/min/1.53m2 (03/02/20 05:51:00)    Sodium Lvl: 134 mmol/L Low (03/02/20 05:51:00)    Potassium Lvl: 5.1 mmol/L (03/02/20 05:51:00)    Chloride: 98 mmol/L (03/02/20 05:51:00)    CO2: 31 mmol/L (03/02/20 05:51:00)    Anion Gap: 5 (03/02/20 05:51:00)    Total Protein: 6.7 Gm/dL (54/27/06 23:76:28)    Albumin Lvl: 3.3 Gm/dL (31/51/76 16:07:37)    Calcium Lvl: 9.3 mg/dL (10/62/69 48:54:62)    Magnesium Lvl: 2.3 mg/dL (70/35/00 93:81:82)    Bilirubin Total: 0.3 mg/dL (99/37/16 96:78:93)    Bilirubin Direct: <0.1 (03/02/20 05:51:00)    Alkaline Phosphatase: 99 Units/L (03/02/20 05:51:00)    AST: 10 Units/L (03/02/20 05:51:00)    ALT: 20 Units/L (03/02/20  05:51:00)    Lipase Lvl: 268 Units/L (03/01/20 13:48:00)    Lactic Acid Lvl: 1 mmol/L (03/01/20 16:30:00)    Troponin I: <0.015 (03/01/20 13:48:00)    WBC: 13.3 thous/mm3 High (03/02/20 05:51:00)    RBC: 4.85 Mil/mm3 (03/02/20 05:51:00)    Hgb: 10.5 Gm/dL Low (81/01/75 10:25:85)    Hct: 35.7 % Low (03/02/20 05:51:00)    Platelet: 471 thous/mm3 High (03/02/20 05:51:00)    MCV: 73.6 fL Low (03/02/20 05:51:00)    MCH: 21.6 pGm Low (03/02/20 05:51:00)    MCHC: 29.4 Gm/dL Low (27/78/24 23:53:61)    RDW-SD: 45 fL (03/02/20 05:51:00)    MPV: 11.7 fL (03/02/20 05:51:00)    Absolute Neutro Count: 12.61 thous/mm3 High (03/01/20 13:48:00)    Absolute Lymphs Count: 1.06 thous/mm3 (03/01/20 13:48:00)    Absolute Mono Count: 0.7 thous/mm3 (03/01/20 13:48:00)    Absolute Eos Count: 0.02 thous/mm3 (03/01/20 13:48:00)    Absolute Baso Count: 0.02 thous/mm3 (03/01/20 13:48:00)    Neutrophils: 87.2 % (  03/01/20 13:48:00)    Lymphocytes: 7.3 % (03/01/20 13:48:00)    Monocytes: 4.8 % (03/01/20 13:48:00)    Eosinophils: 0.1 % (03/01/20 13:48:00)    Basophils: 0.1 % (03/01/20 13:48:00)    Immature Granulocytes: 0.5 % (03/01/20 13:48:00)    NRBC Percent: 0 % (03/02/20 05:51:00)    Absolute NRBC Count: 0 thous/mm3 (03/02/20 05:51:00)    COVID Source: NP Swab (03/01/20 16:30:00)    COVID19 by PCR: Not Detected Panther (03/01/20 16:30:00)    ABO/Rh Type: A POS (03/01/20 13:48:00)    Antibody Screen Automated: Negative (03/01/20 13:48:00)    PT: 12.5 sec High (03/01/20 13:48:00)    INR: 1.2 (03/01/20 13:48:00)    aPTT: 25 sec (03/01/20 13:48:00)    Diagnostic Results        ------        SIGNATURE LINE Electronically signed by Leveda Anna MD, Rosalyn Gess on 03/02/2020 at  10:38:13 EST

## 2020-03-01 NOTE — Progress Notes (Signed)
 Name :  [name], [name]    DOB :  [id number]    Sex :  Female    MRN :  [id number]    Subjective    feels nauseous, but meds takes care of it    no abd pain    + BM    Objective    Vitals & Measurements    **T: **97.8 ??F (Oral) **HR: **103 **RR: **18 **BP: **134/59 **SpO2: **94%    **HT: **160 cm **WT: **47 Kg **BMI: **18.36    aao    abd soft NT ND    CBC CMP ok    Impression and Plan    abdominal pain (Complaint of)    advance TF to 40/hr    if vomits will transfer to [facility] for second opinion      Acute bowel obstruction    gabapentin, Dose: 300 mg = 1 cap(s), Cap, PO, TID, First Dose Date/Time:  04/16/20 14:00:00 EDT    morPHINE, Dose: 2 mg = 1 mL, Injection, IM, q2hr, PRN, Pain, Breakthrough,  First Dose Date/Time: 04/16/20 23:36:00 EDT    Enteral Feeds        Medications    _Inpatient_    Amino Acid / Dextrose TPN 2,000 mL + M.V.I.-12 10 mL + Trace Metals 1 mL    amitriptyline, 100 mg= 2 tab(s), PO, HS    aspirin, 81 mg= 1 tab(s), PO, Daily    bacitracin-polymyxin B topical, 1 EA, TOP, ud, PRN    Ben Gay, 1 app, TOP, TID, PRN    Carafate, 1 Gm= 1 tab(s), PO, QID    Cepacol Regular Strength lozenge, 1 EA, PO, ud, PRN    Chloraseptic 1.4% spray, 1 spray(s), PO, q2hr, PRN    gabapentin, 300 mg= 1 cap(s), PO, TID    heparin, 5000 Unit(s)= 1 mL, sc, q8hr    levothyroxine, 125 mcg= 1 tab(s), PO, q24hr    Lidocaine 4% patch, 1 patch(es), Transderm, Daily    Lidocaine Patch Removal, 1 patch(es), Remove, q24hr    Milk of Magnesia 8% oral suspension, 2400 mg= 30 mL, PO, q6hr, PRN    morPHINE, 2 mg= 1 mL, IM, q2hr, PRN    Protonix, 40 mg= 1 EA, IV Push, BID    Reglan, 10 mg= 2 mL, IV Push, q6hr    Sodium Chloride 0.9% Flush, 10 mL, Flush, q12hr    Sodium Chloride 0.9% Flush, 10 mL, Flush, ud, PRN    Tylenol 325 mg oral tablet, 650 mg= 2 tab(s), PO, q4hr, PRN    Zofran, 4 mg= 2 mL, IV Push, q8hr, PRN    Lab Results    Blood Glucose POC: 119 mg/dL High (17/40/81 44:81:85)    Glucose Lvl: 96 mg/dL (63/14/97 02:63:78)     BUN: 18 mg/dL (58/85/02 77:41:28)    Creatinine: 0.289 mg/dL Low (78/67/67 20:94:70)    Afn Amer Glomerular Filtration Rate: >90 (04/17/20 06:45:00)    Non-Afn Amer Glomerular Filtration Rate: >90 (04/17/20 06:45:00)    Sodium Lvl: 138 mmol/L (04/17/20 06:45:00)    Potassium Lvl: 4.6 mmol/L (04/17/20 06:45:00)    Chloride: 105 mmol/L (04/17/20 06:45:00)    CO2: 26 mmol/L (04/17/20 06:45:00)    Anion Gap: 7 (04/17/20 06:45:00)    Calcium Lvl: 8.8 mg/dL (96/28/36 62:94:76)    Phosphorus: 3.6 mg/dL (54/65/03 54:65:68)    Magnesium Lvl: 2.2 mg/dL (12/75/17 00:17:49)    3rd Gen TSH: 0.775 mclU/ml (04/17/20 06:45:00)    WBC: 7.6 thous/mm3 (04/17/20 06:45:00)  RBC: 3.69 Mil/mm3 Low (04/17/20 06:45:00)    Hgb: 8.2 Gm/dL Low (32/00/37 94:44:61)    Hct: 27.7 % Low (04/17/20 06:45:00)    Platelet: 380 thous/mm3 (04/17/20 06:45:00)    MCV: 75.1 fL Low (04/17/20 06:45:00)    MCH: 22.2 pGm Low (04/17/20 06:45:00)    MCHC: 29.6 Gm/dL Low (90/12/22 41:14:64)    RDW-SD: 58.1 fL High (04/17/20 06:45:00)    MPV: 10.7 fL (04/17/20 06:45:00)    NRBC Percent: 0 % (04/17/20 06:45:00)    Absolute NRBC Count: 0 thous/mm3 (04/17/20 06:45:00)    Diagnostic Results        ------        SIGNATURE LINE Electronically signed by Donny Pique MD, [place] on 04/17/2020 at  10:04:55 EST

## 2020-03-01 NOTE — Progress Notes (Signed)
Name :  [name], [name]    DOB :  [id number]    Sex :  Female    MRN :  [id number]    Subjective    No new complaints    c/o irritation from the NGT    NGT 1300cc/24hr    Objective    Vitals & Measurements    **T: **98 F (Oral) **HR: **99(Peripheral) **RR: **18 **BP: **141/62 **SpO2:  **95%    **HT: **160 cm **WT: **51 Kg **BMI: **19.92    Abd soft    Impression and Plan    Acute bowel obstruction        s/p gastrectomy for outlet obstruction 8/3    Now with outlet obstruction -- ? edema        Will check UGI tomorrow    Will also place PICC line -- minimal intake since surgery per daughter                      morPHINE, Dose: 1 mg = 0.5 mL, Injection, IV Push, q2hr, PRN, Pain, Mild,  First Dose Date/Time: 03/03/20 12:50:00 EDT    Nasogastric/Orogastric Tube Care    XR Upper GI w/ Air Contrast        Medications    _Inpatient_    amitriptyline, 100 mg= 2 tab(s), PO, HS    heparin, 5000 Unit(s)= 1 mL, sc, q8hr    levothyroxine, 125 mcg= 1 tab(s), PO, Daily    morPHINE, 2 mg= 0.5 mL, IV Push, q2hr, PRN    morPHINE, 1 mg= 0.5 mL, IV Push, q2hr, PRN    NaCl 0.9% bolus 1,000 mL, 1000 mL, IV    ondansetron, 4 mg= 2 mL, IV Push, q8hr, PRN    Protonix, 40 mg= 1 EA, IV Push, q24hr    Sodium Chloride 0.9% 1,000 mL, 1000 mL, IV    Tylenol 325 mg oral tablet, 650 mg= 2 tab(s), PO, q4hr, PRN    Lab Results    No labs resulted in the past 24 hours.    Diagnostic Results        ------        SIGNATURE LINE Electronically signed by Leveda Anna MD, Rosalyn Gess on 03/03/2020 at  13:00:27 EST

## 2020-03-01 NOTE — Progress Notes (Signed)
 Name :  [name], [name]    DOB :  [id number]    Sex :  Female    MRN :  [id number]    Subjective    doing well, mostly bothered by NGT    no abd pain    + BM      Objective    Vitals & Measurements    **T: **97.8 ??F (Oral) **HR: **106(Peripheral) **RR: **18 **BP: **135/65  **SpO2: **95%    **HT: **160 cm **WT: **51 Kg **BMI: **19.92    aap    abd soft NT ND    NGT 500cc last 24    Impression and Plan    Acute bowel obstruction    GOO at [facility]    will aloow time for swelling to come up    repeat UGI Monday    if not completely open, will have to redo anastomosis    d/w pt and dtr French Ana          Amino Acid / Dextrose TPN 1,500 mL + multivitamin 10 mL + trace elements 1 mL,  Dose: 1,500 mL, Soln, IV, For: 24 hr, 62.96 ml/hr, 24 hr, 1,511 ml (TOTAL  VOLUME), First dose date/time: 03/08/20 21:00:00 EDT    fat emulsion, intravenous 200 mL, Dose: 200 mL, Emulsion, IV, For: 12 hr,  16.67 ml/hr, 200 ml (TOTAL VOLUME), First Dose Date/Time: 03/08/20 21:00:00  EDT    Basic Metabolic Panel    CBC w/ Diff    Magnesium Level    Nasogastric/Orogastric Tube Removal    Phosphorus Level        Medications    _Inpatient_    Amino Acid / Dextrose TPN 1,200 mL + M.V.I.-12 10 mL + Trace Metals 1 mL    Amino Acid / Dextrose TPN 1,500 mL + M.V.I.-12 10 mL + Trace Metals 1 mL    amitriptyline, 100 mg= 2 tab(s), PO, HS    bacitracin-polymyxin B topical, 1 EA, TOP, ud, PRN    Cepacol Regular Strength lozenge, 1 EA, PO, ud, PRN    Chloraseptic 1.4% spray, 1 spray(s), PO, q2hr, PRN    heparin, 5000 Unit(s)= 1 mL, sc, q8hr    levothyroxine, 125 mcg= 1 tab(s), PO, Daily    Lipids 20% intravenous 200 mL, 200 mL, IV    morPHINE, 2 mg= 0.5 mL, IV Push, q2hr, PRN    morPHINE, 1 mg= 0.5 mL, IV Push, q2hr, PRN    ondansetron, 4 mg= 2 mL, IV Push, q8hr, PRN    Protonix, 40 mg= 1 EA, IV Push, q24hr    Sodium Chloride 0.9% Flush, 10 mL, Flush, q12hr    Sodium Chloride 0.9% Flush, 10 mL, Flush, ud, PRN    Tylenol 325 mg oral tablet, 650 mg= 2 tab(s), PO,  q4hr, PRN    Lab Results    Blood Glucose POC: 146 mg/dL High (60/15/61 53:79:43)    Phosphorus: 3.2 mg/dL (27/61/47 09:29:57)    Magnesium Lvl: 2 mg/dL (47/34/03 70:96:43)    Bilirubin Total: 0.2 mg/dL (83/81/84 03:75:43)    ALT: 20 Units/L (03/08/20 06:38:00)    Diagnostic Results        ------        SIGNATURE LINE Electronically signed by Donny Pique MD, [place] on 03/08/2020 at  16:02:37 EST

## 2020-03-02 LAB — HX HEPATIC FUNCTION PANEL
CASE NUMBER: 2021226000494
HX ALBUMIN LVL: 3.3 g/dL — NL (ref 3.2–5.0)
HX ALKALINE PHOSPHATASE: 99 U/L — NL (ref 30.0–117.0)
HX ALT: 20 U/L — NL (ref 6.0–55.0)
HX AST: 10 U/L — NL (ref 6.0–40.0)
HX BILIRUBIN DIRECT: <0.1 — NL (ref 0.0–0.3)
HX BILIRUBIN TOTAL: 0.3 mg/dL — NL (ref 0.2–1.2)
HX TOTAL PROTEIN: 6.7 g/dL — NL (ref 6.0–8.4)

## 2020-03-02 LAB — HX CBC W/ INDICES
CASE NUMBER: 2021226000494
HX ABSOLUTE NRBC COUNT: 0 10*3/uL
HX HCT: 35.7 % — ABNORMAL LOW (ref 36.0–47.0)
HX HGB: 10.5 g/dL — ABNORMAL LOW (ref 11.8–16.0)
HX MCH: 21.6 pg — ABNORMAL LOW (ref 26.0–34.0)
HX MCHC: 29.4 g/dL — ABNORMAL LOW (ref 31.0–37.0)
HX MCV: 73.6 fL — ABNORMAL LOW (ref 80.0–100.0)
HX MPV: 11.7 fL — NL (ref 9.4–12.4)
HX NRBC PERCENT: 0 % — NL
HX PLATELET: 471 10*3/uL — ABNORMAL HIGH (ref 150.0–400.0)
HX RBC: 4.85 10*6/uL — NL (ref 3.9–5.2)
HX RDW-CV: 17.3 % — ABNORMAL HIGH (ref 11.5–14.5)
HX RDW-SD: 45 fL — NL (ref 35.0–51.0)
HX WBC: 13.3 10*3/uL — ABNORMAL HIGH (ref 3.7–11.2)

## 2020-03-02 LAB — HX BASIC METABOLIC PANEL
CASE NUMBER: 2021226000494
HX ANION GAP: 5 — NL (ref 3.0–11.0)
HX BUN: 26 mg/dL — ABNORMAL HIGH (ref 8.0–23.0)
HX CALCIUM LVL: 9.3 mg/dL — NL (ref 8.5–10.5)
HX CHLORIDE: 98 mmol/L — NL (ref 98.0–110.0)
HX CO2: 31 mmol/L — NL (ref 21.0–32.0)
HX CREATININE: 0.59 mg/dL — NL (ref 0.55–1.3)
HX GLUCOSE LVL: 85 mg/dL — NL (ref 70.0–110.0)
HX POTASSIUM LVL: 5.1 mmol/L — NL (ref 3.6–5.2)
HX SODIUM LVL: 134 mmol/L — ABNORMAL LOW (ref 136.0–146.0)

## 2020-03-02 LAB — HX MAGNESIUM LEVEL
CASE NUMBER: 2021226000494
HX MAGNESIUM LVL: 2.3 mg/dL — NL (ref 1.7–2.5)

## 2020-03-02 LAB — HX GLOMERULAR FILTRATION RATE (ESTIMATED)
CASE NUMBER: 2021226000494
HX AFN AMER GLOMERULAR FILTRATION RATE: >90
HX NON-AFN AMER GLOMERULAR FILTRATION RATE: 85 mL/min/{1.73_m2}

## 2020-03-05 LAB — HX CBC W/ DIFF
CASE NUMBER: 2021229001194
HX ABSOLUTE NRBC COUNT: 0 10*3/uL
HX HCT: 32 % — ABNORMAL LOW (ref 36.0–47.0)
HX HGB: 9.4 g/dL — ABNORMAL LOW (ref 11.8–16.0)
HX MCH: 21.7 pg — ABNORMAL LOW (ref 26.0–34.0)
HX MCHC: 29.4 g/dL — ABNORMAL LOW (ref 31.0–37.0)
HX MCV: 73.7 fL — ABNORMAL LOW (ref 80.0–100.0)
HX MPV: 11.3 fL — NL (ref 9.4–12.4)
HX NRBC PERCENT: 0 % — NL
HX PLATELET: 434 10*3/uL — ABNORMAL HIGH (ref 150.0–400.0)
HX RBC: 4.34 10*6/uL — NL (ref 3.9–5.2)
HX RDW-CV: 16.7 % — ABNORMAL HIGH (ref 11.5–14.5)
HX RDW-SD: 44.3 fL — NL (ref 35.0–51.0)
HX WBC: 11.4 10*3/uL — ABNORMAL HIGH (ref 3.7–11.2)

## 2020-03-05 LAB — HX .AUTOMATED DIFF
CASE NUMBER: 2021229001194
HX ABSOLUTE BASO COUNT: 0.02 10*3/uL — NL (ref 0.0–0.22)
HX ABSOLUTE EOS COUNT: 0.21 10*3/uL — NL (ref 0.0–0.45)
HX ABSOLUTE LYMPHS COUNT: 1.19 10*3/uL — NL (ref 0.74–5.04)
HX ABSOLUTE MONO COUNT: 1.01 10*3/uL — NL (ref 0.0–1.34)
HX ABSOLUTE NEUTRO COUNT: 8.92 10*3/uL — ABNORMAL HIGH (ref 1.48–7.95)
HX BASOPHILS: 0.2 %
HX EOSINOPHILS: 1.8 %
HX IMMATURE GRANULOCYTES: 0.4 % — NL (ref 0.0–2.0)
HX LYMPHOCYTES: 10.4 %
HX MONOCYTES: 8.9 %
HX NEUTROPHILS: 78.3 %

## 2020-03-05 LAB — HX BASIC METABOLIC PANEL
CASE NUMBER: 2021229001194
HX ANION GAP: 13 — ABNORMAL HIGH (ref 3.0–11.0)
HX BUN: 5 mg/dL — ABNORMAL LOW (ref 8.0–23.0)
HX CALCIUM LVL: 8.5 mg/dL — NL (ref 8.5–10.5)
HX CHLORIDE: 101 mmol/L — NL (ref 98.0–110.0)
HX CO2: 22 mmol/L — NL (ref 21.0–32.0)
HX CREATININE: 0.308 mg/dL — ABNORMAL LOW (ref 0.55–1.3)
HX GLUCOSE LVL: 73 mg/dL — NL (ref 70.0–110.0)
HX POTASSIUM LVL: 3.5 mmol/L — ABNORMAL LOW (ref 3.6–5.2)
HX SODIUM LVL: 136 mmol/L — NL (ref 136.0–146.0)

## 2020-03-05 LAB — HX GLOMERULAR FILTRATION RATE (ESTIMATED)
CASE NUMBER: 2021229001194
HX AFN AMER GLOMERULAR FILTRATION RATE: >90
HX NON-AFN AMER GLOMERULAR FILTRATION RATE: >90

## 2020-03-06 LAB — HX CBC W/ INDICES
CASE NUMBER: 2021230000171
HX ABSOLUTE NRBC COUNT: 0 10*3/uL
HX HCT: 29.5 % — ABNORMAL LOW (ref 36.0–47.0)
HX HGB: 8.8 g/dL — ABNORMAL LOW (ref 11.8–16.0)
HX MCH: 21.7 pg — ABNORMAL LOW (ref 26.0–34.0)
HX MCHC: 29.8 g/dL — ABNORMAL LOW (ref 31.0–37.0)
HX MCV: 72.8 fL — ABNORMAL LOW (ref 80.0–100.0)
HX MPV: 11.7 fL — NL (ref 9.4–12.4)
HX NRBC PERCENT: 0 % — NL
HX PLATELET: 451 10*3/uL — ABNORMAL HIGH (ref 150.0–400.0)
HX RBC: 4.05 10*6/uL — NL (ref 3.9–5.2)
HX RDW-CV: 16.8 % — ABNORMAL HIGH (ref 11.5–14.5)
HX RDW-SD: 43.8 fL — NL (ref 35.0–51.0)
HX WBC: 9.7 10*3/uL — NL (ref 3.7–11.2)

## 2020-03-06 LAB — HX COMPREHENSIVE METABOLIC PANEL
CASE NUMBER: 2021230000172
HX ALBUMIN LVL: 2.4 g/dL — ABNORMAL LOW (ref 3.2–5.0)
HX ALKALINE PHOSPHATASE: 82 U/L — NL (ref 30.0–117.0)
HX ALT: 12 U/L — NL (ref 6.0–55.0)
HX ANION GAP: 10 — NL (ref 3.0–11.0)
HX AST: 8 U/L — NL (ref 6.0–40.0)
HX BILIRUBIN TOTAL: 0.4 mg/dL — NL (ref 0.2–1.2)
HX BUN: 4 mg/dL — ABNORMAL LOW (ref 8.0–23.0)
HX CALCIUM LVL: 8.1 mg/dL — ABNORMAL LOW (ref 8.5–10.5)
HX CHLORIDE: 102 mmol/L — NL (ref 98.0–110.0)
HX CO2: 26 mmol/L — NL (ref 21.0–32.0)
HX CREATININE: 0.265 mg/dL — ABNORMAL LOW (ref 0.55–1.3)
HX GLUCOSE LVL: 122 mg/dL — ABNORMAL HIGH (ref 70.0–110.0)
HX POTASSIUM LVL: 3 mmol/L — ABNORMAL LOW (ref 3.6–5.2)
HX SODIUM LVL: 138 mmol/L — NL (ref 136.0–146.0)
HX TOTAL PROTEIN: 5.4 g/dL — ABNORMAL LOW (ref 6.0–8.4)

## 2020-03-06 LAB — HX PHOSPHORUS LEVEL
CASE NUMBER: 2021230000172
HX PHOSPHORUS: 1.9 mg/dL — ABNORMAL LOW (ref 2.4–4.9)

## 2020-03-06 LAB — HX MAGNESIUM LEVEL
CASE NUMBER: 2021230000172
HX MAGNESIUM LVL: 1.7 mg/dL — NL (ref 1.7–2.5)

## 2020-03-06 LAB — HX GLOMERULAR FILTRATION RATE (ESTIMATED)
CASE NUMBER: 2021230000172
HX AFN AMER GLOMERULAR FILTRATION RATE: >90
HX NON-AFN AMER GLOMERULAR FILTRATION RATE: >90

## 2020-03-06 LAB — HX TRIGLYCERIDES
CASE NUMBER: 2021230000172
HX TRIG: 151 mg/dL — ABNORMAL HIGH

## 2020-03-07 LAB — HX MAGNESIUM LEVEL
CASE NUMBER: 2021231000166
HX MAGNESIUM LVL: 1.9 mg/dL — NL (ref 1.7–2.5)

## 2020-03-07 LAB — HX TRIGLYCERIDES
CASE NUMBER: 2021231000166
HX TRIG: 192 mg/dL — ABNORMAL HIGH

## 2020-03-07 LAB — HX PHOSPHORUS LEVEL
CASE NUMBER: 2021231000166
HX PHOSPHORUS: 1.5 mg/dL — ABNORMAL LOW (ref 2.4–4.9)

## 2020-03-07 LAB — HX BILIRUBIN TOTAL
CASE NUMBER: 2021231000166
HX BILIRUBIN TOTAL: 0.3 mg/dL — NL (ref 0.2–1.2)

## 2020-03-07 LAB — HX ALT
CASE NUMBER: 2021231000166
HX ALT: 12 U/L — NL (ref 6.0–55.0)

## 2020-03-07 LAB — HX ELECTROLYTE PANEL
CASE NUMBER: 2021231000166
HX ANION GAP: 8 — NL (ref 3.0–11.0)
HX CHLORIDE: 106 mmol/L — NL (ref 98.0–110.0)
HX CO2: 25 mmol/L — NL (ref 21.0–32.0)
HX POTASSIUM LVL: 4 mmol/L — NL (ref 3.6–5.2)
HX SODIUM LVL: 139 mmol/L — NL (ref 136.0–146.0)

## 2020-03-08 LAB — HX BILIRUBIN TOTAL
CASE NUMBER: 2021232000181
HX BILIRUBIN TOTAL: 0.2 mg/dL — NL (ref 0.2–1.2)

## 2020-03-08 LAB — HX PHOSPHORUS LEVEL
CASE NUMBER: 2021232000181
HX PHOSPHORUS: 3.2 mg/dL — NL (ref 2.4–4.9)

## 2020-03-08 LAB — HX MAGNESIUM LEVEL
CASE NUMBER: 2021232000181
HX MAGNESIUM LVL: 2 mg/dL — NL (ref 1.7–2.5)

## 2020-03-08 LAB — HX ALT
CASE NUMBER: 2021232000181
HX ALT: 20 U/L — NL (ref 6.0–55.0)

## 2020-03-09 LAB — HX CBC W/ DIFF
CASE NUMBER: 2021233000251
HX ABSOLUTE NRBC COUNT: 0 10*3/uL
HX HCT: 31.3 % — ABNORMAL LOW (ref 36.0–47.0)
HX HGB: 9.3 g/dL — ABNORMAL LOW (ref 11.8–16.0)
HX MCH: 21.7 pg — ABNORMAL LOW (ref 26.0–34.0)
HX MCHC: 29.7 g/dL — ABNORMAL LOW (ref 31.0–37.0)
HX MCV: 73 fL — ABNORMAL LOW (ref 80.0–100.0)
HX MPV: 10.4 fL — NL (ref 9.4–12.4)
HX NRBC PERCENT: 0 % — NL
HX PLATELET: 414 10*3/uL — ABNORMAL HIGH (ref 150.0–400.0)
HX RBC: 4.29 10*6/uL — NL (ref 3.9–5.2)
HX RDW-CV: 17.8 % — ABNORMAL HIGH (ref 11.5–14.5)
HX RDW-SD: 46.5 fL — NL (ref 35.0–51.0)
HX WBC: 8.9 10*3/uL — NL (ref 3.7–11.2)

## 2020-03-09 LAB — HX BILIRUBIN TOTAL
CASE NUMBER: 21548027
HX BILIRUBIN TOTAL: 0.2 mg/dL — NL (ref 0.2–1.2)

## 2020-03-09 LAB — HX MAGNESIUM LEVEL
CASE NUMBER: 2021233000251
HX MAGNESIUM LVL: 2.3 mg/dL — NL (ref 1.7–2.5)

## 2020-03-09 LAB — HX BASIC METABOLIC PANEL
CASE NUMBER: 2021233000251
HX ANION GAP: 4 — NL (ref 3.0–11.0)
HX BUN: 19 mg/dL — NL (ref 8.0–23.0)
HX CALCIUM LVL: 8.6 mg/dL — NL (ref 8.5–10.5)
HX CHLORIDE: 106 mmol/L — NL (ref 98.0–110.0)
HX CO2: 31 mmol/L — NL (ref 21.0–32.0)
HX CREATININE: 0.446 mg/dL — ABNORMAL LOW (ref 0.55–1.3)
HX GLUCOSE LVL: 110 mg/dL — NL (ref 70.0–110.0)
HX POTASSIUM LVL: 4.3 mmol/L — NL (ref 3.6–5.2)
HX SODIUM LVL: 141 mmol/L — NL (ref 136.0–146.0)

## 2020-03-09 LAB — HX ALT
CASE NUMBER: 21548027
HX ALT: 23 U/L — NL (ref 6.0–55.0)

## 2020-03-09 LAB — HX .AUTOMATED DIFF
CASE NUMBER: 2021233000251
HX ABSOLUTE BASO COUNT: 0.04 10*3/uL — NL (ref 0.0–0.22)
HX ABSOLUTE EOS COUNT: 0.26 10*3/uL — NL (ref 0.0–0.45)
HX ABSOLUTE LYMPHS COUNT: 1.55 10*3/uL — NL (ref 0.74–5.04)
HX ABSOLUTE MONO COUNT: 1.13 10*3/uL — NL (ref 0.0–1.34)
HX ABSOLUTE NEUTRO COUNT: 5.88 10*3/uL — NL (ref 1.48–7.95)
HX BASOPHILS: 0.4 %
HX EOSINOPHILS: 2.9 %
HX IMMATURE GRANULOCYTES: 0.3 % — NL (ref 0.0–2.0)
HX LYMPHOCYTES: 17.4 %
HX MONOCYTES: 12.7 %
HX NEUTROPHILS: 66.3 %

## 2020-03-09 LAB — HX GLOMERULAR FILTRATION RATE (ESTIMATED)
CASE NUMBER: 2021233000251
HX AFN AMER GLOMERULAR FILTRATION RATE: >90
HX NON-AFN AMER GLOMERULAR FILTRATION RATE: >90

## 2020-03-09 LAB — HX PHOSPHORUS LEVEL
CASE NUMBER: 2021233000251
HX PHOSPHORUS: 4 mg/dL — NL (ref 2.4–4.9)

## 2020-03-10 LAB — HX COMPREHENSIVE METABOLIC PANEL
CASE NUMBER: 21550449
HX ALBUMIN LVL: 2.8 g/dL — ABNORMAL LOW (ref 3.2–5.0)
HX ALKALINE PHOSPHATASE: 141 U/L — ABNORMAL HIGH (ref 30.0–117.0)
HX ALT: 46 U/L — NL (ref 6.0–55.0)
HX ANION GAP: 6 — NL (ref 3.0–11.0)
HX AST: 45 U/L — ABNORMAL HIGH (ref 6.0–40.0)
HX BILIRUBIN TOTAL: 0.2 mg/dL — NL (ref 0.2–1.2)
HX BUN: 24 mg/dL — ABNORMAL HIGH (ref 8.0–23.0)
HX CALCIUM LVL: 8.6 mg/dL — NL (ref 8.5–10.5)
HX CHLORIDE: 103 mmol/L — NL (ref 98.0–110.0)
HX CO2: 27 mmol/L — NL (ref 21.0–32.0)
HX CREATININE: 0.432 mg/dL — ABNORMAL LOW (ref 0.55–1.3)
HX GLUCOSE LVL: 131 mg/dL — ABNORMAL HIGH (ref 70.0–110.0)
HX POTASSIUM LVL: 4.5 mmol/L — NL (ref 3.6–5.2)
HX SODIUM LVL: 136 mmol/L — NL (ref 136.0–146.0)
HX TOTAL PROTEIN: 6 g/dL — NL (ref 6.0–8.4)

## 2020-03-10 LAB — HX CBC W/ INDICES
CASE NUMBER: 2021234000536
HX ABSOLUTE NRBC COUNT: 0 10*3/uL
HX HCT: 31.6 % — ABNORMAL LOW (ref 36.0–47.0)
HX HGB: 9.2 g/dL — ABNORMAL LOW (ref 11.8–16.0)
HX MCH: 21.7 pg — ABNORMAL LOW (ref 26.0–34.0)
HX MCHC: 29.1 g/dL — ABNORMAL LOW (ref 31.0–37.0)
HX MCV: 74.7 fL — ABNORMAL LOW (ref 80.0–100.0)
HX MPV: 11.2 fL — NL (ref 9.4–12.4)
HX NRBC PERCENT: 0 % — NL
HX PLATELET: 362 10*3/uL — NL (ref 150.0–400.0)
HX RBC: 4.23 10*6/uL — NL (ref 3.9–5.2)
HX RDW-CV: 17.8 % — ABNORMAL HIGH (ref 11.5–14.5)
HX RDW-SD: 47.9 fL — NL (ref 35.0–51.0)
HX WBC: 9.9 10*3/uL — NL (ref 3.7–11.2)

## 2020-03-10 LAB — HX PHOSPHORUS LEVEL
CASE NUMBER: 21550449
HX PHOSPHORUS: 3.3 mg/dL — NL (ref 2.4–4.9)

## 2020-03-10 LAB — HX TRIGLYCERIDES
CASE NUMBER: 21550449
HX TRIG: 242 mg/dL — ABNORMAL HIGH

## 2020-03-10 LAB — HX GLOMERULAR FILTRATION RATE (ESTIMATED)
CASE NUMBER: 2021234000179
HX AFN AMER GLOMERULAR FILTRATION RATE: >90
HX NON-AFN AMER GLOMERULAR FILTRATION RATE: >90

## 2020-03-10 LAB — HX MAGNESIUM LEVEL
CASE NUMBER: 21550449
HX MAGNESIUM LVL: 2.7 mg/dL — ABNORMAL HIGH (ref 1.7–2.5)

## 2020-03-11 LAB — HX GLOMERULAR FILTRATION RATE (ESTIMATED)
CASE NUMBER: 2021235001345
HX AFN AMER GLOMERULAR FILTRATION RATE: >90
HX NON-AFN AMER GLOMERULAR FILTRATION RATE: >90

## 2020-03-11 LAB — HX COMPREHENSIVE METABOLIC PANEL
CASE NUMBER: 2021235001345
HX ALBUMIN LVL: 3 g/dL — ABNORMAL LOW (ref 3.2–5.0)
HX ALKALINE PHOSPHATASE: 174 U/L — ABNORMAL HIGH (ref 30.0–117.0)
HX ALT: 84 U/L — ABNORMAL HIGH (ref 6.0–55.0)
HX ANION GAP: 5 — NL (ref 3.0–11.0)
HX AST: 68 U/L — ABNORMAL HIGH (ref 6.0–40.0)
HX BILIRUBIN TOTAL: 0.3 mg/dL — NL (ref 0.2–1.2)
HX BUN: 19 mg/dL — NL (ref 8.0–23.0)
HX CALCIUM LVL: 8.1 mg/dL — ABNORMAL LOW (ref 8.5–10.5)
HX CHLORIDE: 98 mmol/L — NL (ref 98.0–110.0)
HX CO2: 29 mmol/L — NL (ref 21.0–32.0)
HX CREATININE: 0.428 mg/dL — ABNORMAL LOW (ref 0.55–1.3)
HX GLUCOSE LVL: 106 mg/dL — NL (ref 70.0–110.0)
HX POTASSIUM LVL: 5.6 mmol/L — ABNORMAL HIGH (ref 3.6–5.2)
HX SODIUM LVL: 132 mmol/L — ABNORMAL LOW (ref 136.0–146.0)
HX TOTAL PROTEIN: 6.6 g/dL — NL (ref 6.0–8.4)

## 2020-03-11 LAB — HX CBC W/ INDICES
CASE NUMBER: 2021235001345
HX ABSOLUTE NRBC COUNT: 0 10*3/uL
HX HCT: 32.9 % — ABNORMAL LOW (ref 36.0–47.0)
HX HGB: 9.6 g/dL — ABNORMAL LOW (ref 11.8–16.0)
HX MCH: 21.3 pg — ABNORMAL LOW (ref 26.0–34.0)
HX MCHC: 29.2 g/dL — ABNORMAL LOW (ref 31.0–37.0)
HX MCV: 73.1 fL — ABNORMAL LOW (ref 80.0–100.0)
HX MPV: 11.5 fL — NL (ref 9.4–12.4)
HX NRBC PERCENT: 0 % — NL
HX PLATELET: 339 10*3/uL — NL (ref 150.0–400.0)
HX RBC: 4.5 10*6/uL — NL (ref 3.9–5.2)
HX RDW-CV: 17.7 % — ABNORMAL HIGH (ref 11.5–14.5)
HX RDW-SD: 47.1 fL — NL (ref 35.0–51.0)
HX WBC: 15.1 10*3/uL — ABNORMAL HIGH (ref 3.7–11.2)

## 2020-03-11 LAB — HX ALT
CASE NUMBER: 2021235000164
HX ALT: 80 U/L — ABNORMAL HIGH (ref 6.0–55.0)

## 2020-03-11 LAB — HX PHOSPHORUS LEVEL
CASE NUMBER: 21554482
HX PHOSPHORUS: 3.4 mg/dL — NL (ref 2.4–4.9)

## 2020-03-11 LAB — HX MAGNESIUM LEVEL
CASE NUMBER: 21554482
HX MAGNESIUM LVL: 2.5 mg/dL — NL (ref 1.7–2.5)

## 2020-03-11 LAB — HX TRIGLYCERIDES
CASE NUMBER: 21554482
HX TRIG: 252 mg/dL — ABNORMAL HIGH

## 2020-03-11 LAB — HX BILIRUBIN TOTAL
CASE NUMBER: 2021235000164
HX BILIRUBIN TOTAL: 0.2 mg/dL — NL (ref 0.2–1.2)

## 2020-03-12 HISTORY — PX: EXPLORATORY LAPAROTOMY: SHX3000

## 2020-03-12 LAB — HX GLOMERULAR FILTRATION RATE (ESTIMATED)
CASE NUMBER: 2021236000155
HX AFN AMER GLOMERULAR FILTRATION RATE: >90
HX NON-AFN AMER GLOMERULAR FILTRATION RATE: 90 mL/min/{1.73_m2}

## 2020-03-12 LAB — HX COMPREHENSIVE METABOLIC PANEL
CASE NUMBER: 21557578
HX ALBUMIN LVL: 2.7 g/dL — ABNORMAL LOW (ref 3.2–5.0)
HX ALKALINE PHOSPHATASE: 172 U/L — ABNORMAL HIGH (ref 30.0–117.0)
HX ALT: 68 U/L — ABNORMAL HIGH (ref 6.0–55.0)
HX ANION GAP: 4 — NL (ref 3.0–11.0)
HX AST: 35 U/L — NL (ref 6.0–40.0)
HX BILIRUBIN TOTAL: 0.4 mg/dL — NL (ref 0.2–1.2)
HX BUN: 20 mg/dL — NL (ref 8.0–23.0)
HX CALCIUM LVL: 8.9 mg/dL — NL (ref 8.5–10.5)
HX CHLORIDE: 102 mmol/L — NL (ref 98.0–110.0)
HX CO2: 29 mmol/L — NL (ref 21.0–32.0)
HX CREATININE: 0.5 mg/dL — ABNORMAL LOW (ref 0.55–1.3)
HX GLUCOSE LVL: 101 mg/dL — NL (ref 70.0–110.0)
HX POTASSIUM LVL: 4.7 mmol/L — NL (ref 3.6–5.2)
HX SODIUM LVL: 135 mmol/L — ABNORMAL LOW (ref 136.0–146.0)
HX TOTAL PROTEIN: 6.1 g/dL — NL (ref 6.0–8.4)

## 2020-03-12 LAB — HX ALT
CASE NUMBER: 2021236000155
HX ALT: 68 U/L — ABNORMAL HIGH (ref 6.0–55.0)

## 2020-03-12 LAB — HX SARS COV 2 RNA BY PCR
CASE NUMBER: 2021236002635
HX SARS COV 2 RNA BY PCR: NOT DETECTED

## 2020-03-12 LAB — HX PHOSPHORUS LEVEL
CASE NUMBER: 21557578
HX PHOSPHORUS: 4.2 mg/dL — NL (ref 2.4–4.9)

## 2020-03-12 LAB — HX MAGNESIUM LEVEL
CASE NUMBER: 21557578
HX MAGNESIUM LVL: 2.3 mg/dL — NL (ref 1.7–2.5)

## 2020-03-12 LAB — HX BILIRUBIN TOTAL
CASE NUMBER: 2021236000155
HX BILIRUBIN TOTAL: 0.4 mg/dL — NL (ref 0.2–1.2)

## 2020-03-13 LAB — HX .MANUAL DIFF
CASE NUMBER: 2021237000182
HX ABSOLUTE BASO COUNT MANUAL: 0 10*3/uL — NL (ref 0.0–0.22)
HX ABSOLUTE EOS COUNT MANUAL: 0 10*3/uL — NL (ref 0.0–0.45)
HX ABSOLUTE LYMPHS COUNT MANUAL: 0.61 10*3/uL — ABNORMAL LOW (ref 0.74–5.04)
HX ABSOLUTE MONO COUNT MANUAL: 1.53 10*3/uL — ABNORMAL HIGH (ref 0.0–1.34)
HX ABSOLUTE NEUTRO COUNT MANUAL: 28.44 10*3/uL — ABNORMAL HIGH (ref 1.48–7.95)
HX BASOS MANUAL: 0 %
HX EOS MANUAL: 0 %
HX LYMPHS MANUAL: 2 %
HX MONOS MANUAL: 5 %
HX NEUTROPHILS MANUAL: 93 %
HX REACTIVE LYMPHS MANUAL: 0 %

## 2020-03-13 LAB — HX COMPREHENSIVE METABOLIC PANEL
CASE NUMBER: 2021237000182
HX ALBUMIN LVL: 2.4 g/dL — ABNORMAL LOW (ref 3.2–5.0)
HX ALKALINE PHOSPHATASE: 132 U/L — ABNORMAL HIGH (ref 30.0–117.0)
HX ALT: 49 U/L — NL (ref 6.0–55.0)
HX ANION GAP: 5 — NL (ref 3.0–11.0)
HX AST: 21 U/L — NL (ref 6.0–40.0)
HX BILIRUBIN TOTAL: 0.2 mg/dL — NL (ref 0.2–1.2)
HX BUN: 17 mg/dL — NL (ref 8.0–23.0)
HX CALCIUM LVL: 8.3 mg/dL — ABNORMAL LOW (ref 8.5–10.5)
HX CHLORIDE: 106 mmol/L — NL (ref 98.0–110.0)
HX CO2: 26 mmol/L — NL (ref 21.0–32.0)
HX CREATININE: 0.6 mg/dL — NL (ref 0.55–1.3)
HX GLUCOSE LVL: 133 mg/dL — ABNORMAL HIGH (ref 70.0–110.0)
HX POTASSIUM LVL: 5 mmol/L — NL (ref 3.6–5.2)
HX SODIUM LVL: 137 mmol/L — NL (ref 136.0–146.0)
HX TOTAL PROTEIN: 5.3 g/dL — ABNORMAL LOW (ref 6.0–8.4)

## 2020-03-13 LAB — HX CBC W/ INDICES
CASE NUMBER: 2021237000182
HX ABSOLUTE NRBC COUNT: 0 10*3/uL
HX HCT: 31.7 % — ABNORMAL LOW (ref 36.0–47.0)
HX HGB: 9.5 g/dL — ABNORMAL LOW (ref 11.8–16.0)
HX MCH: 22.1 pg — ABNORMAL LOW (ref 26.0–34.0)
HX MCHC: 30 g/dL — ABNORMAL LOW (ref 31.0–37.0)
HX MCV: 73.7 fL — ABNORMAL LOW (ref 80.0–100.0)
HX MPV: 11.2 fL — NL (ref 9.4–12.4)
HX NRBC PERCENT: 0 % — NL
HX PLATELET: 348 10*3/uL — NL (ref 150.0–400.0)
HX RBC: 4.3 10*6/uL — NL (ref 3.9–5.2)
HX RDW-CV: 17.9 % — ABNORMAL HIGH (ref 11.5–14.5)
HX RDW-SD: 47.4 fL — NL (ref 35.0–51.0)
HX WBC: 30.6 10*3/uL — ABNORMAL HIGH (ref 3.7–11.2)

## 2020-03-13 LAB — HX TRIGLYCERIDES
CASE NUMBER: 2021237000182
HX TRIG: 128 mg/dL — NL

## 2020-03-13 LAB — HX MAGNESIUM LEVEL
CASE NUMBER: 2021237000182
HX MAGNESIUM LVL: 2.2 mg/dL — NL (ref 1.7–2.5)

## 2020-03-13 LAB — HX GLOMERULAR FILTRATION RATE (ESTIMATED)
CASE NUMBER: 2021237000182
HX AFN AMER GLOMERULAR FILTRATION RATE: >90
HX NON-AFN AMER GLOMERULAR FILTRATION RATE: 85 mL/min/{1.73_m2}

## 2020-03-13 LAB — HX PHOSPHORUS LEVEL
CASE NUMBER: 2021237000182
HX PHOSPHORUS: 3.3 mg/dL — NL (ref 2.4–4.9)

## 2020-03-14 LAB — HX COMPREHENSIVE METABOLIC PANEL
CASE NUMBER: 2021238000114
HX ALBUMIN LVL: 2.1 g/dL — ABNORMAL LOW (ref 3.2–5.0)
HX ALKALINE PHOSPHATASE: 113 U/L — NL (ref 30.0–117.0)
HX ALT: 30 U/L — NL (ref 6.0–55.0)
HX ANION GAP: 5 — NL (ref 3.0–11.0)
HX AST: 12 U/L — NL (ref 6.0–40.0)
HX BILIRUBIN TOTAL: 0.3 mg/dL — NL (ref 0.2–1.2)
HX BUN: 15 mg/dL — NL (ref 8.0–23.0)
HX CALCIUM LVL: 7.7 mg/dL — ABNORMAL LOW (ref 8.5–10.5)
HX CHLORIDE: 108 mmol/L — NL (ref 98.0–110.0)
HX CO2: 26 mmol/L — NL (ref 21.0–32.0)
HX CREATININE: 0.363 mg/dL — ABNORMAL LOW (ref 0.55–1.3)
HX GLUCOSE LVL: 92 mg/dL — NL (ref 70.0–110.0)
HX POTASSIUM LVL: 4 mmol/L — NL (ref 3.6–5.2)
HX SODIUM LVL: 139 mmol/L — NL (ref 136.0–146.0)
HX TOTAL PROTEIN: 4.6 g/dL — ABNORMAL LOW (ref 6.0–8.4)

## 2020-03-14 LAB — HX PHOSPHORUS LEVEL
CASE NUMBER: 2021238000114
HX PHOSPHORUS: 2.8 mg/dL — NL (ref 2.4–4.9)

## 2020-03-14 LAB — HX .AUTOMATED DIFF
CASE NUMBER: 2021238000463
HX ABSOLUTE BASO COUNT: 0.03 10*3/uL — NL (ref 0.0–0.22)
HX ABSOLUTE EOS COUNT: 0.47 10*3/uL — ABNORMAL HIGH (ref 0.0–0.45)
HX ABSOLUTE LYMPHS COUNT: 1.18 10*3/uL — NL (ref 0.74–5.04)
HX ABSOLUTE MONO COUNT: 1.24 10*3/uL — NL (ref 0.0–1.34)
HX ABSOLUTE NEUTRO COUNT: 11.7 10*3/uL — ABNORMAL HIGH (ref 1.48–7.95)
HX BASOPHILS: 0.2 %
HX EOSINOPHILS: 3.2 %
HX IMMATURE GRANULOCYTES: 0.7 % — NL (ref 0.0–2.0)
HX LYMPHOCYTES: 8 %
HX MONOCYTES: 8.4 %
HX NEUTROPHILS: 79.5 %

## 2020-03-14 LAB — HX SURG PATH FINAL REPORT
CASE NUMBER: 0
HX NOTE: 88307

## 2020-03-14 LAB — HX CBC W/ DIFF
CASE NUMBER: 2021238000463
HX ABSOLUTE NRBC COUNT: 0 10*3/uL
HX HCT: 24.3 % — ABNORMAL LOW (ref 36.0–47.0)
HX HGB: 7.4 g/dL — ABNORMAL LOW (ref 11.8–16.0)
HX MCH: 22 pg — ABNORMAL LOW (ref 26.0–34.0)
HX MCHC: 30.5 g/dL — ABNORMAL LOW (ref 31.0–37.0)
HX MCV: 72.1 fL — ABNORMAL LOW (ref 80.0–100.0)
HX MPV: 11.1 fL — NL (ref 9.4–12.4)
HX NRBC PERCENT: 0 % — NL
HX PLATELET: 225 10*3/uL — NL (ref 150.0–400.0)
HX RBC: 3.37 10*6/uL — ABNORMAL LOW (ref 3.9–5.2)
HX RDW-CV: 18.1 % — ABNORMAL HIGH (ref 11.5–14.5)
HX RDW-SD: 47.3 fL — NL (ref 35.0–51.0)
HX WBC: 14.7 10*3/uL — ABNORMAL HIGH (ref 3.7–11.2)

## 2020-03-14 LAB — HX GLOMERULAR FILTRATION RATE (ESTIMATED)
CASE NUMBER: 2021238000114
HX AFN AMER GLOMERULAR FILTRATION RATE: >90
HX NON-AFN AMER GLOMERULAR FILTRATION RATE: >90

## 2020-03-14 LAB — HX MAGNESIUM LEVEL
CASE NUMBER: 2021238000114
HX MAGNESIUM LVL: 2.2 mg/dL — NL (ref 1.7–2.5)

## 2020-03-15 LAB — HX COMPREHENSIVE METABOLIC PANEL
CASE NUMBER: 2021239000148
HX ALBUMIN LVL: 2 g/dL — ABNORMAL LOW (ref 3.2–5.0)
HX ALKALINE PHOSPHATASE: 109 U/L — NL (ref 30.0–117.0)
HX ALT: 23 U/L — NL (ref 6.0–55.0)
HX ANION GAP: 7 — NL (ref 3.0–11.0)
HX AST: 14 U/L — NL (ref 6.0–40.0)
HX BILIRUBIN TOTAL: 0.4 mg/dL — NL (ref 0.2–1.2)
HX BUN: 9 mg/dL — NL (ref 8.0–23.0)
HX CALCIUM LVL: 7.6 mg/dL — ABNORMAL LOW (ref 8.5–10.5)
HX CHLORIDE: 103 mmol/L — NL (ref 98.0–110.0)
HX CO2: 26 mmol/L — NL (ref 21.0–32.0)
HX CREATININE: 0.315 mg/dL — ABNORMAL LOW (ref 0.55–1.3)
HX GLUCOSE LVL: 107 mg/dL — NL (ref 70.0–110.0)
HX POTASSIUM LVL: 3.2 mmol/L — ABNORMAL LOW (ref 3.6–5.2)
HX SODIUM LVL: 136 mmol/L — NL (ref 136.0–146.0)
HX TOTAL PROTEIN: 4.7 g/dL — ABNORMAL LOW (ref 6.0–8.4)

## 2020-03-15 LAB — HX CBC W/ INDICES
CASE NUMBER: 2021239000457
HX ABSOLUTE NRBC COUNT: 0 10*3/uL
HX HCT: 24.1 % — ABNORMAL LOW (ref 36.0–47.0)
HX HGB: 7.2 g/dL — ABNORMAL LOW (ref 11.8–16.0)
HX MCH: 21.6 pg — ABNORMAL LOW (ref 26.0–34.0)
HX MCHC: 29.9 g/dL — ABNORMAL LOW (ref 31.0–37.0)
HX MCV: 72.4 fL — ABNORMAL LOW (ref 80.0–100.0)
HX MPV: 12 fL — NL (ref 9.4–12.4)
HX NRBC PERCENT: 0 % — NL
HX PLATELET: 223 10*3/uL — NL (ref 150.0–400.0)
HX RBC: 3.33 10*6/uL — ABNORMAL LOW (ref 3.9–5.2)
HX RDW-CV: 18.1 % — ABNORMAL HIGH (ref 11.5–14.5)
HX RDW-SD: 47.7 fL — NL (ref 35.0–51.0)
HX WBC: 10.7 10*3/uL — NL (ref 3.7–11.2)

## 2020-03-15 LAB — HX PHOSPHORUS LEVEL
CASE NUMBER: 2021239000148
HX PHOSPHORUS: 2.3 mg/dL — ABNORMAL LOW (ref 2.4–4.9)

## 2020-03-15 LAB — HX GLOMERULAR FILTRATION RATE (ESTIMATED)
CASE NUMBER: 2021239000148
HX AFN AMER GLOMERULAR FILTRATION RATE: >90
HX NON-AFN AMER GLOMERULAR FILTRATION RATE: >90

## 2020-03-15 LAB — HX MAGNESIUM LEVEL
CASE NUMBER: 2021239000148
HX MAGNESIUM LVL: 2.1 mg/dL — NL (ref 1.7–2.5)

## 2020-03-16 LAB — HX GLOMERULAR FILTRATION RATE (ESTIMATED)
CASE NUMBER: 2021240000158
HX AFN AMER GLOMERULAR FILTRATION RATE: >90
HX NON-AFN AMER GLOMERULAR FILTRATION RATE: >90

## 2020-03-16 LAB — HX COMPREHENSIVE METABOLIC PANEL
CASE NUMBER: 2021240000158
HX ALBUMIN LVL: 2.2 g/dL — ABNORMAL LOW (ref 3.2–5.0)
HX ALKALINE PHOSPHATASE: 111 U/L — NL (ref 30.0–117.0)
HX ALT: 20 U/L — NL (ref 6.0–55.0)
HX ANION GAP: 5 — NL (ref 3.0–11.0)
HX AST: 9 U/L — NL (ref 6.0–40.0)
HX BILIRUBIN TOTAL: 0.3 mg/dL — NL (ref 0.2–1.2)
HX BUN: 6 mg/dL — ABNORMAL LOW (ref 8.0–23.0)
HX CALCIUM LVL: 7.8 mg/dL — ABNORMAL LOW (ref 8.5–10.5)
HX CHLORIDE: 103 mmol/L — NL (ref 98.0–110.0)
HX CO2: 27 mmol/L — NL (ref 21.0–32.0)
HX CREATININE: 0.351 mg/dL — ABNORMAL LOW (ref 0.55–1.3)
HX GLUCOSE LVL: 111 mg/dL — ABNORMAL HIGH (ref 70.0–110.0)
HX POTASSIUM LVL: 4 mmol/L — NL (ref 3.6–5.2)
HX SODIUM LVL: 135 mmol/L — ABNORMAL LOW (ref 136.0–146.0)
HX TOTAL PROTEIN: 5.2 g/dL — ABNORMAL LOW (ref 6.0–8.4)

## 2020-03-16 LAB — HX TRIGLYCERIDES
CASE NUMBER: 21579860
HX TRIG: 184 mg/dL — ABNORMAL HIGH

## 2020-03-16 LAB — HX MAGNESIUM LEVEL
CASE NUMBER: 2021240000158
HX MAGNESIUM LVL: 2 mg/dL — NL (ref 1.7–2.5)

## 2020-03-16 LAB — HX PHOSPHORUS LEVEL
CASE NUMBER: 2021240000158
HX PHOSPHORUS: 3.2 mg/dL — NL (ref 2.4–4.9)

## 2020-03-16 LAB — HX LAVENDER TOP TO HOLD: CASE NUMBER: 2021240000158

## 2020-03-17 LAB — HX COMPREHENSIVE METABOLIC PANEL
CASE NUMBER: 2021241000148
HX ALBUMIN LVL: 2.1 g/dL — ABNORMAL LOW (ref 3.2–5.0)
HX ALKALINE PHOSPHATASE: 111 U/L — NL (ref 30.0–117.0)
HX ALT: 16 U/L — NL (ref 6.0–55.0)
HX ANION GAP: 2 — ABNORMAL LOW (ref 3.0–11.0)
HX AST: 8 U/L — NL (ref 6.0–40.0)
HX BILIRUBIN TOTAL: 0.2 mg/dL — NL (ref 0.2–1.2)
HX BUN: 9 mg/dL — NL (ref 8.0–23.0)
HX CALCIUM LVL: 7.8 mg/dL — ABNORMAL LOW (ref 8.5–10.5)
HX CHLORIDE: 106 mmol/L — NL (ref 98.0–110.0)
HX CO2: 29 mmol/L — NL (ref 21.0–32.0)
HX CREATININE: 0.404 mg/dL — ABNORMAL LOW (ref 0.55–1.3)
HX GLUCOSE LVL: 111 mg/dL — ABNORMAL HIGH (ref 70.0–110.0)
HX POTASSIUM LVL: 4.6 mmol/L — NL (ref 3.6–5.2)
HX SODIUM LVL: 137 mmol/L — NL (ref 136.0–146.0)
HX TOTAL PROTEIN: 5 g/dL — ABNORMAL LOW (ref 6.0–8.4)

## 2020-03-17 LAB — HX CBC W/ DIFF
CASE NUMBER: 2021241000287
HX ABSOLUTE NRBC COUNT: 0 10*3/uL
HX HCT: 24.7 % — ABNORMAL LOW (ref 36.0–47.0)
HX HGB: 7.2 g/dL — ABNORMAL LOW (ref 11.8–16.0)
HX MCH: 21.2 pg — ABNORMAL LOW (ref 26.0–34.0)
HX MCHC: 29.1 g/dL — ABNORMAL LOW (ref 31.0–37.0)
HX MCV: 72.9 fL — ABNORMAL LOW (ref 80.0–100.0)
HX MPV: 11.8 fL — NL (ref 9.4–12.4)
HX NRBC PERCENT: 0 % — NL
HX PLATELET: 259 10*3/uL — NL (ref 150.0–400.0)
HX RBC: 3.39 10*6/uL — ABNORMAL LOW (ref 3.9–5.2)
HX RDW-CV: 18.3 % — ABNORMAL HIGH (ref 11.5–14.5)
HX RDW-SD: 48.3 fL — NL (ref 35.0–51.0)
HX WBC: 9.4 10*3/uL — NL (ref 3.7–11.2)

## 2020-03-17 LAB — HX .AUTOMATED DIFF
CASE NUMBER: 2021241000287
HX ABSOLUTE BASO COUNT: 0.02 10*3/uL — NL (ref 0.0–0.22)
HX ABSOLUTE EOS COUNT: 0.35 10*3/uL — NL (ref 0.0–0.45)
HX ABSOLUTE LYMPHS COUNT: 0.9 10*3/uL — NL (ref 0.74–5.04)
HX ABSOLUTE MONO COUNT: 0.64 10*3/uL — NL (ref 0.0–1.34)
HX ABSOLUTE NEUTRO COUNT: 7.37 10*3/uL — NL (ref 1.48–7.95)
HX BASOPHILS: 0.2 %
HX EOSINOPHILS: 3.7 %
HX IMMATURE GRANULOCYTES: 1 % — NL (ref 0.0–2.0)
HX LYMPHOCYTES: 9.6 %
HX MONOCYTES: 6.8 %
HX NEUTROPHILS: 78.7 %

## 2020-03-17 LAB — HX GLOMERULAR FILTRATION RATE (ESTIMATED)
CASE NUMBER: 2021241000148
HX AFN AMER GLOMERULAR FILTRATION RATE: >90
HX NON-AFN AMER GLOMERULAR FILTRATION RATE: >90

## 2020-03-17 LAB — HX MAGNESIUM LEVEL
CASE NUMBER: 2021241000148
HX MAGNESIUM LVL: 2 mg/dL — NL (ref 1.7–2.5)

## 2020-03-17 LAB — HX PHOSPHORUS LEVEL
CASE NUMBER: 2021241000148
HX PHOSPHORUS: 3.5 mg/dL — NL (ref 2.4–4.9)

## 2020-03-18 ENCOUNTER — Ambulatory Visit

## 2020-03-18 LAB — HX .AUTOMATED DIFF
CASE NUMBER: 2021242000182
HX ABSOLUTE BASO COUNT: 0.03 10*3/uL — NL (ref 0.0–0.22)
HX ABSOLUTE EOS COUNT: 0.01 10*3/uL — NL (ref 0.0–0.45)
HX ABSOLUTE LYMPHS COUNT: 0.75 10*3/uL — NL (ref 0.74–5.04)
HX ABSOLUTE MONO COUNT: 1.19 10*3/uL — NL (ref 0.0–1.34)
HX ABSOLUTE NEUTRO COUNT: 17.25 10*3/uL — ABNORMAL HIGH (ref 1.48–7.95)
HX BASOPHILS: 0.2 %
HX EOSINOPHILS: 0.1 %
HX IMMATURE GRANULOCYTES: 0.6 % — NL (ref 0.0–2.0)
HX LYMPHOCYTES: 3.9 %
HX MONOCYTES: 6.2 %
HX NEUTROPHILS: 89 %

## 2020-03-18 LAB — HX COMPREHENSIVE METABOLIC PANEL
CASE NUMBER: 2021242000162
HX ALBUMIN LVL: 2.1 g/dL — ABNORMAL LOW (ref 3.2–5.0)
HX ALKALINE PHOSPHATASE: 118 U/L — ABNORMAL HIGH (ref 30.0–117.0)
HX ALT: 18 U/L — NL (ref 6.0–55.0)
HX ANION GAP: 5 — NL (ref 3.0–11.0)
HX AST: 14 U/L — NL (ref 6.0–40.0)
HX BILIRUBIN TOTAL: 0.3 mg/dL — NL (ref 0.2–1.2)
HX BUN: 16 mg/dL — NL (ref 8.0–23.0)
HX CALCIUM LVL: 8.1 mg/dL — ABNORMAL LOW (ref 8.5–10.5)
HX CHLORIDE: 107 mmol/L — NL (ref 98.0–110.0)
HX CO2: 25 mmol/L — NL (ref 21.0–32.0)
HX CREATININE: 0.447 mg/dL — ABNORMAL LOW (ref 0.55–1.3)
HX GLUCOSE LVL: 158 mg/dL — ABNORMAL HIGH (ref 70.0–110.0)
HX POTASSIUM LVL: 4.8 mmol/L — NL (ref 3.6–5.2)
HX SODIUM LVL: 137 mmol/L — NL (ref 136.0–146.0)
HX TOTAL PROTEIN: 5.2 g/dL — ABNORMAL LOW (ref 6.0–8.4)

## 2020-03-18 LAB — HX CBC W/ DIFF
CASE NUMBER: 2021242000182
HX ABSOLUTE NRBC COUNT: 0 10*3/uL
HX HCT: 30.7 % — ABNORMAL LOW (ref 36.0–47.0)
HX HGB: 9.2 g/dL — ABNORMAL LOW (ref 11.8–16.0)
HX MCH: 21.4 pg — ABNORMAL LOW (ref 26.0–34.0)
HX MCHC: 30 g/dL — ABNORMAL LOW (ref 31.0–37.0)
HX MCV: 71.6 fL — ABNORMAL LOW (ref 80.0–100.0)
HX MPV: 11.3 fL — NL (ref 9.4–12.4)
HX NRBC PERCENT: 0 % — NL
HX PLATELET: 331 10*3/uL — NL (ref 150.0–400.0)
HX RBC: 4.29 10*6/uL — NL (ref 3.9–5.2)
HX RDW-CV: 18.4 % — ABNORMAL HIGH (ref 11.5–14.5)
HX RDW-SD: 47.1 fL — NL (ref 35.0–51.0)
HX WBC: 19.3 10*3/uL — ABNORMAL HIGH (ref 3.7–11.2)

## 2020-03-18 LAB — HX GLOMERULAR FILTRATION RATE (ESTIMATED)
CASE NUMBER: 2021242000162
HX AFN AMER GLOMERULAR FILTRATION RATE: >90
HX NON-AFN AMER GLOMERULAR FILTRATION RATE: >90

## 2020-03-18 LAB — HX MAGNESIUM LEVEL
CASE NUMBER: 2021242000162
HX MAGNESIUM LVL: 2.1 mg/dL — NL (ref 1.7–2.5)

## 2020-03-18 LAB — HX PHOSPHORUS LEVEL
CASE NUMBER: 2021242000162
HX PHOSPHORUS: 3.5 mg/dL — NL (ref 2.4–4.9)

## 2020-03-19 LAB — HX CBC W/ DIFF
CASE NUMBER: 2021243000184
HX ABSOLUTE NRBC COUNT: 0 10*3/uL
HX HCT: 24.7 % — ABNORMAL LOW (ref 36.0–47.0)
HX HGB: 7.3 g/dL — ABNORMAL LOW (ref 11.8–16.0)
HX MCH: 21.2 pg — ABNORMAL LOW (ref 26.0–34.0)
HX MCHC: 29.6 g/dL — ABNORMAL LOW (ref 31.0–37.0)
HX MCV: 71.6 fL — ABNORMAL LOW (ref 80.0–100.0)
HX MPV: UNDETERMINED — NL (ref 9.4–12.4)
HX NRBC PERCENT: 0 % — NL
HX PLATELET: 248 10*3/uL — NL (ref 150.0–400.0)
HX RBC: 3.45 10*6/uL — ABNORMAL LOW (ref 3.9–5.2)
HX RDW-CV: 18.6 % — ABNORMAL HIGH (ref 11.5–14.5)
HX RDW-SD: 47.9 fL — NL (ref 35.0–51.0)
HX WBC: 11.8 10*3/uL — ABNORMAL HIGH (ref 3.7–11.2)

## 2020-03-19 LAB — HX COMPREHENSIVE METABOLIC PANEL
CASE NUMBER: 2021243000167
HX ALBUMIN LVL: 1.8 g/dL — ABNORMAL LOW (ref 3.2–5.0)
HX ALKALINE PHOSPHATASE: 99 U/L — NL (ref 30.0–117.0)
HX ALT: 15 U/L — NL (ref 6.0–55.0)
HX ANION GAP: 5 — NL (ref 3.0–11.0)
HX AST: 8 U/L — NL (ref 6.0–40.0)
HX BILIRUBIN TOTAL: 0.2 mg/dL — NL (ref 0.2–1.2)
HX BUN: 17 mg/dL — NL (ref 8.0–23.0)
HX CALCIUM LVL: 7.5 mg/dL — ABNORMAL LOW (ref 8.5–10.5)
HX CHLORIDE: 109 mmol/L — NL (ref 98.0–110.0)
HX CO2: 25 mmol/L — NL (ref 21.0–32.0)
HX CREATININE: 0.365 mg/dL — ABNORMAL LOW (ref 0.55–1.3)
HX GLUCOSE LVL: 100 mg/dL — NL (ref 70.0–110.0)
HX POTASSIUM LVL: 4.4 mmol/L — NL (ref 3.6–5.2)
HX SODIUM LVL: 139 mmol/L — NL (ref 136.0–146.0)
HX TOTAL PROTEIN: 4.6 g/dL — ABNORMAL LOW (ref 6.0–8.4)

## 2020-03-19 LAB — HX .AUTOMATED DIFF
CASE NUMBER: 2021243000184
HX ABSOLUTE BASO COUNT: 0.02 10*3/uL — NL (ref 0.0–0.22)
HX ABSOLUTE EOS COUNT: 0.36 10*3/uL — NL (ref 0.0–0.45)
HX ABSOLUTE LYMPHS COUNT: 1.34 10*3/uL — NL (ref 0.74–5.04)
HX ABSOLUTE MONO COUNT: 1.08 10*3/uL — NL (ref 0.0–1.34)
HX ABSOLUTE NEUTRO COUNT: 8.96 10*3/uL — ABNORMAL HIGH (ref 1.48–7.95)
HX BASOPHILS: 0.2 %
HX EOSINOPHILS: 3 %
HX IMMATURE GRANULOCYTES: 0.8 % — NL (ref 0.0–2.0)
HX LYMPHOCYTES: 11.3 %
HX MONOCYTES: 9.1 %
HX NEUTROPHILS: 75.6 %

## 2020-03-19 LAB — HX MAGNESIUM LEVEL
CASE NUMBER: 2021243000167
HX MAGNESIUM LVL: 2.1 mg/dL — NL (ref 1.7–2.5)

## 2020-03-19 LAB — HX GLOMERULAR FILTRATION RATE (ESTIMATED)
CASE NUMBER: 2021243000167
HX AFN AMER GLOMERULAR FILTRATION RATE: >90
HX NON-AFN AMER GLOMERULAR FILTRATION RATE: >90

## 2020-03-19 LAB — HX PHOSPHORUS LEVEL
CASE NUMBER: 2021243000167
HX PHOSPHORUS: 3 mg/dL — NL (ref 2.4–4.9)

## 2020-03-20 LAB — HX COMPREHENSIVE METABOLIC PANEL
CASE NUMBER: 2021244000174
HX ALBUMIN LVL: 2 g/dL — ABNORMAL LOW (ref 3.2–5.0)
HX ALKALINE PHOSPHATASE: 100 U/L — NL (ref 30.0–117.0)
HX ALT: 13 U/L — NL (ref 6.0–55.0)
HX ANION GAP: 4 — NL (ref 3.0–11.0)
HX AST: 9 U/L — NL (ref 6.0–40.0)
HX BILIRUBIN TOTAL: 0.2 mg/dL — NL (ref 0.2–1.2)
HX BUN: 18 mg/dL — NL (ref 8.0–23.0)
HX CALCIUM LVL: 7.5 mg/dL — ABNORMAL LOW (ref 8.5–10.5)
HX CHLORIDE: 107 mmol/L — NL (ref 98.0–110.0)
HX CO2: 28 mmol/L — NL (ref 21.0–32.0)
HX CREATININE: 0.313 mg/dL — ABNORMAL LOW (ref 0.55–1.3)
HX GLUCOSE LVL: 101 mg/dL — NL (ref 70.0–110.0)
HX POTASSIUM LVL: 4 mmol/L — NL (ref 3.6–5.2)
HX SODIUM LVL: 139 mmol/L — NL (ref 136.0–146.0)
HX TOTAL PROTEIN: 4.8 g/dL — ABNORMAL LOW (ref 6.0–8.4)

## 2020-03-20 LAB — HX CBC W/ DIFF
CASE NUMBER: 2021244000194
HX ABSOLUTE NRBC COUNT: 0 10*3/uL
HX HCT: 22.9 % — ABNORMAL LOW (ref 36.0–47.0)
HX HGB: 7 g/dL — ABNORMAL LOW (ref 11.8–16.0)
HX MCH: 21.7 pg — ABNORMAL LOW (ref 26.0–34.0)
HX MCHC: 30.6 g/dL — ABNORMAL LOW (ref 31.0–37.0)
HX MCV: 71.1 fL — ABNORMAL LOW (ref 80.0–100.0)
HX MPV: 11.3 fL — NL (ref 9.4–12.4)
HX NRBC PERCENT: 0 % — NL
HX PLATELET: 304 10*3/uL — NL (ref 150.0–400.0)
HX RBC: 3.22 10*6/uL — ABNORMAL LOW (ref 3.9–5.2)
HX RDW-CV: 18.4 % — ABNORMAL HIGH (ref 11.5–14.5)
HX RDW-SD: 47.2 fL — NL (ref 35.0–51.0)
HX WBC: 9.8 10*3/uL — NL (ref 3.7–11.2)

## 2020-03-20 LAB — HX .AUTOMATED DIFF
CASE NUMBER: 2021244000194
HX ABSOLUTE BASO COUNT: 0.02 10*3/uL — NL (ref 0.0–0.22)
HX ABSOLUTE EOS COUNT: 0.36 10*3/uL — NL (ref 0.0–0.45)
HX ABSOLUTE LYMPHS COUNT: 1.32 10*3/uL — NL (ref 0.74–5.04)
HX ABSOLUTE MONO COUNT: 1.05 10*3/uL — NL (ref 0.0–1.34)
HX ABSOLUTE NEUTRO COUNT: 6.74 10*3/uL — NL (ref 1.48–7.95)
HX BASOPHILS: 0.2 %
HX EOSINOPHILS: 3.7 %
HX IMMATURE GRANULOCYTES: 2.8 % — ABNORMAL HIGH (ref 0.0–2.0)
HX LYMPHOCYTES: 13.5 %
HX MONOCYTES: 10.8 %
HX NEUTROPHILS: 69 %

## 2020-03-20 LAB — HX MAGNESIUM LEVEL
CASE NUMBER: 2021244000174
HX MAGNESIUM LVL: 2.3 mg/dL — NL (ref 1.7–2.5)

## 2020-03-20 LAB — HX GLOMERULAR FILTRATION RATE (ESTIMATED)
CASE NUMBER: 2021244000174
HX AFN AMER GLOMERULAR FILTRATION RATE: >90
HX NON-AFN AMER GLOMERULAR FILTRATION RATE: >90

## 2020-03-20 LAB — HX SST GOLD TUBE TO HOLD: CASE NUMBER: 2021244000174

## 2020-03-20 LAB — HX PHOSPHORUS LEVEL
CASE NUMBER: 2021244000174
HX PHOSPHORUS: 2.8 mg/dL — NL (ref 2.4–4.9)

## 2020-03-21 LAB — HX .AUTOMATED DIFF
CASE NUMBER: 2021245000169
HX ABSOLUTE BASO COUNT: 0.04 10*3/uL — NL (ref 0.0–0.22)
HX ABSOLUTE EOS COUNT: 0.27 10*3/uL — NL (ref 0.0–0.45)
HX ABSOLUTE LYMPHS COUNT: 1.28 10*3/uL — NL (ref 0.74–5.04)
HX ABSOLUTE MONO COUNT: 0.91 10*3/uL — NL (ref 0.0–1.34)
HX ABSOLUTE NEUTRO COUNT: 10.02 10*3/uL — ABNORMAL HIGH (ref 1.48–7.95)
HX BASOPHILS: 0.3 %
HX EOSINOPHILS: 2.1 %
HX IMMATURE GRANULOCYTES: 4.1 % — ABNORMAL HIGH (ref 0.0–2.0)
HX LYMPHOCYTES: 9.8 %
HX MONOCYTES: 7 %
HX NEUTROPHILS: 76.7 %

## 2020-03-21 LAB — HX CBC W/ DIFF
CASE NUMBER: 2021245000169
HX ABSOLUTE NRBC COUNT: 0 10*3/uL
HX HCT: 24.5 % — ABNORMAL LOW (ref 36.0–47.0)
HX HGB: 7.3 g/dL — ABNORMAL LOW (ref 11.8–16.0)
HX MCH: 21.3 pg — ABNORMAL LOW (ref 26.0–34.0)
HX MCHC: 29.8 g/dL — ABNORMAL LOW (ref 31.0–37.0)
HX MCV: 71.6 fL — ABNORMAL LOW (ref 80.0–100.0)
HX MPV: 11.5 fL — NL (ref 9.4–12.4)
HX NRBC PERCENT: 0 % — NL
HX PLATELET: 319 10*3/uL — NL (ref 150.0–400.0)
HX RBC: 3.42 10*6/uL — ABNORMAL LOW (ref 3.9–5.2)
HX RDW-CV: 18.6 % — ABNORMAL HIGH (ref 11.5–14.5)
HX RDW-SD: 47.9 fL — NL (ref 35.0–51.0)
HX WBC: 13 10*3/uL — ABNORMAL HIGH (ref 3.7–11.2)

## 2020-03-21 LAB — HX URINALYSIS (COMPLETE)
CASE NUMBER: 2021245000236
HX UA BILIRUBIN: NEGATIVE — NL
HX UA GLUCOSE: NEGATIVE — NL
HX UA KETONES: NEGATIVE — NL
HX UA LEUKOCYTE ESTERASE: 500 WBC/uL — AB
HX UA NITRITE: NEGATIVE — NL
HX UA PH: 6 — NL (ref 5.0–8.0)
HX UA PROTEIN: 30 mg/dL — AB
HX UA RBC: 18 /HPF — ABNORMAL HIGH (ref 0.0–2.0)
HX UA SPECIFIC GRAVITY: 1.015 — NL (ref 1.003–1.03)
HX UA SQUAMOUS EPITHELIAL: <1 — NL (ref 0.0–5.0)
HX UA UROBILINOGEN: NEGATIVE — NL
HX UA WBC: >182 — ABNORMAL HIGH (ref 0.0–5.0)

## 2020-03-21 LAB — HX COMPREHENSIVE METABOLIC PANEL
CASE NUMBER: 2021245000143
HX ALBUMIN LVL: 2 g/dL — ABNORMAL LOW (ref 3.2–5.0)
HX ALKALINE PHOSPHATASE: 107 U/L — NL (ref 30.0–117.0)
HX ALT: 15 U/L — NL (ref 6.0–55.0)
HX ANION GAP: 8 — NL (ref 3.0–11.0)
HX AST: 14 U/L — NL (ref 6.0–40.0)
HX BILIRUBIN TOTAL: 0.2 mg/dL — NL (ref 0.2–1.2)
HX BUN: 17 mg/dL — NL (ref 8.0–23.0)
HX CALCIUM LVL: 7.6 mg/dL — ABNORMAL LOW (ref 8.5–10.5)
HX CHLORIDE: 101 mmol/L — NL (ref 98.0–110.0)
HX CO2: 25 mmol/L — NL (ref 21.0–32.0)
HX CREATININE: 0.371 mg/dL — ABNORMAL LOW (ref 0.55–1.3)
HX GLUCOSE LVL: 205 mg/dL — ABNORMAL HIGH (ref 70.0–110.0)
HX POTASSIUM LVL: 4.3 mmol/L — NL (ref 3.6–5.2)
HX SODIUM LVL: 134 mmol/L — ABNORMAL LOW (ref 136.0–146.0)
HX TOTAL PROTEIN: 5 g/dL — ABNORMAL LOW (ref 6.0–8.4)

## 2020-03-21 LAB — HX PHOSPHORUS LEVEL
CASE NUMBER: 2021245000143
HX PHOSPHORUS: 3.6 mg/dL — NL (ref 2.4–4.9)

## 2020-03-21 LAB — HX MAGNESIUM LEVEL
CASE NUMBER: 2021245000143
HX MAGNESIUM LVL: 2.6 mg/dL — ABNORMAL HIGH (ref 1.7–2.5)

## 2020-03-21 LAB — HX SST GOLD TUBE TO HOLD: CASE NUMBER: 2021245000143

## 2020-03-21 LAB — HX GLOMERULAR FILTRATION RATE (ESTIMATED)
CASE NUMBER: 2021245000143
HX AFN AMER GLOMERULAR FILTRATION RATE: >90
HX NON-AFN AMER GLOMERULAR FILTRATION RATE: >90

## 2020-03-22 LAB — HX CBC W/ DIFF
CASE NUMBER: 2021246000181
HX ABSOLUTE NRBC COUNT: 0.02 10*3/uL
HX HCT: 24.2 % — ABNORMAL LOW (ref 36.0–47.0)
HX HGB: 7.4 g/dL — ABNORMAL LOW (ref 11.8–16.0)
HX MCH: 21.6 pg — ABNORMAL LOW (ref 26.0–34.0)
HX MCHC: 30.6 g/dL — ABNORMAL LOW (ref 31.0–37.0)
HX MCV: 70.6 fL — ABNORMAL LOW (ref 80.0–100.0)
HX MPV: 11.1 fL — NL (ref 9.4–12.4)
HX NRBC PERCENT: 0.2 % — ABNORMAL HIGH
HX PLATELET: 330 10*3/uL — NL (ref 150.0–400.0)
HX RBC: 3.43 10*6/uL — ABNORMAL LOW (ref 3.9–5.2)
HX RDW-CV: 18.5 % — ABNORMAL HIGH (ref 11.5–14.5)
HX RDW-SD: 46.3 fL — NL (ref 35.0–51.0)
HX WBC: 12.8 10*3/uL — ABNORMAL HIGH (ref 3.7–11.2)

## 2020-03-22 LAB — HX MAGNESIUM LEVEL
CASE NUMBER: 2021246000155
HX MAGNESIUM LVL: 2.2 mg/dL — NL (ref 1.7–2.5)

## 2020-03-22 LAB — HX .AUTOMATED DIFF
CASE NUMBER: 2021246000181
HX ABSOLUTE BASO COUNT: 0.05 10*3/uL — NL (ref 0.0–0.22)
HX ABSOLUTE EOS COUNT: 0.45 10*3/uL — NL (ref 0.0–0.45)
HX ABSOLUTE LYMPHS COUNT: 1.39 10*3/uL — NL (ref 0.74–5.04)
HX ABSOLUTE MONO COUNT: 1.16 10*3/uL — NL (ref 0.0–1.34)
HX ABSOLUTE NEUTRO COUNT: 9.08 10*3/uL — ABNORMAL HIGH (ref 1.48–7.95)
HX BASOPHILS: 0.4 %
HX EOSINOPHILS: 3.5 %
HX IMMATURE GRANULOCYTES: 5.2 % — ABNORMAL HIGH (ref 0.0–2.0)
HX LYMPHOCYTES: 10.9 %
HX MONOCYTES: 9.1 %
HX NEUTROPHILS: 70.9 %

## 2020-03-22 LAB — HX COMPREHENSIVE METABOLIC PANEL
CASE NUMBER: 2021246000155
HX ALBUMIN LVL: 2.1 g/dL — ABNORMAL LOW (ref 3.2–5.0)
HX ALKALINE PHOSPHATASE: 116 U/L — NL (ref 30.0–117.0)
HX ALT: 16 U/L — NL (ref 6.0–55.0)
HX ANION GAP: 3 — NL (ref 3.0–11.0)
HX AST: 12 U/L — NL (ref 6.0–40.0)
HX BILIRUBIN TOTAL: 0.2 mg/dL — NL (ref 0.2–1.2)
HX BUN: 17 mg/dL — NL (ref 8.0–23.0)
HX CALCIUM LVL: 7.7 mg/dL — ABNORMAL LOW (ref 8.5–10.5)
HX CHLORIDE: 105 mmol/L — NL (ref 98.0–110.0)
HX CO2: 27 mmol/L — NL (ref 21.0–32.0)
HX CREATININE: 0.376 mg/dL — ABNORMAL LOW (ref 0.55–1.3)
HX GLUCOSE LVL: 102 mg/dL — NL (ref 70.0–110.0)
HX POTASSIUM LVL: 4.3 mmol/L — NL (ref 3.6–5.2)
HX SODIUM LVL: 135 mmol/L — ABNORMAL LOW (ref 136.0–146.0)
HX TOTAL PROTEIN: 5.1 g/dL — ABNORMAL LOW (ref 6.0–8.4)

## 2020-03-22 LAB — HX PHOSPHORUS LEVEL
CASE NUMBER: 2021246000155
HX PHOSPHORUS: 3.2 mg/dL — NL (ref 2.4–4.9)

## 2020-03-22 LAB — HX ALT
CASE NUMBER: 2021246000181
HX ALT: 17 U/L — NL (ref 6.0–55.0)

## 2020-03-22 LAB — HX GLOMERULAR FILTRATION RATE (ESTIMATED)
CASE NUMBER: 2021246000155
HX AFN AMER GLOMERULAR FILTRATION RATE: >90
HX NON-AFN AMER GLOMERULAR FILTRATION RATE: >90

## 2020-03-22 LAB — HX BILIRUBIN TOTAL
CASE NUMBER: 2021246000181
HX BILIRUBIN TOTAL: 0.2 mg/dL — NL (ref 0.2–1.2)

## 2020-03-23 LAB — HX .AUTOMATED DIFF
CASE NUMBER: 2021247000188
HX ABSOLUTE BASO COUNT: 0.09 10*3/uL — NL (ref 0.0–0.22)
HX ABSOLUTE EOS COUNT: 0.71 10*3/uL — ABNORMAL HIGH (ref 0.0–0.45)
HX ABSOLUTE LYMPHS COUNT: 1.95 10*3/uL — NL (ref 0.74–5.04)
HX ABSOLUTE MONO COUNT: 1.57 10*3/uL — ABNORMAL HIGH (ref 0.0–1.34)
HX ABSOLUTE NEUTRO COUNT: 11.1 10*3/uL — ABNORMAL HIGH (ref 1.48–7.95)
HX BASOPHILS: 0.6 %
HX EOSINOPHILS: 4.4 %
HX IMMATURE GRANULOCYTES: 5.5 % — ABNORMAL HIGH (ref 0.0–2.0)
HX LYMPHOCYTES: 11.9 %
HX MONOCYTES: 9.6 %
HX NEUTROPHILS: 68 %

## 2020-03-23 LAB — HX CBC W/ DIFF
CASE NUMBER: 2021247000188
HX ABSOLUTE NRBC COUNT: 0.02 10*3/uL
HX HCT: 29 % — ABNORMAL LOW (ref 36.0–47.0)
HX HGB: 8.4 g/dL — ABNORMAL LOW (ref 11.8–16.0)
HX MCH: 21 pg — ABNORMAL LOW (ref 26.0–34.0)
HX MCHC: 29 g/dL — ABNORMAL LOW (ref 31.0–37.0)
HX MCV: 72.5 fL — ABNORMAL LOW (ref 80.0–100.0)
HX MPV: 11.5 fL — NL (ref 9.4–12.4)
HX NRBC PERCENT: 0.1 % — ABNORMAL HIGH
HX PLATELET: 489 10*3/uL — ABNORMAL HIGH (ref 150.0–400.0)
HX RBC: 4 10*6/uL — NL (ref 3.9–5.2)
HX RDW-CV: 19.1 % — ABNORMAL HIGH (ref 11.5–14.5)
HX RDW-SD: 49 fL — NL (ref 35.0–51.0)
HX WBC: 16.3 10*3/uL — ABNORMAL HIGH (ref 3.7–11.2)

## 2020-03-23 LAB — HX COMPREHENSIVE METABOLIC PANEL
CASE NUMBER: 2021247000159
HX ALBUMIN LVL: 2.4 g/dL — ABNORMAL LOW (ref 3.2–5.0)
HX ALKALINE PHOSPHATASE: 162 U/L — ABNORMAL HIGH (ref 30.0–117.0)
HX ALT: 26 U/L — NL (ref 6.0–55.0)
HX ANION GAP: 7 — NL (ref 3.0–11.0)
HX AST: 23 U/L — NL (ref 6.0–40.0)
HX BILIRUBIN TOTAL: 0.2 mg/dL — NL (ref 0.2–1.2)
HX BUN: 21 mg/dL — NL (ref 8.0–23.0)
HX CALCIUM LVL: 8.8 mg/dL — NL (ref 8.5–10.5)
HX CHLORIDE: 100 mmol/L — NL (ref 98.0–110.0)
HX CO2: 26 mmol/L — NL (ref 21.0–32.0)
HX CREATININE: 0.485 mg/dL — ABNORMAL LOW (ref 0.55–1.3)
HX GLUCOSE LVL: 86 mg/dL — NL (ref 70.0–110.0)
HX POTASSIUM LVL: 5.2 mmol/L — NL (ref 3.6–5.2)
HX SODIUM LVL: 133 mmol/L — ABNORMAL LOW (ref 136.0–146.0)
HX TOTAL PROTEIN: 6 g/dL — NL (ref 6.0–8.4)

## 2020-03-23 LAB — HX PHOSPHORUS LEVEL
CASE NUMBER: 2021247000159
HX PHOSPHORUS: 4.4 mg/dL — NL (ref 2.4–4.9)

## 2020-03-23 LAB — HX GLOMERULAR FILTRATION RATE (ESTIMATED)
CASE NUMBER: 2021247000159
HX AFN AMER GLOMERULAR FILTRATION RATE: >90
HX NON-AFN AMER GLOMERULAR FILTRATION RATE: >90

## 2020-03-23 LAB — HX MAGNESIUM LEVEL
CASE NUMBER: 2021247000159
HX MAGNESIUM LVL: 2.4 mg/dL — NL (ref 1.7–2.5)

## 2020-03-24 LAB — HX PHOSPHORUS LEVEL
CASE NUMBER: 2021248000160
HX PHOSPHORUS: 3.7 mg/dL — NL (ref 2.4–4.9)

## 2020-03-24 LAB — HX CBC W/ DIFF
CASE NUMBER: 2021248000187
HX ABSOLUTE NRBC COUNT: 0 10*3/uL
HX HCT: 27.1 % — ABNORMAL LOW (ref 36.0–47.0)
HX HGB: 8.1 g/dL — ABNORMAL LOW (ref 11.8–16.0)
HX MCH: 21.5 pg — ABNORMAL LOW (ref 26.0–34.0)
HX MCHC: 29.9 g/dL — ABNORMAL LOW (ref 31.0–37.0)
HX MCV: 71.9 fL — ABNORMAL LOW (ref 80.0–100.0)
HX MPV: 11.2 fL — NL (ref 9.4–12.4)
HX NRBC PERCENT: 0 % — NL
HX PLATELET: 447 10*3/uL — ABNORMAL HIGH (ref 150.0–400.0)
HX RBC: 3.77 10*6/uL — ABNORMAL LOW (ref 3.9–5.2)
HX RDW-CV: 18.9 % — ABNORMAL HIGH (ref 11.5–14.5)
HX RDW-SD: 48.3 fL — NL (ref 35.0–51.0)
HX WBC: 13.6 10*3/uL — ABNORMAL HIGH (ref 3.7–11.2)

## 2020-03-24 LAB — HX .MANUAL DIFF
CASE NUMBER: 2021248000187
HX ABSOLUTE BASO COUNT MANUAL: 0 10*3/uL — NL (ref 0.0–0.22)
HX ABSOLUTE EOS COUNT MANUAL: 1.08 10*3/uL — ABNORMAL HIGH (ref 0.0–0.45)
HX ABSOLUTE LYMPHS COUNT MANUAL: 0.95 10*3/uL — NL (ref 0.74–5.04)
HX ABSOLUTE MONO COUNT MANUAL: 1.49 10*3/uL — ABNORMAL HIGH (ref 0.0–1.34)
HX ABSOLUTE NEUTRO COUNT MANUAL: 9.22 10*3/uL — ABNORMAL HIGH (ref 1.48–7.95)
HX BASOS MANUAL: 0 %
HX EOS MANUAL: 8 %
HX LYMPHS MANUAL: 7 %
HX METAS MANUAL: 2 % — ABNORMAL HIGH (ref 0.0–1.0)
HX MONOS MANUAL: 11 %
HX MYELOS MANUAL: 4 % — ABNORMAL HIGH
HX NEUTROPHILS MANUAL: 68 %
HX REACTIVE LYMPHS MANUAL: 0 %

## 2020-03-24 LAB — HX GLOMERULAR FILTRATION RATE (ESTIMATED)
CASE NUMBER: 2021248000160
HX AFN AMER GLOMERULAR FILTRATION RATE: >90
HX NON-AFN AMER GLOMERULAR FILTRATION RATE: >90

## 2020-03-24 LAB — HX COMPREHENSIVE METABOLIC PANEL
CASE NUMBER: 2021248000160
HX ALBUMIN LVL: 2.4 g/dL — ABNORMAL LOW (ref 3.2–5.0)
HX ALKALINE PHOSPHATASE: 159 U/L — ABNORMAL HIGH (ref 30.0–117.0)
HX ALT: 33 U/L — NL (ref 6.0–55.0)
HX ANION GAP: 9 — NL (ref 3.0–11.0)
HX AST: 31 U/L — NL (ref 6.0–40.0)
HX BILIRUBIN TOTAL: 0.2 mg/dL — NL (ref 0.2–1.2)
HX BUN: 21 mg/dL — NL (ref 8.0–23.0)
HX CALCIUM LVL: 8.6 mg/dL — NL (ref 8.5–10.5)
HX CHLORIDE: 102 mmol/L — NL (ref 98.0–110.0)
HX CO2: 25 mmol/L — NL (ref 21.0–32.0)
HX CREATININE: 0.447 mg/dL — ABNORMAL LOW (ref 0.55–1.3)
HX GLUCOSE LVL: 125 mg/dL — ABNORMAL HIGH (ref 70.0–110.0)
HX POTASSIUM LVL: 4.1 mmol/L — NL (ref 3.6–5.2)
HX SODIUM LVL: 136 mmol/L — NL (ref 136.0–146.0)
HX TOTAL PROTEIN: 5.9 g/dL — ABNORMAL LOW (ref 6.0–8.4)

## 2020-03-24 LAB — HX MAGNESIUM LEVEL
CASE NUMBER: 2021248000160
HX MAGNESIUM LVL: 2.1 mg/dL — NL (ref 1.7–2.5)

## 2020-03-25 LAB — HX CBC W/ DIFF
CASE NUMBER: 2021249000151
HX ABSOLUTE NRBC COUNT: 0.02 10*3/uL
HX HCT: 23.4 % — ABNORMAL LOW (ref 36.0–47.0)
HX HGB: 7.1 g/dL — ABNORMAL LOW (ref 11.8–16.0)
HX MCH: 21.8 pg — ABNORMAL LOW (ref 26.0–34.0)
HX MCHC: 30.3 g/dL — ABNORMAL LOW (ref 31.0–37.0)
HX MCV: 71.8 fL — ABNORMAL LOW (ref 80.0–100.0)
HX MPV: 11.6 fL — NL (ref 9.4–12.4)
HX NRBC PERCENT: 0.2 % — ABNORMAL HIGH
HX PLATELET: 380 10*3/uL — NL (ref 150.0–400.0)
HX RBC: 3.26 10*6/uL — ABNORMAL LOW (ref 3.9–5.2)
HX RDW-CV: 18.7 % — ABNORMAL HIGH (ref 11.5–14.5)
HX RDW-SD: 48.5 fL — NL (ref 35.0–51.0)
HX WBC: 11 10*3/uL — NL (ref 3.7–11.2)

## 2020-03-25 LAB — HX COMPREHENSIVE METABOLIC PANEL
CASE NUMBER: 2021249000119
HX ALBUMIN LVL: 2.3 g/dL — ABNORMAL LOW (ref 3.2–5.0)
HX ALKALINE PHOSPHATASE: 150 U/L — ABNORMAL HIGH (ref 30.0–117.0)
HX ALT: 38 U/L — NL (ref 6.0–55.0)
HX ANION GAP: 5 — NL (ref 3.0–11.0)
HX AST: 33 U/L — NL (ref 6.0–40.0)
HX BILIRUBIN TOTAL: 0.2 mg/dL — NL (ref 0.2–1.2)
HX BUN: 18 mg/dL — NL (ref 8.0–23.0)
HX CALCIUM LVL: 8.2 mg/dL — ABNORMAL LOW (ref 8.5–10.5)
HX CHLORIDE: 104 mmol/L — NL (ref 98.0–110.0)
HX CO2: 29 mmol/L — NL (ref 21.0–32.0)
HX CREATININE: 0.405 mg/dL — ABNORMAL LOW (ref 0.55–1.3)
HX GLUCOSE LVL: 100 mg/dL — NL (ref 70.0–110.0)
HX POTASSIUM LVL: 3.5 mmol/L — ABNORMAL LOW (ref 3.6–5.2)
HX SODIUM LVL: 138 mmol/L — NL (ref 136.0–146.0)
HX TOTAL PROTEIN: 5.3 g/dL — ABNORMAL LOW (ref 6.0–8.4)

## 2020-03-25 LAB — HX .AUTOMATED DIFF
CASE NUMBER: 2021249000151
HX ABSOLUTE BASO COUNT: 0.02 10*3/uL — NL (ref 0.0–0.22)
HX ABSOLUTE EOS COUNT: 0.58 10*3/uL — ABNORMAL HIGH (ref 0.0–0.45)
HX ABSOLUTE LYMPHS COUNT: 1.56 10*3/uL — NL (ref 0.74–5.04)
HX ABSOLUTE MONO COUNT: 1.25 10*3/uL — NL (ref 0.0–1.34)
HX ABSOLUTE NEUTRO COUNT: 6.97 10*3/uL — NL (ref 1.48–7.95)
HX BASOPHILS: 0.2 %
HX EOSINOPHILS: 5.3 %
HX IMMATURE GRANULOCYTES: 5.4 % — ABNORMAL HIGH (ref 0.0–2.0)
HX LYMPHOCYTES: 14.2 %
HX MONOCYTES: 11.4 %
HX NEUTROPHILS: 63.5 %

## 2020-03-25 LAB — HX GLOMERULAR FILTRATION RATE (ESTIMATED)
CASE NUMBER: 2021249000119
HX AFN AMER GLOMERULAR FILTRATION RATE: >90
HX NON-AFN AMER GLOMERULAR FILTRATION RATE: >90

## 2020-03-25 LAB — HX PHOSPHORUS LEVEL
CASE NUMBER: 2021249000119
HX PHOSPHORUS: 3.6 mg/dL — NL (ref 2.4–4.9)

## 2020-03-25 LAB — HX MAGNESIUM LEVEL
CASE NUMBER: 2021249000119
HX MAGNESIUM LVL: 1.8 mg/dL — NL (ref 1.7–2.5)

## 2020-03-26 LAB — HX CBC W/ DIFF
CASE NUMBER: 2021250000161
HX ABSOLUTE NRBC COUNT: 0.02 10*3/uL
HX HCT: 24.2 % — ABNORMAL LOW (ref 36.0–47.0)
HX HGB: 7.2 g/dL — ABNORMAL LOW (ref 11.8–16.0)
HX MCH: 21.5 pg — ABNORMAL LOW (ref 26.0–34.0)
HX MCHC: 29.8 g/dL — ABNORMAL LOW (ref 31.0–37.0)
HX MCV: 72.2 fL — ABNORMAL LOW (ref 80.0–100.0)
HX MPV: 11.6 fL — NL (ref 9.4–12.4)
HX NRBC PERCENT: 0.2 % — ABNORMAL HIGH
HX PLATELET: 381 10*3/uL — NL (ref 150.0–400.0)
HX RBC: 3.35 10*6/uL — ABNORMAL LOW (ref 3.9–5.2)
HX RDW-CV: 19.3 % — ABNORMAL HIGH (ref 11.5–14.5)
HX RDW-SD: 49.3 fL — NL (ref 35.0–51.0)
HX WBC: 9.7 10*3/uL — NL (ref 3.7–11.2)

## 2020-03-26 LAB — HX COMPREHENSIVE METABOLIC PANEL
CASE NUMBER: 2021250000134
HX ALBUMIN LVL: 2.5 g/dL — ABNORMAL LOW (ref 3.2–5.0)
HX ALKALINE PHOSPHATASE: 155 U/L — ABNORMAL HIGH (ref 30.0–117.0)
HX ALT: 39 U/L — NL (ref 6.0–55.0)
HX ANION GAP: 7 — NL (ref 3.0–11.0)
HX AST: 30 U/L — NL (ref 6.0–40.0)
HX BILIRUBIN TOTAL: 0.2 mg/dL — NL (ref 0.2–1.2)
HX BUN: 18 mg/dL — NL (ref 8.0–23.0)
HX CALCIUM LVL: 8.2 mg/dL — ABNORMAL LOW (ref 8.5–10.5)
HX CHLORIDE: 105 mmol/L — NL (ref 98.0–110.0)
HX CO2: 27 mmol/L — NL (ref 21.0–32.0)
HX CREATININE: 0.39 mg/dL — ABNORMAL LOW (ref 0.55–1.3)
HX GLUCOSE LVL: 96 mg/dL — NL (ref 70.0–110.0)
HX POTASSIUM LVL: 3.8 mmol/L — NL (ref 3.6–5.2)
HX SODIUM LVL: 139 mmol/L — NL (ref 136.0–146.0)
HX TOTAL PROTEIN: 5.6 g/dL — ABNORMAL LOW (ref 6.0–8.4)

## 2020-03-26 LAB — HX GLOMERULAR FILTRATION RATE (ESTIMATED)
CASE NUMBER: 2021250000134
HX AFN AMER GLOMERULAR FILTRATION RATE: >90
HX NON-AFN AMER GLOMERULAR FILTRATION RATE: >90

## 2020-03-26 LAB — HX .AUTOMATED DIFF
CASE NUMBER: 2021250000161
HX ABSOLUTE BASO COUNT: 0.03 10*3/uL — NL (ref 0.0–0.22)
HX ABSOLUTE EOS COUNT: 0.67 10*3/uL — ABNORMAL HIGH (ref 0.0–0.45)
HX ABSOLUTE LYMPHS COUNT: 1.75 10*3/uL — NL (ref 0.74–5.04)
HX ABSOLUTE MONO COUNT: 1.11 10*3/uL — NL (ref 0.0–1.34)
HX ABSOLUTE NEUTRO COUNT: 5.8 10*3/uL — NL (ref 1.48–7.95)
HX BASOPHILS: 0.3 %
HX EOSINOPHILS: 6.9 %
HX IMMATURE GRANULOCYTES: 3.5 % — ABNORMAL HIGH (ref 0.0–2.0)
HX LYMPHOCYTES: 18 %
HX MONOCYTES: 11.4 %
HX NEUTROPHILS: 59.9 %

## 2020-03-26 LAB — HX MAGNESIUM LEVEL
CASE NUMBER: 2021250000134
HX MAGNESIUM LVL: 2.1 mg/dL — NL (ref 1.7–2.5)

## 2020-03-26 LAB — HX PHOSPHORUS LEVEL
CASE NUMBER: 2021250000134
HX PHOSPHORUS: 3.5 mg/dL — NL (ref 2.4–4.9)

## 2020-03-27 LAB — HX CBC W/ INDICES
CASE NUMBER: 2021251000180
CASE NUMBER: 2021251000787
HX ABSOLUTE NRBC COUNT: 0 10*3/uL
HX ABSOLUTE NRBC COUNT: 0 10*3/uL
HX HCT: 22.2 % — ABNORMAL LOW (ref 36.0–47.0)
HX HCT: 20.2 % — CR (ref 36.0–47.0)
HX HGB: 6.6 g/dL — CR (ref 11.8–16.0)
HX HGB: 7.2 g/dL — ABNORMAL LOW (ref 11.8–16.0)
HX MCH: 21.5 pg — ABNORMAL LOW (ref 26.0–34.0)
HX MCH: 29.3 pg — NL (ref 26.0–34.0)
HX MCHC: 29.7 g/dL — ABNORMAL LOW (ref 31.0–37.0)
HX MCHC: 35.6 g/dL — NL (ref 31.0–37.0)
HX MCV: 72.3 fL — ABNORMAL LOW (ref 80.0–100.0)
HX MCV: 82.1 fL — NL (ref 80.0–100.0)
HX MPV: 11.6 fL — NL (ref 9.4–12.4)
HX MPV: 12.2 fL — NL (ref 9.4–12.4)
HX NRBC PERCENT: 0 % — NL
HX NRBC PERCENT: 0 % — NL
HX PLATELET: 363 10*3/uL — NL (ref 150.0–400.0)
HX PLATELET: 314 10*3/uL — NL (ref 150.0–400.0)
HX RBC: 3.07 10*6/uL — ABNORMAL LOW (ref 3.9–5.2)
HX RBC: 2.46 10*6/uL — ABNORMAL LOW (ref 3.9–5.2)
HX RDW-CV: 19.7 % — ABNORMAL HIGH (ref 11.5–14.5)
HX RDW-CV: 21.2 % — ABNORMAL HIGH (ref 11.5–14.5)
HX RDW-SD: 50.5 fL — NL (ref 35.0–51.0)
HX RDW-SD: 62.4 fL — ABNORMAL HIGH (ref 35.0–51.0)
HX WBC: 11.6 10*3/uL — ABNORMAL HIGH (ref 3.7–11.2)
HX WBC: 9 10*3/uL — NL (ref 3.7–11.2)

## 2020-03-27 LAB — HX COMPREHENSIVE METABOLIC PANEL
CASE NUMBER: 2021251000149
HX ALBUMIN LVL: 2.3 g/dL — ABNORMAL LOW (ref 3.2–5.0)
HX ALKALINE PHOSPHATASE: 149 U/L — ABNORMAL HIGH (ref 30.0–117.0)
HX ALT: 42 U/L — NL (ref 6.0–55.0)
HX ANION GAP: 6 — NL (ref 3.0–11.0)
HX AST: 29 U/L — NL (ref 6.0–40.0)
HX BILIRUBIN TOTAL: 0.2 mg/dL — NL (ref 0.2–1.2)
HX BUN: 15 mg/dL — NL (ref 8.0–23.0)
HX CALCIUM LVL: 8 mg/dL — ABNORMAL LOW (ref 8.5–10.5)
HX CHLORIDE: 106 mmol/L — NL (ref 98.0–110.0)
HX CO2: 28 mmol/L — NL (ref 21.0–32.0)
HX CREATININE: 0.352 mg/dL — ABNORMAL LOW (ref 0.55–1.3)
HX GLUCOSE LVL: 102 mg/dL — NL (ref 70.0–110.0)
HX POTASSIUM LVL: 4 mmol/L — NL (ref 3.6–5.2)
HX SODIUM LVL: 140 mmol/L — NL (ref 136.0–146.0)
HX TOTAL PROTEIN: 5.1 g/dL — ABNORMAL LOW (ref 6.0–8.4)

## 2020-03-27 LAB — HX GLOMERULAR FILTRATION RATE (ESTIMATED)
CASE NUMBER: 2021251000149
HX AFN AMER GLOMERULAR FILTRATION RATE: >90
HX NON-AFN AMER GLOMERULAR FILTRATION RATE: >90

## 2020-03-27 LAB — HX BILIRUBIN TOTAL
CASE NUMBER: 2021251000181
HX BILIRUBIN TOTAL: 0.2 mg/dL — NL (ref 0.2–1.2)

## 2020-03-27 LAB — HX PHOSPHORUS LEVEL
CASE NUMBER: 2021251000149
HX PHOSPHORUS: 2.9 mg/dL — NL (ref 2.4–4.9)

## 2020-03-27 LAB — HX MAGNESIUM LEVEL
CASE NUMBER: 2021251000149
HX MAGNESIUM LVL: 2.1 mg/dL — NL (ref 1.7–2.5)

## 2020-03-27 LAB — HX ALT
CASE NUMBER: 2021251000181
HX ALT: 41 U/L — NL (ref 6.0–55.0)

## 2020-03-28 LAB — HX LAVENDER TOP TO HOLD: CASE NUMBER: 2021252000163

## 2020-03-28 LAB — HX GLOMERULAR FILTRATION RATE (ESTIMATED)
CASE NUMBER: 2021252000163
HX AFN AMER GLOMERULAR FILTRATION RATE: >90
HX NON-AFN AMER GLOMERULAR FILTRATION RATE: >90

## 2020-03-28 LAB — HX COMPREHENSIVE METABOLIC PANEL
CASE NUMBER: 2021252000163
HX ALBUMIN LVL: 2.4 g/dL — ABNORMAL LOW (ref 3.2–5.0)
HX ALKALINE PHOSPHATASE: 154 U/L — ABNORMAL HIGH (ref 30.0–117.0)
HX ALT: 39 U/L — NL (ref 6.0–55.0)
HX ANION GAP: 5 — NL (ref 3.0–11.0)
HX AST: 25 U/L — NL (ref 6.0–40.0)
HX BILIRUBIN TOTAL: 0.2 mg/dL — NL (ref 0.2–1.2)
HX BUN: 15 mg/dL — NL (ref 8.0–23.0)
HX CALCIUM LVL: 8.1 mg/dL — ABNORMAL LOW (ref 8.5–10.5)
HX CHLORIDE: 104 mmol/L — NL (ref 98.0–110.0)
HX CO2: 27 mmol/L — NL (ref 21.0–32.0)
HX CREATININE: 0.345 mg/dL — ABNORMAL LOW (ref 0.55–1.3)
HX GLUCOSE LVL: 118 mg/dL — ABNORMAL HIGH (ref 70.0–110.0)
HX POTASSIUM LVL: 4.1 mmol/L — NL (ref 3.6–5.2)
HX SODIUM LVL: 136 mmol/L — NL (ref 136.0–146.0)
HX TOTAL PROTEIN: 5.4 g/dL — ABNORMAL LOW (ref 6.0–8.4)

## 2020-03-28 LAB — HX PHOSPHORUS LEVEL
CASE NUMBER: 2021252000163
HX PHOSPHORUS: 2.9 mg/dL — NL (ref 2.4–4.9)

## 2020-03-28 LAB — HX MAGNESIUM LEVEL
CASE NUMBER: 2021252000163
HX MAGNESIUM LVL: 2.4 mg/dL — NL (ref 1.7–2.5)

## 2020-03-29 LAB — HX .AUTOMATED DIFF
CASE NUMBER: 2021253000163
HX ABSOLUTE BASO COUNT: 0.04 10*3/uL — NL (ref 0.0–0.22)
HX ABSOLUTE EOS COUNT: 0.86 10*3/uL — ABNORMAL HIGH (ref 0.0–0.45)
HX ABSOLUTE LYMPHS COUNT: 1.25 10*3/uL — NL (ref 0.74–5.04)
HX ABSOLUTE MONO COUNT: 0.95 10*3/uL — NL (ref 0.0–1.34)
HX ABSOLUTE NEUTRO COUNT: 13.68 10*3/uL — ABNORMAL HIGH (ref 1.48–7.95)
HX BASOPHILS: 0.2 %
HX EOSINOPHILS: 5.1 %
HX IMMATURE GRANULOCYTES: 0.9 % — NL (ref 0.0–2.0)
HX LYMPHOCYTES: 7.4 %
HX MONOCYTES: 5.6 %
HX NEUTROPHILS: 80.8 %

## 2020-03-29 LAB — HX COMPREHENSIVE METABOLIC PANEL
CASE NUMBER: 2021253000163
HX ALBUMIN LVL: 2.6 g/dL — ABNORMAL LOW (ref 3.2–5.0)
HX ALKALINE PHOSPHATASE: 163 U/L — ABNORMAL HIGH (ref 30.0–117.0)
HX ALT: 52 U/L — NL (ref 6.0–55.0)
HX ANION GAP: 5 — NL (ref 3.0–11.0)
HX AST: 32 U/L — NL (ref 6.0–40.0)
HX BILIRUBIN TOTAL: 0.2 mg/dL — NL (ref 0.2–1.2)
HX BUN: 12 mg/dL — NL (ref 8.0–23.0)
HX CALCIUM LVL: 7.8 mg/dL — ABNORMAL LOW (ref 8.5–10.5)
HX CHLORIDE: 103 mmol/L — NL (ref 98.0–110.0)
HX CO2: 28 mmol/L — NL (ref 21.0–32.0)
HX CREATININE: 0.418 mg/dL — ABNORMAL LOW (ref 0.55–1.3)
HX GLUCOSE LVL: 131 mg/dL — ABNORMAL HIGH (ref 70.0–110.0)
HX POTASSIUM LVL: 3.8 mmol/L — NL (ref 3.6–5.2)
HX SODIUM LVL: 136 mmol/L — NL (ref 136.0–146.0)
HX TOTAL PROTEIN: 5.6 g/dL — ABNORMAL LOW (ref 6.0–8.4)

## 2020-03-29 LAB — HX CBC W/ DIFF
CASE NUMBER: 2021253000163
HX ABSOLUTE NRBC COUNT: 0 10*3/uL
HX HCT: 23.3 % — ABNORMAL LOW (ref 36.0–47.0)
HX HGB: 7 g/dL — ABNORMAL LOW (ref 11.8–16.0)
HX MCH: 21.5 pg — ABNORMAL LOW (ref 26.0–34.0)
HX MCHC: 30 g/dL — ABNORMAL LOW (ref 31.0–37.0)
HX MCV: 71.5 fL — ABNORMAL LOW (ref 80.0–100.0)
HX MPV: 11.7 fL — NL (ref 9.4–12.4)
HX NRBC PERCENT: 0 % — NL
HX PLATELET: 401 10*3/uL — ABNORMAL HIGH (ref 150.0–400.0)
HX RBC: 3.26 10*6/uL — ABNORMAL LOW (ref 3.9–5.2)
HX RDW-CV: 19.8 % — ABNORMAL HIGH (ref 11.5–14.5)
HX RDW-SD: 50.3 fL — NL (ref 35.0–51.0)
HX WBC: 16.9 10*3/uL — ABNORMAL HIGH (ref 3.7–11.2)

## 2020-03-29 LAB — HX GLOMERULAR FILTRATION RATE (ESTIMATED)
CASE NUMBER: 2021253000163
HX AFN AMER GLOMERULAR FILTRATION RATE: >90
HX NON-AFN AMER GLOMERULAR FILTRATION RATE: >90

## 2020-03-29 LAB — HX PHOSPHORUS LEVEL
CASE NUMBER: 2021253000163
HX PHOSPHORUS: 2.6 mg/dL — NL (ref 2.4–4.9)

## 2020-03-29 LAB — HX .RBC MORPHOLOGY: CASE NUMBER: 2021253000163

## 2020-03-29 LAB — HX MAGNESIUM LEVEL
CASE NUMBER: 2021253000163
HX MAGNESIUM LVL: 2.2 mg/dL — NL (ref 1.7–2.5)

## 2020-03-30 LAB — HX CBC W/ DIFF
CASE NUMBER: 2021254000154
HX ABSOLUTE NRBC COUNT: 0 10*3/uL
HX HCT: 24.6 % — ABNORMAL LOW (ref 36.0–47.0)
HX HGB: 7.4 g/dL — ABNORMAL LOW (ref 11.8–16.0)
HX MCH: 21.7 pg — ABNORMAL LOW (ref 26.0–34.0)
HX MCHC: 30.1 g/dL — ABNORMAL LOW (ref 31.0–37.0)
HX MCV: 72.1 fL — ABNORMAL LOW (ref 80.0–100.0)
HX MPV: 11.1 fL — NL (ref 9.4–12.4)
HX NRBC PERCENT: 0 % — NL
HX PLATELET: 419 10*3/uL — ABNORMAL HIGH (ref 150.0–400.0)
HX RBC: 3.41 10*6/uL — ABNORMAL LOW (ref 3.9–5.2)
HX RDW-CV: 19.6 % — ABNORMAL HIGH (ref 11.5–14.5)
HX RDW-SD: 50.4 fL — NL (ref 35.0–51.0)
HX WBC: 20.2 10*3/uL — ABNORMAL HIGH (ref 3.7–11.2)

## 2020-03-30 LAB — HX .AUTOMATED DIFF
CASE NUMBER: 2021254000154
HX ABSOLUTE BASO COUNT: 0.05 10*3/uL — NL (ref 0.0–0.22)
HX ABSOLUTE EOS COUNT: 0.14 10*3/uL — NL (ref 0.0–0.45)
HX ABSOLUTE LYMPHS COUNT: 0.89 10*3/uL — NL (ref 0.74–5.04)
HX ABSOLUTE MONO COUNT: 1.1 10*3/uL — NL (ref 0.0–1.34)
HX ABSOLUTE NEUTRO COUNT: 17.87 10*3/uL — ABNORMAL HIGH (ref 1.48–7.95)
HX BASOPHILS: 0.2 %
HX EOSINOPHILS: 0.7 %
HX IMMATURE GRANULOCYTES: 0.8 % — NL (ref 0.0–2.0)
HX LYMPHOCYTES: 4.4 %
HX MONOCYTES: 5.4 %
HX NEUTROPHILS: 88.5 %

## 2020-03-30 LAB — HX .RBC MORPHOLOGY: CASE NUMBER: 2021254000154

## 2020-03-30 LAB — HX COMPREHENSIVE METABOLIC PANEL
CASE NUMBER: 2021254000154
HX ALBUMIN LVL: 2.8 g/dL — ABNORMAL LOW (ref 3.2–5.0)
HX ALKALINE PHOSPHATASE: 158 U/L — ABNORMAL HIGH (ref 30.0–117.0)
HX ALT: 46 U/L — NL (ref 6.0–55.0)
HX ANION GAP: 8 — NL (ref 3.0–11.0)
HX AST: 22 U/L — NL (ref 6.0–40.0)
HX BILIRUBIN TOTAL: 0.3 mg/dL — NL (ref 0.2–1.2)
HX BUN: 17 mg/dL — NL (ref 8.0–23.0)
HX CALCIUM LVL: 8.2 mg/dL — ABNORMAL LOW (ref 8.5–10.5)
HX CHLORIDE: 101 mmol/L — NL (ref 98.0–110.0)
HX CO2: 26 mmol/L — NL (ref 21.0–32.0)
HX CREATININE: 0.413 mg/dL — ABNORMAL LOW (ref 0.55–1.3)
HX GLUCOSE LVL: 109 mg/dL — NL (ref 70.0–110.0)
HX POTASSIUM LVL: 4.6 mmol/L — NL (ref 3.6–5.2)
HX SODIUM LVL: 135 mmol/L — ABNORMAL LOW (ref 136.0–146.0)
HX TOTAL PROTEIN: 6 g/dL — NL (ref 6.0–8.4)

## 2020-03-30 LAB — HX MAGNESIUM LEVEL
CASE NUMBER: 2021254000154
HX MAGNESIUM LVL: 2.6 mg/dL — ABNORMAL HIGH (ref 1.7–2.5)

## 2020-03-30 LAB — HX GLOMERULAR FILTRATION RATE (ESTIMATED)
CASE NUMBER: 2021254000154
HX AFN AMER GLOMERULAR FILTRATION RATE: >90
HX NON-AFN AMER GLOMERULAR FILTRATION RATE: >90

## 2020-03-30 LAB — HX PHOSPHORUS LEVEL
CASE NUMBER: 2021254000154
HX PHOSPHORUS: 2.9 mg/dL — NL (ref 2.4–4.9)

## 2020-03-31 LAB — HX CBC W/ DIFF
CASE NUMBER: 2021255000158
HX ABSOLUTE NRBC COUNT: 0 10*3/uL
HX HCT: 25.2 % — ABNORMAL LOW (ref 36.0–47.0)
HX HGB: 7.6 g/dL — ABNORMAL LOW (ref 11.8–16.0)
HX MCH: 21.5 pg — ABNORMAL LOW (ref 26.0–34.0)
HX MCHC: 30.2 g/dL — ABNORMAL LOW (ref 31.0–37.0)
HX MCV: 71.2 fL — ABNORMAL LOW (ref 80.0–100.0)
HX MPV: 10.6 fL — NL (ref 9.4–12.4)
HX NRBC PERCENT: 0 % — NL
HX PLATELET: 445 10*3/uL — ABNORMAL HIGH (ref 150.0–400.0)
HX RBC: 3.54 10*6/uL — ABNORMAL LOW (ref 3.9–5.2)
HX RDW-CV: 19.5 % — ABNORMAL HIGH (ref 11.5–14.5)
HX RDW-SD: 49.8 fL — NL (ref 35.0–51.0)
HX WBC: 13.5 10*3/uL — ABNORMAL HIGH (ref 3.7–11.2)

## 2020-03-31 LAB — HX .AUTOMATED DIFF
CASE NUMBER: 2021255000158
HX ABSOLUTE BASO COUNT: 0.03 10*3/uL — NL (ref 0.0–0.22)
HX ABSOLUTE EOS COUNT: 0.17 10*3/uL — NL (ref 0.0–0.45)
HX ABSOLUTE LYMPHS COUNT: 1.34 10*3/uL — NL (ref 0.74–5.04)
HX ABSOLUTE MONO COUNT: 1.3 10*3/uL — NL (ref 0.0–1.34)
HX ABSOLUTE NEUTRO COUNT: 10.59 10*3/uL — ABNORMAL HIGH (ref 1.48–7.95)
HX BASOPHILS: 0.2 %
HX EOSINOPHILS: 1.3 %
HX IMMATURE GRANULOCYTES: 0.6 % — NL (ref 0.0–2.0)
HX LYMPHOCYTES: 9.9 %
HX MONOCYTES: 9.6 %
HX NEUTROPHILS: 78.4 %

## 2020-03-31 LAB — HX GLOMERULAR FILTRATION RATE (ESTIMATED)
CASE NUMBER: 2021255000158
HX AFN AMER GLOMERULAR FILTRATION RATE: >90
HX NON-AFN AMER GLOMERULAR FILTRATION RATE: >90

## 2020-03-31 LAB — HX COMPREHENSIVE METABOLIC PANEL
CASE NUMBER: 2021255000158
HX ALBUMIN LVL: 3 g/dL — ABNORMAL LOW (ref 3.2–5.0)
HX ALKALINE PHOSPHATASE: 146 U/L — ABNORMAL HIGH (ref 30.0–117.0)
HX ALT: 39 U/L — NL (ref 6.0–55.0)
HX ANION GAP: 8 — NL (ref 3.0–11.0)
HX AST: 13 U/L — NL (ref 6.0–40.0)
HX BILIRUBIN TOTAL: 0.3 mg/dL — NL (ref 0.2–1.2)
HX BUN: 20 mg/dL — NL (ref 8.0–23.0)
HX CALCIUM LVL: 8.4 mg/dL — ABNORMAL LOW (ref 8.5–10.5)
HX CHLORIDE: 99 mmol/L — NL (ref 98.0–110.0)
HX CO2: 28 mmol/L — NL (ref 21.0–32.0)
HX CREATININE: 0.439 mg/dL — ABNORMAL LOW (ref 0.55–1.3)
HX GLUCOSE LVL: 107 mg/dL — NL (ref 70.0–110.0)
HX POTASSIUM LVL: 4.6 mmol/L — NL (ref 3.6–5.2)
HX SODIUM LVL: 135 mmol/L — ABNORMAL LOW (ref 136.0–146.0)
HX TOTAL PROTEIN: 6.4 g/dL — NL (ref 6.0–8.4)

## 2020-03-31 LAB — HX PHOSPHORUS LEVEL
CASE NUMBER: 2021255000158
HX PHOSPHORUS: 3.2 mg/dL — NL (ref 2.4–4.9)

## 2020-03-31 LAB — HX MAGNESIUM LEVEL
CASE NUMBER: 2021255000158
HX MAGNESIUM LVL: 2.6 mg/dL — ABNORMAL HIGH (ref 1.7–2.5)

## 2020-04-03 LAB — HX CBC W/ INDICES
CASE NUMBER: 2021258000069
HX ABSOLUTE NRBC COUNT: 0 10*3/uL
HX HCT: 27 % — ABNORMAL LOW (ref 36.0–47.0)
HX HGB: 8.1 g/dL — ABNORMAL LOW (ref 11.8–16.0)
HX MCH: 20.8 pg — ABNORMAL LOW (ref 26.0–34.0)
HX MCHC: 30 g/dL — ABNORMAL LOW (ref 31.0–37.0)
HX MCV: 69.4 fL — ABNORMAL LOW (ref 80.0–100.0)
HX MPV: 11.5 fL — NL (ref 9.4–12.4)
HX NRBC PERCENT: 0 % — NL
HX PLATELET: 416 10*3/uL — ABNORMAL HIGH (ref 150.0–400.0)
HX RBC: 3.89 10*6/uL — ABNORMAL LOW (ref 3.9–5.2)
HX RDW-CV: 20 % — ABNORMAL HIGH (ref 11.5–14.5)
HX RDW-SD: 48.6 fL — NL (ref 35.0–51.0)
HX WBC: 9.4 10*3/uL — NL (ref 3.7–11.2)

## 2020-04-03 LAB — HX BASIC METABOLIC PANEL
CASE NUMBER: 2021258000516
HX ANION GAP: 7 — NL (ref 3.0–11.0)
HX BUN: 19 mg/dL — NL (ref 8.0–23.0)
HX CALCIUM LVL: 7.9 mg/dL — ABNORMAL LOW (ref 8.5–10.5)
HX CHLORIDE: 100 mmol/L — NL (ref 98.0–110.0)
HX CO2: 27 mmol/L — NL (ref 21.0–32.0)
HX CREATININE: 0.447 mg/dL — ABNORMAL LOW (ref 0.55–1.3)
HX GLUCOSE LVL: 108 mg/dL — NL (ref 70.0–110.0)
HX POTASSIUM LVL: 4.2 mmol/L — NL (ref 3.6–5.2)
HX SODIUM LVL: 134 mmol/L — ABNORMAL LOW (ref 136.0–146.0)

## 2020-04-03 LAB — HX C-REACTIVE PROTEIN (CRP)
CASE NUMBER: 21658905
HX C-REACTIVE PROTEIN: 0.52 mg/dL — NL (ref 0.0–0.8)

## 2020-04-03 LAB — HX GLOMERULAR FILTRATION RATE (ESTIMATED)
CASE NUMBER: 2021258000516
HX AFN AMER GLOMERULAR FILTRATION RATE: >90
HX NON-AFN AMER GLOMERULAR FILTRATION RATE: >90

## 2020-04-03 LAB — HX MAGNESIUM LEVEL
CASE NUMBER: 2021258000070
HX MAGNESIUM LVL: 2.4 mg/dL — NL (ref 1.7–2.5)

## 2020-04-05 HISTORY — PX: ESOPHAGOGASTRODUODENOSCOPY: SHX1529

## 2020-04-05 LAB — HX SARS COV 2 RNA BY PCR
CASE NUMBER: 2021260000629
HX SARS COV 2 RNA BY PCR: NOT DETECTED

## 2020-04-06 LAB — HX MAGNESIUM LEVEL
CASE NUMBER: 2021261000529
HX MAGNESIUM LVL: 2.2 mg/dL — NL (ref 1.7–2.5)

## 2020-04-06 LAB — HX BASIC METABOLIC PANEL
CASE NUMBER: 2021261000529
HX ANION GAP: 5 — NL (ref 3.0–11.0)
HX BUN: 14 mg/dL — NL (ref 8.0–23.0)
HX CALCIUM LVL: 8.1 mg/dL — ABNORMAL LOW (ref 8.5–10.5)
HX CHLORIDE: 105 mmol/L — NL (ref 98.0–110.0)
HX CO2: 27 mmol/L — NL (ref 21.0–32.0)
HX CREATININE: 0.41 mg/dL — ABNORMAL LOW (ref 0.55–1.3)
HX GLUCOSE LVL: 95 mg/dL — NL (ref 70.0–110.0)
HX POTASSIUM LVL: 4.5 mmol/L — NL (ref 3.6–5.2)
HX SODIUM LVL: 137 mmol/L — NL (ref 136.0–146.0)

## 2020-04-06 LAB — HX .RBC MORPHOLOGY: CASE NUMBER: 2021261000529

## 2020-04-06 LAB — HX CBC W/ DIFF
CASE NUMBER: 2021261000529
HX ABSOLUTE NRBC COUNT: 0 10*3/uL
HX HCT: 23.5 % — ABNORMAL LOW (ref 36.0–47.0)
HX HGB: 6.9 g/dL — CR (ref 11.8–16.0)
HX MCH: 20.8 pg — ABNORMAL LOW (ref 26.0–34.0)
HX MCHC: 29.4 g/dL — ABNORMAL LOW (ref 31.0–37.0)
HX MCV: 71 fL — ABNORMAL LOW (ref 80.0–100.0)
HX MPV: 11.1 fL — NL (ref 9.4–12.4)
HX NRBC PERCENT: 0 % — NL
HX PLATELET: 259 10*3/uL — NL (ref 150.0–400.0)
HX RBC: 3.31 10*6/uL — ABNORMAL LOW (ref 3.9–5.2)
HX RDW-CV: 19.8 % — ABNORMAL HIGH (ref 11.5–14.5)
HX RDW-SD: 50.1 fL — NL (ref 35.0–51.0)
HX WBC: 10.3 10*3/uL — NL (ref 3.7–11.2)

## 2020-04-06 LAB — HX .AUTOMATED DIFF
CASE NUMBER: 2021261000529
HX ABSOLUTE BASO COUNT: 0.03 10*3/uL — NL (ref 0.0–0.22)
HX ABSOLUTE EOS COUNT: 0.4 10*3/uL — NL (ref 0.0–0.45)
HX ABSOLUTE LYMPHS COUNT: 2.29 10*3/uL — NL (ref 0.74–5.04)
HX ABSOLUTE MONO COUNT: 0.82 10*3/uL — NL (ref 0.0–1.34)
HX ABSOLUTE NEUTRO COUNT: 6.73 10*3/uL — NL (ref 1.48–7.95)
HX BASOPHILS: 0.3 %
HX EOSINOPHILS: 3.9 %
HX IMMATURE GRANULOCYTES: 0.3 % — NL (ref 0.0–2.0)
HX LYMPHOCYTES: 22.2 %
HX MONOCYTES: 8 %
HX NEUTROPHILS: 65.3 %

## 2020-04-06 LAB — HX HEMOGLOBIN AND HEMATOCRIT
CASE NUMBER: 2021261000644
HX HCT: 27.9 % — ABNORMAL LOW (ref 36.0–47.0)
HX HGB: 8.1 g/dL — ABNORMAL LOW (ref 11.8–16.0)

## 2020-04-06 LAB — HX GLOMERULAR FILTRATION RATE (ESTIMATED)
CASE NUMBER: 2021261000529
HX AFN AMER GLOMERULAR FILTRATION RATE: >90
HX NON-AFN AMER GLOMERULAR FILTRATION RATE: >90

## 2020-04-07 LAB — HX CBC W/ INDICES
CASE NUMBER: 2021262001628
HX ABSOLUTE NRBC COUNT: 0 10*3/uL
HX HCT: 26.1 % — ABNORMAL LOW (ref 36.0–47.0)
HX HGB: 7.8 g/dL — ABNORMAL LOW (ref 11.8–16.0)
HX MCH: 21.2 pg — ABNORMAL LOW (ref 26.0–34.0)
HX MCHC: 29.9 g/dL — ABNORMAL LOW (ref 31.0–37.0)
HX MCV: 70.9 fL — ABNORMAL LOW (ref 80.0–100.0)
HX MPV: 11.4 fL — NL (ref 9.4–12.4)
HX NRBC PERCENT: 0 % — NL
HX PLATELET: 304 10*3/uL — NL (ref 150.0–400.0)
HX RBC: 3.68 10*6/uL — ABNORMAL LOW (ref 3.9–5.2)
HX RDW-CV: 19.9 % — ABNORMAL HIGH (ref 11.5–14.5)
HX RDW-SD: 49.9 fL — NL (ref 35.0–51.0)
HX WBC: 15.3 10*3/uL — ABNORMAL HIGH (ref 3.7–11.2)

## 2020-04-07 LAB — HX GLOMERULAR FILTRATION RATE (ESTIMATED)
CASE NUMBER: 2021262001638
HX AFN AMER GLOMERULAR FILTRATION RATE: >90
HX NON-AFN AMER GLOMERULAR FILTRATION RATE: >90

## 2020-04-07 LAB — HX TROPONIN I
CASE NUMBER: 2021262001636
HX TROPONIN I, HIGH SENSITIVITY: <6 — NL

## 2020-04-07 LAB — HX TSH
CASE NUMBER: 2021262001638
HX 3RD GEN TSH: 1.71 u[IU]/mL — NL (ref 0.358–3.74)

## 2020-04-07 LAB — HX LACTIC ACID
CASE NUMBER: 2021262001639
HX LACTIC ACID LVL: 0.9 mmol/L — NL (ref 0.4–2.0)

## 2020-04-07 LAB — HX BASIC METABOLIC PANEL
CASE NUMBER: 2021262001638
HX ANION GAP: 6 — NL (ref 3.0–11.0)
HX BUN: 16 mg/dL — NL (ref 8.0–23.0)
HX CALCIUM LVL: 8.5 mg/dL — NL (ref 8.5–10.5)
HX CHLORIDE: 96 mmol/L — ABNORMAL LOW (ref 98.0–110.0)
HX CO2: 27 mmol/L — NL (ref 21.0–32.0)
HX CREATININE: 0.484 mg/dL — ABNORMAL LOW (ref 0.55–1.3)
HX GLUCOSE LVL: 101 mg/dL — NL (ref 70.0–110.0)
HX POTASSIUM LVL: 4.6 mmol/L — NL (ref 3.6–5.2)
HX SODIUM LVL: 129 mmol/L — ABNORMAL LOW (ref 136.0–146.0)

## 2020-04-08 ENCOUNTER — Ambulatory Visit (HOSPITAL_BASED_OUTPATIENT_CLINIC_OR_DEPARTMENT_OTHER): Admitting: Family Medicine

## 2020-04-08 LAB — HX URINALYSIS (COMPLETE)
CASE NUMBER: 2021262001686
HX UA BILIRUBIN: NEGATIVE — NL
HX UA GLUCOSE: NEGATIVE — NL
HX UA KETONES: NEGATIVE — NL
HX UA LEUKOCYTE ESTERASE: 500 WBC/uL — AB
HX UA NITRITE: NEGATIVE — NL
HX UA PH: 5 — NL (ref 5.0–8.0)
HX UA PROTEIN: NEGATIVE — NL
HX UA RBC: 1 /HPF — NL (ref 0.0–2.0)
HX UA SPECIFIC GRAVITY: 1.027 — NL (ref 1.003–1.03)
HX UA SQUAMOUS EPITHELIAL: <1 — NL (ref 0.0–5.0)
HX UA UROBILINOGEN: NEGATIVE — NL
HX UA WBC: 22 /HPF — ABNORMAL HIGH (ref 0.0–5.0)

## 2020-04-08 NOTE — Progress Notes (Signed)
[name], Anaia **DOB:** [birth year] (85 yo F) **Acc No.** [id number] **DOS:**  04/08/2020    ---        [name], [name]**    ------    53 Y old Female, DOB: 25-Jan-1935    [address], [place], [place] [address]    Home: [phone]    Provider: Cheryle Horsfall        * * *    Telephone Encounter    ---    Answered by    Wilfrid Lund    Date: 04/08/2020        Time: 10:15 AM    Caller    [facility] calling    ------            Reason    TOC - appt. 9/29            Message                      pt admitted for bowel obstruction surgery, needs HFU appt, wants to see Dr. Gwendel Hanson in 1 week.  Is being discharged today.  Appt scheduled for 04/17/20 at 12:00pm.                Action Taken                      Wilfrid Lund  04/10/2020 4:47:37 PM > called pt twice, call picked up twice and whoever picked up hung up the call.      Wilfrid Lund  04/16/2020 1:21:50 PM > called pt, no answer, no voicemail                    * * *              * * *        ---        Reason for Appointment    ---      1\. TOC - appt. 9/29    ---      History of Present Illness    ---    _Initial Transitional Care Contact_ :    Date of contact The date of contact regarding the patients discharge from the  hospital was on 04/08/2020. Sources of information The person who gave the  office the information regarding the recent discharge is a hospital employee.  Hospital Discharge Summary Has the office received a copy of the hospital  discharge summary? No. List of recent hospitalizations of ED visits Is the  patients chart up to date with recent hospitalizations and ED visits? No.  Discharged from What hospital or SNF was the patient discharged from? [facility]. Diagnosis/Problems Does the patient have any new  diagnosis or problems? No. Medication changes Were there any medication  changes while the patient was in the hospital? No. Medication list updated Has  the medication list  been updated to reflect the current list of patients'  medication? No. Needs referrals or labs Does the patient need any follow up  referrals or labs ordered? No. Needs follow up appointment Does the patient  need a follow up appointment? Yes, In what time frame does the patient need to  be seen? Within seven (7) days of discharge (highly complex visit). Future  appointment date Date of future appointment with provider 04/17/2020.  Additional information needed and requested Is there any additional  information that  the provider is in need of before they see the patient? No.          * * *          Provider: Cheryle Horsfall 04/08/2020    ---    Note generated by eClinicalWorks EMR/PM Software ([email or web address])

## 2020-04-09 LAB — HX .AUTOMATED DIFF
CASE NUMBER: 2021264000395
HX ABSOLUTE BASO COUNT: 0.01 10*3/uL — NL (ref 0.0–0.22)
HX ABSOLUTE EOS COUNT: 0.43 10*3/uL — NL (ref 0.0–0.45)
HX ABSOLUTE LYMPHS COUNT: 1.15 10*3/uL — NL (ref 0.74–5.04)
HX ABSOLUTE MONO COUNT: 0.67 10*3/uL — NL (ref 0.0–1.34)
HX ABSOLUTE NEUTRO COUNT: 4.96 10*3/uL — NL (ref 1.48–7.95)
HX BASOPHILS: 0.1 %
HX EOSINOPHILS: 5.9 %
HX IMMATURE GRANULOCYTES: 0.6 % — NL (ref 0.0–2.0)
HX LYMPHOCYTES: 15.8 %
HX MONOCYTES: 9.2 %
HX NEUTROPHILS: 68.4 %

## 2020-04-09 LAB — HX GLOMERULAR FILTRATION RATE (ESTIMATED)
CASE NUMBER: 2021264000395
HX AFN AMER GLOMERULAR FILTRATION RATE: >90
HX NON-AFN AMER GLOMERULAR FILTRATION RATE: >90

## 2020-04-09 LAB — HX BASIC METABOLIC PANEL
CASE NUMBER: 2021264000395
HX ANION GAP: 4 — NL (ref 3.0–11.0)
HX BUN: 7 mg/dL — ABNORMAL LOW (ref 8.0–23.0)
HX CALCIUM LVL: 7.7 mg/dL — ABNORMAL LOW (ref 8.5–10.5)
HX CHLORIDE: 112 mmol/L — ABNORMAL HIGH (ref 98.0–110.0)
HX CO2: 26 mmol/L — NL (ref 21.0–32.0)
HX CREATININE: 0.329 mg/dL — ABNORMAL LOW (ref 0.55–1.3)
HX GLUCOSE LVL: 106 mg/dL — NL (ref 70.0–110.0)
HX POTASSIUM LVL: 3.9 mmol/L — NL (ref 3.6–5.2)
HX SODIUM LVL: 142 mmol/L — NL (ref 136.0–146.0)

## 2020-04-09 LAB — HX CBC W/ DIFF
CASE NUMBER: 2021264000395
HX ABSOLUTE NRBC COUNT: 0 10*3/uL
HX HCT: 22.8 % — ABNORMAL LOW (ref 36.0–47.0)
HX HGB: 6.5 g/dL — CR (ref 11.8–16.0)
HX MCH: 20.9 pg — ABNORMAL LOW (ref 26.0–34.0)
HX MCHC: 28.5 g/dL — ABNORMAL LOW (ref 31.0–37.0)
HX MCV: 73.3 fL — ABNORMAL LOW (ref 80.0–100.0)
HX MPV: 11.5 fL — NL (ref 9.4–12.4)
HX NRBC PERCENT: 0 % — NL
HX PLATELET: 258 10*3/uL — NL (ref 150.0–400.0)
HX RBC: 3.11 10*6/uL — ABNORMAL LOW (ref 3.9–5.2)
HX RDW-CV: 20.1 % — ABNORMAL HIGH (ref 11.5–14.5)
HX RDW-SD: 52.8 fL — ABNORMAL HIGH (ref 35.0–51.0)
HX WBC: 7.3 10*3/uL — NL (ref 3.7–11.2)

## 2020-04-09 LAB — HX SST GOLD TUBE TO HOLD: CASE NUMBER: 2021264000717

## 2020-04-09 LAB — HX .RBC MORPHOLOGY: CASE NUMBER: 2021264000395

## 2020-04-09 LAB — HX HEMOGLOBIN AND HEMATOCRIT
CASE NUMBER: 2021264000717
HX HCT: 22.4 % — ABNORMAL LOW (ref 36.0–47.0)
HX HGB: 6.6 g/dL — CR (ref 11.8–16.0)

## 2020-04-09 LAB — HX PHOSPHORUS LEVEL
CASE NUMBER: 2021264000395
HX PHOSPHORUS: 2.4 mg/dL — NL (ref 2.4–4.9)

## 2020-04-09 LAB — HX MAGNESIUM LEVEL
CASE NUMBER: 2021264000395
HX MAGNESIUM LVL: 2 mg/dL — NL (ref 1.7–2.5)

## 2020-04-10 LAB — HX CROSSMATCH: CASE NUMBER: 2021265001122

## 2020-04-10 LAB — HX .REDCELL
CASE NUMBER: 2021265001122
CASE NUMBER: 2021265001866
HX ADDITIONAL UNITS TO CROSSMATCH: 0 — NL
HX ADDITIONAL UNITS TO CROSSMATCH: 0 — NL
HX NUMBER OF UNITS TO TRANSFUSE: 2
HX NUMBER OF UNITS TO TRANSFUSE: 2

## 2020-04-10 LAB — HX GLOMERULAR FILTRATION RATE (ESTIMATED)
CASE NUMBER: 2021265001188
HX AFN AMER GLOMERULAR FILTRATION RATE: >90
HX NON-AFN AMER GLOMERULAR FILTRATION RATE: >90

## 2020-04-10 LAB — HX BASIC METABOLIC PANEL
CASE NUMBER: 2021265001188
HX ANION GAP: 3 — NL (ref 3.0–11.0)
HX BUN: 4 mg/dL — ABNORMAL LOW (ref 8.0–23.0)
HX CALCIUM LVL: 8.2 mg/dL — ABNORMAL LOW (ref 8.5–10.5)
HX CHLORIDE: 106 mmol/L — NL (ref 98.0–110.0)
HX CO2: 31 mmol/L — NL (ref 21.0–32.0)
HX CREATININE: 0.283 mg/dL — ABNORMAL LOW (ref 0.55–1.3)
HX GLUCOSE LVL: 111 mg/dL — ABNORMAL HIGH (ref 70.0–110.0)
HX POTASSIUM LVL: 3.5 mmol/L — ABNORMAL LOW (ref 3.6–5.2)
HX SODIUM LVL: 140 mmol/L — NL (ref 136.0–146.0)

## 2020-04-10 LAB — HX CBC W/ INDICES
CASE NUMBER: 2021265000206
HX ABSOLUTE NRBC COUNT: 0 10*3/uL
HX HCT: 20.9 % — CR (ref 36.0–47.0)
HX HGB: 6 g/dL — CR (ref 11.8–16.0)
HX MCH: 20.6 pg — ABNORMAL LOW (ref 26.0–34.0)
HX MCHC: 28.7 g/dL — ABNORMAL LOW (ref 31.0–37.0)
HX MCV: 71.8 fL — ABNORMAL LOW (ref 80.0–100.0)
HX MPV: 11 fL — NL (ref 9.4–12.4)
HX NRBC PERCENT: 0 % — NL
HX PLATELET: 252 10*3/uL — NL (ref 150.0–400.0)
HX RBC: 2.91 10*6/uL — ABNORMAL LOW (ref 3.9–5.2)
HX RDW-CV: 19.7 % — ABNORMAL HIGH (ref 11.5–14.5)
HX RDW-SD: 51.4 fL — ABNORMAL HIGH (ref 35.0–51.0)
HX WBC: 6.6 10*3/uL — NL (ref 3.7–11.2)

## 2020-04-10 LAB — HX ANTIBODY SCREEN
CASE NUMBER: 2021265001122
HX ANTIBODY SCREEN AUTOMATED: NEGATIVE — NL

## 2020-04-10 LAB — HX PHOSPHORUS LEVEL
CASE NUMBER: 2021265001238
HX PHOSPHORUS: 2.7 mg/dL — NL (ref 2.4–4.9)

## 2020-04-10 LAB — HX ABO/RH TYPE
CASE NUMBER: 2021265001122
HX ABO/RH TYPE: A POS — NL

## 2020-04-10 LAB — HX MAGNESIUM LEVEL
CASE NUMBER: 2021265001188
HX MAGNESIUM LVL: 2.1 mg/dL — NL (ref 1.7–2.5)

## 2020-04-11 LAB — HX PHOSPHORUS LEVEL
CASE NUMBER: 2021266000929
HX PHOSPHORUS: 2.9 mg/dL — NL (ref 2.4–4.9)

## 2020-04-11 LAB — HX BASIC METABOLIC PANEL
CASE NUMBER: 2021266000929
HX ANION GAP: 3 — NL (ref 3.0–11.0)
HX BUN: 7 mg/dL — ABNORMAL LOW (ref 8.0–23.0)
HX CALCIUM LVL: 8.4 mg/dL — ABNORMAL LOW (ref 8.5–10.5)
HX CHLORIDE: 105 mmol/L — NL (ref 98.0–110.0)
HX CO2: 30 mmol/L — NL (ref 21.0–32.0)
HX CREATININE: 0.402 mg/dL — ABNORMAL LOW (ref 0.55–1.3)
HX GLUCOSE LVL: 141 mg/dL — ABNORMAL HIGH (ref 70.0–110.0)
HX POTASSIUM LVL: 5.3 mmol/L — ABNORMAL HIGH (ref 3.6–5.2)
HX SODIUM LVL: 138 mmol/L — NL (ref 136.0–146.0)

## 2020-04-11 LAB — HX MAGNESIUM LEVEL
CASE NUMBER: 2021266000929
HX MAGNESIUM LVL: 2.2 mg/dL — NL (ref 1.7–2.5)

## 2020-04-11 LAB — HX GLOMERULAR FILTRATION RATE (ESTIMATED)
CASE NUMBER: 2021266000929
HX AFN AMER GLOMERULAR FILTRATION RATE: >90
HX NON-AFN AMER GLOMERULAR FILTRATION RATE: >90

## 2020-04-11 LAB — HX CBC W/ INDICES
CASE NUMBER: 2021266000929
HX ABSOLUTE NRBC COUNT: 0 10*3/uL
HX HCT: 29.3 % — ABNORMAL LOW (ref 36.0–47.0)
HX HGB: 8.9 g/dL — ABNORMAL LOW (ref 11.8–16.0)
HX MCH: 22.5 pg — ABNORMAL LOW (ref 26.0–34.0)
HX MCHC: 30.4 g/dL — ABNORMAL LOW (ref 31.0–37.0)
HX MCV: 74.2 fL — ABNORMAL LOW (ref 80.0–100.0)
HX MPV: 10.8 fL — NL (ref 9.4–12.4)
HX NRBC PERCENT: 0 % — NL
HX PLATELET: 341 10*3/uL — NL (ref 150.0–400.0)
HX RBC: 3.95 10*6/uL — NL (ref 3.9–5.2)
HX RDW-CV: 20.5 % — ABNORMAL HIGH (ref 11.5–14.5)
HX RDW-SD: 55 fL — ABNORMAL HIGH (ref 35.0–51.0)
HX WBC: 7.9 10*3/uL — NL (ref 3.7–11.2)

## 2020-04-12 LAB — HX BASIC METABOLIC PANEL
CASE NUMBER: 2021267000264
HX ANION GAP: 8 — NL (ref 3.0–11.0)
HX BUN: 15 mg/dL — NL (ref 8.0–23.0)
HX CALCIUM LVL: 8.3 mg/dL — ABNORMAL LOW (ref 8.5–10.5)
HX CHLORIDE: 106 mmol/L — NL (ref 98.0–110.0)
HX CO2: 25 mmol/L — NL (ref 21.0–32.0)
HX CREATININE: 0.367 mg/dL — ABNORMAL LOW (ref 0.55–1.3)
HX GLUCOSE LVL: 101 mg/dL — NL (ref 70.0–110.0)
HX POTASSIUM LVL: 4.4 mmol/L — NL (ref 3.6–5.2)
HX SODIUM LVL: 139 mmol/L — NL (ref 136.0–146.0)

## 2020-04-12 LAB — HX LAVENDER TOP TO HOLD: CASE NUMBER: 2021267000264

## 2020-04-12 LAB — HX GLOMERULAR FILTRATION RATE (ESTIMATED)
CASE NUMBER: 2021267000264
HX AFN AMER GLOMERULAR FILTRATION RATE: >90
HX NON-AFN AMER GLOMERULAR FILTRATION RATE: >90

## 2020-04-12 LAB — HX MAGNESIUM LEVEL
CASE NUMBER: 2021267000264
HX MAGNESIUM LVL: 1.9 mg/dL — NL (ref 1.7–2.5)

## 2020-04-13 ENCOUNTER — Ambulatory Visit

## 2020-04-13 LAB — HX PHOSPHORUS LEVEL
CASE NUMBER: 2021268001128
HX PHOSPHORUS: 3.4 mg/dL — NL (ref 2.4–4.9)

## 2020-04-13 LAB — HX BLOOD CULTURE
CASE NUMBER: 2021262001689
CASE NUMBER: 2021262001690
HX F: NO GROWTH
HX F: NO GROWTH

## 2020-04-13 LAB — HX CBC W/ DIFF
CASE NUMBER: 2021268001125
HX ABSOLUTE NRBC COUNT: 0 10*3/uL
HX HCT: 30.3 % — ABNORMAL LOW (ref 36.0–47.0)
HX HGB: 9.1 g/dL — ABNORMAL LOW (ref 11.8–16.0)
HX MCH: 22.2 pg — ABNORMAL LOW (ref 26.0–34.0)
HX MCHC: 30 g/dL — ABNORMAL LOW (ref 31.0–37.0)
HX MCV: 74.1 fL — ABNORMAL LOW (ref 80.0–100.0)
HX MPV: 10.4 fL — NL (ref 9.4–12.4)
HX NRBC PERCENT: 0 % — NL
HX PLATELET: 374 10*3/uL — NL (ref 150.0–400.0)
HX RBC: 4.09 10*6/uL — NL (ref 3.9–5.2)
HX RDW-CV: 21.4 % — ABNORMAL HIGH (ref 11.5–14.5)
HX RDW-SD: 57.1 fL — ABNORMAL HIGH (ref 35.0–51.0)
HX WBC: 11.7 10*3/uL — ABNORMAL HIGH (ref 3.7–11.2)

## 2020-04-13 LAB — HX .RBC MORPHOLOGY: CASE NUMBER: 2021268001125

## 2020-04-13 LAB — HX .AUTOMATED DIFF
CASE NUMBER: 2021268001125
HX ABSOLUTE BASO COUNT: 0.03 10*3/uL — NL (ref 0.0–0.22)
HX ABSOLUTE EOS COUNT: 0.49 10*3/uL — ABNORMAL HIGH (ref 0.0–0.45)
HX ABSOLUTE LYMPHS COUNT: 1.51 10*3/uL — NL (ref 0.74–5.04)
HX ABSOLUTE MONO COUNT: 1.18 10*3/uL — NL (ref 0.0–1.34)
HX ABSOLUTE NEUTRO COUNT: 8.37 10*3/uL — ABNORMAL HIGH (ref 1.48–7.95)
HX BASOPHILS: 0.3 %
HX EOSINOPHILS: 4.2 %
HX IMMATURE GRANULOCYTES: 0.9 % — NL (ref 0.0–2.0)
HX LYMPHOCYTES: 12.9 %
HX MONOCYTES: 10.1 %
HX NEUTROPHILS: 71.6 %

## 2020-04-13 LAB — HX GLOMERULAR FILTRATION RATE (ESTIMATED)
CASE NUMBER: 2021268001128
HX AFN AMER GLOMERULAR FILTRATION RATE: >90
HX NON-AFN AMER GLOMERULAR FILTRATION RATE: >90

## 2020-04-13 LAB — HX BASIC METABOLIC PANEL
CASE NUMBER: 2021268001128
HX ANION GAP: 7 — NL (ref 3.0–11.0)
HX BUN: 20 mg/dL — NL (ref 8.0–23.0)
HX CALCIUM LVL: 8.7 mg/dL — NL (ref 8.5–10.5)
HX CHLORIDE: 103 mmol/L — NL (ref 98.0–110.0)
HX CO2: 25 mmol/L — NL (ref 21.0–32.0)
HX CREATININE: 0.41 mg/dL — ABNORMAL LOW (ref 0.55–1.3)
HX GLUCOSE LVL: 89 mg/dL — NL (ref 70.0–110.0)
HX POTASSIUM LVL: 3.9 mmol/L — NL (ref 3.6–5.2)
HX SODIUM LVL: 135 mmol/L — ABNORMAL LOW (ref 136.0–146.0)

## 2020-04-13 LAB — HX MAGNESIUM LEVEL
CASE NUMBER: 2021268001128
HX MAGNESIUM LVL: 1.9 mg/dL — NL (ref 1.7–2.5)

## 2020-04-14 LAB — HX BASIC METABOLIC PANEL
CASE NUMBER: 2021269000221
HX ANION GAP: 9 — NL (ref 3.0–11.0)
HX BUN: 14 mg/dL — NL (ref 8.0–23.0)
HX CALCIUM LVL: 8.9 mg/dL — NL (ref 8.5–10.5)
HX CHLORIDE: 103 mmol/L — NL (ref 98.0–110.0)
HX CO2: 24 mmol/L — NL (ref 21.0–32.0)
HX CREATININE: 0.468 mg/dL — ABNORMAL LOW (ref 0.55–1.3)
HX GLUCOSE LVL: 99 mg/dL — NL (ref 70.0–110.0)
HX POTASSIUM LVL: 3.8 mmol/L — NL (ref 3.6–5.2)
HX SODIUM LVL: 136 mmol/L — NL (ref 136.0–146.0)

## 2020-04-14 LAB — HX CBC W/ INDICES
CASE NUMBER: 2021269000221
HX ABSOLUTE NRBC COUNT: 0 10*3/uL
HX HCT: 28.1 % — ABNORMAL LOW (ref 36.0–47.0)
HX HGB: 8.5 g/dL — ABNORMAL LOW (ref 11.8–16.0)
HX MCH: 22.4 pg — ABNORMAL LOW (ref 26.0–34.0)
HX MCHC: 30.2 g/dL — ABNORMAL LOW (ref 31.0–37.0)
HX MCV: 74.1 fL — ABNORMAL LOW (ref 80.0–100.0)
HX MPV: 10.9 fL — NL (ref 9.4–12.4)
HX NRBC PERCENT: 0 % — NL
HX PLATELET: 376 10*3/uL — NL (ref 150.0–400.0)
HX RBC: 3.79 10*6/uL — ABNORMAL LOW (ref 3.9–5.2)
HX RDW-CV: 21.7 % — ABNORMAL HIGH (ref 11.5–14.5)
HX RDW-SD: 57.8 fL — ABNORMAL HIGH (ref 35.0–51.0)
HX WBC: 17.8 10*3/uL — ABNORMAL HIGH (ref 3.7–11.2)

## 2020-04-14 LAB — HX PHOSPHORUS LEVEL
CASE NUMBER: 21715073
HX PHOSPHORUS: 3.3 mg/dL — NL (ref 2.4–4.9)

## 2020-04-14 LAB — HX GLOMERULAR FILTRATION RATE (ESTIMATED)
CASE NUMBER: 2021269000221
HX AFN AMER GLOMERULAR FILTRATION RATE: >90
HX NON-AFN AMER GLOMERULAR FILTRATION RATE: >90

## 2020-04-14 LAB — HX COVID19 BY PCR (LGH)
CASE NUMBER: 2021268002250
HX COVID19 BY PCR: NOT DETECTED

## 2020-04-14 LAB — HX MAGNESIUM LEVEL
CASE NUMBER: 21715073
HX MAGNESIUM LVL: 1.8 mg/dL — NL (ref 1.7–2.5)

## 2020-04-15 ENCOUNTER — Ambulatory Visit (HOSPITAL_BASED_OUTPATIENT_CLINIC_OR_DEPARTMENT_OTHER): Admitting: Family Medicine

## 2020-04-15 LAB — HX MAGNESIUM LEVEL
CASE NUMBER: 21719668
HX MAGNESIUM LVL: 2.4 mg/dL — NL (ref 1.7–2.5)

## 2020-04-15 LAB — HX LAVENDER TOP TO HOLD: CASE NUMBER: 2021270001822

## 2020-04-15 LAB — HX BASIC METABOLIC PANEL
CASE NUMBER: 2021270001822
HX ANION GAP: 6 — NL (ref 3.0–11.0)
HX BUN: 16 mg/dL — NL (ref 8.0–23.0)
HX CALCIUM LVL: 8.3 mg/dL — ABNORMAL LOW (ref 8.5–10.5)
HX CHLORIDE: 103 mmol/L — NL (ref 98.0–110.0)
HX CO2: 28 mmol/L — NL (ref 21.0–32.0)
HX CREATININE: 0.372 mg/dL — ABNORMAL LOW (ref 0.55–1.3)
HX GLUCOSE LVL: 115 mg/dL — ABNORMAL HIGH (ref 70.0–110.0)
HX POTASSIUM LVL: 3.8 mmol/L — NL (ref 3.6–5.2)
HX SODIUM LVL: 137 mmol/L — NL (ref 136.0–146.0)

## 2020-04-15 LAB — HX PHOSPHORUS LEVEL
CASE NUMBER: 21719675
HX PHOSPHORUS: 3.2 mg/dL — NL (ref 2.4–4.9)

## 2020-04-15 LAB — HX SST GOLD TUBE TO HOLD: CASE NUMBER: 2021270001822

## 2020-04-15 LAB — HX GLOMERULAR FILTRATION RATE (ESTIMATED)
CASE NUMBER: 2021270001822
HX AFN AMER GLOMERULAR FILTRATION RATE: >90
HX NON-AFN AMER GLOMERULAR FILTRATION RATE: >90

## 2020-04-15 NOTE — Progress Notes (Signed)
* * *      [name], [name] **DOB:** 21-Jul-1934 (85 yo F) **Acc No.** [id number] **DOS:**  04/15/2020    ---        [name], [name]**    ------    62 Y old Female, DOB: [birth year]    [address] [place], [place] [address]    Home: [phone]    Provider: Cheryle Horsfall        * * *    Telephone Encounter    ---    Answered by    Alben Deeds    Date: 04/15/2020        Time: 09:51 AM    Reason    fyi    ------            Message                      pt daughter called to cancel appt for 09/29 for the HOS follow up. says she has been at the hospital for 7 weeks already and they dont know when she will be out. sending as [employer], LPN ,Paulette  03/03/9469 9:57:02 AM > ok noted                    * * *                ---          * * *          Provider: Cheryle Horsfall 04/15/2020    ---    Note generated by eClinicalWorks EMR/PM Software ([email or web address])

## 2020-04-16 LAB — HX CBC W/ INDICES
CASE NUMBER: 2021271000513
HX ABSOLUTE NRBC COUNT: 0 10*3/uL
HX HCT: 26.5 % — ABNORMAL LOW (ref 36.0–47.0)
HX HGB: 8 g/dL — ABNORMAL LOW (ref 11.8–16.0)
HX MCH: 22.2 pg — ABNORMAL LOW (ref 26.0–34.0)
HX MCHC: 30.2 g/dL — ABNORMAL LOW (ref 31.0–37.0)
HX MCV: 73.6 fL — ABNORMAL LOW (ref 80.0–100.0)
HX MPV: 10.7 fL — NL (ref 9.4–12.4)
HX NRBC PERCENT: 0 % — NL
HX PLATELET: 365 10*3/uL — NL (ref 150.0–400.0)
HX RBC: 3.6 10*6/uL — ABNORMAL LOW (ref 3.9–5.2)
HX RDW-CV: 21.3 % — ABNORMAL HIGH (ref 11.5–14.5)
HX RDW-SD: 56.7 fL — ABNORMAL HIGH (ref 35.0–51.0)
HX WBC: 10.3 10*3/uL — NL (ref 3.7–11.2)

## 2020-04-16 LAB — HX GLOMERULAR FILTRATION RATE (ESTIMATED)
CASE NUMBER: 2021271000169
HX AFN AMER GLOMERULAR FILTRATION RATE: >90
HX NON-AFN AMER GLOMERULAR FILTRATION RATE: >90

## 2020-04-16 LAB — HX COMPREHENSIVE METABOLIC PANEL
CASE NUMBER: 21722353
HX ALBUMIN LVL: 2.4 g/dL — ABNORMAL LOW (ref 3.2–5.0)
HX ALKALINE PHOSPHATASE: 112 U/L — NL (ref 30.0–117.0)
HX ALT: 28 U/L — NL (ref 6.0–55.0)
HX ANION GAP: 5 — NL (ref 3.0–11.0)
HX AST: 10 U/L — NL (ref 6.0–40.0)
HX BILIRUBIN TOTAL: 0.2 mg/dL — NL (ref 0.2–1.2)
HX BUN: 15 mg/dL — NL (ref 8.0–23.0)
HX CALCIUM LVL: 8.6 mg/dL — NL (ref 8.5–10.5)
HX CHLORIDE: 107 mmol/L — NL (ref 98.0–110.0)
HX CO2: 26 mmol/L — NL (ref 21.0–32.0)
HX CREATININE: 0.318 mg/dL — ABNORMAL LOW (ref 0.55–1.3)
HX GLUCOSE LVL: 95 mg/dL — NL (ref 70.0–110.0)
HX POTASSIUM LVL: 4.6 mmol/L — NL (ref 3.6–5.2)
HX SODIUM LVL: 138 mmol/L — NL (ref 136.0–146.0)
HX TOTAL PROTEIN: 5.7 g/dL — ABNORMAL LOW (ref 6.0–8.4)

## 2020-04-16 LAB — HX PHOSPHORUS LEVEL
CASE NUMBER: 2021271000169
HX PHOSPHORUS: 3.1 mg/dL — NL (ref 2.4–4.9)

## 2020-04-16 LAB — HX MAGNESIUM LEVEL
CASE NUMBER: 2021271000169
HX MAGNESIUM LVL: 2.4 mg/dL — NL (ref 1.7–2.5)

## 2020-04-17 LAB — HX PHOSPHORUS LEVEL
CASE NUMBER: 2021272000159
HX PHOSPHORUS: 3.6 mg/dL — NL (ref 2.4–4.9)

## 2020-04-17 LAB — HX BASIC METABOLIC PANEL
CASE NUMBER: 2021272000159
HX ANION GAP: 7 — NL (ref 3.0–11.0)
HX BUN: 18 mg/dL — NL (ref 8.0–23.0)
HX CALCIUM LVL: 8.8 mg/dL — NL (ref 8.5–10.5)
HX CHLORIDE: 105 mmol/L — NL (ref 98.0–110.0)
HX CO2: 26 mmol/L — NL (ref 21.0–32.0)
HX CREATININE: 0.289 mg/dL — ABNORMAL LOW (ref 0.55–1.3)
HX GLUCOSE LVL: 96 mg/dL — NL (ref 70.0–110.0)
HX POTASSIUM LVL: 4.6 mmol/L — NL (ref 3.6–5.2)
HX SODIUM LVL: 138 mmol/L — NL (ref 136.0–146.0)

## 2020-04-17 LAB — HX TSH REFLEX PANEL (RECOMMENDED)
CASE NUMBER: 21728135
HX 3RD GEN TSH: 0.775 u[IU]/mL — NL (ref 0.358–3.74)

## 2020-04-17 LAB — HX MAGNESIUM LEVEL
CASE NUMBER: 2021272000159
HX MAGNESIUM LVL: 2.2 mg/dL — NL (ref 1.7–2.5)

## 2020-04-17 LAB — HX CBC W/ INDICES
CASE NUMBER: 2021272000158
HX ABSOLUTE NRBC COUNT: 0 10*3/uL
HX HCT: 27.7 % — ABNORMAL LOW (ref 36.0–47.0)
HX HGB: 8.2 g/dL — ABNORMAL LOW (ref 11.8–16.0)
HX MCH: 22.2 pg — ABNORMAL LOW (ref 26.0–34.0)
HX MCHC: 29.6 g/dL — ABNORMAL LOW (ref 31.0–37.0)
HX MCV: 75.1 fL — ABNORMAL LOW (ref 80.0–100.0)
HX MPV: 10.7 fL — NL (ref 9.4–12.4)
HX NRBC PERCENT: 0 % — NL
HX PLATELET: 380 10*3/uL — NL (ref 150.0–400.0)
HX RBC: 3.69 10*6/uL — ABNORMAL LOW (ref 3.9–5.2)
HX RDW-CV: 21.3 % — ABNORMAL HIGH (ref 11.5–14.5)
HX RDW-SD: 58.1 fL — ABNORMAL HIGH (ref 35.0–51.0)
HX WBC: 7.6 10*3/uL — NL (ref 3.7–11.2)

## 2020-04-17 LAB — HX GLOMERULAR FILTRATION RATE (ESTIMATED)
CASE NUMBER: 2021272000159
HX AFN AMER GLOMERULAR FILTRATION RATE: >90
HX NON-AFN AMER GLOMERULAR FILTRATION RATE: >90

## 2020-04-18 LAB — HX BASIC METABOLIC PANEL
CASE NUMBER: 2021273000130
HX ANION GAP: 7 — NL (ref 3.0–11.0)
HX BUN: 20 mg/dL — NL (ref 8.0–23.0)
HX CALCIUM LVL: 8.6 mg/dL — NL (ref 8.5–10.5)
HX CHLORIDE: 103 mmol/L — NL (ref 98.0–110.0)
HX CO2: 26 mmol/L — NL (ref 21.0–32.0)
HX CREATININE: 0.285 mg/dL — ABNORMAL LOW (ref 0.55–1.3)
HX GLUCOSE LVL: 118 mg/dL — ABNORMAL HIGH (ref 70.0–110.0)
HX POTASSIUM LVL: 4.1 mmol/L — NL (ref 3.6–5.2)
HX SODIUM LVL: 136 mmol/L — NL (ref 136.0–146.0)

## 2020-04-18 LAB — HX GLOMERULAR FILTRATION RATE (ESTIMATED)
CASE NUMBER: 2021273000130
HX AFN AMER GLOMERULAR FILTRATION RATE: >90
HX NON-AFN AMER GLOMERULAR FILTRATION RATE: >90

## 2020-04-18 LAB — HX MAGNESIUM LEVEL
CASE NUMBER: 2021273000130
HX MAGNESIUM LVL: 1.8 mg/dL — NL (ref 1.7–2.5)

## 2020-04-18 LAB — HX PHOSPHORUS LEVEL
CASE NUMBER: 2021273000130
HX PHOSPHORUS: 3.5 mg/dL — NL (ref 2.4–4.9)

## 2020-04-18 LAB — HX BLUE TOP TO HOLD: CASE NUMBER: 2021273000130

## 2020-04-18 LAB — HX LAVENDER TOP TO HOLD: CASE NUMBER: 2021273000130

## 2020-04-19 LAB — HX BASIC METABOLIC PANEL
CASE NUMBER: 2021274000166
HX ANION GAP: 6 — NL (ref 3.0–11.0)
HX BUN: 23 mg/dL — NL (ref 8.0–23.0)
HX CALCIUM LVL: 8.6 mg/dL — NL (ref 8.5–10.5)
HX CHLORIDE: 107 mmol/L — NL (ref 98.0–110.0)
HX CO2: 28 mmol/L — NL (ref 21.0–32.0)
HX CREATININE: 0.351 mg/dL — ABNORMAL LOW (ref 0.55–1.3)
HX GLUCOSE LVL: 103 mg/dL — NL (ref 70.0–110.0)
HX POTASSIUM LVL: 4.3 mmol/L — NL (ref 3.6–5.2)
HX SODIUM LVL: 141 mmol/L — NL (ref 136.0–146.0)

## 2020-04-19 LAB — HX GLOMERULAR FILTRATION RATE (ESTIMATED)
CASE NUMBER: 2021274000166
HX AFN AMER GLOMERULAR FILTRATION RATE: >90
HX NON-AFN AMER GLOMERULAR FILTRATION RATE: >90

## 2020-04-19 LAB — HX PHOSPHORUS LEVEL
CASE NUMBER: 2021274000166
HX PHOSPHORUS: 3.4 mg/dL — NL (ref 2.4–4.9)

## 2020-04-19 LAB — HX MAGNESIUM LEVEL
CASE NUMBER: 2021274000166
HX MAGNESIUM LVL: 2 mg/dL — NL (ref 1.7–2.5)

## 2020-04-20 LAB — HX BASIC METABOLIC PANEL
CASE NUMBER: 2021275000131
HX ANION GAP: 7 — NL (ref 3.0–11.0)
HX BUN: 20 mg/dL — NL (ref 8.0–23.0)
HX CALCIUM LVL: 8.3 mg/dL — ABNORMAL LOW (ref 8.5–10.5)
HX CHLORIDE: 103 mmol/L — NL (ref 98.0–110.0)
HX CO2: 28 mmol/L — NL (ref 21.0–32.0)
HX CREATININE: 0.325 mg/dL — ABNORMAL LOW (ref 0.55–1.3)
HX GLUCOSE LVL: 110 mg/dL — NL (ref 70.0–110.0)
HX POTASSIUM LVL: 4 mmol/L — NL (ref 3.6–5.2)
HX SODIUM LVL: 138 mmol/L — NL (ref 136.0–146.0)

## 2020-04-20 LAB — HX GLOMERULAR FILTRATION RATE (ESTIMATED)
CASE NUMBER: 2021275000131
HX AFN AMER GLOMERULAR FILTRATION RATE: >90
HX NON-AFN AMER GLOMERULAR FILTRATION RATE: >90

## 2020-04-20 LAB — HX MAGNESIUM LEVEL
CASE NUMBER: 2021275000131
HX MAGNESIUM LVL: 2 mg/dL — NL (ref 1.7–2.5)

## 2020-04-20 LAB — HX PHOSPHORUS LEVEL
CASE NUMBER: 2021275000131
HX PHOSPHORUS: 3.8 mg/dL — NL (ref 2.4–4.9)

## 2020-04-23 ENCOUNTER — Ambulatory Visit: Admitting: Family

## 2020-04-23 ENCOUNTER — Ambulatory Visit (HOSPITAL_BASED_OUTPATIENT_CLINIC_OR_DEPARTMENT_OTHER): Admitting: Family

## 2020-04-23 LAB — HX URINALYSIS (COMPLETE)
CASE NUMBER: 2021278004484
HX UA BILIRUBIN: NEGATIVE — NL
HX UA BLOOD: NEGATIVE — NL
HX UA GLUCOSE: NEGATIVE — NL
HX UA KETONES: 5 mg/dL — AB
HX UA LEUKOCYTE ESTERASE: 250 WBC/uL — AB
HX UA NITRITE: NEGATIVE — NL
HX UA PH: 5 — NL (ref 5.0–8.0)
HX UA PROTEIN: NEGATIVE — NL
HX UA RBC: 2 /HPF — NL (ref 0.0–2.0)
HX UA SPECIFIC GRAVITY: 1.015 — NL (ref 1.003–1.03)
HX UA SQUAMOUS EPITHELIAL: <1 — NL (ref 0.0–5.0)
HX UA UROBILINOGEN: NEGATIVE — NL
HX UA WBC: 132 /HPF — ABNORMAL HIGH (ref 0.0–5.0)

## 2020-04-23 NOTE — Progress Notes (Signed)
* * *      [name], [name] **DOB:** 1935/07/14 (85 yo F) **Acc No.** [id number] **DOS:**  04/23/2020    ---        [name], [name]**    ------    37 Y old Female, DOB: 1934/11/02    [address] [place], [place] [address]    Home: [phone]    Provider: Mirna Mires        * * *    Telephone Encounter    ---    Answered by    Mirna Mires    Date: 04/23/2020        Time: 10:37 AM    Reason    Home Care lmtcb 10/6    ------            Action Taken                      [facility]  04/23/2020 10:37:31 AM > Pt reports services not yet set up by [facility] - can you call to make sure they establish care with pt? Looks like they're set up with [facility] as well but they would just do tube feed teaching and such. Needs PT established.      Robinson,Nicole  04/23/2020 12:35:21 PM > do you know if an order was sent from the hospital ?      [facility]  04/23/2020 1:59:07 PM > Yes the hospital had initially placed orders for care at her discharge, she has a tube feed as well      Robinson,Nicole  04/23/2020 2:20:06 PM > [facility] pt is scheduled for nursing nothing for PT . transferred to stephanie PT supervisor who states she does not see any PT orders pending      [facility]  04/23/2020 5:13:59 PM > Please let [facility] know that order for PT was on discharge summary from hospital and family is expecting PT eval, if they cannot get from hospital (they can view summary) let me know as we can give order to initiate.      Robinson,Nicole  04/24/2020 8:43:04 AM > faxed TE will call to f/u      Robinson,Nicole  04/24/2020 10:33:33 AM > lvm with [facility]      Robinson,Nicole  04/24/2020 10:54:32 AM > spoke with [facility] who state PT is bkd for tomorrow      [facility]  04/24/2020 10:59:49 AM > Thank you                    * * *                ---          * * *          Provider: Mirna Mires 04/23/2020    ---    Note generated by eClinicalWorks EMR/PM  Software ([email or web address])

## 2020-04-23 NOTE — Progress Notes (Signed)
* * [name], [name] **DOB:** 09-29-1934 (85 yo F) **Acc No.** [id number] **DOS:**  04/23/2020    ---        [name], [name]**    ------    61 Y old Female, DOB: Jan 13, 1935    [address], [place], [place] [address]    Home: [phone]    Provider: Mirna Mires        * * *    Telephone Encounter    ---    Answered by    Mirna Mires    Date: 04/23/2020        Time: 11:03 AM    Caller    [phone]- beverly daughter    ------            Reason    Rx review            Action Taken                      [facility]  04/23/2020 11:04:12 AM > Pt with confusion qhs, not sleeping well, almost delirious at times s/p hospitalization. Patient has restless leg syndrome, took 200 mg of gabapentin last night (had been off of it intermittently, did receive it some days in the hospital as 300 mg TID). Still have postprocedural nausea, on TF but can swallow pills with small amount of water. Looking for alternative option to help with RLS and sleep. Taking 100 mg of amitryptyline qhs.      Octavio Graves  04/25/2020 9:00:17 AM > Pt daughter calling asking for status, would like call back this am please to above number.       Abimelec Grochowski  04/25/2020 12:52:50 PM > UTI noted. Reviewed tx options with pharmacy team, will send tx. Amitryptyline and reglan may also contribute to increased confusion. Will review lyrica 50 mg for one week and increase up to 150 mg as appropriate.      Bradin Mcadory  04/25/2020 1:02:30 PM > [facility] text noting labs drawn today. Dr. Donny Pique changed tube feed to 55 ml/hour over 22 hours and 100 ml flush QID. HR 107. Reported fall yesterday AM with coccyx pain, had fall in hospital, similar. No scans done. No bruising noted or sores per [facility].            Markeia Harkless  04/25/2020 4:15:50 PM > Please advise daughter to stop gabapentin. Lyrica rx sent to start at 50 mg qhs. Only 1 tab of amitryptyline at night - will work on decreasing use over time as this can  cause confusion with reglan. Rx for cipro sent for UTI treatment. Please have her update me with how she is doing next week, sooner with any worsening symptoms. Please let her know I placed an order for [facility] for two days a week verbally with [facility] and they will start services.      Falcon, CMA,Nailin  04/25/2020 4:51:10 PM > spoke w/ pt dtr advised per instructions stated she understood all of the intructions and will call next week to let us know how pt is doing      [facility]  04/25/2020 9:03:55 PM > noted thank you                Refills    Start Ciprofloxacin Hydrochloride tablet, 500 mg, orally, 10  Tablet, 1 tab(s), every 12 hours, 5 day(s), Refills=0    ------      Stop gabapentin capsule, 100 mg, orally, 1-3 cap(s), qhs  Start pregabalin capsule, 50 mg, orally, 30 capsules, 1 cap(s), qhs, 30  day(s), Refills=0          * * *                ---          * * *          Provider: Mirna Mires 04/23/2020    ---    Note generated by eClinicalWorks EMR/PM Software ([email or web address])

## 2020-04-23 NOTE — Progress Notes (Signed)
Elaine, Garcia **DOB:** [birth year] (85 yo F) **Acc No.** [id number] **DOS:**  04/23/2020    ---      ** Progress Notes  **    ---    **Patient:** [name], [name]    **Account Number:** [id number]    **Provider:** Mirna Mires, FNP    **DOB:** 08-02-1934  **Age:** 64 Y  **Sex:** Female    **Date:** 04/23/2020    **Phone:** [phone]    **Address:** [address], [place], [place]    **Pcp:** PAUL G HARCOURT        * * *        **Subjective:**        ---      **Chief Complaints:**    ------      1\. admit to [facility] for bowel obstruction surgery, d/c'd 04/08/20.    ------      **HPI:**    _Interim History_ :    85 year old female presents with c/o Hospitalizations  Pt was admitted to [facility].  Diagnoses This Visit abdominal pain Unspecified intestinal obstruction,  unspecified as to partial versus complete obstruction [facility]  [facility], [place], [address] [place], [place] [id number], [phone] traMADol  (traMADol 50 mg oral tablet) 1 Tablet(s) By mouth every 12 hours as needed for  pain for 7 Day(s). Refills: 0. [facility], [address] [place],  [place] [id number], [phone] metoclopramide (metoclopramide 10 mg oral  tablet) 1 Tablet(s) By mouth every 6 hours. Refills: 0. ondansetron  (ondansetron 4 mg oral tablet) 1 Tablet(s) By mouth every 6 hours. Refills: 0.  sucralfate (sucralfate 1 g/10 mL oral suspension) 10 Milliliter By mouth 4  times a day. Refills: 0. Other Medications acetaminophen (acetaminophen 500 mg  oral tablet) 2 Tablet(s) By mouth every 8 hours. amitriptyline (amitriptyline  50 mg oral tablet) 2 Tablet(s) By mouth once a day (at bedtime). 1-2 tabs.  levothyroxine (levothyroxine 125 mcg (0.125 mg) oral tablet) 1 Tablet(s) By  mouth every day. pantoprazole (Protonix) 40 Milligram By mouth 2 times a day.        Pt send home with gtube, TF - set up with [facility]        Follow-up with Dr. Donny Pique scheduled for later in Sept  .    Patient reports no  abdominal pain at all. Intermittent nausea, managed well  with zofran and reglan. No episodes of vomiting since she left the hospital.  No fever or chills. Pt have trouble sleeping at night, seems delirious per  daughter, legs get twitch and she moans at times. Has some confusion at night,  in the AM doesn't remember the night before. Taking two amitriptyline tablets  before bed. Has gabapentin from Dr. Patricia Nettle? in [facility] for restless leg syndrome.  Also reports had UTI in the hospital, treated with Bactrim. Is on continuous  TF, 16 hours a day out of 24. Doing 100 mL flushes 4 times a day.    ------      **Medical History:** PUD, Carcinoid tumor, Insomnia, Hypothyroid, Basal  cell cancer, Hx of bowel obstruction.        ------      **Surgical History:** PUD , partial colectomy 2020 ,  BowelResection(N/A)(03/12/2020) pt also reports with revision 03/12/2020,  EGDwithAnesthesia(N/A)(04/05/2020 04/05/2020.    ------      **Hospitalization/Major Diagnostic Procedure:** bowel obstruction 02/20/2020.    ------      **Family History:**    no CAD, no DM.    ------      **  Medications:** Taking gabapentin 100 mg capsule 1-3 cap(s) orally qhs,  Taking acetaminophen 500 mg tablet 2 tab(s) orally every 8 hours, Taking  sucralfate 1 g/10 mL suspension 10 mL orally 4 times a day (before meals and  at bedtime), Taking ondansetron 4 mg tablet 5 mL orally every 6 hours, Taking  metoclopramide 10 mg tablet 1 tab(s) orally every 6 hours, Taking traMADol 50  mg tablet 1 tab(s) orally every 12 hours, Taking amitriptyline 50 mg tablet  1-2 tabs orally qhs, Taking levothyroxine 125 mcg (0.125 mg) tablet 1 tab(s)  orally qd, Taking famotidine 20 mg tablet orally bid, Taking pantoprazole 40  mg delayed release tablet 1 tab(s) orally bid, Not-Taking Protonix 40 mg  delayed release tablet orally bid, Not-Taking famotidine 20 mg tablet 1 tab(s)  orally 2 times a day, Medication List reviewed and reconciled with the patient    ------       **Allergies:** N.K.D.A.    ------        **Objective:**        ---      **Vitals:** HR **97** , BP **106/50** , O2 Sat. **97** , Ht 63, Wt **99.6**  , Wt Change -9.4 lb, BMI  **17.64** .    ------      **Examination:    ** _General Examination:_    General  alert and oriented  ,  well nourished and hydrated  ,  appropriate  attire and affect  .    Neck, Thyroid :  supple  ,  no thyromegaly  ,  no lymphadenopathy  .    Heart:  RRR  ,  no murmurs  .    Lungs:  clear to auscultation bilaterally  ,  no wheezes/rhonchi/rales  .    Abdomen:  Lateral incision of abdomen, fading ecchymosis of lower abd, +BSX4,  Gtube with intact dressing  .    Extremities  no clubbing, no edema  .    Skin:  moist, warm  .        ------            **Assessment:**        ---      **Assessment:**        1\. 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 Recent urinary tract infection - Z87.440    ------        **Plan:**        ---        **1\. Status post small bowel resection**    _LAB: Comprehensive Metabolic Panel_ Creatinine 0.396 Albumin 3.1 Glucose 117  Alk Phos 142      Value    Reference Range    ---------    Creatinine    0.396    L    0.550-1.300 - mg/dL    Sodium Lvl    161      136-146 - mmol/L    ------------    Potassium Lvl    3.9      3.6-5.2 - mmol/L    ------------    Chloride    102      98-110 - mmol/L    ------------    CO2    26      21-32 - mmol/L    ------------    Total Protein    6.5      6.0-8.4 - Gm/dL    ------------    Albumin Lvl    3.1  L    3.2-5.0 - Gm/dL    ------------    Calcium Lvl    9.1      8.5-10.5 - mg/dL    ------------    Glucose Lvl    117    H    70-110 - mg/dL    ------------    Bili Total    0.3      0.2-1.2 - mg/dL    ------------    Alk Phos    142    H    30-117 - Units/L    ------------    AST    17      6-40 - Units/L    ------------    ALT    42      6-55 - Units/L    ------------     Anion Gap    8      3-11 -    ------------    BUN    17      8-23 - mg/dL    ------------      [facility] 04/26/2020 11:14:52 AM >    ------    _LAB: CBC w/ Diff_ Hgb 8.7 Hct 31.1 MCV 77.6 MCH 21.7 MCHC 28.0 Platelets 539  RDS 61.2 WBC 14.0    Value    Reference Range    ---------    RBC    4.01      3.90-5.20 - Mil/mm3    Hgb    8.7    L    11.8-16.0 - Gm/dL    ------------    Hct    31.1    L    36.0-47.0 - %    ------------    MCV    77.6    L    80.0-100.0 - fL    ------------    MCH    21.7    L    26.0-34.0 - pGm    ------------    MCHC    28.0    L    31.0-37.0 - Gm/dL    ------------    Platelet    539    H    150-400 - thous/mm3    ------------    RDW-SD    61.2    H    35.0-51.0 - fL    ------------    MPV    11.1      9.4-12.4 - fL    ------------    WBC    14.0    H    3.7-11.2 - thous/mm3    ------------      [facility] 04/26/2020 8:13:16 AM > on abx for UTI    ------        Notes: Tolerating TF well at this time. Has had increased confusion qhs and  recent UTI with catheter use. Daughter reports seems delirious at times,  particularly qhs. Reviewed differentials such as hospital-associated delirium  as still having trouble with sleep patterns s/p hospitalization, UTI,  infection, medication interactions. Order for labs, UA/UC placed. Will tx as  appropriate and review regimen with Dr. Wayland Denis for further factors.    **2\. Recent urinary tract infection**    _LAB: Urine Dip (In House)_     Value    Reference Range    ---------    Bili    neg        Glucose    neg        ------------    Ketones  trace        ------------    SG    1.015        ------------    PH    6.0        ------------    blood    neg        ------------    Protein    neg        ------------    Uro    o.2        ------------    Nitrates    neg        ------------    Leuk    small         ------------    _LAB: Urine Culture_ >100,000 cfu/ml Klebsiella pneumoniae subsp pneumoniae    Value    Reference Range    ---------    C Urine    Laboratory, [facility], [address], [place], [place],  [address], [place]      \-      Mirna Mires 04/25/2020 12:52:45 PM >    ------    _LAB: Urinalysis (Complete)_ cloudy 5 ketones 250 leuks 132 wbc trace bacteria    Value    Reference Range    ---------    UA Clarity    Cloudy    A    Clear -    UA Spec Grav    1.015      1.003-1.030 -    ------------    UA pH    5.0      5.0-8.0 -    ------------    UA Protein    Negative      Negative - mg/dL    ------------    UA Glucose    Negative      Negative - mg/dL    ------------    UA Ketones    5    A    Negative - mg/dL    ------------    UA Blood    Negative      Negative -    ------------    UA Bili    Negative      Negative -    ------------    UA Urobilinogen    Negative      Negative -    ------------    UA Nitrite    Negative      Negative -    ------------    UA Leuk Est    250    A    Negative - WBC/uL    ------------    UA RBC    2      0-2 - /HPF    ------------    UA WBC    132    H    0-5 - /HPF    ------------    UA Bacteria    Trace    A    None -    ------------    UA Squamous Epi    <1      0-5 - /HPF    ------------    UA Mucous    Few      None -    ------------    UA CA Ox Crystal    Moderate    A    None -    ------------    UA Color    Dark Yellow      Yellow -    ------------    _LAB: Comprehensive  Metabolic Panel_ Creatinine 0.396 Albumin 3.1 Glucose 117  Alk Phos 142    Value    Reference Range    ---------    Creatinine    0.396    L    0.550-1.300 - mg/dL    Sodium Lvl    811      136-146 - mmol/L    ------------    Potassium Lvl    3.9      3.6-5.2 - mmol/L    ------------    Chloride    102      98-110 - mmol/L    ------------    CO2    26       21-32 - mmol/L    ------------    Total Protein    6.5      6.0-8.4 - Gm/dL    ------------    Albumin Lvl    3.1    L    3.2-5.0 - Gm/dL    ------------    Calcium Lvl    9.1      8.5-10.5 - mg/dL    ------------    Glucose Lvl    117    H    70-110 - mg/dL    ------------    Bili Total    0.3      0.2-1.2 - mg/dL    ------------    Alk Phos    142    H    30-117 - Units/L    ------------    AST    17      6-40 - Units/L    ------------    ALT    42      6-55 - Units/L    ------------    Anion Gap    8      3-11 -    ------------    BUN    17      8-23 - mg/dL    ------------      [facility] 04/26/2020 11:14:52 AM >    ------    _LAB: CBC w/ Diff_ Hgb 8.7 Hct 31.1 MCV 77.6 MCH 21.7 MCHC 28.0 Platelets 539  RDS 61.2 WBC 14.0    Value    Reference Range    ---------    RBC    4.01      3.90-5.20 - Mil/mm3    Hgb    8.7    L    11.8-16.0 - Gm/dL    ------------    Hct    31.1    L    36.0-47.0 - %    ------------    MCV    77.6    L    80.0-100.0 - fL    ------------    MCH    21.7    L    26.0-34.0 - pGm    ------------    MCHC    28.0    L    31.0-37.0 - Gm/dL    ------------    Platelet    539    H    150-400 - thous/mm3    ------------    RDW-SD    61.2    H    35.0-51.0 - fL    ------------    MPV    11.1      9.4-12.4 - fL    ------------    WBC    14.0    H    3.7-11.2 - thous/mm3    ------------  Demaria Deeney 04/26/2020 8:13:16 AM > on abx for UTI    ------        Notes: Recent UTI, catheter use. Will check UA/UC.      **Procedure Codes:** [id number] URINE DIP    ------      **Follow Up:** 3 Weeks, sooner for worsening or persistent symptoms    ------    ---    ---                ---    Electronically signed by Mirna Mires , FNP-C on 05/19/2020 at 09:48 PM EDT    Sign off status: Completed          * * *      **Provider:** Mirna Mires, FNP    **Date:** 04/23/2020     ------

## 2020-04-25 ENCOUNTER — Ambulatory Visit: Admitting: Family

## 2020-04-25 ENCOUNTER — Ambulatory Visit (HOSPITAL_BASED_OUTPATIENT_CLINIC_OR_DEPARTMENT_OTHER): Admitting: Family

## 2020-04-25 LAB — HX URINALYSIS (COMPLETE)
CASE NUMBER: 2021280003515
HX UA BILIRUBIN: NEGATIVE — NL
HX UA BLOOD: NEGATIVE — NL
HX UA GLUCOSE: NEGATIVE — NL
HX UA KETONES: 5 mg/dL — AB
HX UA LEUKOCYTE ESTERASE: 250 WBC/uL — AB
HX UA NITRITE: NEGATIVE — NL
HX UA PH: 6 — NL (ref 5.0–8.0)
HX UA PROTEIN: NEGATIVE — NL
HX UA RBC: 2 /HPF — NL (ref 0.0–2.0)
HX UA SPECIFIC GRAVITY: 1.016 — NL (ref 1.003–1.03)
HX UA SQUAMOUS EPITHELIAL: <1 — NL (ref 0.0–5.0)
HX UA UROBILINOGEN: NEGATIVE — NL
HX UA WBC: 68 /HPF — ABNORMAL HIGH (ref 0.0–5.0)

## 2020-04-25 LAB — HX CBC W/ DIFF
CASE NUMBER: 2021280003514
HX ABSOLUTE NRBC COUNT: 0 10*3/uL
HX HCT: 31.1 % — ABNORMAL LOW (ref 36.0–47.0)
HX HGB: 8.7 g/dL — ABNORMAL LOW (ref 11.8–16.0)
HX MCH: 21.7 pg — ABNORMAL LOW (ref 26.0–34.0)
HX MCHC: 28 g/dL — ABNORMAL LOW (ref 31.0–37.0)
HX MCV: 77.6 fL — ABNORMAL LOW (ref 80.0–100.0)
HX MPV: 11.1 fL — NL (ref 9.4–12.4)
HX NRBC PERCENT: 0 % — NL
HX PLATELET: 539 10*3/uL — ABNORMAL HIGH (ref 150.0–400.0)
HX RBC: 4.01 10*6/uL — NL (ref 3.9–5.2)
HX RDW-CV: 21.8 % — ABNORMAL HIGH (ref 11.5–14.5)
HX RDW-SD: 61.2 fL — ABNORMAL HIGH (ref 35.0–51.0)
HX WBC: 14 10*3/uL — ABNORMAL HIGH (ref 3.7–11.2)

## 2020-04-25 LAB — HX COMPREHENSIVE METABOLIC PANEL
CASE NUMBER: 2021280003514
HX ALBUMIN LVL: 3.1 g/dL — ABNORMAL LOW (ref 3.2–5.0)
HX ALKALINE PHOSPHATASE: 142 U/L — ABNORMAL HIGH (ref 30.0–117.0)
HX ALT: 42 U/L — NL (ref 6.0–55.0)
HX ANION GAP: 8 — NL (ref 3.0–11.0)
HX AST: 17 U/L — NL (ref 6.0–40.0)
HX BILIRUBIN TOTAL: 0.3 mg/dL — NL (ref 0.2–1.2)
HX BUN: 17 mg/dL — NL (ref 8.0–23.0)
HX CALCIUM LVL: 9.1 mg/dL — NL (ref 8.5–10.5)
HX CHLORIDE: 102 mmol/L — NL (ref 98.0–110.0)
HX CO2: 26 mmol/L — NL (ref 21.0–32.0)
HX CREATININE: 0.396 mg/dL — ABNORMAL LOW (ref 0.55–1.3)
HX GLUCOSE LVL: 117 mg/dL — ABNORMAL HIGH (ref 70.0–110.0)
HX POTASSIUM LVL: 3.9 mmol/L — NL (ref 3.6–5.2)
HX SODIUM LVL: 136 mmol/L — NL (ref 136.0–146.0)
HX TOTAL PROTEIN: 6.5 g/dL — NL (ref 6.0–8.4)

## 2020-04-25 LAB — HX .AUTOMATED DIFF
CASE NUMBER: 2021280003514
HX ABSOLUTE BASO COUNT: 0.03 10*3/uL — NL (ref 0.0–0.22)
HX ABSOLUTE EOS COUNT: 0.24 10*3/uL — NL (ref 0.0–0.45)
HX ABSOLUTE LYMPHS COUNT: 1.28 10*3/uL — NL (ref 0.74–5.04)
HX ABSOLUTE MONO COUNT: 0.78 10*3/uL — NL (ref 0.0–1.34)
HX ABSOLUTE NEUTRO COUNT: 11.63 10*3/uL — ABNORMAL HIGH (ref 1.48–7.95)
HX BASOPHILS: 0.2 %
HX EOSINOPHILS: 1.7 %
HX IMMATURE GRANULOCYTES: 0.6 % — NL (ref 0.0–2.0)
HX LYMPHOCYTES: 9.1 %
HX MONOCYTES: 5.6 %
HX NEUTROPHILS: 82.8 %

## 2020-04-25 LAB — HX .RBC MORPHOLOGY: CASE NUMBER: 2021280003514

## 2020-04-25 LAB — HX GLOMERULAR FILTRATION RATE (ESTIMATED)
CASE NUMBER: 2021280003514
HX AFN AMER GLOMERULAR FILTRATION RATE: >90
HX NON-AFN AMER GLOMERULAR FILTRATION RATE: >90

## 2020-04-26 ENCOUNTER — Ambulatory Visit (HOSPITAL_BASED_OUTPATIENT_CLINIC_OR_DEPARTMENT_OTHER): Admitting: Family Medicine

## 2020-04-26 NOTE — Progress Notes (Signed)
[name], Dacy **DOB:** 04/15/1935 (85 yo F) **Acc No.** [id number] **DOS:**  04/26/2020    ---        [name], [name]**    ------    54 Y old Female, DOB: Nov 26, 1934    [address] [place], [place] [address]    Home: [phone]    Provider: Cheryle Garcia        * * *    Telephone Encounter    ---    Answered by    [name]    Date: 04/26/2020        Time: 05:53 PM    Reason    Pt vomiting    ------            Action Taken                      [name]  04/26/2020 6:01:26 PM > Pharmacy reporting meds would not be in until Monday or Tuesday if switched to liquid formulations/compounded. 3 episodes of vomiting, 2 were last night and this morning. Her pharmacy does not have liquid formulations available, [facility] see  what meds can be switched to liquid.       [name]  04/27/2020 9:56:54 AM > No c/b received last night from [name]. Pantoprazole suspension may exist but not at [name]. [phone] Attn [name] - he will work on converting meds at [name]. LM on pt's phone for check in.       [name]  04/27/2020 9:59:44 AM > Can you fax the patient's medication list to [phone] Attn [name]? Sending it to [name]. Thank you.      [name]  04/27/2020 11:13:50 AM > I faxed patient's medication list to [name] at the above number      [name]  04/27/2020 1:27:08 PM > Spoke with [name]. Sucralfate needs to be stopped one hour before and also after for gtube, may be better to give orally if tolerated. Cipro cannot go via gtube (will be done by Monday). Zofran can switch to ODT, also can be liquid via gtube. Reglan can be liquid and given via gtube doesn't require TF hold, also has ODT. Tramadol can be compounded, famotidine can be compounded. Pantoprazole specifically needs a teach if sent for gtube, needs to be dissolved with sodium bicarb 8.4%. Alternative may be preferred.            Spoke  with daughter [name] who reports that confusion with patient has improved greatly with UTI tx, Lyrica helped last night with RLS. Is giving meds one by one with improvement. Pt is switching between homes over next couple of days until she can move back into daughter [name]'s home on Monday who will be ending COVID-19 quarantine. Precautions reviewed. Gtube medication administration teach will be needed for family.            [name]  04/27/2020 3:24:01 PM > Reviewed rx's with Dr. Gwendel Garcia, ok to stop amitryptyline, LM for daughter [name] regarding this and that RXs will be delivered to [name]'s home. Aware of on-call on weekend if needed.            Dr. Gwendel Garcia - Can you call pharmacy on Monday to find out what doses of omeprazole they have available/recommend for gtube administration? Pt is on 40 mg of pantoprazole, was unable to speak with pharmacy about converting over to alternative PPI to confirm concentration/recommended formulation as gone for  the day? Looks like 4mg /ml powder for suspension that is not in our compendium may be preferred for pt's dosing/volume needed but wanted to confirm. Spoke with Elaine Dandy at [name] regarding need for teach for gtube medications, they will have appt moved to Monday. Please have Paulette check to see if we received a prior auth notice on the ODT meds. Thank you!      Livianna Petraglia G 04/28/2020 7:38:51 PM >  there is a liquid omeprazole 20 mg than would be equivalent to protonix 40 mg.   sent in 10 ml = 20 mg bid  to [name]  04/30/2020 4:33:48 PM > [name], [name] calling in regards to the amitryptiine and wondering if okay to stop. Reviewed message, looks like okay to stop was given 04/27/20 and then we were investigating a liquid omeprazole/pantoprazole. [name] states the family filled teh zofran and declined the others as she has been continuing to take her pills just fine.            [name]  04/30/2020 5:10:34 PM > Spoke with [name] at [name] via [name], confirmed family refused gtube meds due to cost and will be stopping amitriptyline. [name] will inform [name]. Pt not having an issue swallowing now per report given to [name] by pt and only nausea/single episode of vomiting qhs after amitryptyline which is being stopped. [name] reports pt is doing much better at this time. FYI to Dr. Gwendel Garcia regarding gtube med refusal - [name].      [name] G 04/30/2020 5:46:28 PM >  noted.                 Refills    Continue Ciprofloxacin Hydrochloride tablet, 500 mg, orally, 1  tab(s), every 12 hours    ------      Continue pantoprazole delayed release tablet, 40 mg, orally, 1 tab(s), bid      Refill levothyroxine tablet, 125 mcg (0.125 mg), gtube, 90, 1 tab(s), once  a day, Refills=1      Stop amitriptyline tablet, 50 mg, orally, 14 tablets, 1 tab(s), qhs, 14  days      Refill pregabalin capsule, 50 mg, gtube, 30 capsules, 1 cap, qhs, 30 days,  Refills=0      Stop ondansetron tablet, 4 mg, orally, 5 mL, every 6 hours      Start ondansetron tablet, disintegrating, 4 mg, orally, 120 Tablet, 1  tab(s), 4 times a day, 30 days, Refills=0      Continue sucralfate suspension, 1 g/10 mL, orally, 10 mL, 4 times a day  (before meals and at bedtime)      Start metoclopramide tablet, disintegrating, 10 mg, orally, 120 tablets, 1  tab(s), 4 times a day, 30 days, Refills=0      Refill famotidine tablet, 20 mg, gtube, 60 Tablet, 1 tab(s), bid, 30 days,  Refills=0      Start omeprazole suspension, 2 mg/mL, orally, 600, 10 ml, bid, Refills=0          * * *              * * *        ---        Reason for Appointment    ---      1\. Pt vomiting    ---      Treatment    ---      **1\. Others**    Continue Ciprofloxacin Hydrochloride  tablet, 500 mg, 1 tab(s), orally, every  12 hours    Continue pantoprazole delayed release tablet, 40 mg, 1 tab(s), orally, bid    Refill levothyroxine tablet, 125 mcg (0.125 mg),  1 tab(s), gtube, once a day,  90, Refills 1, Notes: Please compound into liquid    Stop amitriptyline tablet, 50 mg, 1 tab(s), orally, qhs, 14 days, 14 tablets,  Notes: Please compound into liquid    Refill pregabalin capsule, 50 mg, 1 cap, gtube, qhs, 30 days, 30 capsules,  Refills 0, Notes: Please compound into liquid    Stop ondansetron tablet, 4 mg, 5 mL, orally, every 6 hours    Start ondansetron tablet, disintegrating, 4 mg, 1 tab(s), orally, 4 times a  day, 30 days, 120 Tablet, Refills 0    Continue sucralfate suspension, 1 g/10 mL, 10 mL, orally, 4 times a day  (before meals and at bedtime)    Start metoclopramide tablet, disintegrating, 10 mg, 1 tab(s), orally, 4 times  a day, 30 days, 120 tablets, Refills 0    Refill famotidine tablet, 20 mg, 1 tab(s), gtube, bid, 30 days, 60 Tablet,  Refills 0, Notes: Please compound into liquid    Start omeprazole suspension, 2 mg/mL, 10 ml, orally, bid, 600, Refills 0,  Notes: instead of protonix    ---          * * *          Provider: Cheryle Garcia 04/26/2020    ---    Note generated by eClinicalWorks EMR/PM Software ([email or web address])

## 2020-04-30 ENCOUNTER — Ambulatory Visit (HOSPITAL_BASED_OUTPATIENT_CLINIC_OR_DEPARTMENT_OTHER): Admitting: Family Medicine

## 2020-05-06 ENCOUNTER — Ambulatory Visit

## 2020-05-07 ENCOUNTER — Ambulatory Visit

## 2020-05-09 ENCOUNTER — Ambulatory Visit (HOSPITAL_BASED_OUTPATIENT_CLINIC_OR_DEPARTMENT_OTHER)

## 2020-05-14 ENCOUNTER — Ambulatory Visit (HOSPITAL_BASED_OUTPATIENT_CLINIC_OR_DEPARTMENT_OTHER): Admitting: Family Medicine

## 2020-05-14 ENCOUNTER — Ambulatory Visit: Admitting: Family Medicine

## 2020-05-14 LAB — HX URINALYSIS (COMPLETE)
CASE NUMBER: 2021299001808
HX UA BILIRUBIN: NEGATIVE — NL
HX UA BLOOD: NEGATIVE — NL
HX UA GLUCOSE: NEGATIVE — NL
HX UA HYALINE CASTS: 2 /LPF — NL (ref 0.0–4.0)
HX UA KETONES: NEGATIVE — NL
HX UA LEUKOCYTE ESTERASE: NEGATIVE — NL
HX UA NITRITE: NEGATIVE — NL
HX UA PH: 6 — NL (ref 5.0–8.0)
HX UA PROTEIN: NEGATIVE — NL
HX UA RBC: 1 /HPF — NL (ref 0.0–2.0)
HX UA SPECIFIC GRAVITY: 1.012 — NL (ref 1.003–1.03)
HX UA SQUAMOUS EPITHELIAL: <1 — NL (ref 0.0–5.0)
HX UA UROBILINOGEN: NEGATIVE — NL
HX UA WBC: <1 — NL (ref 0.0–5.0)

## 2020-05-14 LAB — HX GLOMERULAR FILTRATION RATE (ESTIMATED)
CASE NUMBER: 2021299001807
HX AFN AMER GLOMERULAR FILTRATION RATE: >90
HX NON-AFN AMER GLOMERULAR FILTRATION RATE: >90

## 2020-05-14 LAB — HX CBC W/ INDICES
CASE NUMBER: 2021299001807
HX ABSOLUTE NRBC COUNT: 0 10*3/uL
HX HCT: 29.9 % — ABNORMAL LOW (ref 36.0–47.0)
HX HGB: 8.8 g/dL — ABNORMAL LOW (ref 11.8–16.0)
HX MCH: 22 pg — ABNORMAL LOW (ref 26.0–34.0)
HX MCHC: 29.4 g/dL — ABNORMAL LOW (ref 31.0–37.0)
HX MCV: 74.8 fL — ABNORMAL LOW (ref 80.0–100.0)
HX MPV: 11.7 fL — NL (ref 9.4–12.4)
HX NRBC PERCENT: 0 % — NL
HX PLATELET: 400 10*3/uL — NL (ref 150.0–400.0)
HX RBC: 4 10*6/uL — NL (ref 3.9–5.2)
HX RDW-CV: 19.4 % — ABNORMAL HIGH (ref 11.5–14.5)
HX RDW-SD: 53.1 fL — ABNORMAL HIGH (ref 35.0–51.0)
HX WBC: 10.4 10*3/uL — NL (ref 3.7–11.2)

## 2020-05-14 LAB — HX BASIC METABOLIC PANEL
CASE NUMBER: 2021299001807
HX ANION GAP: 6 — NL (ref 3.0–11.0)
HX BUN: 18 mg/dL — NL (ref 8.0–23.0)
HX CALCIUM LVL: 9 mg/dL — NL (ref 8.5–10.5)
HX CHLORIDE: 100 mmol/L — NL (ref 98.0–110.0)
HX CO2: 26 mmol/L — NL (ref 21.0–32.0)
HX CREATININE: 0.471 mg/dL — ABNORMAL LOW (ref 0.55–1.3)
HX GLUCOSE LVL: 97 mg/dL — NL (ref 70.0–110.0)
HX POTASSIUM LVL: 5.2 mmol/L — NL (ref 3.6–5.2)
HX SODIUM LVL: 132 mmol/L — ABNORMAL LOW (ref 136.0–146.0)

## 2020-05-14 LAB — HX TSH
CASE NUMBER: 2021299001807
HX 3RD GEN TSH: 0.154 u[IU]/mL — ABNORMAL LOW (ref 0.358–3.74)

## 2020-05-14 NOTE — Progress Notes (Signed)
* * *      [name], [name] **DOB:** 1935-03-20 (85 yo F) **Acc No.** [id number] **DOS:**  05/14/2020    ---        [name], [name]**    ------    [name], DOB: 06-27-1935    [address], [place], [place] [address]    Home: [phone]    Provider: Cheryle Horsfall        * * *    Telephone Encounter    ---    Answered by    Arlean Hopping    Date: 05/14/2020        Time: 03:51 PM    Caller    French Ana    ------            Reason    early fill            Message                      need to call and give permission for early fill of pregabalin                Action Taken                      Ellyana Crigler G 05/14/2020 6:08:41 PM >  OK to fill early.  she is only on 50 mg once a day.   she was confused about the dosage. tx PH      Hunter,Stephanie  05/15/2020 11:52:35 AM > spoke with pharmacy- will override for earlky fill                    * * *                ---          * * *          Provider: Cheryle Horsfall 05/14/2020    ---    Note generated by eClinicalWorks EMR/PM Software ([email or web address])

## 2020-05-14 NOTE — Progress Notes (Signed)
ZOEIE, RITTER **DOB:** 11-Jun-1935 (85 yo F) **Acc No.** [id number] **DOS:**  05/14/2020    ---      ** Progress Note  **    ---    **Patient:** [name], [name]    **Account Number:** [id number]    **Provider:** Electa Sniff, M.D    **DOB:** [birth year]  **Age:** 85 Y  **Sex:** Female    **Date:** 05/14/2020    **Phone:** [phone]    **Address:** [address] [place], [id number]        * * *        **Subjective:**        ---      **Chief Complaints:**    ------      1\. 25mo f/u.    ------      **HPI:**    _Interim History_ :    Here in follow up. barium study showed patent GI tract, now stopped tube feeds  and tolerating po. has not vomited in a week. moving bowels. no fever. back on  amitryptilline at night, helps her sleep. lyrica helps with legs. was tx for  UTI, no symptoms. gaining weight slowly. sundowning has resolved.    ------      **Medical History:** PUD, Carcinoid tumor, Insomnia, Hypothyroid, Basal  cell cancer, Hx of bowel obstruction.        ------      **Social History:** no Tobacco Use . Seat belt: yes. no Alcohol. no Drug  use. Diet/Nutrition: Two meals a day. Exercise: yes. Marital Status: widowed.  no Domestic violence. Children: 3. Number of household members: 5. Occupation:  Retired Engineer, maintenance (IT). Occup. exposure: none. no Travel outside [place]. no  Caffeine. Home smoke detector use: yes. no Pets.    ------      **Medications:** Taking amitriptyline , Taking pantoprazole 40 mg delayed  release tablet 1 tab(s) orally bid, Taking levothyroxine 125 mcg (0.125 mg)  tablet 1 tab(s) gtube once a day, Notes: Please compound into liquid, Taking  pregabalin 50 mg capsule 1 cap gtube qhs, Notes: Please compound into liquid,  Taking ondansetron 4 mg tablet, disintegrating 1 tab(s) orally 4 times a day,  Discontinued acetaminophen 500 mg tablet 2 tab(s) orally every 8 hours,  Discontinued metoclopramide 10 mg tablet, disintegrating 1 tab(s) orally 4  times a day, Discontinued  metoclopramide 10 mg tablet 1 tab(s) orally every 6  hours, Discontinued traMADol 50 mg tablet 1 tab(s) orally every 12 hours,  Discontinued Ciprofloxacin Hydrochloride 500 mg tablet 1 tab(s) orally every  12 hours, Discontinued sucralfate 1 g/10 mL suspension 10 mL orally 4 times a  day (before meals and at bedtime), Discontinued famotidine 20 mg tablet 1  tab(s) gtube bid, Notes: Please compound into liquid, Discontinued omeprazole  2 mg/mL suspension 10 ml orally bid, Notes: instead of protonix, Discontinued  Protonix 40 mg delayed release tablet orally bid, Discontinued famotidine 20  mg tablet 1 tab(s) orally 2 times a day, Medication List reviewed and  reconciled with the patient    ------      **Allergies:** N.K.D.A.    ------        **Objective:**        ---      **Vitals:** HR **96** , BP **108/58** , O2 Sat. **97** , Ht 63, Wt  **101.6** , Wt Change 2 lb, BMI  **18.00** .    ------      **Examination:    ** _General Examination:_    General  alert and oriented  ,  well nourished and hydrated  ,  appropriate  attire and affect  .    Neck, Thyroid :  supple  ,  no thyromegaly  ,  no lymphadenopathy  .    Heart:  RRR  ,  no murmurs  .    Lungs:  clear to auscultation bilaterally  ,  no wheezes/rhonchi/rales  .    Abdomen:  Lateral incision of abdomen, fading ecchymosis of lower abd, +BSX4,  Gtube with intact dressing  .    Extremities  no clubbing, no edema  .    Skin:  moist, warm  .        ------            **Assessment:**        ---      **Assessment:**        1\. Complete intestinal obstruction, unspecified cause - K56.601 (Primary)    2\. Peptic ulcer - K27.9    3\. Malnutrition, unspecified type - E46    4\. Needs flu shot - Z23    5\. 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 Urinary tract infection without hematuria, site unspecified - N39.0    7\. Iron deficiency anemia, unspecified iron deficiency anemia type - D50.9    8\. Restless leg syndrome - G25.81    9\. Hypothyroidism,  unspecified type - E03.9    10\. Insomnia, unspecified type - G47.00    ------        **Plan:**        ---        **1\. Complete intestinal obstruction, unspecified cause**    Notes: better now. off tube feeds. advance diet, encourage hydration,  nutrition, activity.    ---    **2\. Peptic ulcer**    _LAB: Basic Metabolic Panel_ Na 132      Value    Reference Range    ---------    Creatinine    0.471    L    0.550-1.300 - mg/dL    Sodium Lvl    301    L    136-146 - mmol/L    ------------    Potassium Lvl    5.2      3.6-5.2 - mmol/L    ------------    Chloride    100      98-110 - mmol/L    ------------    CO2    26      21-32 - mmol/L    ------------    Calcium Lvl    9.0      8.5-10.5 - mg/dL    ------------    Glucose Lvl    97      70-110 - mg/dL    ------------    Anion Gap    6      3-11 -    ------------    BUN    18      8-23 - mg/dL    ------------    _LAB: CBC w/ Indices_ hct 29    Value    Reference Range    ---------    WBC    10.4      3.7-11.2 - thous/mm3    RBC    4.00      3.90-5.20 - Mil/mm3    ------------    Hgb    8.8    L    11.8-16.0 - Gm/dL    ------------    Hct  29.9    L    36.0-47.0 - %    ------------    MCV    74.8    L    80.0-100.0 - fL    ------------    MCH    22.0    L    26.0-34.0 - pGm    ------------    MCHC    29.4    L    31.0-37.0 - Gm/dL    ------------    Platelet    400      150-400 - thous/mm3    ------------    RDW-SD    53.1    H    35.0-51.0 - fL    ------------    MPV    11.7      9.4-12.4 - fL    ------------        Notes: had surgery for this. on PPI.    **3\. Malnutrition, unspecified type**    Notes: albumin was low. encourage nutrition.    **4\. Urinary tract infection without hematuria, site unspecified**    _LAB: Urinalysis (Complete)_     Value    Reference Range    ---------    UA Clarity    Clear      Clear -    UA Spec Grav     1.012      1.003-1.030 -    ------------    UA pH    6.0      5.0-8.0 -    ------------    UA Protein    Negative      Negative - mg/dL    ------------    UA Glucose    Negative      Negative - mg/dL    ------------    UA Ketones    Negative      Negative - mg/dL    ------------    UA Blood    Negative      Negative -    ------------    UA Bili    Negative      Negative -    ------------    UA Urobilinogen    Negative      Negative -    ------------    UA Nitrite    Negative      Negative -    ------------    UA Leuk Est    Negative      Negative - WBC/uL    ------------    UA RBC    1      0-2 - /HPF    ------------    UA WBC    <1      0-5 - /HPF    ------------    UA Bacteria    Trace    A    None -    ------------    UA Squamous Epi    <1      0-5 - /HPF    ------------    UA Mucous    Few      None -    ------------    UA Hyal Cast    2      0-4 - /LPF    ------------    UA CA Ox Crystal    Few    A    None -    ------------    UA Color    Yellow      Yellow -    ------------  Notes: recheck to document clearing. never had symptoms.    **5\. Iron deficiency anemia, unspecified iron deficiency anemia type**    Notes: recheck , iron supplements prn.    **6\. Restless leg syndrome**    Notes: lyrica helps.    **7\. Hypothyroidism, unspecified type**    _LAB: TSH_ 0.154      Value    Reference Range    ---------    3rd Gen TSH    0.154    L    0.358-3.740 - mclU/ml        Notes: recheck and adjust med prn.    **8\. Insomnia, unspecified type**    Notes: amitriptylline 50 mg at night. careful of constipation.      **Immunizations:**    Influenza - Hi-dose : 0.5 mL (Dose No:1) (Route: Intramuscular) given by  Arlean Hopping on Left Deltoid    ------      **Procedure Codes:** 99371 FLU VACC PRSV FREE INC ANTIG, 69678 IMMUNIZATION  ADMIN    ------      **Follow Up:** 4 Weeks, prn    ------     ---    ---                ---    Electronically signed by Electa Sniff , MD on 05/15/2020 at 08:00 AM EDT    Sign off status: Completed          * * *      **Provider:** Electa Sniff, M.D    **Date:** 05/14/2020    ------

## 2020-05-14 NOTE — Progress Notes (Signed)
* * *      [name], [name] **DOB:** [birth year] (85 yo F) **Acc No.** [id number] **DOS:**  05/14/2020    ---        [name], [name]**    ------    30 Y old Female, DOB: [birth year]    [address], [place], [place] [address]    Home: [phone]    Provider: Cheryle Horsfall        * * *    Telephone Encounter    ---    Answered by    Cheryle Horsfall    Date: 05/14/2020        Time: 06:57 PM    Reason    results    ------            Message                      labs:   hct 29, microcytic and  TSH suppressed.                 Action Taken                      Rendon Howell G 05/14/2020 6:57:40 PM > iron supplements recommended.   we can adjust thyroid if symptomatic.                     * * *                ---          * * *          Provider: Cheryle Horsfall 05/14/2020    ---    Note generated by eClinicalWorks EMR/PM Software ([email or web address])

## 2020-05-14 NOTE — Progress Notes (Signed)
* * *      HENDLER, [place] **DOB:** [birth year] (85 yo F) **Acc No.** [id number] **DOS:**  05/14/2020    ---        [name], [name]**    ------    4 Y old Female, DOB: [birth year]    [address], [place], [place] [address]    Home: [phone]    Provider: Cheryle Horsfall        * * *    Telephone Encounter    ---    Answered by    Cheryle Horsfall    Date: 05/14/2020        Time: 11:33 AM    Message                      lyrica   50  mg qday  to [facility]        ------            Action Taken                      [name] 05/14/2020 11:34:42 AM > erx done                Refills    Start pregabalin capsule, 50 mg, orally, 30, 1 cap(s), qday,  Refills=1    ------          * * *                ---          * * *          Provider: Cheryle Horsfall 05/14/2020    ---    Note generated by eClinicalWorks EMR/PM Software ([email or web address])

## 2020-05-22 ENCOUNTER — Ambulatory Visit

## 2020-05-27 ENCOUNTER — Ambulatory Visit

## 2020-05-28 ENCOUNTER — Ambulatory Visit

## 2020-06-06 ENCOUNTER — Ambulatory Visit (HOSPITAL_BASED_OUTPATIENT_CLINIC_OR_DEPARTMENT_OTHER): Admitting: Family Medicine

## 2020-06-06 ENCOUNTER — Ambulatory Visit

## 2020-06-06 NOTE — Progress Notes (Signed)
* * *      Elaine, Garcia **DOB:** 05-30-1935 (85 yo F) **Acc No.** [id number] **DOS:**  06/06/2020    ---        [name], [name]**    ------    [name], DOB: 1935/07/15    [address] [place], [place] [address]    Home: [phone]    Provider: Cheryle Horsfall        * * *    Telephone Encounter    ---    Answered by    Mirna Mires    Date: 06/06/2020        Time: 01:34 PM    Reason    [facility] update    ------            Action Taken                      [facility]  06/06/2020 1:34:52 PM > [facility] sent message via [facility] connect. pt was going to discharge today but vomited a large amount at 6AM after eating cake and a cookie. will follow bland diet going forward and I ok'd verbal order for dietician. Will be discharged from services Monday after Thanksgiving. VS 114 apically, regular, BP 128/54. Received msg after [facility] left but sent OK for services. FYI on pulse. Thank you.      Jay Haskew G 06/06/2020 1:41:17 PM > probably needs more hydration if she's tachycardic and we'll check her hct when she comes in next week            [facility]  06/06/2020 1:54:53 PM > Pt reminded to focus on hydration, she reports since vomiting she has been feeling better and is now drinking more, reminder on appt given for next week, advised to contact the office sooner with any worsening symptoms.                     * * *                ---          * * *          Provider: Cheryle Horsfall 06/06/2020    ---    Note generated by eClinicalWorks EMR/PM Software ([email or web address])

## 2020-06-11 ENCOUNTER — Ambulatory Visit (HOSPITAL_BASED_OUTPATIENT_CLINIC_OR_DEPARTMENT_OTHER): Admitting: Family Medicine

## 2020-06-11 ENCOUNTER — Ambulatory Visit

## 2020-06-11 ENCOUNTER — Ambulatory Visit: Admitting: Family Medicine

## 2020-06-11 LAB — HX CBC W/ INDICES
CASE NUMBER: 2021327001939
HX ABSOLUTE NRBC COUNT: 0 10*3/uL
HX HCT: 29.7 % — ABNORMAL LOW (ref 36.0–47.0)
HX HGB: 8.5 g/dL — ABNORMAL LOW (ref 11.8–16.0)
HX MCH: 20.4 pg — ABNORMAL LOW (ref 26.0–34.0)
HX MCHC: 28.6 g/dL — ABNORMAL LOW (ref 31.0–37.0)
HX MCV: 71.4 fL — ABNORMAL LOW (ref 80.0–100.0)
HX MPV: 11.1 fL — NL (ref 9.4–12.4)
HX NRBC PERCENT: 0 % — NL
HX PLATELET: 552 10*3/uL — ABNORMAL HIGH (ref 150.0–400.0)
HX RBC: 4.16 10*6/uL — NL (ref 3.9–5.2)
HX RDW-CV: 18.7 % — ABNORMAL HIGH (ref 11.5–14.5)
HX RDW-SD: 48.5 fL — NL (ref 35.0–51.0)
HX WBC: 12.7 10*3/uL — ABNORMAL HIGH (ref 3.7–11.2)

## 2020-06-11 LAB — HX URINALYSIS (COMPLETE)
CASE NUMBER: 2021327001940
HX UA BILIRUBIN: NEGATIVE — NL
HX UA GLUCOSE: NEGATIVE — NL
HX UA KETONES: NEGATIVE — NL
HX UA LEUKOCYTE ESTERASE: 500 WBC/uL — AB
HX UA NITRITE: NEGATIVE — NL
HX UA PH: 5 — NL (ref 5.0–8.0)
HX UA PROTEIN: 100 mg/dL — AB
HX UA RBC: 109 /HPF — ABNORMAL HIGH (ref 0.0–2.0)
HX UA SPECIFIC GRAVITY: 1.016 — NL (ref 1.003–1.03)
HX UA SQUAMOUS EPITHELIAL: <1 — NL (ref 0.0–5.0)
HX UA UROBILINOGEN: NEGATIVE — NL
HX UA WBC: >182 — ABNORMAL HIGH (ref 0.0–5.0)

## 2020-06-11 LAB — HX BASIC METABOLIC PANEL
CASE NUMBER: 2021327001939
HX ANION GAP: 7 — NL (ref 3.0–11.0)
HX BUN: 17 mg/dL — NL (ref 8.0–23.0)
HX CALCIUM LVL: 8.6 mg/dL — NL (ref 8.5–10.5)
HX CHLORIDE: 98 mmol/L — NL (ref 98.0–110.0)
HX CO2: 26 mmol/L — NL (ref 21.0–32.0)
HX CREATININE: 0.581 mg/dL — NL (ref 0.55–1.3)
HX GLUCOSE LVL: 119 mg/dL — ABNORMAL HIGH (ref 70.0–110.0)
HX POTASSIUM LVL: 4.7 mmol/L — NL (ref 3.6–5.2)
HX SODIUM LVL: 131 mmol/L — ABNORMAL LOW (ref 136.0–146.0)

## 2020-06-11 LAB — HX GLOMERULAR FILTRATION RATE (ESTIMATED)
CASE NUMBER: 2021327001939
HX AFN AMER GLOMERULAR FILTRATION RATE: >90
HX NON-AFN AMER GLOMERULAR FILTRATION RATE: 85 mL/min/{1.73_m2}

## 2020-06-11 LAB — HX TSH
CASE NUMBER: 2021327001939
HX 3RD GEN TSH: 0.334 u[IU]/mL — ABNORMAL LOW (ref 0.358–3.74)

## 2020-06-11 NOTE — Progress Notes (Signed)
* * *      [name], [name] **DOB:** 09/09/1934 (85 yo F) **Acc No.** [id number] **DOS:**  06/11/2020    ---        [name], [name]**    ------    20 Y old Female, DOB: 1935/01/27    [address] [place], [place] [address]    Home: [phone]    Provider: Cheryle Horsfall        * * *    Telephone Encounter    ---    Answered by    Cheryle Horsfall    Date: 06/11/2020        Time: 11:35 AM    Message                      wants second opinion with MD at [facility]:    Claiborne Billings.     fax   [phone]           ------            Action Taken                      Thoma Paulsen G 06/11/2020 11:37:06 AM > will send records.                    * * *                ---          * * *          Provider: Cheryle Horsfall 06/11/2020    ---    Note generated by eClinicalWorks EMR/PM Software ([email or web address])

## 2020-06-11 NOTE — Progress Notes (Signed)
* * *      [name], [name] **DOB:** 23-Jun-1935 (85 yo F) **Acc No.** [id number] **DOS:**  06/11/2020    ---        [name], [name]**    ------    89 Y old Female, DOB: [birth year]    [address] [place], [place] [address]    Home: [phone]    Provider: Cheryle Horsfall        * * *    Telephone Encounter    ---    Answered by    Tona Sensing CMA, Delice Bison    Date: 06/11/2020        Time: 11:20 AM    Caller    [name]    ------            Reason    dietician not covered            Message                      insurance does not cover a dietician for pt through them. Asking if we could do outside connection. They are discharging  on Monday. CB # [name] Nimrod [phone]                Action Taken                      Cheryle Horsfall 06/11/2020 2:00:24 PM >   Her GI  issues are more mechanical/ surgical right now.  she is on tube feeds at night                    * * *                ---          * * *          Provider: Cheryle Horsfall 06/11/2020    ---    Note generated by eClinicalWorks EMR/PM Software ([email or web address])

## 2020-06-11 NOTE — Progress Notes (Signed)
PAMILA, MENDIBLES **DOB:** December 21, 1934 (85 yo F) **Acc No.** [id number] **DOS:**  06/11/2020    ---      ** Progress Notes  **    ---    **Patient:** [name], [name]    **Account Number:** [id number]    **Provider:** Electa Sniff, M.D    **DOB:** May 10, 1935  **Age:** 85 Y  **Sex:** Female    **Date:** 06/11/2020    **Phone:** [phone]    **Address:** [address] [place], [id number]        * * *        **Subjective:**        ---      **Chief Complaints:**    ------      1\. 4 week follow up. 2. feeling better has a feeding tube VNA states not  discharged due to decreased heart rate. 3. Skips feeding at times.    ------      **HPI:**    _Interim History_ :    Here in follow up. still doing poorly. vomits after eating. no fever. no pain.  does have BMs. tube feeds working well at night at 50 cc/hr. barium study  showed patent GI tract, has not vomited past few days. . moving bowels. no  fever. back on amitryptilline at night, helps her sleep. lyrica helps with  legs. was tx for UTI, has some urinary incontinence.    ------      **Medical History:** PUD, Carcinoid tumor, Insomnia, Hypothyroid, Basal  cell cancer, Hx of bowel obstruction.        ------      **Family History:**    no CAD, no DM.    ------      **Social History:** no Tobacco Use . Seat belt: yes. no Alcohol. no Drug  use. Diet/Nutrition: Two meals a day. Exercise: yes. Marital Status: widowed.  no Domestic violence. Children: 3. Number of household members: 5. Occupation:  Retired Engineer, maintenance (IT). Occup. exposure: none. no Travel outside [place]. no  Caffeine. Home smoke detector use: yes. no Pets.    ------      **Medications:** Taking amitriptyline , Taking pantoprazole 40 mg delayed  release tablet 1 tab(s) orally bid, Taking levothyroxine 125 mcg (0.125 mg)  tablet 1 tab(s) gtube once a day, Notes: Please compound into liquid, Taking  pregabalin 50 mg capsule 1 cap gtube qhs, Notes: Please compound into liquid,  Taking ondansetron 4 mg  tablet, disintegrating 1 tab(s) orally 4 times a day,  Taking pregabalin 50 mg capsule 1 cap(s) orally qday    ------      **Allergies:** N.K.D.A.    ------        **Objective:**        ---      **Vitals:** BP **128/72** , Temp **98.5** , Ht 63, Wt **96** , Wt Change  -5.6 lb, BMI  **17.00** .    ------      **Examination:    ** _General Examination:_    General  NAD, thin  .    Neck, Thyroid :  supple  ,  no thyromegaly  ,  no lymphadenopathy  .    Heart:  RRR  ,  no murmurs  .    Lungs:  clear to auscultation bilaterally  ,  no wheezes/rhonchi/rales  .    Abdomen:  healed surgical scars. no tenderness or masses.  .    Extremities  no edema  .    Peripheral pulses:  normal (2+) bilaterally  .  Skin:  moist, warm  .    Neurologic Exam:  non-focal exam  .        ------            **Assessment:**        ---      **Assessment:**        1\. Failure to thrive in adult - R62.7 (Primary)    2\. Partial intestinal obstruction, unspecified cause - K56.600    3\. 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 Peptic ulcer - K27.9    5\. Malnutrition, unspecified type - E46    6\. Iron deficiency anemia, unspecified iron deficiency anemia type - D50.9    7\. Urinary tract infection without hematuria, site unspecified - N39.0    8\. Restless leg syndrome - G25.81    9\. Hypothyroidism, unspecified type - E03.9    10\. Insomnia, unspecified type - G47.00    ------        **Plan:**        ---        **1\. Failure to thrive in adult**    Notes: no vomiting for past few days. will advance diet. on tube feeds at  night.    ---    **2\. Partial intestinal obstruction, unspecified cause**    Notes: UGI was OK.    **3\. Status post small bowel resection**    Notes: followed by surgeon.    **4\. Peptic ulcer**    _LAB: Basic Metabolic Panel_ Na 131      Value    Reference Range    ---------    Creatinine    0.581      0.550-1.300 - mg/dL    Sodium Lvl    182    L    136-146 - mmol/L    ------------     Potassium Lvl    4.7      3.6-5.2 - mmol/L    ------------    Chloride    98      98-110 - mmol/L    ------------    CO2    26      21-32 - mmol/L    ------------    Calcium Lvl    8.6      8.5-10.5 - mg/dL    ------------    Glucose Lvl    119    H    70-110 - mg/dL    ------------    Anion Gap    7      3-11 -    ------------    BUN    17      8-23 - mg/dL    ------------    _LAB: CBC w/ Indices_ microcytic anemia    Value    Reference Range    ---------    WBC    12.7    H    3.7-11.2 - thous/mm3    RBC    4.16      3.90-5.20 - Mil/mm3    ------------    Hgb    8.5    L    11.8-16.0 - Gm/dL    ------------    Hct    29.7    L    36.0-47.0 - %    ------------    MCV    71.4    L    80.0-100.0 - fL    ------------    MCH    20.4    L  26.0-34.0 - pGm    ------------    MCHC    28.6    L    31.0-37.0 - Gm/dL    ------------    Platelet    552    H    150-400 - thous/mm3    ------------    RDW-SD    48.5      35.0-51.0 - fL    ------------    MPV    11.1      9.4-12.4 - fL    ------------        Notes: had surgery for this. on PPI.    **5\. Malnutrition, unspecified type**    Notes: albumin was low. encourage nutrition.    **6\. Iron deficiency anemia, unspecified iron deficiency anemia type**    Notes: recheck , iron supplements prn.    **7\. Urinary tract infection without hematuria, site unspecified**    _LAB: Urinalysis (Complete)_ pos      Value    Reference Range    ---------    UA Clarity    Turbid    A    Clear -    UA Spec Grav    1.016      1.003-1.030 -    ------------    UA pH    5.0      5.0-8.0 -    ------------    UA Protein    100    A    Negative - mg/dL    ------------    UA Glucose    Negative      Negative -    ------------    UA Ketones    Negative      Negative -    ------------    UA Blood    Small    A    Negative -    ------------    UA Bili     Negative      Negative -    ------------    UA Urobilinogen    Negative      Negative -    ------------    UA Nitrite    Negative      Negative -    ------------    UA Leuk Est    500    A    Negative - WBC/uL    ------------    UA RBC    109    H    0-2 - /HPF    ------------    UA WBC    >182    H    0-5 - /HPF    ------------    UA Bacteria    3+    A    None -    ------------    UA Squamous Epi    <1      0-5 - /HPF    ------------    UA Mucous    Few      None -    ------------    UA WBC Clumps    Many    A    None -    ------------    UA Color    Amber      Yellow -    ------------        Notes: recheck to document clearing. some urinary frequency and incontinence.    **8\. Restless leg syndrome**    Notes: lyrica helps.    **9\. Hypothyroidism, unspecified type**    _LAB: TSH_ suppressed  Value    Reference Range    ---------    3rd Gen TSH    0.334    L    0.358-3.740 - mclU/ml        Notes: recheck and adjust med prn.    **10\. Insomnia, unspecified type**    Notes: amitriptylline 50 mg at night. careful of constipation.    **11\. Others**    Notes: advance diet, encourage hydration, nutrition, activity.      **Follow Up:** 4 Weeks, prn    ------    ---    ---                ---    Electronically signed by Electa Sniff , MD on 06/12/2020 at 04:52 PM EST    Sign off status: Completed          * * *      **Provider:** Electa Sniff, M.D    **Date:** 06/11/2020    ------

## 2020-06-12 ENCOUNTER — Ambulatory Visit (HOSPITAL_BASED_OUTPATIENT_CLINIC_OR_DEPARTMENT_OTHER): Admitting: Family Medicine

## 2020-06-12 NOTE — Progress Notes (Signed)
* * *      CASSIE, SHEDLOCK **DOB:** [birth year] (85 yo F) **Acc No.** [id number] **DOS:**  06/12/2020    ---        [name], [name]**    ------    15 Y old Female, DOB: 02-23-1935    [address], [place], [place] [address]    Home: [phone]    Provider: Cheryle Horsfall        * * *    Telephone Encounter    ---    Answered by    Cheryle Horsfall    Date: 06/12/2020        Time: 01:02 PM    Reason    results    ------            Message                      TSH suppresssed and  UTI.                  Action Taken                      Makyra Corprew G 06/12/2020 1:02:30 PM > decrease levothyroxine to 100 mcg.    tx UTI with bactrim.  discussed with daughter.                Refills    Start Levothyroxine Sodium tablet, 100 mcg (0.1 mg), orally, 60, 1  tab(s), once a day, Refills=0    ------      Start Sulfamethoxazole-Trimethoprim DS tablet, 800 mg-160 mg, orally, 14, 1  tab(s), every 12 hours, Refills=0          * * *                ---          * * *          Provider: Cheryle Horsfall 06/12/2020    ---    Note generated by eClinicalWorks EMR/PM Software ([email or web address])

## 2020-06-14 ENCOUNTER — Ambulatory Visit (HOSPITAL_BASED_OUTPATIENT_CLINIC_OR_DEPARTMENT_OTHER): Admitting: Family Medicine

## 2020-06-15 ENCOUNTER — Ambulatory Visit (HOSPITAL_BASED_OUTPATIENT_CLINIC_OR_DEPARTMENT_OTHER): Admitting: Family Medicine

## 2020-06-17 ENCOUNTER — Ambulatory Visit (HOSPITAL_BASED_OUTPATIENT_CLINIC_OR_DEPARTMENT_OTHER): Admitting: Family Medicine

## 2020-06-17 NOTE — Progress Notes (Signed)
* * *      TEMPLE, EWART **DOB:** 1934/09/09 (85 yo F) **Acc No.** [id number] **DOS:**  06/17/2020    ---        [name], [name]**    ------    19 Y old Female, DOB: 1935-07-17    [address] [place], [place] [address]    Home: [phone]    Provider: Cheryle Horsfall        * * *    Telephone Encounter    ---    Answered by    Maple Hudson LPN, Paulette    Date: 06/17/2020        Time: 01:37 PM    Caller    pt    ------            Message                      calling to find out if you faxed the info to [facility]                 Action Taken                      Phung Kotas G 06/17/2020 1:39:21 PM >  assembled packed to fax  over the weekend.  will fax today or tomorrow      Maple Hudson, LPN ,Paulette  35/78/9784 2:34:08 PM > notified                    * * *                ---          * * *          Provider: Cheryle Horsfall 06/17/2020    ---    Note generated by eClinicalWorks EMR/PM Software ([email or web address])

## 2020-06-19 ENCOUNTER — Ambulatory Visit (HOSPITAL_BASED_OUTPATIENT_CLINIC_OR_DEPARTMENT_OTHER)

## 2020-06-25 ENCOUNTER — Ambulatory Visit

## 2020-06-30 ENCOUNTER — Ambulatory Visit (HOSPITAL_BASED_OUTPATIENT_CLINIC_OR_DEPARTMENT_OTHER): Admitting: Family Medicine

## 2020-07-02 ENCOUNTER — Ambulatory Visit

## 2020-07-03 ENCOUNTER — Ambulatory Visit (HOSPITAL_BASED_OUTPATIENT_CLINIC_OR_DEPARTMENT_OTHER): Admitting: Family Medicine

## 2020-07-03 NOTE — Progress Notes (Signed)
* * *      [name], [name] **DOB:** Jul 09, 1935 (85 yo F) **Acc No.** [id number] **DOS:**  07/03/2020    ---        [name], [name]**    ------    41 Y old Female, DOB: [birth year]    [address] [place], [place] [address]    Home: [phone]    Provider: Cheryle Horsfall        * * *    Telephone Encounter    ---    Answered by    Tori Milks    Date: 07/03/2020        Time: 03:07 PM    Message                      Patient had upper GI recently and a CAT scan yesterday.  She would like the results faxed to DR Ronaldo Miyamoto at [facility] in [place].         ------            Action Taken                      [place], LPN ,[facility]  20/25/4270 3:36:11 PM > did you see these?      Demari Kropp G 07/03/2020 5:21:07 PM >  they are in [employer].  ordered by her surgeon.  pls fax to above MD.      Maple Hudson, LPN ,[facility]  62/37/6283 9:18:39 AM > faxed                    * * *                ---          * * *          Provider: Cheryle Horsfall 07/03/2020    ---    Note generated by eClinicalWorks EMR/PM Software ([email or web address])

## 2020-07-08 ENCOUNTER — Ambulatory Visit (HOSPITAL_BASED_OUTPATIENT_CLINIC_OR_DEPARTMENT_OTHER): Admitting: Family Medicine

## 2020-07-08 NOTE — Progress Notes (Signed)
* * *      [name], [name] **DOB:** 09/16/1934 (85 yo F) **Acc No.** [id number] **DOS:**  07/08/2020    ---        [name], [name]**    ------    81 Y old Female, DOB: 01-05-1935    [address] [place], [place] [address]    Home: [phone]    Provider: Cheryle Horsfall        * * *    Web Encounter    ---    Answered by    Staff, Clinical    Date: 07/08/2020        Time: 10:12 AM    Caller    Chase Caller    ------            Reason    Current upper GI and 12/14 cat scan            Message                      Addressed To: Cheryle Horsfall        I have an appointment 12/23 at [facility] with Dr. Adonis Housekeeper for a 2nd opinion. If you have the results is my recent upper GI and cat scan would you fax them to [facility] as you did my other records. Thank You                Action Taken                      Bronc Brosseau MD  07/08/2020 6:02:35 PM >  we faxed these last Thursday.  PH                    * * *        **eMessages**   From:    [name]    ------    Created:    2020-07-08 18:02:53.0    Sent:      Subject:    UV:QQUIVHO upper GI and 12/14 cat scan    Message:                      Elaine Code MD  07/08/2020 6:02:35 PM >  we faxed these last Thursday.  PH                        ---          * * *          Provider: Cheryle Horsfall 07/08/2020    ---    Note generated by eClinicalWorks EMR/PM Software ([email or web address])

## 2020-07-10 ENCOUNTER — Ambulatory Visit (HOSPITAL_BASED_OUTPATIENT_CLINIC_OR_DEPARTMENT_OTHER): Admitting: Family Medicine

## 2020-07-10 NOTE — Progress Notes (Signed)
* * *      [name], [name] **DOB:** 11-Jun-1935 (85 yo F) **Acc No.** [id number] **DOS:**  07/10/2020    ---        [name], [name]**    ------    7 Y old Female, DOB: 12/29/1934    [address], [place], [place] [address]    Home: [phone]    Provider: Cheryle Horsfall        * * *    Web Encounter    ---    Answered by    Staff, Clinical    Date: 07/10/2020        Time: 10:44 AM    Garcia    [name]    ------            Reason    Re:RE:Current upper GI and 12/14 cat scan            Message                      Addressed To: Cheryle Horsfall                                Thank you for faxing them.                                    _____________________________________________________________________            To :[name]            From :Clinical Staff            Sent :07/08/2020 06:04 PM            Subject :RJ:JOACZYS upper GI and 12/14 cat scan                        Kaitlin Alcindor MD  07/08/2020 6:02:35 PM >  we faxed these last Thursday.  PH                Action Taken                      fernandes,sahrena  07/10/2020 10:57:04 AM >                     * * *                ---          * * *          Provider: Cheryle Horsfall 07/10/2020    ---    Note generated by eClinicalWorks EMR/PM Software ([email or web address])

## 2020-07-22 ENCOUNTER — Ambulatory Visit

## 2020-07-23 ENCOUNTER — Ambulatory Visit (HOSPITAL_BASED_OUTPATIENT_CLINIC_OR_DEPARTMENT_OTHER): Admitting: Family Medicine

## 2020-07-23 NOTE — Progress Notes (Signed)
[name], [name] **DOB:** [birth year] (86 yo F) **Acc No.** [id number] **DOS:**  07/23/2020    ---      ** Progress Notes  **    ---    **Patient:** [name], [name]    **Account Number:** [id number]    **Provider:** Electa Sniff, M.D    **DOB:** Jul 23, 1934  **Age:** 86 Y  **Sex:** Female    **Date:** 07/23/2020    **Phone:** [phone]    **Address:** [address] [place], [id number]        * * *        **Subjective:**        ---      **Chief Complaints:**    ------      1\. 6 wk f/u feeling good no concerns.    ------      **HPI:**    _Interim History_ :    Here in follow up. about the same. vomits after eating. no fever. no pain.  does have BMs. tube feeds working well at night at 50 cc/hr. barium study  showed patent GI tract, has not vomited past few days. . moving bowels. no  fever. back on amitryptilline at night, helps her sleep. lyrica helps with  legs. lab testing : hct 29. sodium 131. thyroid suppressed.    ------      **ROS:**    _CONSTITUTIONAL_ :    Unexplained weight change  none  .  no  Fever.  no  Chills.    _OPTHALMOLOGY_ :    Glasses or contacts  none  .    _ENT_ :    Dental care  up-to-date  .  no  Rhinorrhea. Hoarseness  none  . Swollen Lymph  Nodes  none  .    _ALLERGY_ :    Environmental Allergies  none  .    _RESPIRATORY_ :    Shortness of Breath  none  . Persistent Cough  none  .    _CARDIOLOGY_ :    Chest Pain  none  . Palpitations  none  . Leg Edema  none  . Exertional  symptoms  none  .    _GASTROENTEROLOGY_ :    no  Heartburn.    _FEMALE REPRODUCTIVE_ :    Hot Flashes  none  . Abnormal Vaginal Discharge  none  . Dyspareunia  none  .  Breast lump  none  . Practices SBE  regulary  . Vaginal dryness  none  .    _UROLOGY_ :    Blood in Urine  none  . Urinary Incontinence  none  . Nocturia  None  .  Dysuria  none  .    _MUSCULOSKELETAL_ :    Positive for  OA of hands  .    _NEUROLOGY_ :    Positive for  neuropathy of toes  . Headaches  none  . Weakness  none   .  Tingling/Numbness  none  . Visual Changes  none  . Dizziness  none  .    _PSYCHOLOGY_ :    c/o  Sleep Disturbances.    _DERMATOLOGY_ :    New or changing skin lesions  none  . Positive for  hx of basal cell cancer  .    _HEMATOLOGY/LYMPH_ :    Bleeding tendencies  none  .        ------      **Medical History:** PUD, Carcinoid tumor, Insomnia, Hypothyroid, Basal  cell cancer, Hx of bowel obstruction.        ------      **  Family History:**    no CAD, no DM.    ------      **Social History:** no Tobacco Use . Seat belt: yes. no Alcohol. no Drug  use. Diet/Nutrition: Two meals a day. Exercise: yes. Marital Status: widowed.  no Domestic violence. Children: 3. Number of household members: 5. Occupation:  Retired Engineer, maintenance (IT). Occup. exposure: none. no Travel outside [place]. no  Caffeine. Home smoke detector use: yes. no Pets.    ------      **Medications:** Taking amitriptyline , Taking pantoprazole 40 mg delayed  release tablet 1 tab(s) orally bid, Taking levothyroxine 125 mcg (0.125 mg)  tablet 1 tab(s) gtube once a day, Notes: Please compound into liquid, Taking  ondansetron 4 mg tablet, disintegrating 1 tab(s) orally 4 times a day, Taking  Sulfamethoxazole-Trimethoprim DS 800 mg-160 mg tablet 1 tab(s) orally every 12  hours, Taking pregabalin 50 mg capsule 1 cap(s) orally qday, Taking  levothyroxine 100 mcg (0.1 mg) tablet 1 tab(s) orally once a day    ------        **Objective:**        ---      **Vitals:** BP **132/80** , Temp **98.3** , Ht 63, Wt **93** , Wt Change -3  lb, BMI  **16.47** .    ------      **Examination:    ** _General Examination:_    General  NAD, thin  .    Neck, Thyroid :  supple  ,  no thyromegaly  ,  no lymphadenopathy  .    Heart:  RRR  ,  no murmurs  .    Lungs:  clear to auscultation bilaterally  ,  no wheezes/rhonchi/rales  .    Abdomen:  healed surgical scars. no tenderness or masses.  .    Extremities  no edema  .    Peripheral pulses:  normal (2+) bilaterally  .    Skin:   moist, warm  .    Neurologic Exam:  non-focal exam  .        ------            **Assessment:**        ---      **Assessment:**        1\. Failure to thrive in adult - R62.7 (Primary)    2\. Partial intestinal obstruction, unspecified cause - K56.600    3\. 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 Peptic ulcer - K27.9    5\. Malnutrition, unspecified type - E46    6\. Iron deficiency anemia, unspecified iron deficiency anemia type - D50.9    7\. Hypothyroidism, unspecified type - E03.9    8\. Hyponatremia - E87.1    9\. Restless leg syndrome - G25.81    10\. Insomnia, unspecified type - G47.00    ------        **Plan:**        ---        **1\. Failure to thrive in adult**    Notes: no vomiting for past few days. will advance diet. on tube feeds at  night.    ---    **2\. Partial intestinal obstruction, unspecified cause**    Notes: UGI was OK. now seeing GI at [facility].    **3\. Status post small bowel resection**    Notes: followed by surgeon.    **4\. Peptic ulcer**    Notes: had surgery for this. on PPI.    **5\. Malnutrition, unspecified type**  Notes: albumin was low. encourage nutrition.    **6\. Iron deficiency anemia, unspecified iron deficiency anemia type**    Notes: iron supplements.    **7\. Hypothyroidism, unspecified type**    Notes: thyroid was suppressed and med adjusted.    **8\. Hyponatremia**    Notes: Na 131 avoid free water excess.    **9\. Restless leg syndrome**    Notes: lyrica helps.    **10\. Insomnia, unspecified type**    Notes: amitriptylline 50 mg at night. low dose should not affect gastric  emptying.    **11\. Others**    Notes: advance diet, encourage hydration, nutrition, activity.      **Follow Up:** 4 Weeks, prn    ------    ---    ---                ---    Electronically signed by Electa Sniff , MD on 07/24/2020 at 07:46 AM EST    Sign off status: Completed          * * *      **Provider:** Electa Sniff, M.D    **Date:** 07/23/2020    ------

## 2020-08-08 ENCOUNTER — Ambulatory Visit

## 2020-08-27 ENCOUNTER — Encounter

## 2020-08-27 ENCOUNTER — Ambulatory Visit (HOSPITAL_BASED_OUTPATIENT_CLINIC_OR_DEPARTMENT_OTHER): Admitting: Family Medicine

## 2020-08-27 ENCOUNTER — Ambulatory Visit: Admitting: Family Medicine

## 2020-08-27 LAB — HX CBC W/ INDICES
CASE NUMBER: 2022039001769
HX ABSOLUTE NRBC COUNT: 0 10*3/uL
HX HCT: 33.4 % — ABNORMAL LOW (ref 36.0–47.0)
HX HGB: 9.6 g/dL — ABNORMAL LOW (ref 11.8–16.0)
HX MCH: 20.7 pg — ABNORMAL LOW (ref 26.0–34.0)
HX MCHC: 28.7 g/dL — ABNORMAL LOW (ref 31.0–37.0)
HX MCV: 72.1 fL — ABNORMAL LOW (ref 80.0–100.0)
HX MPV: UNDETERMINED — NL (ref 9.4–12.4)
HX NRBC PERCENT: 0 % — NL
HX PLATELET: 327 10*3/uL — NL (ref 150.0–400.0)
HX RBC: 4.63 10*6/uL — NL (ref 3.9–5.2)
HX RDW-CV: 20.5 % — ABNORMAL HIGH (ref 11.5–14.5)
HX RDW-SD: 53 fL — ABNORMAL HIGH (ref 35.0–51.0)
HX WBC: 9 10*3/uL — NL (ref 3.7–11.2)

## 2020-08-27 LAB — HX IRON PROFILE
CASE NUMBER: 2022039001769
HX % IRON BOUND: 5 % — ABNORMAL LOW (ref 10.0–50.0)
HX FERRITIN LVL: 6 ng/mL — ABNORMAL LOW (ref 11.0–307.0)
HX IRON: 18 ug/dL — ABNORMAL LOW (ref 30.0–160.0)
HX TIBC: 384 ug/dL — NL (ref 250.0–450.0)

## 2020-08-27 LAB — HX TSH REFLEX PANEL (RECOMMENDED)
CASE NUMBER: 2022039001769
HX 3RD GEN TSH: 0.149 u[IU]/mL — ABNORMAL LOW (ref 0.358–3.74)

## 2020-08-27 LAB — HX BASIC METABOLIC PANEL
CASE NUMBER: 2022039001769
HX ANION GAP: 4 — NL (ref 3.0–11.0)
HX BUN: 23 mg/dL — NL (ref 8.0–23.0)
HX CALCIUM LVL: 8.6 mg/dL — NL (ref 8.5–10.5)
HX CHLORIDE: 105 mmol/L — NL (ref 98.0–110.0)
HX CO2: 29 mmol/L — NL (ref 21.0–32.0)
HX CREATININE: 0.553 mg/dL — NL (ref 0.55–1.3)
HX GLUCOSE LVL: 105 mg/dL — NL (ref 70.0–110.0)
HX POTASSIUM LVL: 4.2 mmol/L — NL (ref 3.6–5.2)
HX SODIUM LVL: 138 mmol/L — NL (ref 136.0–146.0)

## 2020-08-27 LAB — HX FREE T4
CASE NUMBER: 2022039001769
HX T4 FREE: 1.48 ng/dL — NL (ref 0.7–1.7)

## 2020-08-27 LAB — HX GLOMERULAR FILTRATION RATE (ESTIMATED)
CASE NUMBER: 2022039001769
HX AFN AMER GLOMERULAR FILTRATION RATE: >90
HX NON-AFN AMER GLOMERULAR FILTRATION RATE: 87 mL/min/{1.73_m2}

## 2020-08-27 NOTE — Progress Notes (Signed)
ANDORA, KRULL **DOB:** November 04, 1934 (86 yo F) **Acc No.** [id number] **DOS:**  08/27/2020    ---      ** Progress Notes  **    ---    **Patient:** [name], [name]    **Account Number:** [id number]    **Provider:** Electa Sniff, M.D    **DOB:** 09-21-1934  **Age:** 86 Y  **Sex:** Female    **Date:** 08/27/2020    **Phone:** [phone]    **Address:** [address], [place], [id number]        * * *        **Subjective:**        ---      **Chief Complaints:**    ------      1\. 1 month fu feels noauseas on the formula Has feeding tube.    ------      **HPI:**    _Interim History_ :    Here in follow up. about the same. vomits after eating. regular nausea. no  fever. no pain. does have BMs. tube feeds working well at night at 50 cc/hr.  barium study showed patent GI tract, vomits every three days. moving bowels.  no fever. back on amitryptilline at night, helps her sleep. lyrica helps with  legs. lab testing : hct 29. sodium 131. thyroid suppressed.    ------      **ROS:**    _CONSTITUTIONAL_ :    Unexplained weight change  none  .  no  Fever.  no  Chills.    _OPTHALMOLOGY_ :    Glasses or contacts  none  .    _ENT_ :    Dental care  up-to-date  .  no  Rhinorrhea. Hoarseness  none  . Swollen Lymph  Nodes  none  .    _ALLERGY_ :    Environmental Allergies  none  .    _RESPIRATORY_ :    Shortness of Breath  none  . Persistent Cough  none  .    _CARDIOLOGY_ :    Chest Pain  none  . Palpitations  none  . Leg Edema  none  . Exertional  symptoms  none  .    _GASTROENTEROLOGY_ :    no  Heartburn.    _FEMALE REPRODUCTIVE_ :    Hot Flashes  none  . Abnormal Vaginal Discharge  none  . Dyspareunia  none  .  Breast lump  none  . Practices SBE  regulary  . Vaginal dryness  none  .    _UROLOGY_ :    Blood in Urine  none  . Urinary Incontinence  none  . Nocturia  None  .  Dysuria  none  .    _MUSCULOSKELETAL_ :    Positive for  OA of hands  .    _NEUROLOGY_ :    Positive for  neuropathy of toes  . Headaches  none  .  Weakness  none  .  Tingling/Numbness  none  . Visual Changes  none  . Dizziness  none  .    _PSYCHOLOGY_ :    c/o  Sleep Disturbances.    _DERMATOLOGY_ :    New or changing skin lesions  none  . Positive for  hx of basal cell cancer  .    _HEMATOLOGY/LYMPH_ :    Bleeding tendencies  none  .        ------      **Medical History:** PUD, Carcinoid tumor, Insomnia, Hypothyroid, Basal  cell cancer, Hx of bowel obstruction.        ------      **  Surgical History:** PUD , partial colectomy 2020 ,  BowelResection(N/A)(03/12/2020) pt also reports with revision 03/12/2020,  EGDwithAnesthesia(N/A)(04/05/2020 04/05/2020.    ------      **Hospitalization/Major Diagnostic Procedure:** bowel obstruction 02/20/2020.    ------      **Family History:**    no CAD, no DM.    ------      **Social History:** no Tobacco Use . Seat belt: yes. no Alcohol. no Drug  use. Diet/Nutrition: Two meals a day. Exercise: yes. Marital Status: widowed.  no Domestic violence. Children: 3. Number of household members: 5. Occupation:  Retired Engineer, maintenance (IT). Occup. exposure: none. no Travel outside [place]. no  Caffeine. Home smoke detector use: yes. no Pets.    ------      **Medications:** Taking amitriptyline , Taking pantoprazole 40 mg delayed  release tablet 1 tab(s) orally bid, Taking ondansetron 4 mg tablet,  disintegrating 1 tab(s) orally 4 times a day, Taking pregabalin 50 mg capsule  1 cap(s) orally qday, Taking levothyroxine 100 mcg (0.1 mg) tablet 1 tab(s)  orally once a day, Discontinued levothyroxine 125 mcg (0.125 mg) tablet 1  tab(s) gtube once a day, Notes: Please compound into liquid, Discontinued  Sulfamethoxazole-Trimethoprim DS 800 mg-160 mg tablet 1 tab(s) orally every 12  hours    ------      **Allergies:** N.K.D.A.    ------        **Objective:**        ---      **Vitals:** BP **120/80** , Temp **98.2** , Ht 63, Wt **91.4** , Wt Change  -1.6 lb, BMI  **16.19** .    ------      **Examination:    ** _General Examination:_     General  NAD, thin  .    Neck, Thyroid :  supple  ,  no thyromegaly  ,  no lymphadenopathy  .    Heart:  RRR  ,  no murmurs  .    Lungs:  clear to auscultation bilaterally  ,  no wheezes/rhonchi/rales  .    Abdomen:  healed surgical scars. no tenderness or masses.  .    Extremities  no edema  .    Peripheral pulses:  normal (2+) bilaterally  .    Skin:  moist, warm  .    Neurologic Exam:  non-focal exam  .        ------            **Assessment:**        ---      **Assessment:**        1\. Failure to thrive in adult - R62.7 (Primary)    2\. Partial intestinal obstruction, unspecified cause - K56.600    3\. 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 Peptic ulcer - K27.9    5\. Malnutrition, unspecified type - E46    6\. Iron deficiency anemia, unspecified iron deficiency anemia type - D50.9    7\. Hypothyroidism, unspecified type - E03.9    8\. Hyponatremia - E87.1    9\. Restless leg syndrome - G25.81    10\. Insomnia, unspecified type - G47.00    ------        **Plan:**        ---        **1\. Failure to thrive in adult**    _LAB: Basic Metabolic Panel_     Value    Reference Range    ---------    Creatinine    0.553  0.550-1.300 - mg/dL    Sodium Lvl    709      136-146 - mmol/L    ------------    Potassium Lvl    4.2      3.6-5.2 - mmol/L    ------------    Chloride    105      98-110 - mmol/L    ------------    CO2    29      21-32 - mmol/L    ------------    Calcium Lvl    8.6      8.5-10.5 - mg/dL    ------------    Glucose Lvl    105      70-110 - mg/dL    ------------    Anion Gap    4      3-11 -    ------------    BUN    23      8-23 - mg/dL    ------------    _LAB: CBC w/ Indices_   Value    Reference Range    ---------    WBC    9.0      3.7-11.2 - thous/mm3    RBC    4.63      3.90-5.20 - Mil/mm3    ------------    Hgb    9.6    L    11.8-16.0 - Gm/dL    ------------    Hct    33.4    L    36.0-47.0 - %     ------------    MCV    72.1    L    80.0-100.0 - fL    ------------    MCH    20.7    L    26.0-34.0 - pGm    ------------    MCHC    28.7    L    31.0-37.0 - Gm/dL    ------------    Platelet    327      150-400 - thous/mm3    ------------    RDW-SD    53.0    H    35.0-51.0 - fL    ------------    MPV    Unable to Perform      9.4-12.4 -    ------------    _LAB: Iron Profile_   Value    Reference Range    ---------    TIBC    384      250-450 - mcg/dL    % Iron Bound    5    L    10-50 - %    ------------    Ferritin Lvl    6    L    11-307 - nGm/ml    ------------    Iron    18    L    30-160 - mcg/dL    ------------    _LAB: TSH Reflex Panel (Recommended)_   Value    Reference Range    ---------    3rd Gen TSH    0.149    L    0.358-3.740 - mclU/ml        Notes: no vomiting for past few days. will advance diet. on tube feeds at  night.    **2\. Partial intestinal obstruction, unspecified cause**    Notes: UGI was OK. now seeing GI at [facility].    **3\. Status post small bowel resection**    Notes: followed by surgeon.    **  4\. Peptic ulcer**    Notes: had surgery for this. on PPI.    **5\. Malnutrition, unspecified type**    Notes: albumin was low. encourage nutrition.    **6\. Iron deficiency anemia, unspecified iron deficiency anemia type**    Notes: iron supplements.    **7\. Hypothyroidism, unspecified type**    Notes: thyroid was suppressed and med adjusted.    **8\. Hyponatremia**    Notes: Na 131 avoid free water excess.    **9\. Restless leg syndrome**    Notes: lyrica helps.    **10\. Insomnia, unspecified type**    Notes: amitriptylline 50 mg at night. low dose should not affect gastric  emptying.    **11\. Others**    Notes: advance diet, encourage hydration, nutrition, activity.      **Follow Up:** 4 Weeks, prn    ------    ---    ---                ---    Electronically signed by Electa Sniff , MD on 08/27/2020 at 05:55 PM EST     Sign off status: Completed          * * *      **Provider:** Electa Sniff, M.D    **Date:** 08/27/2020    ------

## 2020-08-27 NOTE — Progress Notes (Signed)
* * *      [name], [name] **DOB:** [birth year] (86 yo F) **Acc No.** [id number] **DOS:**  08/27/2020    ---        [name], [name]**    ------    45 Y old Female, DOB: January 20, 1935    [address], [place], [place] [address]    Home: [phone]    Provider: Cheryle Horsfall        * * *    Telephone Encounter    ---    Answered by    Cheryle Horsfall    Date: 08/27/2020        Time: 06:00 PM    Reason    results    ------            Message                      TSH suppressed.    hct better.   sodium normal.   still iron deficient                Action Taken                      Richard Ritchey G 08/27/2020 6:00:33 PM >  lets cut back to 88 mcg daily.   erx done                Refills    Start Levothyroxine Sodium tablet, 88 mcg (0.088 mg), orally, 30,  1 tab(s), once a day, Refills=1    ------          * * *                ---          * * *          Provider: Cheryle Horsfall 08/27/2020    ---    Note generated by eClinicalWorks EMR/PM Software ([email or web address])

## 2020-08-27 NOTE — Progress Notes (Signed)
* * *      FESLER, [place] **DOB:** July 22, 1934 (86 yo F) **Acc No.** [id number] **DOS:**  08/27/2020    ---        [name], [name]**    ------    62 Y old Female, DOB: [birth year]    [address] [place], [place] [address]    Home: [phone]    Provider: Cheryle Horsfall        * * *    Telephone Encounter    ---    Answered by    Cheryle Horsfall    Date: 08/27/2020        Time: 11:30 AM    Message                      zofran        ------            Action Taken                      [name] 08/27/2020 11:31:05 AM > erx done                Refills    Start Ondansetron Hydrochloride tablet, 4 mg, orally, 30, 1  tab(s), every 8 hours prn nausea, Refills=4    ------          * * *                ---          * * *          Provider: Cheryle Horsfall 08/27/2020    ---    Note generated by eClinicalWorks EMR/PM Software ([email or web address])

## 2020-09-04 ENCOUNTER — Ambulatory Visit (HOSPITAL_BASED_OUTPATIENT_CLINIC_OR_DEPARTMENT_OTHER): Admitting: Family Medicine

## 2020-09-04 NOTE — Progress Notes (Signed)
* * *      [name], [name] **DOB:** Oct 10, 1934 (86 yo F) **Acc No.** [id number] **DOS:**  09/04/2020    ---        [name], [name]**    ------    60 Y old Female, DOB: April 05, 1935    [address], [place], [place] [address]    Home: [phone]    Provider: Cheryle Horsfall        * * *    Telephone Encounter    ---    Answered by    Maple Hudson LPN, Paulette    Date: 09/04/2020        Time: 10:08 AM    Caller    pt    ------            Reason    Refills            Action Taken                      Kamdon Reisig G 09/04/2020 11:41:24 AM >  erx done                Refills    Refill pantoprazole delayed release tablet, 40 mg, orally, 180, 1  tab(s), bid, Refills=1    ------          * * *              * * *        ---        Reason for Appointment    ---      1\. Refills    ---      Current Medications    ---    Unknown      * amitriptyline     ---    * pantoprazole 40 mg delayed release tablet 1 tab(s) orally bid     ---    * ondansetron 4 mg tablet, disintegrating 1 tab(s) orally 4 times a day     ---    * pregabalin 50 mg capsule 1 cap(s) orally qday     ---    * levothyroxine 100 mcg (0.1 mg) tablet 1 tab(s) orally once a day     ---    * Ondansetron Hydrochloride 4 mg tablet 1 tab(s) orally every 8 hours prn nausea     ---    * Levothyroxine Sodium 88 mcg (0.088 mg) tablet 1 tab(s) orally once a day     ---      Treatment    ---      **1\. Others**    Refill pantoprazole delayed release tablet, 40 mg, 1 tab(s), orally, bid, 180,  Refills 1    ---          * * *          Provider: Cheryle Horsfall 09/04/2020    ---    Note generated by eClinicalWorks EMR/PM Software ([email or web address])

## 2020-09-19 ENCOUNTER — Ambulatory Visit (HOSPITAL_BASED_OUTPATIENT_CLINIC_OR_DEPARTMENT_OTHER): Admitting: Family Medicine

## 2020-09-26 ENCOUNTER — Ambulatory Visit (HOSPITAL_BASED_OUTPATIENT_CLINIC_OR_DEPARTMENT_OTHER): Admitting: Family Medicine

## 2020-09-26 NOTE — Progress Notes (Signed)
* * *      [name], [name] **DOB:** 1934/12/10 (86 yo F) **Acc No.** [id number] **DOS:**  09/26/2020    ---        [name], [name]**    ------    48 Y old Female, DOB: 07/04/1935    [address] [place], [place] [address]    Home: [phone]    Provider: Cheryle Horsfall        * * *    Telephone Encounter    ---    Answered by    [name], [name]    Date: 09/26/2020        Time: 02:27 PM    Message                      daughter Waynetta Sandy is cincerned about mom and losing weight she would like a call back at [phone]        ------            Action Taken                      Young, Garcia ,[name]  5/32/9924 3:10:45 PM > called no answer lvm with daughter should be seen                    * * *                ---          * * *          Provider: Cheryle Horsfall 09/26/2020    ---    Note generated by eClinicalWorks EMR/PM Software ([email or web address])

## 2020-10-01 ENCOUNTER — Ambulatory Visit (HOSPITAL_BASED_OUTPATIENT_CLINIC_OR_DEPARTMENT_OTHER): Admitting: Family Medicine

## 2020-10-01 ENCOUNTER — Ambulatory Visit

## 2020-10-01 ENCOUNTER — Ambulatory Visit: Admitting: Family Medicine

## 2020-10-01 LAB — HX GLOMERULAR FILTRATION RATE (ESTIMATED)
CASE NUMBER: 2022074001801
HX AFN AMER GLOMERULAR FILTRATION RATE: >90
HX NON-AFN AMER GLOMERULAR FILTRATION RATE: 79 mL/min/{1.73_m2}

## 2020-10-01 LAB — HX IRON PROFILE
CASE NUMBER: 2022074001801
HX % IRON BOUND: 9 % — ABNORMAL LOW (ref 10.0–50.0)
HX FERRITIN LVL: 7 ng/mL — ABNORMAL LOW (ref 11.0–307.0)
HX IRON: 41 ug/dL — NL (ref 30.0–160.0)
HX TIBC: 434 ug/dL — NL (ref 250.0–450.0)

## 2020-10-01 LAB — HX CBC W/ INDICES
CASE NUMBER: 2022074001801
HX ABSOLUTE NRBC COUNT: 0 10*3/uL
HX HCT: 36.5 % — NL (ref 36.0–47.0)
HX HGB: 10.7 g/dL — ABNORMAL LOW (ref 11.8–16.0)
HX MCH: 21.8 pg — ABNORMAL LOW (ref 26.0–34.0)
HX MCHC: 29.3 g/dL — ABNORMAL LOW (ref 31.0–37.0)
HX MCV: 74.3 fL — ABNORMAL LOW (ref 80.0–100.0)
HX MPV: 12 fL — NL (ref 9.4–12.4)
HX NRBC PERCENT: 0 % — NL
HX PLATELET: 353 10*3/uL — NL (ref 150.0–400.0)
HX RBC: 4.91 10*6/uL — NL (ref 3.9–5.2)
HX RDW-CV: 19.3 % — ABNORMAL HIGH (ref 11.5–14.5)
HX RDW-SD: 51 fL — NL (ref 35.0–51.0)
HX WBC: 10.1 10*3/uL — NL (ref 3.7–11.2)

## 2020-10-01 LAB — HX FREE T4
CASE NUMBER: 2022074001801
HX T4 FREE: 1.33 ng/dL — NL (ref 0.7–1.7)

## 2020-10-01 LAB — HX BASIC METABOLIC PANEL
CASE NUMBER: 2022074001801
HX ANION GAP: 5 — NL (ref 3.0–11.0)
HX BUN: 21 mg/dL — NL (ref 8.0–23.0)
HX CALCIUM LVL: 9.4 mg/dL — NL (ref 8.5–10.5)
HX CHLORIDE: 105 mmol/L — NL (ref 98.0–110.0)
HX CO2: 28 mmol/L — NL (ref 21.0–32.0)
HX CREATININE: 0.711 mg/dL — NL (ref 0.55–1.3)
HX GLUCOSE LVL: 106 mg/dL — NL (ref 70.0–110.0)
HX POTASSIUM LVL: 4.6 mmol/L — NL (ref 3.6–5.2)
HX SODIUM LVL: 138 mmol/L — NL (ref 136.0–146.0)

## 2020-10-01 LAB — HX TSH REFLEX PANEL (RECOMMENDED)
CASE NUMBER: 2022074001801
HX 3RD GEN TSH: 0.213 u[IU]/mL — ABNORMAL LOW (ref 0.358–3.74)

## 2020-11-08 ENCOUNTER — Other Ambulatory Visit (INDEPENDENT_AMBULATORY_CARE_PROVIDER_SITE_OTHER): Admitting: Family Medicine

## 2020-11-08 MED ORDER — amitriptyline (Elavil) 50 mg tablet
50 | ORAL_TABLET | Freq: Every evening | ORAL | 1 refills | Status: DC
Start: 2020-11-08 — End: 2021-07-02

## 2020-11-08 MED ORDER — pregabalin (Lyrica) 50 mg capsule
50 | ORAL_CAPSULE | Freq: Every day | ORAL | 3 refills | Status: DC
Start: 2020-11-08 — End: 2021-03-17

## 2020-11-08 NOTE — Progress Notes (Signed)
Refills:  Sent amitryptilline to [facility] and lyrica to [facility]

## 2020-11-18 ENCOUNTER — Encounter

## 2020-11-20 ENCOUNTER — Encounter (INDEPENDENT_AMBULATORY_CARE_PROVIDER_SITE_OTHER): Admitting: Surgery

## 2020-11-20 ENCOUNTER — Other Ambulatory Visit

## 2020-11-20 ENCOUNTER — Ambulatory Visit
Admit: 2020-11-20 | Discharge: 2020-11-20 | Payer: PRIVATE HEALTH INSURANCE | Attending: Surgery | Primary: Family Medicine

## 2020-11-20 VITALS — BP 120/74 | Temp 97.2°F | Ht 63.0 in | Wt 93.0 lb

## 2020-11-20 DIAGNOSIS — Z903 Acquired absence of stomach [part of]: Secondary | ICD-10-CM

## 2020-11-20 NOTE — Progress Notes (Signed)
Patient is known to me. She was diagnosed with near complete obstruction at the level of the antrum secondary to peptic ulcer disease. She underwent antrectomy with Billroth II anastomosis on February 20, 2020. She went home a week later, only to be readmitted because of symptoms of bowel obstruction. She was treated medically, but did not progress well and she ended up with redo of the anastomosis as well as jejunostomy tube insertion August 24.  She had a very long and protracted recovery mostly because of intolerance of by mouth intake as well as intolerance to tube feeds. She ultimately was tolerating tube feeding around-the-clock and was discharged home on October 2.    At home she was on combination of p.o. intake and tube feeds.  However she kept having problems tolerating p.o. intake as well as tube feeds.  Thorough work-up was negative.  She ultimately went to [facility], saw a gastroenterologist there, started on Motegrity.    Since then she has had immense improvement.  She has not needed any tube feeding at all for a couple of months now.  Tolerating p.o.  She has gained almost 8 pounds.  No GI symptoms.        Alert awake and oriented.  Abdomen soft no tenderness no distention.  Jejunostomy tube removed, dressing applied.          Assessment:        Assessment:     1. History of partial gastrectomy - Z90.3 (Primary)     Upper GI with small bowel follow-through done June 25, 2020. Free passage of contrast through the stomach and proximal jejunum, slowed down in the distal jejunum.  CT scan of the abdomen and pelvis done on July 02, 2020 reviewed, negative.     I told the patient I am extremely happy she is completely free of any nausea and vomiting and tolerating p.o. intake well.  No need for any further intervention from my standpoint.  Remove dressing daily, reapply if wet.  I suspect the wound will completely dry and epithelialized in the next few days.  If not completely closed by next week  she will give me a call.  She has my cell phone number.    Otherwise I will see her as needed

## 2020-11-27 ENCOUNTER — Encounter (INDEPENDENT_AMBULATORY_CARE_PROVIDER_SITE_OTHER): Admitting: Family Medicine

## 2020-12-21 ENCOUNTER — Encounter (INDEPENDENT_AMBULATORY_CARE_PROVIDER_SITE_OTHER)

## 2020-12-24 ENCOUNTER — Encounter (INDEPENDENT_AMBULATORY_CARE_PROVIDER_SITE_OTHER): Admitting: Family Medicine

## 2020-12-24 ENCOUNTER — Other Ambulatory Visit

## 2020-12-24 ENCOUNTER — Ambulatory Visit: Admit: 2020-12-24 | Discharge: 2020-12-24 | Payer: MEDICARE | Attending: Family Medicine | Primary: Family Medicine

## 2020-12-24 ENCOUNTER — Other Ambulatory Visit: Admit: 2020-12-24 | Payer: MEDICARE | Primary: Family Medicine

## 2020-12-24 ENCOUNTER — Other Ambulatory Visit (INDEPENDENT_AMBULATORY_CARE_PROVIDER_SITE_OTHER): Admitting: Family Medicine

## 2020-12-24 VITALS — BP 110/60 | HR 90 | Ht 63.0 in | Wt 92.0 lb

## 2020-12-24 DIAGNOSIS — E039 Hypothyroidism, unspecified: Secondary | ICD-10-CM

## 2020-12-24 DIAGNOSIS — K311 Adult hypertrophic pyloric stenosis: Secondary | ICD-10-CM

## 2020-12-24 LAB — CBC WITH DIFFERENTIAL
Basophils %: 0.4 %
Basophils Absolute: 0.03 10*3/uL (ref 0.00–0.22)
Eosinophils %: 1.4 %
Eosinophils Absolute: 0.1 10*3/uL (ref 0.00–0.50)
Hematocrit: 34.5 % (ref 32.0–47.0)
Hemoglobin: 9.9 g/dL — ABNORMAL LOW (ref 11.0–16.0)
Immature Granulocytes %: 0.3 %
Immature Granulocytes Absolute: 0.02 10*3/uL (ref 0.00–0.10)
Lymphocyte %: 25.6 %
Lymphocytes Absolute: 1.87 10*3/uL (ref 0.70–4.00)
MCH: 22.7 pg — ABNORMAL LOW (ref 26.0–34.0)
MCHC: 28.7 g/dL — ABNORMAL LOW (ref 31.0–37.0)
MCV: 79.1 fL — ABNORMAL LOW (ref 80.0–100.0)
Monocytes %: 11.6 %
Monocytes Absolute: 0.85 10*3/uL — ABNORMAL HIGH (ref 0.36–0.77)
NRBC %: 0 % (ref 0.0–0.0)
NRBC Absolute: 0 10*3/uL (ref 0.00–2.00)
Neutrophil %: 60.7 %
Neutrophils Absolute: 4.43 10*3/uL (ref 1.50–7.95)
Platelets: 326 10*3/uL (ref 150–400)
RBC: 4.36 M/uL (ref 3.70–5.20)
RDW-CV: 16.7 % — ABNORMAL HIGH (ref 11.5–14.5)
RDW-SD: 47.9 fL (ref 35.0–51.0)
WBC: 7.3 10*3/uL (ref 4.0–11.0)

## 2020-12-24 LAB — BASIC METABOLIC PANEL
Anion Gap: 6 mmol/L (ref 3–14)
BUN: 24 mg/dL (ref 6–24)
CO2 (Bicarbonate): 26 mmol/L (ref 20–32)
Calcium: 8.8 mg/dL (ref 8.5–10.5)
Chloride: 106 mmol/L (ref 98–110)
Creatinine: 0.55 mg/dL (ref 0.55–1.30)
Glucose: 83 mg/dL (ref 70–110)
Potassium: 4.4 mmol/L (ref 3.6–5.2)
Sodium: 138 mmol/L (ref 135–146)
eGFRcr: 90 mL/min/{1.73_m2} (ref >=60)

## 2020-12-24 LAB — RBC MORPHOLOGY

## 2020-12-24 LAB — TSH: TSH: 9.35 u[IU]/mL — ABNORMAL HIGH (ref 0.358–3.740)

## 2020-12-24 MED ORDER — levothyroxine (Synthroid, Levoxyl) 50 mcg tablet
50 | ORAL_TABLET | Freq: Every day | ORAL | 1 refills | 90.00000 days | Status: DC
Start: 2020-12-24 — End: 2021-09-15

## 2020-12-24 MED ORDER — mirtazapine (Remeron) 15 mg tablet
15 | ORAL_TABLET | Freq: Every evening | ORAL | 1 refills | 28.00000 days | Status: DC
Start: 2020-12-24 — End: 2021-01-17

## 2020-12-24 NOTE — Progress Notes (Signed)
[facility]  [facility]  [address]  [address]  [place] [address]  Dept: [phone]  Dept Fax: [phone]     Patient ID: [name] is a 86 y.o. female who presents for Follow-up (Pt states she is not taking thyroid medication. Is anxious to get labs to see if she needs it again. She states amitriptyline goes to [facility]. [facility] [address] [place]    Subjective   HPI   Here in follow up.   Has finally gained weight.   Up to 92 pounds.   No vomiting.      Still some headaches.   Depressed, feels she doesn't belong where she is staying with daughter.           No diarrhea.    No abdominal pain.      Current Outpatient Medications   Medication Instructions   ? amitriptyline (ELAVIL) 100 mg, oral, Nightly   ? mirtazapine (REMERON) 7.5 mg, oral, Nightly   ? ondansetron (ZOFRAN) 4 mg, oral, Every 8 hours PRN   ? pantoprazole (PROTONIX) 40 mg, oral, 2 times daily   ? pregabalin (LYRICA) 50 mg, oral, Daily     Review of Systems   Constitutional: Negative.    HENT: Negative.    Eyes: Negative.    Respiratory: Negative.    Cardiovascular: Negative.    Gastrointestinal: Negative.    Genitourinary: Negative.    Musculoskeletal: Positive for joint pain.   Skin: Negative.    Neurological: Negative.    Psychiatric/Behavioral: Positive for depression.     Objective   Visit Vitals  BP 110/60 (BP Location: Right arm, Patient Position: Sitting, BP Cuff Size: Child)   Pulse 90   Ht 1.6 m   Wt 41.7 kg   SpO2 96%   BMI 16.30 kg/m?   BSA 1.36 m?       Physical Exam  BP 110/60 (BP Location: Right arm, Patient Position: Sitting, BP Cuff Size: Child)   Pulse 90   Ht 1.6 m   Wt 41.7 kg   SpO2 96%   BMI 16.30 kg/m?     General Appearance:    Alert, cooperative, no distress, appears stated age   Head:    Normocephalic, without obvious abnormality, atraumatic   Eyes:    PERRL, conjunctiva/corneas clear, EOM's intact, fundi     benign,  both eyes   Ears:    Normal TM's and external ear canals, both ears   Nose:   Nares normal, septum midline, mucosa normal, no drainage     or sinus tenderness   Throat:   Lips, mucosa, and tongue normal; teeth and gums normal   Neck:   Supple, symmetrical, trachea midline, no adenopathy;     thyroid:  no enlargement/tenderness/nodules; no carotid    bruit or JVD   Back:     Symmetric, no curvature, ROM normal, no CVA tenderness   Lungs:     Clear to auscultation bilaterally, respirations unlabored   Chest Wall:    No tenderness or deformity    Heart:    Regular rate and rhythm, S1 and S2 normal, no murmur, rub    or gallop   Breast Exam:       Abdomen:     Soft, non-tender, bowel sounds active all four quadrants,     no masses, no organomegaly   Genitalia:     Rectal:  Extremities:   Extremities normal, atraumatic, no cyanosis or edema   Pulses:   2+ and symmetric all extremities   Skin:   Skin color, texture, turgor normal, no rashes or lesions   Lymph nodes:   Cervical, supraclavicular, and axillary nodes normal   Neurologic:   CNII-XII intact, normal strength, sensation and reflexes     throughout     Assessment/Plan   [name] was seen today for follow-up.  Gastric outlet obstruction  Comments:  better.  off motigrety.  no vomiting.    Failure to thrive in adult  Comments:  finally gaining weight. encourage nutrition  Peptic ulcer disease  Comments:  on PPI.  had several gastric surgeries  Orders:  -     Basic metabolic panel; Future  Depression with anxiety  Comments:  can restart remeron  Psychophysiological insomnia  Comments:  better on amitryptilline  Acquired hypothyroidism  Comments:  recheck.  off thyroid med for now  Orders:  -     TSH; Future  Iron deficiency anemia, unspecified iron deficiency anemia type  Comments:  recheck.  nutrition better.    Orders:  -     CBC and differential - W[facility] and non-WAM; Future

## 2021-01-06 ENCOUNTER — Encounter (INDEPENDENT_AMBULATORY_CARE_PROVIDER_SITE_OTHER): Admitting: Family Medicine

## 2021-01-17 ENCOUNTER — Other Ambulatory Visit (INDEPENDENT_AMBULATORY_CARE_PROVIDER_SITE_OTHER): Admitting: Family Medicine

## 2021-01-28 ENCOUNTER — Encounter (INDEPENDENT_AMBULATORY_CARE_PROVIDER_SITE_OTHER): Admitting: Family Medicine

## 2021-02-09 ENCOUNTER — Other Ambulatory Visit (INDEPENDENT_AMBULATORY_CARE_PROVIDER_SITE_OTHER): Admitting: Family Medicine

## 2021-02-27 ENCOUNTER — Other Ambulatory Visit (INDEPENDENT_AMBULATORY_CARE_PROVIDER_SITE_OTHER): Admitting: Family Medicine

## 2021-03-06 ENCOUNTER — Other Ambulatory Visit (INDEPENDENT_AMBULATORY_CARE_PROVIDER_SITE_OTHER): Admitting: Family Medicine

## 2021-03-12 ENCOUNTER — Other Ambulatory Visit (INDEPENDENT_AMBULATORY_CARE_PROVIDER_SITE_OTHER): Admitting: Family Medicine

## 2021-03-17 ENCOUNTER — Other Ambulatory Visit (INDEPENDENT_AMBULATORY_CARE_PROVIDER_SITE_OTHER)

## 2021-03-17 MED ORDER — pregabalin (Lyrica) 50 mg capsule
50 | ORAL_CAPSULE | Freq: Every day | ORAL | 3 refills | Status: DC
Start: 2021-03-17 — End: 2021-03-20

## 2021-03-20 ENCOUNTER — Other Ambulatory Visit (INDEPENDENT_AMBULATORY_CARE_PROVIDER_SITE_OTHER): Admitting: Family Medicine

## 2021-03-20 ENCOUNTER — Telehealth (INDEPENDENT_AMBULATORY_CARE_PROVIDER_SITE_OTHER)

## 2021-03-20 MED ORDER — pregabalin (Lyrica) 50 mg capsule
50 | ORAL_CAPSULE | Freq: Every day | ORAL | 3 refills | Status: DC
Start: 2021-03-20 — End: 2021-07-21

## 2021-03-20 NOTE — Telephone Encounter (Signed)
 It was sent 8/29 to [facility].   Epic says it went through then.     [facility]

## 2021-03-20 NOTE — Telephone Encounter (Signed)
 I called the pharmacy abd they have not gotten her script for pregablin

## 2021-04-01 ENCOUNTER — Other Ambulatory Visit (INDEPENDENT_AMBULATORY_CARE_PROVIDER_SITE_OTHER): Admitting: Family Medicine

## 2021-04-26 ENCOUNTER — Other Ambulatory Visit (INDEPENDENT_AMBULATORY_CARE_PROVIDER_SITE_OTHER): Admitting: Family Medicine

## 2021-05-12 ENCOUNTER — Inpatient Hospital Stay: Payer: MEDICARE | Primary: Family Medicine

## 2021-05-12 ENCOUNTER — Other Ambulatory Visit (INDEPENDENT_AMBULATORY_CARE_PROVIDER_SITE_OTHER): Admitting: Family Medicine

## 2021-05-12 ENCOUNTER — Other Ambulatory Visit

## 2021-05-12 ENCOUNTER — Inpatient Hospital Stay
Admit: 2021-05-12 | Disposition: A | Discharge status: Home / Self Care | Payer: MEDICARE | Attending: Family Medicine | Primary: Family Medicine

## 2021-05-12 ENCOUNTER — Telehealth (INDEPENDENT_AMBULATORY_CARE_PROVIDER_SITE_OTHER): Admitting: Family Medicine

## 2021-05-12 ENCOUNTER — Encounter (INDEPENDENT_AMBULATORY_CARE_PROVIDER_SITE_OTHER)

## 2021-05-12 VITALS — BP 135/63 | HR 75 | Temp 97.9°F | Resp 18 | Ht 63.0 in | Wt 98.0 lb

## 2021-05-12 DIAGNOSIS — J029 Acute pharyngitis, unspecified: Secondary | ICD-10-CM

## 2021-05-12 LAB — POCT SARS-COV-2/FLU A&B
POCT Flu A: NOT DETECTED
POCT Flu A: NOT DETECTED
POCT Flu B: NOT DETECTED
POCT Flu B: NOT DETECTED
POCT SARS-CoV-2: NOT DETECTED
POCT SARS-CoV-2: NOT DETECTED

## 2021-05-12 LAB — POCT SARS COVID INF A & B
POCT COVID-19 SARS: NEGATIVE
POCT Influenza A Ag: NEGATIVE
POCT Influenza B Ag: NEGATIVE

## 2021-05-12 LAB — POCT STREP A PCR: POCT Strep A PCR: NOT DETECTED

## 2021-05-12 LAB — POCT RAPID STREP A: POCT Strep A Ag: NEGATIVE

## 2021-05-12 MED ORDER — amoxicillin (Amoxil) 500 mg capsule
500 | ORAL_CAPSULE | Freq: Two times a day (BID) | ORAL | 0 refills | Status: AC
Start: 2021-05-12 — End: 2021-05-22

## 2021-05-12 NOTE — Discharge Instructions (Signed)
 You were seen and evaluated here today for symptoms suggestive of viral upper respiratory infection.  Your Covid and flu test were negative here today.  Please continue to take over-the-counter medications to treat your symptoms.  Please seek immediate reevaluation with any new or worsening symptoms.

## 2021-05-12 NOTE — Telephone Encounter (Signed)
 Spoke to pt, she states her grandson tested positive for strep on Wednesday, she is concerned she caught it from him.   Home COVID test negative.  Wondering can something be called in for her, to [facility].

## 2021-05-12 NOTE — Telephone Encounter (Signed)
Sent amoxicillin to her pharmacy

## 2021-05-12 NOTE — ED Provider Notes (Signed)
 History  Chief Complaint   Patient presents with   . Sore Throat     Patient states she has a sore throat, watery eyes and ear pain x 2 days.  Her Lucila Maine is Strep Positive, she is worried she has strep.  She called PCP and was told to come here.     86 year old female presents for evaluation of sore throat, watery eyes, ear pain for the past 2 days.  Reports that her grandson is strep positive.  Also has a very mild runny nose.  No shortness of breath or chest pain.  Has been eating and drinking normally.  Has not tried anything for symptoms.          Past Medical History:   Diagnosis Date   . Hx of carcinoid syndrome    . Hypothyroid    . Insomnia    . Iron deficiency anemia    . Neuropathy    . Peptic ulcer disease    . Skin cancer, basal cell        Past Surgical History:   Procedure Laterality Date   . COLON SURGERY  2020    partial       No family history on file.    Social History     Tobacco Use   . Smoking status: Never   . Smokeless tobacco: Never   Vaping Use   . Vaping Use: Never used   Substance Use Topics   . Alcohol use: Not Currently   . Drug use: Never       Review of Systems   Gastrointestinal: Negative for abdominal pain, constipation, diarrhea and vomiting.   Genitourinary: Negative for dysuria.   Musculoskeletal: Negative for neck stiffness.   Neurological: Negative for headaches.       Physical Exam  BP 135/63 (BP Location: Right arm, Patient Position: Sitting)   Pulse 75   Temp 36.6 C (97.9 F)   Resp 18   Ht 1.6 m   Wt 44.5 kg   SpO2 98%   BMI 17.36 kg/m     Physical Exam  Constitutional:       General: She is not in acute distress.     Appearance: Normal appearance.   HENT:      Mouth/Throat:      Mouth: Mucous membranes are moist.      Pharynx: Posterior oropharyngeal erythema present. No oropharyngeal exudate.   Cardiovascular:      Rate and Rhythm: Normal rate and regular rhythm.      Pulses: Normal pulses.      Heart sounds: Normal heart sounds.   Pulmonary:      Effort:  Pulmonary effort is normal. No respiratory distress.      Breath sounds: Normal breath sounds. No stridor. No wheezing, rhonchi or rales.   Musculoskeletal:      Cervical back: Normal range of motion and neck supple.   Neurological:      Mental Status: She is alert.              No orders to display     Labs Reviewed   POCT SARS COVID INF A & B - Normal       Result Value    COVID-19 SARS POC Negative      Influenza A Ag Negative      Influenza B Ag Negative     POCT RAPID STREP A    Strep A Ag Negative     POCT INFLUENZA  A/B       Procedures  Procedures    UC Course  Diagnoses as of 05/12/21 1704   Viral pharyngitis       MDM  Number of Diagnoses or Management Options  Viral pharyngitis  Diagnosis management comments: 86 year old female presents for 1 day of sore throat, nasal congestion.  Symptoms suggestive of viral pharyngitis.  Negative for strep, COVID, flu.  Advised recommendation for increase fluids, Tylenol as needed.  Discussed red flags requiring immediate reevaluation with patient who verbalizes understanding and agreement plan.       Amount and/or Complexity of Data Reviewed  Clinical lab tests: ordered and reviewed    Risk of Complications, Morbidity, and/or Mortality  Presenting problems: low  Diagnostic procedures: moderate  Management options: low    Patient Progress  Patient progress: stable      Discharge Meds  ED Prescriptions    None         Home Meds  Prior to Admission medications    Medication Sig Start Date End Date Taking? Authorizing Provider   amitriptyline (Elavil) 50 mg tablet Take 100 mg by mouth in the morning and at bedtime. 11/23/19  Yes Nurse [facility], RN   calcium citrate (Calcitrate) 200 mg (950 mg) tablet Take 1 tablet by mouth in the morning.   Yes Nurse [facility], RN   Lactobacillus acidophilus 0.5 mg (100 million cell) tablet Take by mouth.   Yes Nurse [facility], RN   levothyroxine (Tirosint) 100 mcg capsule Take 125 mcg by mouth in the morning. 11/23/19  Yes  Nurse [facility], RN   loperamide (Imodium) 2 mg capsule Take 2 mg by mouth if needed in the morning, at noon, in the evening, and at bedtime for diarrhea.   Yes Nurse [facility], RN   mirtazapine (Remeron) 15 mg tablet TAKE 1 TABLET BY MOUTH EVERYDAY AT BEDTIME 04/27/21  Yes Cheryle Horsfall, MD   multivitamin tablet Take 1 tablet by mouth in the morning.   Yes Nurse [facility], RN   pantoprazole (ProtoNix) 40 mg EC tablet TAKE 1 TABLET BY MOUTH TWICE A DAY 02/27/21  Yes Cheryle Horsfall, MD   pregabalin (Lyrica) 50 mg capsule Take 1 capsule (50 mg) by mouth in the morning. 03/20/21 07/18/21 Yes Cheryle Horsfall, MD   amitriptyline (Elavil) 50 mg tablet Take 2 tablets (100 mg) by mouth at bedtime. 11/08/20 05/07/21  Cheryle Horsfall, MD   amoxicillin (Amoxil) 500 mg capsule Take 1 capsule (500 mg) by mouth in the morning and at bedtime for 10 days.  Patient not taking: Reported on 05/12/2021 05/12/21 05/22/21  Cheryle Horsfall, MD   levothyroxine (Synthroid, Levoxyl) 50 mcg tablet Take 1 tablet (50 mcg) by mouth in the morning. 12/24/20 02/22/21  Cheryle Horsfall, MD   ondansetron (Zofran) 4 mg tablet Take 4 mg by mouth every 8 (eight) hours if needed. 10/21/20   Historical Provider, MD         Total amount of time spent on day of service doing chart review, history and physical exam, order/ review results of testing ordered (if any), patient counseling, documentation: 30 minutes.      Patient encounter note may have been created using voice recognition software and in real time during the office visit. Please excuse any typographical errors that may not have been edited out.               Lonia Skinner, MD  05/12/21 [phone]

## 2021-05-12 NOTE — Telephone Encounter (Signed)
 Called pt, advised script sent to pharmacy.

## 2021-05-24 ENCOUNTER — Other Ambulatory Visit (INDEPENDENT_AMBULATORY_CARE_PROVIDER_SITE_OTHER): Admitting: Family Medicine

## 2021-06-03 ENCOUNTER — Encounter (INDEPENDENT_AMBULATORY_CARE_PROVIDER_SITE_OTHER): Admitting: Family Medicine

## 2021-06-21 ENCOUNTER — Other Ambulatory Visit (INDEPENDENT_AMBULATORY_CARE_PROVIDER_SITE_OTHER): Admitting: Family Medicine

## 2021-06-24 ENCOUNTER — Encounter: Payer: PRIVATE HEALTH INSURANCE | Attending: Family Medicine | Primary: Family Medicine

## 2021-07-02 ENCOUNTER — Other Ambulatory Visit

## 2021-07-02 ENCOUNTER — Ambulatory Visit: Admit: 2021-07-02 | Discharge: 2021-07-02 | Payer: MEDICARE | Attending: Family Medicine | Primary: Family Medicine

## 2021-07-02 ENCOUNTER — Other Ambulatory Visit: Admit: 2021-07-02 | Payer: MEDICARE | Primary: Family Medicine

## 2021-07-02 ENCOUNTER — Encounter (INDEPENDENT_AMBULATORY_CARE_PROVIDER_SITE_OTHER): Admitting: Family Medicine

## 2021-07-02 VITALS — BP 110/68 | Temp 98.7°F | Wt 99.0 lb

## 2021-07-02 DIAGNOSIS — E039 Hypothyroidism, unspecified: Secondary | ICD-10-CM

## 2021-07-02 DIAGNOSIS — K311 Adult hypertrophic pyloric stenosis: Secondary | ICD-10-CM

## 2021-07-02 DIAGNOSIS — D509 Iron deficiency anemia, unspecified: Secondary | ICD-10-CM

## 2021-07-02 LAB — CBC WITH DIFFERENTIAL
Basophils %: 0.2 %
Basophils Absolute: 0.02 10*3/uL (ref 0.00–0.22)
Eosinophils %: 1.6 %
Eosinophils Absolute: 0.13 10*3/uL (ref 0.00–0.50)
Hematocrit: 26.1 % — ABNORMAL LOW (ref 32.0–47.0)
Hemoglobin: 7 g/dL — ABNORMAL LOW (ref 11.0–16.0)
Immature Granulocytes %: 0.2 %
Immature Granulocytes Absolute: 0.02 10*3/uL (ref 0.00–0.10)
Lymphocyte %: 17.8 %
Lymphocytes Absolute: 1.49 10*3/uL (ref 0.70–4.00)
MCH: 18.4 pg — ABNORMAL LOW (ref 26.0–34.0)
MCHC: 26.8 g/dL — ABNORMAL LOW (ref 31.0–37.0)
MCV: 68.7 fL — ABNORMAL LOW (ref 80.0–100.0)
Monocytes %: 12.8 %
Monocytes Absolute: 1.07 10*3/uL — ABNORMAL HIGH (ref 0.36–0.77)
NRBC %: 0 % (ref 0.0–0.0)
NRBC Absolute: 0 10*3/uL (ref 0.00–2.00)
Neutrophil %: 67.4 %
Neutrophils Absolute: 5.62 10*3/uL (ref 1.50–7.95)
Platelets: 310 10*3/uL (ref 150–400)
RBC: 3.8 M/uL (ref 3.70–5.20)
RDW-CV: 18.6 % — ABNORMAL HIGH (ref 11.5–14.5)
RDW-SD: 45.1 fL (ref 35.0–51.0)
WBC: 8.4 10*3/uL (ref 4.0–11.0)

## 2021-07-02 LAB — BASIC METABOLIC PANEL
Anion Gap: 3 mmol/L (ref 3–14)
BUN: 26 mg/dL — ABNORMAL HIGH (ref 6–24)
CO2 (Bicarbonate): 28 mmol/L (ref 20–32)
Calcium: 8.6 mg/dL (ref 8.5–10.5)
Chloride: 108 mmol/L (ref 98–110)
Creatinine: 0.61 mg/dL (ref 0.55–1.30)
Glucose: 90 mg/dL (ref 70–110)
Potassium: 4.2 mmol/L (ref 3.6–5.2)
Sodium: 139 mmol/L (ref 135–146)
eGFRcr: 88 mL/min/{1.73_m2} (ref >=60)

## 2021-07-02 LAB — RBC MORPHOLOGY

## 2021-07-02 LAB — TSH: TSH: 5.42 u[IU]/mL — ABNORMAL HIGH (ref 0.358–3.740)

## 2021-07-02 NOTE — Progress Notes (Signed)
 [facility]  [facility]  [address]  [address]  [place] [address]  Dept: [phone]  Dept Fax: [phone]     Patient ID: [name] is a 86 y.o. female who presents for 6 month follow-up (Feeling pretty well--getting dizzy more  feels like she has acid reflux).    Subjective   HPI   Here in follow up.   Gained weight    Up to 99 pounds.   No vomiting.     No headaches.   Mood is better.             No diarrhea.    No abdominal pain.   Some acid reflux.   Dizzy when standing at times.      Current Outpatient Medications   Medication Instructions   . amitriptyline (ELAVIL) 100 mg, oral, 2 times daily   . calcium citrate (Calcitrate) 200 mg (950 mg) tablet 1 tablet, oral, Daily   . Lactobacillus acidophilus 0.5 mg (100 million cell) tablet oral   . levothyroxine (SYNTHROID, LEVOXYL) 50 mcg, oral, Daily   . mirtazapine (Remeron) 15 mg tablet TAKE 1 TABLET BY MOUTH EVERYDAY AT BEDTIME   . multivitamin tablet 1 tablet, oral, Daily   . pantoprazole (ProtoNix) 40 mg EC tablet TAKE 1 TABLET BY MOUTH TWICE A DAY   . pregabalin (LYRICA) 50 mg, oral, Daily   . pregabalin (LYRICA) 50 mg, oral, Daily     Review of Systems   Constitutional: Negative.    HENT: Negative.    Eyes: Negative.    Respiratory: Negative.    Cardiovascular: Negative.    Gastrointestinal: Positive for heartburn.   Genitourinary: Negative.    Musculoskeletal: Positive for joint pain.   Skin: Negative.    Neurological: Positive for dizziness.     Objective   Visit Vitals  BP 110/68   Temp 37.1 C (98.7 F)   Wt 44.9 kg   BMI 17.54 kg/m   BSA 1.41 m       Physical Exam  BP 110/68   Temp 37.1 C (98.7 F)   Wt 44.9 kg   BMI 17.54 kg/m     General Appearance:    Alert, cooperative, no distress, appears stated age   Head:    Normocephalic, without obvious abnormality, atraumatic   Eyes:    PERRL, conjunctiva/corneas clear, EOM's intact, fundi     benign, both eyes   Ears:    Normal TM's and external  ear canals, both ears   Nose:   Nares normal, septum midline, mucosa normal, no drainage     or sinus tenderness   Throat:   Lips, mucosa, and tongue normal; teeth and gums normal   Neck:   Supple, symmetrical, trachea midline, no adenopathy;     thyroid:  no enlargement/tenderness/nodules; no carotid    bruit or JVD   Back:     Symmetric, no curvature, ROM normal, no CVA tenderness   Lungs:     Clear to auscultation bilaterally, respirations unlabored   Chest Wall:    No tenderness or deformity    Heart:    Regular rate and rhythm, S1 and S2 normal, no murmur, rub    or gallop   Breast Exam:       Abdomen:     Soft, non-tender, bowel sounds active all four quadrants,     no masses, no organomegaly   Genitalia:     Rectal:  Extremities:   Extremities normal, atraumatic, no cyanosis or edema   Pulses:   2+ and symmetric all extremities   Skin:   Skin color, texture, turgor normal, no rashes or lesions   Lymph nodes:   Cervical, supraclavicular, and axillary nodes normal   Neurologic:   CNII-XII intact, normal strength, sensation and reflexes     throughout     Assessment/Plan   [name] was seen today for 6 month follow-up.  Gastric outlet obstruction  Comments:  better over past 6 months  Failure to thrive in adult  Comments:  better now.   gaining weight  Depression with anxiety  Comments:  mood is better  Psychophysiological insomnia  Comments:  on amitryptilline at night  Acquired hypothyroidism  Comments:  TSH normal  Orders:  -     TSH; Future  Iron deficiency anemia, unspecified iron deficiency anemia type  Comments:  check labs.  hx of this in the past.  Orders:  -     CBC and differential - WAM and non-WAM; Future  Neuropathy  Comments:  on lyrica  Dizziness  Comments:  check labs. push fluids and salt  Orders:  -     Basic metabolic panel; Future

## 2021-07-14 ENCOUNTER — Other Ambulatory Visit (INDEPENDENT_AMBULATORY_CARE_PROVIDER_SITE_OTHER): Admitting: Family Medicine

## 2021-07-19 ENCOUNTER — Other Ambulatory Visit (INDEPENDENT_AMBULATORY_CARE_PROVIDER_SITE_OTHER): Admitting: Family Medicine

## 2021-07-22 ENCOUNTER — Emergency Department: Admit: 2021-07-22 | Payer: MEDICARE | Primary: Family Medicine

## 2021-07-22 ENCOUNTER — Inpatient Hospital Stay
Admission: EM | Admit: 2021-07-22 | Discharge: 2021-07-26 | Disposition: A | Discharge status: Home / Self Care | Payer: MEDICARE | Admitting: Family Medicine

## 2021-07-22 ENCOUNTER — Inpatient Hospital Stay: Admit: 2021-07-22 | Payer: MEDICARE | Primary: Family Medicine

## 2021-07-22 ENCOUNTER — Other Ambulatory Visit

## 2021-07-22 ENCOUNTER — Encounter

## 2021-07-22 ENCOUNTER — Encounter (HOSPITAL_BASED_OUTPATIENT_CLINIC_OR_DEPARTMENT_OTHER)

## 2021-07-22 DIAGNOSIS — R55 Syncope and collapse: Secondary | ICD-10-CM

## 2021-07-22 DIAGNOSIS — D509 Iron deficiency anemia, unspecified: Principal | ICD-10-CM

## 2021-07-22 LAB — COMPREHENSIVE METABOLIC PANEL
ALT: 23 U/L (ref 0–55)
AST: 22 U/L (ref 6–42)
Albumin: 3.4 g/dL (ref 3.2–5.0)
Alkaline phosphatase: 196 U/L — ABNORMAL HIGH (ref 30–130)
Anion Gap: 11 mmol/L (ref 3–14)
BUN: 22 mg/dL (ref 6–24)
Bilirubin, total: 0.3 mg/dL (ref 0.2–1.2)
CO2 (Bicarbonate): 23 mmol/L (ref 20–32)
Calcium: 8.4 mg/dL — ABNORMAL LOW (ref 8.5–10.5)
Chloride: 105 mmol/L (ref 98–110)
Creatinine: 1.03 mg/dL (ref 0.55–1.30)
Glucose: 133 mg/dL — ABNORMAL HIGH (ref 70–110)
Potassium: 3.7 mmol/L (ref 3.6–5.2)
Protein, total: 6.2 g/dL (ref 6.0–8.4)
Sodium: 139 mmol/L (ref 135–146)
eGFRcr: 54 mL/min/{1.73_m2} — ABNORMAL LOW (ref >=60)

## 2021-07-22 LAB — URINALYSIS REFLEX TO CULTURE
Bilirubin, Ur: NEGATIVE
Casts, Ur: 18 /LPF — ABNORMAL HIGH (ref 0–4)
Epithelial Cells, UR: 6 {cells}/[HPF] — ABNORMAL HIGH (ref 0–5)
Glucose,Ur: NEGATIVE mg/dL
Ketones, Ur: NEGATIVE mg/dL
Leukocyte Esterase, Ur: NEGATIVE WBC/uL
Nitrite, Ur: NEGATIVE
Protein,Ur: NEGATIVE mg/dL
RBC, Ur: 14 /HPF — ABNORMAL HIGH (ref 0–4)
Specific Gravity, Ur: 1.01 (ref 1.005–1.030)
Urobilinogen, Ur: 0.2 (ref 0.2–1.0)
WBC, Ur: 4 /HPF (ref 0–5)
pH, Ur: 6.5 (ref 5.0–8.0)

## 2021-07-22 LAB — TYPE AND SCREEN
ABORh: A POS
Antibody Screen: NEGATIVE

## 2021-07-22 LAB — PREPARE RBC, LEUKOREDUCED
Barcoded ABO/Rh: 6200
Blood Expiration Date: 202301112359
Dispense Status: TRANSFUSED
Product Blood Type: A POS

## 2021-07-22 LAB — IRON, FERRITIN, TIBC
Ferritin: 2.4 ng/mL — ABNORMAL LOW (ref 10.0–307.0)
Iron Saturation: 2 % — ABNORMAL LOW (ref 10–50)
Iron: 11 ug/dL — ABNORMAL LOW (ref 30–180)
TIBC: 465 ug/dL — ABNORMAL HIGH (ref 250–463)

## 2021-07-22 LAB — CBC WITH DIFFERENTIAL
Basophils %: 0.2 %
Basophils Absolute: 0.03 10*3/uL (ref 0.00–0.22)
Eosinophils %: 0.4 %
Eosinophils Absolute: 0.06 10*3/uL (ref 0.00–0.50)
Hematocrit: 24.9 % — ABNORMAL LOW (ref 32.0–47.0)
Hemoglobin: 6.7 g/dL — CL (ref 11.0–16.0)
Immature Granulocytes %: 0.5 %
Immature Granulocytes Absolute: 0.09 10*3/uL (ref 0.00–0.10)
Lymphocyte %: 7.9 %
Lymphocytes Absolute: 1.36 10*3/uL (ref 0.70–4.00)
MCH: 17.8 pg — ABNORMAL LOW (ref 26.0–34.0)
MCHC: 26.9 g/dL — ABNORMAL LOW (ref 31.0–37.0)
MCV: 66 fL — ABNORMAL LOW (ref 80.0–100.0)
MPV: 11.3 fL (ref 9.1–12.4)
Monocytes %: 7.2 %
Monocytes Absolute: 1.23 10*3/uL — ABNORMAL HIGH (ref 0.36–0.77)
NRBC %: 0 % (ref 0.0–0.0)
NRBC Absolute: 0 10*3/uL (ref 0.00–2.00)
Neutrophil %: 83.8 %
Neutrophils Absolute: 14.37 10*3/uL — ABNORMAL HIGH (ref 1.50–7.95)
Platelets: 403 10*3/uL — ABNORMAL HIGH (ref 150–400)
RBC: 3.77 M/uL (ref 3.70–5.20)
RDW-CV: 19.4 % — ABNORMAL HIGH (ref 11.5–14.5)
RDW-SD: 44.7 fL (ref 35.0–51.0)
WBC: 17.1 10*3/uL — ABNORMAL HIGH (ref 4.0–11.0)

## 2021-07-22 LAB — RAPID SARS-COV-2: SARS-CoV-2 RNA PCR: NOT DETECTED

## 2021-07-22 LAB — RBC MORPHOLOGY

## 2021-07-22 LAB — HEMOGLOBIN AND HEMATOCRIT, BLOOD
Hematocrit: 24.5 % — ABNORMAL LOW (ref 32.0–47.0)
Hemoglobin: 7.2 g/dL — ABNORMAL LOW (ref 11.0–16.0)

## 2021-07-22 LAB — LIGHT BLUE TOP

## 2021-07-22 LAB — TROPONIN I, HIGH SENSITIVITY: Troponin I, High Sensitivity: <6 ng/L (ref <=59)

## 2021-07-22 MED ORDER — acetaminophen (Tylenol) tablet 650 mg
325 | Freq: Four times a day (QID) | ORAL | Status: DC | PRN
Start: 2021-07-22 — End: 2021-07-26

## 2021-07-22 MED ORDER — mirtazapine (Remeron) tablet 15 mg
15 | Freq: Every evening | ORAL | Status: DC
Start: 2021-07-22 — End: 2021-07-26
  Administered 2021-07-23 – 2021-07-26 (×4): 15 mg via ORAL

## 2021-07-22 MED ORDER — levothyroxine (Synthroid, Levoxyl) tablet 50 mcg
50 | Freq: Every day | ORAL | Status: DC
Start: 2021-07-22 — End: 2021-07-26
  Administered 2021-07-23 – 2021-07-26 (×4): 50 ug via ORAL

## 2021-07-22 MED ORDER — ondansetron ODT (Zofran-ODT) disintegrating tablet 4 mg
4 | Freq: Three times a day (TID) | ORAL | Status: DC | PRN
Start: 2021-07-22 — End: 2021-07-26

## 2021-07-22 MED ORDER — acetaminophen (Tylenol) suppository 650 mg
650 | Freq: Four times a day (QID) | RECTAL | Status: DC | PRN
Start: 2021-07-22 — End: 2021-07-26

## 2021-07-22 MED ORDER — amitriptyline (Elavil) tablet 50 mg
50 | Freq: Two times a day (BID) | ORAL | Status: DC
Start: 2021-07-22 — End: 2021-07-26
  Administered 2021-07-23 – 2021-07-26 (×7): 50 mg via ORAL

## 2021-07-22 MED ORDER — melatonin tablet 3 mg
3 | Freq: Every evening | ORAL | Status: DC
Start: 2021-07-22 — End: 2021-07-26
  Administered 2021-07-25 – 2021-07-26 (×2): 3 mg via ORAL

## 2021-07-22 MED ORDER — bisacodyl (Dulcolax) EC tablet 10 mg
5 | Freq: Every day | ORAL | Status: DC | PRN
Start: 2021-07-22 — End: 2021-07-26

## 2021-07-22 MED ORDER — lactobacillus (Culturelle) capsule 1 capsule
10 | Freq: Every day | ORAL | Status: DC
Start: 2021-07-22 — End: 2021-07-26
  Administered 2021-07-23 – 2021-07-26 (×3): 1 via ORAL

## 2021-07-22 MED ORDER — pregabalin (Lyrica) capsule 50 mg
25 | Freq: Every evening | ORAL | Status: DC
Start: 2021-07-22 — End: 2021-07-26
  Administered 2021-07-23 – 2021-07-26 (×4): 50 mg via ORAL

## 2021-07-22 MED ORDER — iron sucrose (Venofer) 300 mg in sodium chloride 0.9 % 250 mL IVPB
100 | Freq: Once | INTRAVENOUS | Status: AC
Start: 2021-07-22 — End: 2021-07-22
  Administered 2021-07-22: 300 mg via INTRAVENOUS

## 2021-07-22 MED ORDER — pantoprazole (ProtoNix) injection 40 mg
40 | Freq: Two times a day (BID) | INTRAVENOUS | Status: DC
Start: 2021-07-22 — End: 2021-07-26
  Administered 2021-07-22 – 2021-07-26 (×8): 40 mg via INTRAVENOUS

## 2021-07-22 MED ORDER — pregabalin (Lyrica) capsule 50 mg
25 | Freq: Every day | ORAL | Status: DC
Start: 2021-07-22 — End: 2021-07-22

## 2021-07-22 MED ORDER — ondansetron (Zofran) injection 4 mg
4 | Freq: Once | INTRAMUSCULAR | Status: AC
Start: 2021-07-22 — End: 2021-07-22
  Administered 2021-07-22: 19:00:00 4 mg via INTRAVENOUS

## 2021-07-22 MED ORDER — multivitamin with folic acid 1 tablet
400 | Freq: Every day | ORAL | Status: DC
Start: 2021-07-22 — End: 2021-07-26
  Administered 2021-07-25 – 2021-07-26 (×2): 1 via ORAL

## 2021-07-22 MED ORDER — ondansetron (Zofran) injection 4 mg
4 | Freq: Three times a day (TID) | INTRAMUSCULAR | Status: DC | PRN
Start: 2021-07-22 — End: 2021-07-26

## 2021-07-22 MED ORDER — acetaminophen (Tylenol) solution 650 mg
325 | Freq: Four times a day (QID) | ORAL | Status: DC | PRN
Start: 2021-07-22 — End: 2021-07-26

## 2021-07-22 MED ORDER — sodium chloride 0.9 % bolus 1,000 mL
0.9 | Freq: Once | INTRAVENOUS | Status: AC
Start: 2021-07-22 — End: 2021-07-22
  Administered 2021-07-22: 19:00:00 1000 mL via INTRAVENOUS

## 2021-07-22 MED FILL — ONDANSETRON HCL (PF) 4 MG/2 ML INJECTION SOLUTION: 4 4 mg/2 mL | INTRAMUSCULAR | Qty: 2

## 2021-07-22 MED FILL — IRON SUCROSE IVPB IN NS 250 ML: 100 300.0000 mg | INTRAVENOUS | Qty: 15

## 2021-07-22 MED FILL — PANTOPRAZOLE 40 MG INTRAVENOUS SOLUTION: 40 40 mg | INTRAVENOUS | Qty: 40

## 2021-07-22 NOTE — ED Provider Notes (Signed)
 HPI   Chief Complaint   Patient presents with   . Syncope         History provided by:  Patient, relative and EMS personnel  Language interpreter used: No    Syncope  Episode history:  Single  Most recent episode:  Today  Progression:  Resolved  Chronicity:  New  Context: standing up    Witnessed: yes    Relieved by:  None tried  Worsened by:  Posture  Ineffective treatments: sitting down.  Associated symptoms: dizziness, nausea and weakness    Associated symptoms: no anxiety, no chest pain, no confusion, no diaphoresis, no difficulty breathing, no fever, no focal sensory loss, no focal weakness, no headaches, no palpitations, no recent fall, no recent injury, no recent surgery, no rectal bleeding, no seizures, no shortness of breath and no vomiting    Risk factors: no coronary artery disease and no seizures                  Glasgow Coma Scale Score: 15                                  Patient History   Past Medical History:   Diagnosis Date   . Hx of carcinoid syndrome    . Hypothyroid    . Insomnia    . Iron deficiency anemia    . Neuropathy    . Peptic ulcer disease    . Skin cancer, basal cell      Past Surgical History:   Procedure Laterality Date   . COLON SURGERY  2020    partial     No family history on file.  Social History     Tobacco Use   . Smoking status: Never   . Smokeless tobacco: Never   Vaping Use   . Vaping Use: Never used   Substance Use Topics   . Alcohol use: Not Currently   . Drug use: Never       Review of Systems   Review of Systems   Constitutional: Positive for activity change, appetite change and chills. Negative for diaphoresis and fever.   HENT: Negative.    Eyes: Negative.    Respiratory: Negative for chest tightness, shortness of breath and wheezing.    Cardiovascular: Positive for syncope. Negative for chest pain, palpitations and leg swelling.   Gastrointestinal: Positive for diarrhea and nausea. Negative for abdominal distention, abdominal pain, blood in stool and vomiting.    Endocrine: Negative.    Genitourinary: Negative for decreased urine volume, difficulty urinating, flank pain, hematuria and urgency.   Musculoskeletal: Negative for back pain, neck pain and neck stiffness.   Skin: Positive for pallor.   Allergic/Immunologic: Negative for immunocompromised state.   Neurological: Positive for dizziness, syncope, weakness and light-headedness. Negative for focal weakness, seizures, facial asymmetry, speech difficulty and headaches.   Hematological: Negative.    Psychiatric/Behavioral: Negative for confusion. The patient is not nervous/anxious.        Physical Exam   ED Triage Vitals [07/22/21 1326]   Temp Pulse Resp BP   37.1 C (98.7 F) 76 18 128/80      SpO2 Temp Source Heart Rate Source Patient Position   96 % Oral Monitor --      BP Location FiO2 (%)     -- --       Physical Exam  Vitals and nursing note reviewed.  Constitutional:       General: She is not in acute distress.     Appearance: Normal appearance. She is not toxic-appearing or diaphoretic.   HENT:      Head: Normocephalic and atraumatic.      Mouth/Throat:      Mouth: Mucous membranes are moist.   Eyes:      General: Vision grossly intact.      Extraocular Movements: Extraocular movements intact.      Right eye: Normal extraocular motion and no nystagmus.      Left eye: Normal extraocular motion and no nystagmus.      Conjunctiva/sclera: Conjunctivae normal.      Pupils: Pupils are equal, round, and reactive to light.   Cardiovascular:      Rate and Rhythm: Normal rate and regular rhythm.      Heart sounds: Normal heart sounds. No murmur heard.  Pulmonary:      Effort: No respiratory distress.   Abdominal:      General: There is no distension.      Palpations: Abdomen is soft.      Tenderness: There is abdominal tenderness in the epigastric area. There is no right CVA tenderness, left CVA tenderness or guarding.   Musculoskeletal:         General: No swelling, tenderness or signs of injury. Normal range of motion.       Cervical back: Normal range of motion and neck supple. No tenderness.      Right lower leg: No edema.      Left lower leg: No edema.   Skin:     General: Skin is warm and dry.      Capillary Refill: Capillary refill takes less than 2 seconds.      Coloration: Skin is pale. Skin is not jaundiced.   Neurological:      General: No focal deficit present.      Mental Status: She is alert and oriented to person, place, and time. Mental status is at baseline.   Psychiatric:         Mood and Affect: Mood normal.         Behavior: Behavior normal.       CT HEAD WO CONTRAST   Final Result   No acute infarct, intracranial hemorrhage, mass or fracture.           Roderic Palau, MD 07/22/2021 2:24 PM          Labs Reviewed   COMPREHENSIVE METABOLIC PANEL - Abnormal       Result Value    Sodium 139      Potassium 3.7      Chloride 105      CO2 (Bicarbonate) 23      Anion Gap 11      BUN 22      Creatinine 1.03      eGFRcr 54 (*)     Glucose 133 (*)     Fasting? Unknown      Calcium 8.4 (*)     AST 22      ALT 23      Alkaline phosphatase 196 (*)     Protein, total 6.2      Albumin 3.4      Bilirubin, total 0.3     CBC WITH DIFFERENTIAL - WAM AND NON-WAM - Abnormal    WBC 17.1 (*)     RBC 3.77      Hemoglobin 6.7 (*)  Hematocrit 24.9 (*)     MCV 66.0 (*)     MCH 17.8 (*)     MCHC 26.9 (*)     RDW-CV 19.4 (*)     RDW-SD 44.7      Platelets 403 (*)     MPV 11.3      Neutrophil % 83.8      Lymphocyte % 7.9      Monocytes % 7.2      Eosinophils % 0.4      Basophils % 0.2      Immature Granulocytes % 0.5      NRBC % 0.0      Neutrophils Absolute 14.37 (*)     Lymphocytes Absolute 1.36      Monocytes Absolute 1.23 (*)     Eosinophils Absolute 0.06      Basophils Absolute 0.03      Immature Granulocytes Absolute 0.09      NRBC Absolute 0.00     TROPONIN I, HIGH SENSITIVITY - Normal    Troponin I, High Sensitivity <6     COVID-19 PCR TESTING    Narrative:     The following orders were created for panel order COVID-19  PCR Testing.  Procedure                               Abnormality         Status                     ---------                               -----------         ------                     Rapid SARS-CoV-2[[id number]]                                                              Please view results for these tests on the individual orders.   CBC W/DIFF    Narrative:     The following orders were created for panel order CBC and differential.  Procedure                               Abnormality         Status                     ---------                               -----------         ------                     CBC w/ Differential[[id number]]           Abnormal            Final result                 Please view results for these  tests on the individual orders.   URINALYSIS REFLEX TO CULTURE   RBC MORPHOLOGY   TYPE AND SCREEN   PREPARE RBC, LEUKOREDUCED   RAPID SARS-COV-2       ED Course & MDM   ED Course as of 07/22/21 1448   Tue Jul 22, 2021   1352 Ekg nsr at 80 no priors   1445 Dr gad accepts the admission         Diagnoses as of 07/22/21 1448   Postural syncope   Anemia, unspecified type       Medical Decision Making  Anemia, unspecified type: chronic illness or injury  Postural syncope: acute illness or injury  Amount and/or Complexity of Data Reviewed  Independent Historian:      Details: daughter  External Data Reviewed: labs.  Labs: ordered. Decision-making details documented in ED Course.  Radiology: ordered. Decision-making details documented in ED Course.  ECG/medicine tests: ordered and independent interpretation performed. Decision-making details documented in ED Course.      Risk  Prescription drug management.  Decision regarding hospitalization.               Zebedee Iba, MD  07/22/21 [phone]

## 2021-07-22 NOTE — ED Triage Notes (Signed)
 Pt BIBA from [employer] [facility] she was working. Staff reports that she had a syncopal episode and reports that she has been feeling dizzy. Pt was lowered to the floor, appears pale awake and alert was incont. Of stool on arrival.

## 2021-07-22 NOTE — Nursing Note (Signed)
Pt arrived from ED via stretcher with belongings, oriented to room, call bell within reach. Bed at the lowest position with alarm on.

## 2021-07-22 NOTE — H&P (Signed)
 INPATIENT H&P  [facility] GENERAL EMERGENCY [facility] [place] [address]  [phone]    Today's Date: 07/22/2021  MRN: [id number]  Name: [name]  DOB: 05-23-1935    Chief Complaint  Chief Complaint   Patient presents with   . Syncope       History Of Present Illness  [name] is a 87 y.o. female presenting with history of gastric outlet obstruction, gastric ulcer s/p Billruth II surgery with revision 2021, stool urge incontinence, anxiety/depression, neuropathy, who presented after a syncopal event while at work at [employer]. She felt dizzy then went to rest on a chair then fainted. No reported injuries. She lost cot control of her bowl but states this is not unexpected given her issues with stool incontinence. She denies any bloody or black stools. Here VSS. Labs show significant anemia with Hb of 6.7 (down from 7 mid Dec and 9.9 last year).    Past Medical History  She has a past medical history of carcinoid syndrome, Hypothyroid, Insomnia, Iron deficiency anemia, Neuropathy, Peptic ulcer disease, and Skin cancer, basal cell.    Surgical History  She has a past surgical history that includes Colon surgery (2020).     Social History  She reports that she has never smoked. She has never used smokeless tobacco. She reports that she does not currently use alcohol. She reports that she does not use drugs.    Family History  No family history on file.     Advance Care Plan  Extended Emergency Contact Information  Primary Emergency Contact: [facility]  Address: [address]           [place], [place] [address] [place]  Home Phone: [phone]  Mobile Phone: [phone]  Relation: Daughter  Full Code       Allergies  Patient has no known allergies.     Medications  (Not in a hospital admission)      Medications      Start Medication Dose/Rate, Route, Frequency Ordered Stop    07/22/21 2200 mirtazapine (Remeron) tablet 15 mg         15 mg, oral, Nightly  07/22/21 1617 --    07/22/21 2200 melatonin tablet 3 mg         3 mg, oral, Nightly 07/22/21 1626 --    07/22/21 2100 amitriptyline (Elavil) tablet 100 mg         100 mg, oral, 2 times daily 07/22/21 1617 --    07/22/21 1625 acetaminophen (Tylenol) tablet 650 mg        See Hyperspace for full Linked Orders Report.    650 mg, oral, Every 6 hours PRN 07/22/21 1626 --    07/22/21 1625 acetaminophen (Tylenol) solution 650 mg        See Hyperspace for full Linked Orders Report.    650 mg, oral, Every 6 hours PRN 07/22/21 1626 --    07/22/21 1625 acetaminophen (Tylenol) suppository 650 mg        See Hyperspace for full Linked Orders Report.    650 mg, rect, Every 6 hours PRN 07/22/21 1626 --    07/22/21 1625 ondansetron ODT (Zofran-ODT) disintegrating tablet 4 mg        See Hyperspace for full Linked Orders Report.    4 mg, oral, Every 8 hours PRN 07/22/21 1626 --    07/22/21 1625 ondansetron (Zofran) injection 4 mg        See Hyperspace  for full Linked Orders Report.    4 mg, IV, Every 8 hours PRN 07/22/21 1626 --    07/22/21 1625 bisacodyl (Dulcolax) EC tablet 10 mg         10 mg, oral, Daily PRN 07/22/21 1626 --    07/22/21 1620 iron sucrose (Venofer) 300 mg in sodium chloride 0.9 % 250 mL IVPB         300 mg, IV, Once 07/22/21 1616 --    07/22/21 1620 lactobacillus (Culturelle) capsule 1 capsule         1 capsule, oral, Daily 07/22/21 1617 --    07/22/21 1620 levothyroxine (Synthroid, Levoxyl) tablet 50 mcg         50 mcg, oral, Daily 07/22/21 1617 --    07/22/21 1620 multivitamin with folic acid 1 tablet         1 tablet, oral, Daily 07/22/21 1617 --    07/22/21 1620 pregabalin (Lyrica) capsule 50 mg         50 mg, oral, Daily 07/22/21 1617 --    07/22/21 1615 pantoprazole (ProtoNix) injection 40 mg         40 mg, IV, Every 12 hours 07/22/21 1614 --                Review of Systems    Objective   General: female  patient appears of stated age laying in bed in no acute distress  HEENT: normocephalic/atraumatic,  EOMI  CVS: regular rate and rhythm, no additional sounds appreciated  Chest: good chest expansion, no accessory muscle use  Abdomen: soft, no tenderness, no distension  Neurology: AAOx3, no focal deficits appreciated  Extremities: intact peripheral pulses, no edema    Last Recorded Vitals  Blood pressure 118/62, pulse 88, temperature 36.6 C (97.8 F), temperature source Oral, resp. rate 19, height 1.626 m, weight 45 kg, SpO2 98 %.    Relevant Results  As above  Assessment and Plan:  [name] is a 87 y.o. female presenting with history of gastric outlet obstruction, gastric ulcer s/p Billruth II surgery with revision 2021, stool urge incontinence, anxiety/depression, neuropathy, who presented after a syncopal event while at work at [employer]. She felt dizzy then went to rest on a chair then fainted. No reported injuries. She lost cot control of her bowl but states this is not unexpected given her issues with stool incontinence. She denies any bloody or black stools. Here VSS. Labs show significant anemia with Hb of 6.7 (down from 7 mid Dec and 9.9 last year).      # Syncope likely due to severe anemia:  Will admit to telemetry for monitoring  Check orthostatic vitals    # Severe microcytic anemia likely iron deficiency:  # Suspect occult chronic blood loss anemia:  S/p 1 u of PRBCs  monitor H&H q12h  Most recent EGD 08/2020 with normal post surgical changes, no ulcers  Will place on IV PPI BID  CLD  GI consult for possible EGD/coloscopy    # Anxiety/depression: resume home meds  # Hypothyroidism: resume levothyroxine  # Neuropathy: resume Lyrica    DVT prophylaxis: SCDs  Code status: Full Code        FACE TO FACE PATIENT COUSELLING / COORDINATING CARE MORE THAN 50% OF ENCOUNTER TIME: YES         TOTAL ENCOUNTER TIME: 60 minutes.     I certify that hospital inpatient services are reasonable and medically necessary. They are appropriately provided as inpatient  services in accordance with the two midnight  benchmark under 42CFR 412 3(e), or the services are specified as inpatient only procedure under 42 CFR 419 22(n).       Jennings Books, MD

## 2021-07-23 LAB — CBC WITH DIFFERENTIAL
Basophils %: 0.2 %
Basophils Absolute: 0.02 10*3/uL (ref 0.00–0.22)
Eosinophils %: 1.5 %
Eosinophils Absolute: 0.13 10*3/uL (ref 0.00–0.50)
Hematocrit: 25.7 % — ABNORMAL LOW (ref 32.0–47.0)
Hemoglobin: 7.3 g/dL — ABNORMAL LOW (ref 11.0–16.0)
Immature Granulocytes %: 0.5 %
Immature Granulocytes Absolute: 0.04 10*3/uL (ref 0.00–0.10)
Lymphocyte %: 15.9 %
Lymphocytes Absolute: 1.37 10*3/uL (ref 0.70–4.00)
MCH: 19.7 pg — ABNORMAL LOW (ref 26.0–34.0)
MCHC: 28.4 g/dL — ABNORMAL LOW (ref 31.0–37.0)
MCV: 69.3 fL — ABNORMAL LOW (ref 80.0–100.0)
Monocytes %: 9.7 %
Monocytes Absolute: 0.83 10*3/uL — ABNORMAL HIGH (ref 0.36–0.77)
NRBC %: 0 % (ref 0.0–0.0)
NRBC Absolute: 0 10*3/uL (ref 0.00–2.00)
Neutrophil %: 72.2 %
Neutrophils Absolute: 6.2 10*3/uL (ref 1.50–7.95)
Platelets: 291 10*3/uL (ref 150–400)
RBC: 3.71 M/uL (ref 3.70–5.20)
RDW-CV: 21.2 % — ABNORMAL HIGH (ref 11.5–14.5)
RDW-SD: 51.2 fL — ABNORMAL HIGH (ref 35.0–51.0)
WBC: 8.6 10*3/uL (ref 4.0–11.0)

## 2021-07-23 LAB — COMPREHENSIVE METABOLIC PANEL
ALT: 21 U/L (ref 0–55)
AST: 19 U/L (ref 6–42)
Albumin: 2.7 g/dL — ABNORMAL LOW (ref 3.2–5.0)
Alkaline phosphatase: 188 U/L — ABNORMAL HIGH (ref 30–130)
Anion Gap: 6 mmol/L (ref 3–14)
BUN: 16 mg/dL (ref 6–24)
Bilirubin, total: 0.4 mg/dL (ref 0.2–1.2)
CO2 (Bicarbonate): 27 mmol/L (ref 20–32)
Calcium: 7.7 mg/dL — ABNORMAL LOW (ref 8.5–10.5)
Chloride: 110 mmol/L (ref 98–110)
Creatinine: 0.55 mg/dL (ref 0.55–1.30)
Glucose: 87 mg/dL (ref 70–110)
Potassium: 3.4 mmol/L — ABNORMAL LOW (ref 3.6–5.2)
Protein, total: 5.7 g/dL — ABNORMAL LOW (ref 6.0–8.4)
Sodium: 143 mmol/L (ref 135–146)
eGFRcr: 90 mL/min/{1.73_m2} (ref >=60)

## 2021-07-23 LAB — RBC MORPHOLOGY

## 2021-07-23 LAB — HEMOGLOBIN AND HEMATOCRIT, BLOOD
Hematocrit: 27.2 % — ABNORMAL LOW (ref 32.0–47.0)
Hemoglobin: 7.7 g/dL — ABNORMAL LOW (ref 11.0–16.0)

## 2021-07-23 LAB — BLOOD BANK HOLD TUBE

## 2021-07-23 MED ORDER — cefTRIAXone (Rocephin) vial 1 g
1 | INTRAMUSCULAR | Status: DC
Start: 2021-07-23 — End: 2021-07-23

## 2021-07-23 MED ORDER — polyethylene glycol (Glycolax) powder 238 g
17 | Freq: Two times a day (BID) | ORAL | Status: AC
Start: 2021-07-23 — End: 2021-07-24
  Administered 2021-07-24 (×2): 238 g via ORAL

## 2021-07-23 MED ORDER — cefTRIAXone (Rocephin) 1 g in sodium chloride 0.9 % 100 mL IVPB-ADD EASE
1 | INTRAMUSCULAR | Status: DC
Start: 2021-07-23 — End: 2021-07-26
  Administered 2021-07-24 – 2021-07-26 (×3): 1 g via INTRAVENOUS

## 2021-07-23 MED FILL — PANTOPRAZOLE 40 MG INTRAVENOUS SOLUTION: 40 40 mg | INTRAVENOUS | Qty: 40

## 2021-07-23 MED FILL — MIRTAZAPINE 15 MG TABLET: 15 15 mg | ORAL | Qty: 1

## 2021-07-23 MED FILL — PREGABALIN 25 MG CAPSULE: 25 25 mg | ORAL | Qty: 2

## 2021-07-23 MED FILL — LEVOTHYROXINE 50 MCG TABLET: 50 50 mcg | ORAL | Qty: 1

## 2021-07-23 MED FILL — AMITRIPTYLINE 50 MG TABLET: 50 50 mg | ORAL | Qty: 1

## 2021-07-23 MED FILL — POLYETHYLENE GLYCOL 3350 17 GRAM/DOSE ORAL POWDER: 17 17 gram/dose | ORAL | Qty: 238

## 2021-07-23 MED FILL — MULTIVITAMIN WITH FOLIC ACID 400 MCG TABLET: 400 400 mcg | ORAL | Qty: 1

## 2021-07-23 MED FILL — MELATONIN 3 MG TABLET: 3 3 mg | ORAL | Qty: 1

## 2021-07-23 MED FILL — LACTOBACILLUS RHAMNOSUS GG 10 BILLION CELL CAPSULE: 10 10 billion cell | ORAL | Qty: 1

## 2021-07-23 NOTE — Progress Notes (Signed)
PROGRESS NOTE    Today's Date: 07/23/2021  MRN: [id number]  Name: [name]  DOB: Jul 20, 1935    Subjective   The patient was seen and examined bedside.  Pt doing okay. States she feels better with 1 unit PRBC.     Objective   Physical Exam  Constitutional:       Appearance: Normal appearance.   HENT:      Head: Normocephalic and atraumatic.   Eyes:      Extraocular Movements: Extraocular movements intact.      Conjunctiva/sclera: Conjunctivae normal.   Cardiovascular:      Rate and Rhythm: Normal rate and regular rhythm.      Pulses: Normal pulses.   Pulmonary:      Effort: Pulmonary effort is normal.   Abdominal:      General: Abdomen is flat. Bowel sounds are normal.      Palpations: Abdomen is soft.   Musculoskeletal:         General: Normal range of motion.   Skin:     General: Skin is warm.   Neurological:      General: No focal deficit present.      Mental Status: She is alert and oriented to person, place, and time.     Last Recorded Vitals  Blood pressure 105/83, pulse 90, temperature 36.4 C (97.6 F), temperature source Oral, resp. rate 14, height 1.6 m, weight 45.9 kg, SpO2 97 %.  Medications      Start Medication Dose/Rate, Route, Frequency Ordered Stop    07/23/21 1900 polyethylene glycol (Glycolax) powder 238 g         238 g, oral, 2 times daily 07/23/21 1058 07/24/21 1859    07/22/21 2200 mirtazapine (Remeron) tablet 15 mg         15 mg, oral, Nightly 07/22/21 1617 --    07/22/21 2200 melatonin tablet 3 mg         3 mg, oral, Nightly 07/22/21 1626 --    07/22/21 2200 pregabalin (Lyrica) capsule 50 mg         50 mg, oral, Nightly 07/22/21 2146 --    07/22/21 2100 amitriptyline (Elavil) tablet 50 mg         50 mg, oral, 2 times daily 07/22/21 1617 --    07/22/21 1625 acetaminophen (Tylenol) tablet 650 mg        See Hyperspace for full Linked Orders Report.    650 mg, oral, Every 6 hours PRN 07/22/21 1626 --    07/22/21 1625 acetaminophen (Tylenol) solution 650 mg        See Hyperspace for full Linked  Orders Report.    650 mg, oral, Every 6 hours PRN 07/22/21 1626 --    07/22/21 1625 acetaminophen (Tylenol) suppository 650 mg        See Hyperspace for full Linked Orders Report.    650 mg, rect, Every 6 hours PRN 07/22/21 1626 --    07/22/21 1625 ondansetron ODT (Zofran-ODT) disintegrating tablet 4 mg        See Hyperspace for full Linked Orders Report.    4 mg, oral, Every 8 hours PRN 07/22/21 1626 --    07/22/21 1625 ondansetron (Zofran) injection 4 mg        See Hyperspace for full Linked Orders Report.    4 mg, IV, Every 8 hours PRN 07/22/21 1626 --    07/22/21 1625 bisacodyl (Dulcolax) EC tablet 10 mg  10 mg, oral, Daily PRN 07/22/21 1626 --    07/22/21 1620 lactobacillus (Culturelle) capsule 1 capsule         1 capsule, oral, Daily 07/22/21 1617 --    07/22/21 1620 levothyroxine (Synthroid, Levoxyl) tablet 50 mcg         50 mcg, oral, Daily 07/22/21 1617 --    07/22/21 1620 multivitamin with folic acid 1 tablet         1 tablet, oral, Daily 07/22/21 1617 --    07/22/21 1615 pantoprazole (ProtoNix) injection 40 mg         40 mg, IV, Every 12 hours 07/22/21 1614 --               Assessment and Plan:  [name] is a 87 y.o. female presenting with history of gastric outlet obstruction, gastric ulcer s/p Billruth II surgery with revision 2021, stool urge incontinence, anxiety/depression, neuropathy, who presented after a syncopal event while at work at [employer]. She felt dizzy then went to rest on a chair then fainted. No reported injuries. She lost cot control of her bowl but states this is not unexpected given her issues with stool incontinence. She denies any bloody or black stools. Here VSS. Labs show significant anemia with Hb of 6.7 (down from 7 mid Dec and 9.9 last year).      1) Syncope likely due to severe anemia:  - Monitor on tele    2) Severe microcytic anemia likely iron deficiency  - GI consulted  - S/P 1 unit PRBC  - Plan for EGD/Colon tomorrow    3) Anxiety/depression:   - Pt  to continue on Elavil, Remeron    4) Hypothyroidism:   - Pt levothyroxine 50 mg daily    5) Neuropathy:   - Pt to continue with Lyrica    6) Depression  - Pt to continue on elavil and Remeron     FACE TO FACE PATIENT COUSELLING / COORDINATING CARE MORE THAN 50% OF ENCOUNTER TIME: YES    TOTAL ENCOUNTER TIME:  _25 minutes.    Marlowe Aschoff, MD

## 2021-07-23 NOTE — Consults (Signed)
 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 Reports dizziness ongoing for the last several months but has been getting a bit worse, especially when she is at work. She denies abdominal pain, n/v, black or bloody stools, heartburn, dysphagia. Saw her GI at [facility] for telemed visit 04/2021 and at that time was feeling well and she was scheduled for 6 month follow up. Last EGD was 08/2020 at [facility] and was normal. She states she has not had a colonoscopy in 5 years or more. Has loose stool and incontinence at baseline. In ED, found to have iron def anemia. Iron 11, TIBC 465, iron sat 2, ferritin 2.4. H&H 6.7/24.9->7.2/24.5->7.3/25.7. MCV 69. 07/02/21 H&H 7/26.1. 12/24/20 H&H 9.9/34.5. Non-smoker, no EtOH. D/w RN.    Diagnoses:  Principal Problem:    Postural syncope       Review of Systems:   Review of Systems   Constitutional: Negative.    HENT: Negative.    Eyes: Negative.    Respiratory: Negative.    Cardiovascular: Negative.    Gastrointestinal: Negative.    Endocrine: Negative.    Genitourinary: Negative.    Musculoskeletal: Negative.    Neurological: Positive for dizziness.   Hematological: Negative.    Psychiatric/Behavioral: Negative.         Past Medical History:  Past Medical History:   Diagnosis Date   . Colon cancer (CMS/HCC)    . Depression    . Duodenal ulcer    . GERD (gastroesophageal reflux disease)    . Hx of  carcinoid syndrome    . Hypothyroid    . Insomnia    . Iron deficiency anemia    . Neuropathy    . Peptic ulcer disease    . Skin cancer, basal cell         No family history on file.    Social History     Tobacco Use   . Smoking status: Never   . Smokeless tobacco: Never   Vaping Use   . Vaping Use: Never used   Substance Use Topics   . Alcohol use: Not Currently   . Drug use: Never        Allergies:  No Known Allergies    Medications:       Current Facility-Administered Medications:   .  acetaminophen (Tylenol) tablet 650 mg, 650 mg, oral, q6h PRN **OR** acetaminophen (Tylenol) solution 650 mg, 650 mg, oral, q6h PRN **OR** acetaminophen (Tylenol) suppository 650 mg, 650 mg, rectal, q6h PRN, Jennings Books, MD  .  amitriptyline (Elavil) tablet 50 mg, 50 mg, oral, BID, Jennings Books, MD, 50 mg at 07/22/21 2140  .  bisacodyl (Dulcolax) EC tablet 10 mg, 10 mg, oral, Daily PRN, Jennings Books, MD  .  lactobacillus (Culturelle) capsule 1 capsule, 1 capsule, oral, Daily, Jennings Books, MD, 1 capsule at 07/23/21 0900  .  levothyroxine (Synthroid, Levoxyl) tablet 50 mcg, 50 mcg, oral, Daily,  Jennings Books, MD, 50 mcg at 07/23/21 0916  .  melatonin tablet 3 mg, 3 mg, oral, Nightly, Jennings Books, MD  .  mirtazapine (Remeron) tablet 15 mg, 15 mg, oral, Nightly, Jennings Books, MD, 15 mg at 07/22/21 2140  .  multivitamin with folic acid 1 tablet, 1 tablet, oral, Daily, Jennings Books, MD  .  ondansetron ODT (Zofran-ODT) disintegrating tablet 4 mg, 4 mg, oral, q8h PRN **OR** ondansetron (Zofran) injection 4 mg, 4 mg, intravenous, q8h PRN, Jennings Books, MD  .  pantoprazole (ProtoNix) injection 40 mg, 40 mg, intravenous, q12h, Jennings Books, MD, 40 mg at 07/23/21 0531  .  pregabalin (Lyrica) capsule 50 mg, 50 mg, oral, Nightly, Baron Sane, MD, 50 mg at 07/22/21 2215    Physical Exam:  Vitals:   Vitals:    07/23/21 0755   BP: 105/83   Pulse:    Resp:    Temp: 36.4 C (97.6 F)   SpO2: 97%        Physical Exam  Constitutional:       General:  She is not in acute distress.     Appearance: Normal appearance.   HENT:      Head: Normocephalic and atraumatic.      Mouth/Throat:      Mouth: Mucous membranes are moist.   Eyes:      Pupils: Pupils are equal, round, and reactive to light.   Cardiovascular:      Rate and Rhythm: Normal rate and regular rhythm.   Pulmonary:      Effort: Pulmonary effort is normal.   Abdominal:      General: Bowel sounds are normal. There is no distension.      Palpations: Abdomen is soft.      Tenderness: There is no abdominal tenderness.   Musculoskeletal:         General: Normal range of motion.   Skin:     General: Skin is warm and dry.   Neurological:      General: No focal deficit present.      Mental Status: She is alert.   Psychiatric:         Mood and Affect: Mood normal.         Behavior: Behavior normal.          Imaging:    Labs:  Admission on 07/22/2021   Component Date Value Ref Range Status   . Sodium 07/22/2021 139  135 - 146 mmol/L Final   . Potassium 07/22/2021 3.7  3.6 - 5.2 mmol/L Final   . Chloride 07/22/2021 105  98 - 110 mmol/L Final   . CO2 (Bicarbonate) 07/22/2021 23  20 - 32 mmol/L Final   . Anion Gap 07/22/2021 11  3 - 14 mmol/L Final   . BUN 07/22/2021 22  6 - 24 mg/dL Final   . Creatinine 14/78/2956 1.03  0.55 - 1.30 mg/dL Final   . eGFRcr 21/30/8657 54 (L)  >=60 mL/min/1.90m*2 Final   . Glucose 07/22/2021 133 (H)  70 - 110 mg/dL Final   . Fasting? 84/69/6295 Unknown   Final   . Calcium 07/22/2021 8.4 (L)  8.5 - 10.5 mg/dL Final   . AST 28/41/3244 22  6 - 42 U/L Final   . ALT 07/22/2021 23  0 - 55 U/L Final   . Alkaline phosphatase 07/22/2021 196 (H)  30 - 130 U/L Final   . Protein, total 07/22/2021 6.2  6.0 - 8.4 g/dL Final   . Albumin  07/22/2021 3.4  3.2 - 5.0 g/dL Final   . Bilirubin, total 07/22/2021 0.3  0.2 - 1.2 mg/dL Final   . Troponin I, High Sensitivity 07/22/2021 <6  <=59 ng/L Final   . WBC 07/22/2021 17.1 (H)  4.0 - 11.0 K/uL Final   . RBC 07/22/2021 3.77  3.70 - 5.20 M/uL Final   .  Hemoglobin 07/22/2021 6.7 (LL)  11.0 - 16.0 g/dL Final    Patient received fluids     . Hematocrit 07/22/2021 24.9 (L)  32.0 - 47.0 % Final   . MCV 07/22/2021 66.0 (L)  80.0 - 100.0 fL Final   . MCH 07/22/2021 17.8 (L)  26.0 - 34.0 pg Final   . MCHC 07/22/2021 26.9 (L)  31.0 - 37.0 g/dL Final   . RDW-CV 09/60/4540 19.4 (H)  11.5 - 14.5 % Final   . RDW-SD 07/22/2021 44.7  35.0 - 51.0 fL Final   . Platelets 07/22/2021 403 (H)  150 - 400 K/uL Final   . MPV 07/22/2021 11.3  9.1 - 12.4 fL Final   . Neutrophil % 07/22/2021 83.8  % Final   . Lymphocyte % 07/22/2021 7.9  % Final   . Monocytes % 07/22/2021 7.2  % Final   . Eosinophils % 07/22/2021 0.4  % Final   . Basophils % 07/22/2021 0.2  % Final   . Immature Granulocytes % 07/22/2021 0.5  % Final   . NRBC % 07/22/2021 0.0  0.0 - 0.0 % Final   . Neutrophils Absolute 07/22/2021 14.37 (H)  1.50 - 7.95 K/uL Final   . Lymphocytes Absolute 07/22/2021 1.36  0.70 - 4.00 K/uL Final   . Monocytes Absolute 07/22/2021 1.23 (H)  0.36 - 0.77 K/uL Final   . Eosinophils Absolute 07/22/2021 0.06  0.00 - 0.50 K/uL Final   . Basophils Absolute 07/22/2021 0.03  0.00 - 0.22 K/uL Final   . Immature Granulocytes Absolute 07/22/2021 0.09  0.00 - 0.10 K/uL Final   . NRBC Absolute 07/22/2021 0.00  0.00 - 2.00 K/uL Final   . Extra Tube 07/22/2021 Hold for add-ons.   Final    Auto resulted.   Marland Kitchen Extra Tube 07/22/2021 Hold for add-ons.   Final    Auto resulted.   . Color, Ur 07/22/2021 Yellow  Yellow, Dark Yellow Final   . Clarity, Ur 07/22/2021 Cloudy (A)  Clear Final   . Specific Gravity, Ur 07/22/2021 1.010  1.005 - 1.030 Final   . pH, Ur 07/22/2021 6.5  5.0 - 8.0 Final   . Protein,Ur 07/22/2021 Negative  Negative mg/dL Final   . Herminio Commons 98/05/9146 Negative  Negative mg/dL Final   . Ketones, Ur 82/95/6213 Negative  Negative mg/dL Final   . Bilirubin, Ur 07/22/2021 Negative  Negative Final   . Blood, Ur 07/22/2021 Small (A)  Negative Final   . Urobilinogen, Ur 07/22/2021 0.2  0.2-1.0 E.U./dl  Final   . Nitrite, Ur 07/22/2021 Negative  Negative Final   . Leukocyte Esterase, Ur 07/22/2021 Negative  Negative WBC/uL Final   . RBC, Ur 07/22/2021 14 (H)  0-4 cells/HPF /HPF Final   . WBC, Ur 07/22/2021 4  0-5 cells/HPF /HPF Final   . Epithelial Cells, UR 07/22/2021 6 (H)  0-5 cells/HPF cells/HPF Final   . Bacteria, Ur 07/22/2021 Trace (A)  None Seen /HPF Final   . Casts, Ur 07/22/2021 18 (H)  0-4 cells/LPF cells/LPF Final   . Calcium Oxalate Crystal, Ur 07/22/2021 Present (A)  Not Present Final     *  CLINICAL INFORMATION*   Usually not significant, unless found persistently in   patients with renal calculi or if associated with certain   metabolic diseases.   . RBC Morphology 07/22/2021 See Indices (A)  Normal, Not Performed Final   . Polychromasia 07/22/2021 1+ (A)  (none) Final   . Elliptocytes 07/22/2021 1+ (A)  (none) Final   . Ovalocytes 07/22/2021 1+ (A)  (none) Final   . PLATELETS, ENLARGED 07/22/2021 Present (A)  (none) Final   . ABORh 07/22/2021 A POS   Final   . Antibody Screen 07/22/2021 NEG   Final   . Unit Number 07/22/2021 [id number]   Final   . Product Code 07/22/2021 J1914   Final   . Product Blood Type 07/22/2021 A POS   Final   . Dispense Status 07/22/2021 Transfused   Final   . Blood Expiration Date 07/22/2021 [id number]   Final   . Barcoded Product Code 07/22/2021 [id number]   Final   . Barcoded ABO/Rh 07/22/2021 6200   Final   . Crossmatch Interpretation 07/22/2021 Compatible   Final   . SARS-CoV-2 RNA PCR 07/22/2021 Not Detected  Not Detected Final   . Iron 07/22/2021 11 (L)  30 - 180 ug/dL Final   . TIBC 95/28/4132 465 (H)  250 - 463 ug/dL Final   . Ferritin 44/07/270 2.4 (L)  10.0 - 307.0 ng/mL Final   . Iron Saturation 07/22/2021 2 (L)  10 - 50 % Final   . Hemoglobin 07/22/2021 7.2 (L)  11.0 - 16.0 g/dL Final   . Hematocrit 53/66/4403 24.5 (L)  32.0 - 47.0 % Final   . Sodium 07/23/2021 143  135 - 146 mmol/L Final   . Potassium 07/23/2021 3.4 (L)  3.6 - 5.2 mmol/L Final   . Chloride  07/23/2021 110  98 - 110 mmol/L Final   . CO2 (Bicarbonate) 07/23/2021 27  20 - 32 mmol/L Final   . Anion Gap 07/23/2021 6  3 - 14 mmol/L Final   . BUN 07/23/2021 16  6 - 24 mg/dL Final   . Creatinine 47/42/5956 0.55  0.55 - 1.30 mg/dL Final   . eGFRcr 38/75/6433 90  >=60 mL/min/1.16m*2 Final   . Glucose 07/23/2021 87  70 - 110 mg/dL Final   . Fasting? 29/51/8841 Unknown   Final   . Calcium 07/23/2021 7.7 (L)  8.5 - 10.5 mg/dL Final   . AST 66/12/3014 19  6 - 42 U/L Final   . ALT 07/23/2021 21  0 - 55 U/L Final   . Alkaline phosphatase 07/23/2021 188 (H)  30 - 130 U/L Final   . Protein, total 07/23/2021 5.7 (L)  6.0 - 8.4 g/dL Final   . Albumin 07/28/3233 2.7 (L)  3.2 - 5.0 g/dL Final   . Bilirubin, total 07/23/2021 0.4  0.2 - 1.2 mg/dL Final   . WBC 57/32/2025 8.6  4.0 - 11.0 K/uL Final   . RBC 07/23/2021 3.71  3.70 - 5.20 M/uL Final   . Hemoglobin 07/23/2021 7.3 (L)  11.0 - 16.0 g/dL Final   . Hematocrit 42/70/6237 25.7 (L)  32.0 - 47.0 % Final   . MCV 07/23/2021 69.3 (L)  80.0 - 100.0 fL Final    Patient received blood.     Marland Kitchen MCH 07/23/2021 19.7 (L)  26.0 - 34.0 pg Final   . MCHC 07/23/2021 28.4 (L)  31.0 - 37.0 g/dL Final   . RDW-CV 62/83/1517 21.2 (H)  11.5 - 14.5 % Final   .  RDW-SD 07/23/2021 51.2 (H)  35.0 - 51.0 fL Final   . Platelets 07/23/2021 291  150 - 400 K/uL Final   . MPV 07/23/2021    Final    Not Measured   . Neutrophil % 07/23/2021 72.2  % Final   . Lymphocyte % 07/23/2021 15.9  % Final   . Monocytes % 07/23/2021 9.7  % Final   . Eosinophils % 07/23/2021 1.5  % Final   . Basophils % 07/23/2021 0.2  % Final   . Immature Granulocytes % 07/23/2021 0.5  % Final   . NRBC % 07/23/2021 0.0  0.0 - 0.0 % Final   . Neutrophils Absolute 07/23/2021 6.20  1.50 - 7.95 K/uL Final   . Lymphocytes Absolute 07/23/2021 1.37  0.70 - 4.00 K/uL Final   . Monocytes Absolute 07/23/2021 0.83 (H)  0.36 - 0.77 K/uL Final   . Eosinophils Absolute 07/23/2021 0.13  0.00 - 0.50 K/uL Final   . Basophils Absolute 07/23/2021 0.02   0.00 - 0.22 K/uL Final   . Immature Granulocytes Absolute 07/23/2021 0.04  0.00 - 0.10 K/uL Final   . NRBC Absolute 07/23/2021 0.00  0.00 - 2.00 K/uL Final   . RBC Morphology 07/23/2021 See Indices (A)  Normal, Not Performed Final   . Polychromasia 07/23/2021 1+ (A)  (none) Final   . Ovalocytes 07/23/2021 1+ (A)  (none) Final   . 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 No overt GI bleeding however, given significant anemia and GI hx, will plan on EGD/colonoscopy tomorrow to evaluate. Clear liquids today, NPO after MN. Prep ordered.

## 2021-07-23 NOTE — Progress Notes (Signed)
 07/23/21 1343   Case Management Initial Assessment   Type of Residence Home   Lives With  Children   Support Systems Children;Family members   Current Patient Responsibilities  Personal care;Work;Driving;Laundry;Housekeeping;Meal prep   Current Home Treatment/Devices/Equipment  Standard walker     Case Management Progress Note:   07/23/21 Pt is a 87 y/o female who was BIBA from [employer] where she works after syncopal episode. Met with pt, introduced COC role. Pt reports she lives at home with her dtr and her family. Pt reports she is independent with ADL's/IADL's, drives, and works 8hrs/wk at [employer]. Pt reports she has walker at home but ambulates unassisted at baseline. Reports no VNA or home services and no hx of STR. Pt reports plan to return home once medically cleared. COC following.  Roque Cash  1:47 PM

## 2021-07-23 NOTE — Nursing Note (Signed)
Melissa NP at bedside and ordered to have clear liquid diet, no red dy for now. Patient will be on colon prep tonight for EGD and colonoscopy tomorrow.

## 2021-07-24 ENCOUNTER — Encounter (HOSPITAL_BASED_OUTPATIENT_CLINIC_OR_DEPARTMENT_OTHER): Admitting: Internal Medicine

## 2021-07-24 ENCOUNTER — Inpatient Hospital Stay (HOSPITAL_BASED_OUTPATIENT_CLINIC_OR_DEPARTMENT_OTHER): Admitting: Anesthesiology

## 2021-07-24 ENCOUNTER — Encounter

## 2021-07-24 ENCOUNTER — Ambulatory Visit: Admit: 2021-07-24 | Payer: MEDICARE | Attending: Gastroenterology | Primary: Family Medicine

## 2021-07-24 LAB — COMPREHENSIVE METABOLIC PANEL
ALT: 19 U/L (ref 0–55)
AST: 15 U/L (ref 6–42)
Albumin: 3 g/dL — ABNORMAL LOW (ref 3.2–5.0)
Alkaline phosphatase: 180 U/L — ABNORMAL HIGH (ref 30–130)
Anion Gap: 10 mmol/L (ref 3–14)
BUN: 8 mg/dL (ref 6–24)
Bilirubin, total: 0.2 mg/dL (ref 0.2–1.2)
CO2 (Bicarbonate): 23 mmol/L (ref 20–32)
Calcium: 8 mg/dL — ABNORMAL LOW (ref 8.5–10.5)
Chloride: 109 mmol/L (ref 98–110)
Creatinine: 0.77 mg/dL (ref 0.55–1.30)
Glucose: 179 mg/dL — ABNORMAL HIGH (ref 70–110)
Potassium: 3 mmol/L — ABNORMAL LOW (ref 3.6–5.2)
Protein, total: 5.9 g/dL — ABNORMAL LOW (ref 6.0–8.4)
Sodium: 142 mmol/L (ref 135–146)
eGFRcr: 76 mL/min/{1.73_m2} (ref >=60)

## 2021-07-24 LAB — CBC WITH DIFFERENTIAL
Basophils %: 0.2 %
Basophils Absolute: 0.02 10*3/uL (ref 0.00–0.22)
Eosinophils %: 0.6 %
Eosinophils Absolute: 0.08 10*3/uL (ref 0.00–0.50)
Hematocrit: 27.4 % — ABNORMAL LOW (ref 32.0–47.0)
Hemoglobin: 7.7 g/dL — ABNORMAL LOW (ref 11.0–16.0)
Immature Granulocytes %: 0.5 %
Immature Granulocytes Absolute: 0.06 10*3/uL (ref 0.00–0.10)
Lymphocyte %: 9.6 %
Lymphocytes Absolute: 1.27 10*3/uL (ref 0.70–4.00)
MCH: 19.8 pg — ABNORMAL LOW (ref 26.0–34.0)
MCHC: 28.1 g/dL — ABNORMAL LOW (ref 31.0–37.0)
MCV: 70.6 fL — ABNORMAL LOW (ref 80.0–100.0)
Monocytes %: 8.3 %
Monocytes Absolute: 1.1 10*3/uL — ABNORMAL HIGH (ref 0.36–0.77)
NRBC %: 0 % (ref 0.0–0.0)
NRBC Absolute: 0 10*3/uL (ref 0.00–2.00)
Neutrophil %: 80.8 %
Neutrophils Absolute: 10.65 10*3/uL — ABNORMAL HIGH (ref 1.50–7.95)
Platelets: 314 10*3/uL (ref 150–400)
RBC: 3.88 M/uL (ref 3.70–5.20)
RDW-CV: 22.1 % — ABNORMAL HIGH (ref 11.5–14.5)
RDW-SD: 53.3 fL — ABNORMAL HIGH (ref 35.0–51.0)
WBC: 13.2 10*3/uL — ABNORMAL HIGH (ref 4.0–11.0)

## 2021-07-24 LAB — BASIC METABOLIC PANEL
Anion Gap: 3 mmol/L (ref 3–14)
BUN: 6 mg/dL (ref 6–24)
CO2 (Bicarbonate): 28 mmol/L (ref 20–32)
Calcium: 8.1 mg/dL — ABNORMAL LOW (ref 8.5–10.5)
Chloride: 112 mmol/L — ABNORMAL HIGH (ref 98–110)
Creatinine: 0.51 mg/dL — ABNORMAL LOW (ref 0.55–1.30)
Glucose: 100 mg/dL (ref 70–110)
Potassium: 3.8 mmol/L (ref 3.6–5.2)
Sodium: 143 mmol/L (ref 135–146)
eGFRcr: 92 mL/min/{1.73_m2} (ref >=60)

## 2021-07-24 LAB — RBC MORPHOLOGY

## 2021-07-24 MED ORDER — potassium chloride CR (Klor-Con M20) ER tablet 40 mEq
20 | Freq: Once | ORAL | Status: AC
Start: 2021-07-24 — End: 2021-07-24
  Administered 2021-07-24: 16:00:00 40 meq via ORAL

## 2021-07-24 MED ORDER — propofol (Diprivan) injection
10 | INTRAVENOUS | Status: DC | PRN
Start: 2021-07-24 — End: 2021-07-24
  Administered 2021-07-24: 21:00:00 30 via INTRAVENOUS
  Administered 2021-07-24: 21:00:00 50 via INTRAVENOUS
  Administered 2021-07-24 (×4): 30 via INTRAVENOUS

## 2021-07-24 MED ORDER — potassium chloride IVPB 10 mEq
10 | INTRAVENOUS | Status: AC
Start: 2021-07-24 — End: 2021-07-24

## 2021-07-24 MED ORDER — lactated Ringer's infusion
INTRAVENOUS | Status: DC | PRN
Start: 2021-07-24 — End: 2021-07-24
  Administered 2021-07-24: 21:00:00 via INTRAVENOUS

## 2021-07-24 MED ORDER — lidocaine PF (Xylocaine) 20 mg/mL (2 %) injection
20 | INTRAMUSCULAR | Status: DC | PRN
Start: 2021-07-24 — End: 2021-07-24
  Administered 2021-07-24: 21:00:00 40 via INTRAVENOUS

## 2021-07-24 MED FILL — AMITRIPTYLINE 50 MG TABLET: 50 50 mg | ORAL | Qty: 1

## 2021-07-24 MED FILL — PANTOPRAZOLE 40 MG INTRAVENOUS SOLUTION: 40 40 mg | INTRAVENOUS | Qty: 40

## 2021-07-24 MED FILL — MIRTAZAPINE 15 MG TABLET: 15 15 mg | ORAL | Qty: 1

## 2021-07-24 MED FILL — POTASSIUM CHLORIDE ER 20 MEQ TABLET,EXTENDED RELEASE(PART/CRYST): 20 20 mEq | ORAL | Qty: 2

## 2021-07-24 MED FILL — PREGABALIN 25 MG CAPSULE: 25 25 mg | ORAL | Qty: 2

## 2021-07-24 MED FILL — LEVOTHYROXINE 50 MCG TABLET: 50 50 mcg | ORAL | Qty: 1

## 2021-07-24 MED FILL — CEFTRIAXONE 1 GRAM SOLUTION FOR INJECTION: 1 1 gram | INTRAMUSCULAR | Qty: 1

## 2021-07-24 MED FILL — MELATONIN 3 MG TABLET: 3 3 mg | ORAL | Qty: 1

## 2021-07-24 NOTE — Anesthesia Post-Procedure Evaluation (Signed)
Patient: [name]    Procedure Summary     Date: 07/24/21 Room / Location: [facility]    Anesthesia Start: 1523 Anesthesia Stop: 1614    Procedures:       EGD      COLONOSCOPY Diagnosis:       Iron deficiency anemia, unspecified iron deficiency anemia type      (Suspected upper gastrointestinal bleeding in patient with unexplained iron deficiency anemia)      (Anemia, ? GI source)    Scheduled Providers: Rhetta MuraFranklin Marinelli, MD Responsible Provider: Leslye PeerVahila Dashay Giesler, MD    Anesthesia Type: MAC ASA Status: 3 - Emergent          Anesthesia Type: MAC    Vitals Value Taken Time   BP 128/59 07/24/21 1648   Temp  07/24/21 1708   Pulse 85 07/24/21 1648   Resp 14 07/24/21 1648   SpO2 94 % 07/24/21 1648       Anesthesia Post Evaluation Note:    Patient location during evaluation: PACU  Patient participation: able to participate  Level of consciousness: arousable  Cardiovascular and Hydration status: stable  Respiratory Status Stable and Airway Patent: yes  Nausea and Vomiting Control Satisfactory: yes  Pain management: adequate     Vitals reviewed: yes  Unplanned ICU Admission: no      There were no known notable events for this encounter.

## 2021-07-24 NOTE — Progress Notes (Signed)
07/23/21 1343   Case Management Initial Assessment   Type of Residence Home   Lives With  Children   Support Systems Children;Family members   Current Patient Responsibilities  Personal care;Work;Driving;Laundry;Housekeeping;Meal prep   Current Home Treatment/Devices/Equipment  Standard walker   Case Management Progress Note:   07/23/21 Pt is a 87 y/o female who was BIBA from [employer] where she works after syncopal episode. Met with pt, introduced COC role. Pt reports she lives at home with her dtr and her family. Pt reports she is independent with ADL's/IADL's, drives, and works 8hrs/wk at [employer]. Pt reports she has walker at home but ambulates unassisted at baseline. Reports no VNA or home services and no hx of STR. Pt reports plan to return home once medically cleared. COC following.  Roque CashElizabeth Garcia  1:47 PM    Case Management Progress Note:   Patient went to main campus for endoscopy at 1pm. PT eval is pending. COC following discharge needs.  Elaine BrooklynSuzanne Alliana Mcauliff, RN  07/24/2021  3:13 PM

## 2021-07-24 NOTE — Care Plan (Signed)
Problem: Adult Inpatient Plan of Care  Goal: Plan of Care Review  Outcome: Ongoing, Progressing  Goal: Patient-Specific Goal (Individualized)  Outcome: Ongoing, Progressing  Goal: Absence of Hospital-Acquired Illness or Injury  Outcome: Ongoing, Progressing   Goal Outcome Evaluation:  Plan of Care Reviewed With: patient

## 2021-07-24 NOTE — Progress Notes (Addendum)
PROGRESS NOTE    Today's Date: 07/24/2021  MRN: [id number]  Name: [name]  DOB: January 02, 1935    Subjective   The patient was seen and examined bedside.  Pt doing okay.  Plan for EGD/Colonscopy today.    Objective   Physical Exam  Constitutional:       Appearance: Normal appearance.   HENT:      Head: Normocephalic and atraumatic.   Eyes:      Extraocular Movements: Extraocular movements intact.      Conjunctiva/sclera: Conjunctivae normal.   Cardiovascular:      Rate and Rhythm: Normal rate and regular rhythm.      Pulses: Normal pulses.   Pulmonary:      Effort: Pulmonary effort is normal.   Abdominal:      General: Abdomen is flat. Bowel sounds are normal.      Palpations: Abdomen is soft.   Musculoskeletal:         General: Normal range of motion.   Skin:     General: Skin is warm.   Neurological:      General: No focal deficit present.      Mental Status: She is alert and oriented to person, place, and time.     Last Recorded Vitals  Blood pressure 125/63, pulse 96, temperature 36.8 C (98.3 F), temperature source Oral, resp. rate 16, height 1.6 m, weight 45.9 kg, SpO2 96 %.  Medications      Start Medication Dose/Rate, Route, Frequency Ordered Stop    07/24/21 0945 [Held by provider]  potassium chloride IVPB 10 mEq        (Held by provider since Thu 07/24/2021 at 1024 by Marlowe Aschoff, MD.Hold Reason: Post-procedureHold Comments: Hold until [facility] recheck post Endoscopy)    10 mEq, IV, Every 1 hour 07/24/21 0933 07/24/21 1344    07/23/21 2100 cefTRIAXone (Rocephin) 1 g in sodium chloride 0.9 % 100 mL IVPB-ADD EASE         1 g, IV, Every 24 hours 07/23/21 2059 07/30/21 2059    07/22/21 2200 mirtazapine (Remeron) tablet 15 mg         15 mg, oral, Nightly 07/22/21 1617 --    07/22/21 2200 melatonin tablet 3 mg         3 mg, oral, Nightly 07/22/21 1626 --    07/22/21 2200 pregabalin (Lyrica) capsule 50 mg         50 mg, oral, Nightly 07/22/21 2146 --    07/22/21 2100 amitriptyline (Elavil) tablet 50 mg         50 mg,  oral, 2 times daily 07/22/21 1617 --    07/22/21 1625 acetaminophen (Tylenol) tablet 650 mg        See Hyperspace for full Linked Orders Report.    650 mg, oral, Every 6 hours PRN 07/22/21 1626 --    07/22/21 1625 acetaminophen (Tylenol) solution 650 mg        See Hyperspace for full Linked Orders Report.    650 mg, oral, Every 6 hours PRN 07/22/21 1626 --    07/22/21 1625 acetaminophen (Tylenol) suppository 650 mg        See Hyperspace for full Linked Orders Report.    650 mg, rect, Every 6 hours PRN 07/22/21 1626 --    07/22/21 1625 ondansetron ODT (Zofran-ODT) disintegrating tablet 4 mg        See Hyperspace for full Linked Orders Report.    4 mg, oral, Every 8 hours PRN 07/22/21  1626 --    07/22/21 1625 ondansetron (Zofran) injection 4 mg        See Hyperspace for full Linked Orders Report.    4 mg, IV, Every 8 hours PRN 07/22/21 1626 --    07/22/21 1625 bisacodyl (Dulcolax) EC tablet 10 mg         10 mg, oral, Daily PRN 07/22/21 1626 --    07/22/21 1620 lactobacillus (Culturelle) capsule 1 capsule         1 capsule, oral, Daily 07/22/21 1617 --    07/22/21 1620 levothyroxine (Synthroid, Levoxyl) tablet 50 mcg         50 mcg, oral, Daily 07/22/21 1617 --    07/22/21 1620 multivitamin with folic acid 1 tablet         1 tablet, oral, Daily 07/22/21 1617 --    07/22/21 1615 pantoprazole (ProtoNix) injection 40 mg         40 mg, IV, Every 12 hours 07/22/21 1614 --               Assessment and Plan:  [name] is a 87 y.o. female presenting with history of gastric outlet obstruction, gastric ulcer s/p Billruth II surgery with revision 2021, stool urge incontinence, anxiety/depression, neuropathy, who presented after a syncopal event while at work at [employer]. She felt dizzy then went to rest on a chair then fainted. No reported injuries. She lost cot control of her bowl but states this is not unexpected given her issues with stool incontinence. She denies any bloody or black stools. Here VSS. Labs show  significant anemia with Hb of 6.7 (down from 7 mid Dec and 9.9 last year).      1) Syncope likely due to severe anemia:  - Monitor on tele  - PT consulted     2) Severe microcytic anemia likely iron deficiency  - GI consulted  - S/P 1 unit PRBC  - Plan for EGD/Colon today    3) Anxiety/depression:   - Pt to continue on Elavil, Remeron    4) Hypothyroidism:   - Pt levothyroxine 50 mg daily    5) Neuropathy:   - Pt to continue with Lyrica    6) Depression  - Pt to continue on elavil and Remeron      7) Hypokalemia  - K 3.0 --> replace and recheck     8) Underweight  - dietician conslted    FACE TO FACE PATIENT COUSELLING / COORDINATING CARE MORE THAN 50% OF ENCOUNTER TIME: YES    TOTAL ENCOUNTER TIME:  20  minutes.    Marlowe Aschoff, MD

## 2021-07-24 NOTE — Other (Signed)
Patient Education   Table of Contents       Colonoscopy, Adult, Care After       Monitored Anesthesia Care, Care After       Upper Endoscopy, Adult, Care After     To view videos and all your education online visit,   [email or web address]   or scan this QR code with your smartphone.                    Colonoscopy, Adult, Care After     After a colonoscopy, it is common to have:       A small amount of blood in your poop (stool) for 24 hours.       Some gas.       Mild cramping or bloating in your belly (abdomen).     Follow these instructions at home:   Your doctor may give you more instructions. If you have problems, contact your doctor.   Eating and drinking            Drink enough fluid to keep your pee (urine) pale yellow.       Follow instructions from your doctor about what you cannot eat or drink.       Return to your normal diet as told by your doctor. Avoid heavy or fried foods that are hard to digest.     Activity         Rest as told by your doctor.       Get up to take short walks every 1 to 2 hours. Ask for help if you feel weak or unsteady.       Return to your normal activities when your doctor says that it is safe.     To help cramping and bloating:            Try walking around.      If told, put heat on your belly. Do this as told by your doctor. Use the heat source that your doctor recommends, such as a moist heat pack or a heating pad.       Place a towel between your skin and the heat source.       Leave the heat on for 20?30 minutes.       Take off the heat if your skin turns bright red. This is very important. If you cannot feel pain, heat, or cold, you have a greater risk of getting burned.       General instructions         If you were given a sedative during your procedure, do not drive or use machines until your doctor says that it is safe. A sedative is a medicine that helps you relax.      For the first 24 hours after the procedure:      Do not  sign important documents.       Do not  drink alcohol.       Do your daily activities more slowly than normal.       Eat foods that are soft and easy to digest.       Take over-the-counter and prescription medicines only as told by your doctor.       Keep all follow-up visits.       Contact a doctor if:         You have blood in your poop 2?3 days after the procedure.     Get help right away  if:         You have more than a small amount of blood in your poop.       You see large clumps of tissue (blood clots) in your poop.       Your belly is swollen.       You feel like you may vomit (nauseous).       You vomit.       You have a fever.       You have belly pain that gets worse, and medicine does not help your pain.     These symptoms may be an emergency. Get help right away. Call 911.      Do not wait to see if the symptoms will go away.      Do not drive yourself to the hospital.     Summary         After a colonoscopy, it is common to have a small amount of blood in your poop. You may also have mild cramping and bloating in your belly.       If you were given a sedative during your procedure, do not drive or use machines until your doctor says that it is safe. A sedative is a medicine that helps you relax.       Get help right away if you have a lot of blood in your poop, feel like you may vomit, have a fever, or have more belly pain.     This information is not intended to replace advice given to you by your health care provider. Make sure you discuss any questions you have with your health care provider.     Document Released: 01/20/2012Document Revised: 08/10/2022Document Reviewed: 02/26/2021     Elsevier Patient Education ? 2022 Else[employer] Inc[employer]         Monitored Anesthesia Care, Care After     This sheet gives you information about how to care for yourself after your procedure. Your health care provider may also give you more specific instructions. If you have problems or questions, contact your health care provider.   What can I expect after  the procedure?    After the procedure, it is common to have:       Tiredness.       Forgetfulness about what happened after the procedure.       Impaired judgment for important decisions.       Nausea or vomiting.       Some difficulty with balance.     Follow these instructions at home:   For the time period you were told by your health care provider:               Rest as needed.      Do not  participate in activities where you could fall or become injured.      Do not  drive or use machinery.      Do not  drink alcohol.      Do not  take sleeping pills or medicines that cause drowsiness.      Do not  make important decisions or sign legal documents.      Do not  take care of children on your own.       Eating and drinking         Follow the diet that is recommended by your health care provider.       Drink enough fluid to keep your urine  pale yellow.      If you vomit:       Drink water, juice, or soup when you can drink without vomiting.       Make sure you have little or no nausea before eating solid foods.     General instructions         Have a responsible adult stay with you for the time you are told. It is important to have someone help care for you until you are awake and alert.       Take over-the-counter and prescription medicines only as told by your health care provider.      If you have sleep apnea, surgery and certain medicines can increase your risk for breathing problems. Follow instructions from your health care provider about wearing your sleep device:       Anytime you are sleeping, including during daytime naps.       While taking prescription pain medicines, sleeping medicines, or medicines that make you drowsy.       Avoid smoking.       Keep all follow-up visits as told by your health care provider. This is important.       Contact a health care provider if:         You keep feeling nauseous or you keep vomiting.       You feel light-headed.       You are still sleepy or having trouble with  balance after 24 hours.       You develop a rash.       You have a fever.       You have redness or swelling around the IV site.     Get help right away if:         You have trouble breathing.       You have new-onset confusion at home.     Summary         For several hours after your procedure, you may feel tired. You may also be forgetful and have poor judgment.       Have a responsible adult stay with you for the time you are told. It is important to have someone help care for you until you are awake and alert.       Rest as told. Do not  drive or operate machinery. Do not  drink alcohol or take sleeping pills.       Get help right away if you have trouble breathing, or if you suddenly become confused.     This information is not intended to replace advice given to you by your health care provider. Make sure you discuss any questions you have with your health care provider.     Document Released: 04/09/2017Document Revised: 09/02/2021Document Reviewed: 06/08/2019     Elsevier Patient Education ? 2022 Elsevier Inc.         Upper Endoscopy, Adult, Care After     This sheet gives you information about how to care for yourself after your procedure. Your health care provider may also give you more specific instructions. If you have problems or questions, contact your health care provider.   What can I expect after the procedure?    After the procedure, it is common to have:       A sore throat.       Mild stomach pain or discomfort.       Bloating.       Nausea.  Follow these instructions at home:            Follow instructions from your health care provider about what to eat or drink after your procedure.       Return to your normal activities as told by your health care provider. Ask your health care provider what activities are safe for you.       Take over-the-counter and prescription medicines only as told by your health care provider.       If you were given a sedative during the procedure, it can affect you  for several hours. Do not  drive or operate machinery until your health care provider says that it is safe.       Keep all follow-up visits as told by your health care provider. This is important.       Contact a health care provider if you have:         A sore throat that lasts longer than one day.       Trouble swallowing.     Get help right away if:         You vomit blood or your vomit looks like coffee grounds.      You have:       A fever.       Bloody, black, or tarry stools.       A severe sore throat or you cannot swallow.       Difficulty breathing.       Severe pain in your chest or abdomen.     Summary         After the procedure, it is common to have a sore throat, mild stomach discomfort, bloating, and nausea.       If you were given a sedative during the procedure, it can affect you for several hours. Do not  drive or operate machinery until your health care provider says that it is safe.       Follow instructions from your health care provider about what to eat or drink after your procedure.       Return to your normal activities as told by your health care provider.     This information is not intended to replace advice given to you by your health care provider. Make sure you discuss any questions you have with your health care provider.     Document Released: 06/18/2013Document Revised: 10/23/2020Document Reviewed: 12/06/2017     Elsevier Patient Education ? 2022 Elsevier Inc.

## 2021-07-24 NOTE — Anesthesia Pre-Procedure Evaluation (Signed)
Patient: [name]    Procedure Information     Date/Time: 07/24/21 1430    Scheduled providers: Rhetta Mura, MD    Procedures:       EGD      COLONOSCOPY    Location: [facility]          Relevant Problems   Anesthesia   (+) Hx of carcinoid syndrome      GI   (+) Peptic ulcer disease      Endo   (+) Hypothyroid      Hematology   (+) Iron deficiency anemia      Other   (+) Neuropathy       Clinical information reviewed:   Tobacco  Allergies  Meds   Med Hx  Surg Hx   Fam Hx  Soc Hx         Physical Exam    Airway  Mallampati: I     Cardiovascular   Rhythm: regular     Dental    Pulmonary    Abdominal    General   Alert                 Anesthesia Plan    ASA 3 - emergent   NPO status verified    MAC     Airway: natural airway  Monitoring: standard monitors      Anesthetic plan and risks discussed with patient.

## 2021-07-25 LAB — COMPREHENSIVE METABOLIC PANEL
ALT: 17 U/L (ref 0–55)
AST: 12 U/L (ref 6–42)
Albumin: 2.4 g/dL — ABNORMAL LOW (ref 3.2–5.0)
Alkaline phosphatase: 166 U/L — ABNORMAL HIGH (ref 30–130)
Anion Gap: 8 mmol/L (ref 3–14)
BUN: 6 mg/dL (ref 6–24)
Bilirubin, total: 0.2 mg/dL (ref 0.2–1.2)
CO2 (Bicarbonate): 25 mmol/L (ref 20–32)
Calcium: 7.9 mg/dL — ABNORMAL LOW (ref 8.5–10.5)
Chloride: 111 mmol/L — ABNORMAL HIGH (ref 98–110)
Creatinine: 0.57 mg/dL (ref 0.55–1.30)
Glucose: 104 mg/dL (ref 70–110)
Potassium: 3.4 mmol/L — ABNORMAL LOW (ref 3.6–5.2)
Protein, total: 5.2 g/dL — ABNORMAL LOW (ref 6.0–8.4)
Sodium: 144 mmol/L (ref 135–146)
eGFRcr: 90 mL/min/{1.73_m2} (ref >=60)

## 2021-07-25 LAB — CBC WITH DIFFERENTIAL
Basophils %: 0.2 %
Basophils Absolute: 0.01 10*3/uL (ref 0.00–0.22)
Eosinophils %: 4.2 %
Eosinophils Absolute: 0.24 10*3/uL (ref 0.00–0.50)
Hematocrit: 24.5 % — ABNORMAL LOW (ref 32.0–47.0)
Hemoglobin: 6.8 g/dL — CL (ref 11.0–16.0)
Immature Granulocytes %: 0.4 %
Immature Granulocytes Absolute: 0.02 10*3/uL (ref 0.00–0.10)
Lymphocyte %: 22.4 %
Lymphocytes Absolute: 1.27 10*3/uL (ref 0.70–4.00)
MCH: 19.4 pg — ABNORMAL LOW (ref 26.0–34.0)
MCHC: 27.8 g/dL — ABNORMAL LOW (ref 31.0–37.0)
MCV: 69.8 fL — ABNORMAL LOW (ref 80.0–100.0)
MPV: 11.3 fL (ref 9.1–12.4)
Monocytes %: 12.9 %
Monocytes Absolute: 0.73 10*3/uL (ref 0.36–0.77)
NRBC %: 0 % (ref 0.0–0.0)
NRBC Absolute: 0 10*3/uL (ref 0.00–2.00)
Neutrophil %: 59.9 %
Neutrophils Absolute: 3.4 10*3/uL (ref 1.50–7.95)
Platelets: 286 10*3/uL (ref 150–400)
RBC: 3.51 M/uL — ABNORMAL LOW (ref 3.70–5.20)
RDW-CV: 22.4 % — ABNORMAL HIGH (ref 11.5–14.5)
RDW-SD: 52.7 fL — ABNORMAL HIGH (ref 35.0–51.0)
WBC: 5.7 10*3/uL (ref 4.0–11.0)

## 2021-07-25 LAB — PREPARE RBC, LEUKOREDUCED
Barcoded ABO/Rh: 6200
Blood Expiration Date: 202301122359
Dispense Status: TRANSFUSED
Product Blood Type: A POS

## 2021-07-25 LAB — HEMOGLOBIN AND HEMATOCRIT, BLOOD
Hematocrit: 29.4 % — ABNORMAL LOW (ref 32.0–47.0)
Hemoglobin: 8.5 g/dL — ABNORMAL LOW (ref 11.0–16.0)

## 2021-07-25 MED ORDER — potassium chloride CR (Klor-Con M20) ER tablet 20 mEq
20 | Freq: Once | ORAL | Status: AC
Start: 2021-07-25 — End: 2021-07-25
  Administered 2021-07-25: 20:00:00 20 meq via ORAL

## 2021-07-25 MED FILL — PANTOPRAZOLE 40 MG INTRAVENOUS SOLUTION: 40 40 mg | INTRAVENOUS | Qty: 40

## 2021-07-25 MED FILL — LACTOBACILLUS RHAMNOSUS GG 10 BILLION CELL CAPSULE: 10 10 billion cell | ORAL | Qty: 1

## 2021-07-25 MED FILL — MULTIVITAMIN WITH FOLIC ACID 400 MCG TABLET: 400 400 mcg | ORAL | Qty: 1

## 2021-07-25 MED FILL — MELATONIN 3 MG TABLET: 3 3 mg | ORAL | Qty: 1

## 2021-07-25 MED FILL — POTASSIUM CHLORIDE ER 20 MEQ TABLET,EXTENDED RELEASE(PART/CRYST): 20 20 mEq | ORAL | Qty: 1

## 2021-07-25 MED FILL — CEFTRIAXONE IVPB 1 G ADD-EASE IN 100 ML NS: 1 1.0000 g | INTRAMUSCULAR | Qty: 1

## 2021-07-25 MED FILL — PREGABALIN 25 MG CAPSULE: 25 25 mg | ORAL | Qty: 2

## 2021-07-25 MED FILL — AMITRIPTYLINE 50 MG TABLET: 50 50 mg | ORAL | Qty: 1

## 2021-07-25 MED FILL — LEVOTHYROXINE 50 MCG TABLET: 50 50 mcg | ORAL | Qty: 1

## 2021-07-25 MED FILL — MIRTAZAPINE 15 MG TABLET: 15 15 mg | ORAL | Qty: 1

## 2021-07-25 NOTE — Progress Notes (Signed)
Physical Therapy Evaluation    Patient Name: [name]  MRN: [id number]  DOB: 07-13-1935  Evaluation Date: 07/25/2021    Subjective   HPI/Hospital Course:   Patient is an 87 y.o.femalepresenting with history of gastric outlet obstruction, gastric ulcer s/p Billruth II surgery with revision 2021, stool urge incontinence, anxiety/depression, neuropathy, who presented 1/3  after a syncopal event while at work at [employer]. She felt dizzy then went to rest on a chair then fainted. No reported injuries. She lost cot control of her bowl but states this is not unexpected given her issues with stool incontinence. show significant anemia with Hb of 6.7 (down from 7 mid Dec and 9.9 last year). s/p EGD/colonoscopy 1/5. Dx Severe microcytic anemia likely iron deficiency  Patient Active Problem List   Diagnosis   . Hx of carcinoid syndrome   . Hypothyroid   . Insomnia   . Iron deficiency anemia   . Neuropathy   . Peptic ulcer disease   . Skin cancer, basal cell   . Acquired short bowel syndrome   . At risk for falls   . Basal cell carcinoma (BCC) of abdomen   . Gastrointestinal hypomotility   . History of partial gastrectomy   . Incontinence of feces   . Postural syncope       Past Medical History:   Diagnosis Date   . Colon cancer (CMS/HCC)    . Depression    . Duodenal ulcer    . GERD (gastroesophageal reflux disease)    . Hx of carcinoid syndrome    . Hypothyroid    . Insomnia    . Iron deficiency anemia    . Neuropathy    . Peptic ulcer disease    . Skin cancer, basal cell        Past Surgical History:   Procedure Laterality Date   . COLONOSCOPY     . ESOPHAGOGASTRODUODENOSCOPY  11/24/2019   . ESOPHAGOGASTRODUODENOSCOPY  04/05/2020   . EXPLORATORY LAPAROTOMY  03/12/2020    with partial gastrectomy and redo of Billroth II   . EXPLORATORY LAPAROTOMY  01/2019    with Cheree Ditto patch repair of duodenal ulcer and appendectomy   . LYSIS OF ADHESIONS  02/20/2020    with Billroth II   . RIGHT COLECTOMY  01/2019        Objective     General Information  General Info  PT Received On: 07/25/21  Following Therapy Session:: Call bell within reach, Patient in Bed, Nursing Staff Aware of Patient Location  Plan of Care Reviewed With:: Patient, RN/Charge Nurse  Recommended mobility with nursing staff: amb hallway no AD  Eval Information  Type of Evaluation: Initial Evaluation    Precautions       Home Living  Was Patient Admitted from STR?: No  Lives With: Family  Type of Home: House  Home Layout: One Level, Performs ADL's on One Level  Number of Stairs to Enter Home: 2  Number of Stairs Within Home: 0  Bathroom Shower/Tub: Medical sales representative: Standard    Prior Level of Function  Information Provided By: Patient  Independent at Baseline: All Museum/gallery exhibitions officer, All Community Mobility, All ADL's/IADL's, Driving, Ambulation, Without Device  Work Status: Currently Working  Occupational Profile/Social History: 8 hrs/week at [employer]    Pain  Pain Assessment: No/denies pain    Vital Signs       Cognition  Overall Cognitive Status: Within Functional Limits    Integumentary  ROM/Strength  PROM Right Lower Extremity  Overall RLE PROM: WFL  PROM Left Lower Extremity  Overall LLE PROM: WFL  Strength Right Lower Extremity  RLE Overall Strength: WFL  Strength Left Lower Extremity  LLE Overall Strength: WFL    Neuromuscular Assessment  Sensation: WFL  Vision: Comments (glasses at baseline)         Mobility Assessment  Bed Position: HOB elevated  Supine to Sit: Independent  Sit to Supine: Independent  Scooting: Independent  Bed Mobility Comments: denied dizziness sitting EOB    Sit to Stand: Supervision  Stand to Sit: Supervision  Assistive Device: None  Transfers Comments: denied dizziness in standing    Distance (Feet): 100  Assistance Level: Supervision  Assistive Device: None  Gait Characteristics: decreased arm swing, decreased cadence, decreased step length, normal BOS  Ambulation Comments: patient ambulated in  hallway with close sup and denied any c/o dizziness or lightheadedness              Sitting Balance - Static: good  Standing Balance - Static: good    AMPAC - (Basic Mobility Inpatient Short Form) How much help from another person do you currently need?  Turning from your back to your side while in a flat bed without using bedrails?: 4 - None  Moving from lying on your back to sitting on the side of a flat bed without using bedrails?: 4 - None  Moving to and from a bed to a chair (including a wheelchair)?: 4 - None  Standing up from a chair using your arms (e.g., wheelchair, or bedside chair)?: 4 - None  To walk in hospital room?: 4 - None  Climbing 3-5 steps with a railing?: 3 - A little  Raw Score: 23         Education  Education Provided: Role of PT/Plan of Care, Discharge Planning, Mobility Training, Safety Awareness/Fall Risk  Education Provided to: Patient  Teaching Method: Discussion, Demonstration  Barriers to learning: None evident  Learning Evaluation: Verbalizes understanding    Assessment   Patient seen for PT eval on this date. Patient recieved 1 unit PRBC today and stated she was feeling better. She tolerated ambulating in hallway with no AD and close sup, denied any symptoms throughout therapy session. Patient did report that she has been getting dizzy and home and at work, worsening recently. ? If related to current iron deficiency and anemia. Discussed with pt DC planning, patient agreeable to home VNA, does not appear to need home PT. edu on sitting EOB for min at home prior to mobilization. Will continue to follow while in house to ensure patient does not lose any strength due to prolonged bed rest.     Patient presents with Impaired gait, Impaired activity tolerance, Functional Mobility deficit       Prognosis: Good       Goals  Short Term Goals  Date Established/Amended: 07/25/21  Goal timeframe: 1 week  Transfers goal: patient will be independent in all transfers  Ambulation goal: patient will  ambulate 358f t independently  Stairs goal: patient will perform 2 stairs independently         Plan   Plan: Continued Skilled Inpatient PT Services   Treatment Interventions: Therapeutic exercise, Assistive device training, Functional mobility training, Gait training, Stair training, Patient/family education, Energy conservation  PT Frequency and Duration: 3-5 x/week until Discharge  Treatment:      Recommendations  Anticipate Discharge to: Home with support/services  Discharge Assist/Support Needed: Intermittent  Supervision  Home services recommended: Other (VNA)  Recommended mobility with nursing staff: amb hallway no AD  Landis Martins, PT  [place] lic # [id number]

## 2021-07-25 NOTE — Progress Notes (Signed)
PROGRESS NOTE    Today's Date: 07/25/2021  MRN: [id number]  Name: [name]  DOB: April 26, 1935    Subjective   The patient was seen and examined bedside.  Pt doing okay.  Noted drop in hemoglobin today..    Objective   Physical Exam  Constitutional:       Appearance: Normal appearance.   HENT:      Head: Normocephalic and atraumatic.   Eyes:      Extraocular Movements: Extraocular movements intact.      Conjunctiva/sclera: Conjunctivae normal.   Cardiovascular:      Rate and Rhythm: Normal rate and regular rhythm.      Pulses: Normal pulses.   Pulmonary:      Effort: Pulmonary effort is normal.   Abdominal:      General: Abdomen is flat. Bowel sounds are normal.      Palpations: Abdomen is soft.   Musculoskeletal:         General: Normal range of motion.   Skin:     General: Skin is warm.   Neurological:      General: No focal deficit present.      Mental Status: She is alert and oriented to person, place, and time.     Last Recorded Vitals  Blood pressure 126/65, pulse 80, temperature 37 C (98.6 F), temperature source Oral, resp. rate 19, height 1.6 m, weight 45.9 kg, SpO2 97 %.  Medications      Start Medication Dose/Rate, Route, Frequency Ordered Stop    07/24/21 0945 [Held by provider]  potassium chloride IVPB 10 mEq        (Held by provider since Thu 07/24/2021 at 1024 by Marlowe AschoffMukti Patel, MD.Hold Reason: Post-procedureHold Comments: [facility])    10 mEq, IV, Every 1 hour 07/24/21 0933 07/24/21 1344    07/23/21 2100 cefTRIAXone (Rocephin) 1 g in sodium chloride 0.9 % 100 mL IVPB-ADD EASE         1 g, IV, Every 24 hours 07/23/21 2059 07/30/21 2059    07/22/21 2200 mirtazapine (Remeron) tablet 15 mg         15 mg, oral, Nightly 07/22/21 1617 --    07/22/21 2200 melatonin tablet 3 mg         3 mg, oral, Nightly 07/22/21 1626 --    07/22/21 2200 pregabalin (Lyrica) capsule 50 mg         50 mg, oral, Nightly 07/22/21 2146 --    07/22/21 2100 amitriptyline (Elavil) tablet 50 mg         50 mg,  oral, 2 times daily 07/22/21 1617 --    07/22/21 1625 acetaminophen (Tylenol) tablet 650 mg        See Hyperspace for full Linked Orders Report.    650 mg, oral, Every 6 hours PRN 07/22/21 1626 --    07/22/21 1625 acetaminophen (Tylenol) solution 650 mg        See Hyperspace for full Linked Orders Report.    650 mg, oral, Every 6 hours PRN 07/22/21 1626 --    07/22/21 1625 acetaminophen (Tylenol) suppository 650 mg        See Hyperspace for full Linked Orders Report.    650 mg, rect, Every 6 hours PRN 07/22/21 1626 --    07/22/21 1625 ondansetron ODT (Zofran-ODT) disintegrating tablet 4 mg        See Hyperspace for full Linked Orders Report.    4 mg, oral, Every 8 hours PRN  07/22/21 1626 --    07/22/21 1625 ondansetron (Zofran) injection 4 mg        See Hyperspace for full Linked Orders Report.    4 mg, IV, Every 8 hours PRN 07/22/21 1626 --    07/22/21 1625 bisacodyl (Dulcolax) EC tablet 10 mg         10 mg, oral, Daily PRN 07/22/21 1626 --    07/22/21 1620 lactobacillus (Culturelle) capsule 1 capsule         1 capsule, oral, Daily 07/22/21 1617 --    07/22/21 1620 levothyroxine (Synthroid, Levoxyl) tablet 50 mcg         50 mcg, oral, Daily 07/22/21 1617 --    07/22/21 1620 multivitamin with folic acid 1 tablet         1 tablet, oral, Daily 07/22/21 1617 --    07/22/21 1615 pantoprazole (ProtoNix) injection 40 mg         40 mg, IV, Every 12 hours 07/22/21 1614 --               Assessment and Plan:  RUQAYA Garcia is a 87 y.o. female presenting with history of gastric outlet obstruction, gastric ulcer s/p Billruth II surgery with revision 2021, stool urge incontinence, anxiety/depression, neuropathy, who presented after a syncopal event while at work at [employer]. She felt dizzy then went to rest on a chair then fainted. No reported injuries. She lost cot control of her bowl but states this is not unexpected given her issues with stool incontinence. She denies any bloody or black stools. Here VSS. Labs show  significant anemia with Hb of 6.7 (down from 7 mid Dec and 9.9 last year).      1) Syncope likely due to severe anemia:  - Monitor on tele  - PT consulted     2) Severe microcytic anemia likely iron deficiency  - GI consulted  - S/P 1 unit PRBC and will give another unit today since h/h is 6.8/24.5  - S/P EGD/Colonscopy:       -  Patent Billroth II gastrojejunostomy was found, characterized by healthy            appearing mucosa.      -  Normal esophagus.      -  No specimens collected. See colonoscopy report from today.      -   Patent end-to-side ileo-colonic anastomosis, characterized by healthy            appearing mucosa.       -  Diverticulosis in the descending colon and in the sigmoid colon.    3) Anxiety/depression:   - Pt to continue on Elavil, Remeron    4) Hypothyroidism:   - Pt levothyroxine 50 mg daily    5) Neuropathy:   - Pt to continue with Lyrica    6) Depression  - Pt to continue on elavil and Remeron      7) Hypokalemia  - K 3.4 --> replace and recheck     8) Underweight  - dietician conslted    FACE TO FACE PATIENT COUSELLING / COORDINATING CARE MORE THAN 50% OF ENCOUNTER TIME: YES    TOTAL ENCOUNTER TIME:  20  minutes.    Marlowe Aschoff, MD

## 2021-07-25 NOTE — Progress Notes (Signed)
 07/23/21 1343   Case Management Initial Assessment   Type of Residence Home   Lives With  Children   Support Systems Children;Family members   Current Patient Responsibilities  Personal care;Work;Driving;Laundry;Housekeeping;Meal prep   Current Home Treatment/Devices/Equipment  Standard walker   Case Management Progress Note:   07/23/21 Pt is a 87 y/o female who was BIBA from [employer] where she works after syncopal episode. Met with pt, introduced COC role. Pt reports she lives at home with her dtr and her family. Pt reports she is independent with ADL's/IADL's, drives, and works 8hrs/wk at [employer]. Pt reports she has walker at home but ambulates unassisted at baseline. Reports no VNA or home services and no hx of STR. Pt reports plan to return home once medically cleared. COC following.  Roque Cash  1:47 PM    Case Management Progress Note:   Patient went to main campus for endoscopy at 1pm. PT eval is pending. COC following discharge needs.  Elwanda Brooklyn, RN  07/24/2021  3:13 PM    Case Management Progress Note:   Case discussed in rounds. Had colonoscopy yesterday. H&H 6.8/24.5 and getting another  1 unit PRBCs. PT eval pending. COC following.  Elwanda Brooklyn, RN  07/25/2021  3:40 PM

## 2021-07-25 NOTE — Nursing Note (Signed)
[facility] transfer received to room 403. Oriented to room and call light system. Side rails up x2, call bell in reach, bed in low position. No signs of distress or discomfort noted.  Report given to oncoming RN.

## 2021-07-26 LAB — CBC WITH DIFFERENTIAL
Basophils %: 0.4 %
Basophils Absolute: 0.03 10*3/uL (ref 0.00–0.22)
Eosinophils %: 3.9 %
Eosinophils Absolute: 0.29 10*3/uL (ref 0.00–0.50)
Hematocrit: 32.5 % (ref 32.0–47.0)
Hemoglobin: 9.3 g/dL — ABNORMAL LOW (ref 11.0–16.0)
Immature Granulocytes %: 0.3 %
Immature Granulocytes Absolute: 0.02 10*3/uL (ref 0.00–0.10)
Lymphocyte %: 26.7 %
Lymphocytes Absolute: 1.98 10*3/uL (ref 0.70–4.00)
MCH: 20.9 pg — ABNORMAL LOW (ref 26.0–34.0)
MCHC: 28.6 g/dL — ABNORMAL LOW (ref 31.0–37.0)
MCV: 73 fL — ABNORMAL LOW (ref 80.0–100.0)
Monocytes %: 10.8 %
Monocytes Absolute: 0.8 10*3/uL — ABNORMAL HIGH (ref 0.36–0.77)
NRBC %: 0 % (ref 0.0–0.0)
NRBC Absolute: 0 10*3/uL (ref 0.00–2.00)
Neutrophil %: 57.9 %
Neutrophils Absolute: 4.29 10*3/uL (ref 1.50–7.95)
Platelets: 311 10*3/uL (ref 150–400)
RBC: 4.45 M/uL (ref 3.70–5.20)
RDW-CV: 24.4 % — ABNORMAL HIGH (ref 11.5–14.5)
RDW-SD: 58.9 fL — ABNORMAL HIGH (ref 35.0–51.0)
WBC: 7.4 10*3/uL (ref 4.0–11.0)

## 2021-07-26 LAB — COMPREHENSIVE METABOLIC PANEL
ALT: 22 U/L (ref 0–55)
AST: 17 U/L (ref 6–42)
Albumin: 2.8 g/dL — ABNORMAL LOW (ref 3.2–5.0)
Alkaline phosphatase: 168 U/L — ABNORMAL HIGH (ref 30–130)
Anion Gap: 6 mmol/L (ref 3–14)
BUN: 11 mg/dL (ref 6–24)
Bilirubin, total: 0.2 mg/dL (ref 0.2–1.2)
CO2 (Bicarbonate): 29 mmol/L (ref 20–32)
Calcium: 8.5 mg/dL (ref 8.5–10.5)
Chloride: 107 mmol/L (ref 98–110)
Creatinine: 0.61 mg/dL (ref 0.55–1.30)
Glucose: 103 mg/dL — ABNORMAL HIGH (ref 70–99)
Potassium: 4.6 mmol/L (ref 3.6–5.2)
Protein, total: 6.2 g/dL (ref 6.0–8.4)
Sodium: 142 mmol/L (ref 135–146)
eGFRcr: 88 mL/min/{1.73_m2} (ref >=60)

## 2021-07-26 LAB — RBC MORPHOLOGY

## 2021-07-26 MED ORDER — sulfamethoxazole-trimethoprim (Bactrim DS) 800-160 mg tablet
800-160 | ORAL_TABLET | Freq: Two times a day (BID) | ORAL | 0 refills | Status: AC
Start: 2021-07-26 — End: 2021-07-31

## 2021-07-26 MED FILL — PANTOPRAZOLE 40 MG INTRAVENOUS SOLUTION: 40 40 mg | INTRAVENOUS | Qty: 40

## 2021-07-26 MED FILL — LEVOTHYROXINE 50 MCG TABLET: 50 50 mcg | ORAL | Qty: 1

## 2021-07-26 MED FILL — AMITRIPTYLINE 50 MG TABLET: 50 50 mg | ORAL | Qty: 1

## 2021-07-26 MED FILL — MULTIVITAMIN WITH FOLIC ACID 400 MCG TABLET: 400 400 mcg | ORAL | Qty: 1

## 2021-07-26 MED FILL — PREGABALIN 25 MG CAPSULE: 25 25 mg | ORAL | Qty: 2

## 2021-07-26 MED FILL — MELATONIN 3 MG TABLET: 3 3 mg | ORAL | Qty: 1

## 2021-07-26 MED FILL — LACTOBACILLUS RHAMNOSUS GG 10 BILLION CELL CAPSULE: 10 10 billion cell | ORAL | Qty: 1

## 2021-07-26 MED FILL — CEFTRIAXONE IVPB 1 G ADD-EASE IN 100 ML NS: 1 1.0000 g | INTRAMUSCULAR | Qty: 1

## 2021-07-26 MED FILL — MIRTAZAPINE 15 MG TABLET: 15 15 mg | ORAL | Qty: 1

## 2021-07-26 NOTE — Discharge Instructions (Signed)
reg

## 2021-07-26 NOTE — Nursing Note (Signed)
IV was removed, all paperwork and education was provided to the patient. No further questions at this time. Patient left via wheelchair to the lobby for discharge with all of belongings.

## 2021-07-26 NOTE — Other (Signed)
Patient Education   Table of Contents       Anemia     To view videos and all your education online visit,   [email or web address].elsevier.com/[email or web address]   or scan this QR code with your smartphone.                    Anemia        Anemia is a condition in which there is not enough red blood cells or hemoglobin in the blood. Hemoglobin is a substance in red blood cells that carries oxygen.   When you do not have enough red blood cells or hemoglobin (are anemic), your body cannot get enough oxygen and your organs may not work properly. As a result, you may feel very tired or have other problems.     What are the causes?    Common causes of anemia include:       Excessive bleeding. Anemia can be caused by excessive bleeding inside or outside the body, including bleeding from the intestines or from heavy menstrual periods in females.       Poor nutrition.       Long-lasting (chronic) kidney, thyroid, and liver disease.       Bone marrow disorders, spleen problems, and blood disorders.       Cancer and treatments for cancer.       HIV (human immunodeficiency virus) and AIDS (acquired immunodeficiency syndrome).       Infections, medicines, and autoimmune disorders that destroy red blood cells.     What are the signs or symptoms?    Symptoms of this condition include:       Minor weakness.       Dizziness.       Headache, or difficulties concentrating and sleeping.       Heartbeats that feel irregular or faster than normal (palpitations).       Shortness of breath, especially with exercise.       Pale skin, lips, and nails, or cold hands and feet.       Indigestion and nausea.     Symptoms may occur suddenly or develop slowly. If your anemia is mild, you may not have symptoms.   How is this diagnosed?   This condition is diagnosed based on blood tests, your medical history, and a physical exam. In some cases, a test may be needed in which cells are removed from the soft tissue inside of a bone and looked at under a microscope (bone  marrow biopsy). Your health care provider may also check your stool (feces) for blood and may do additional testing to look for the cause of your bleeding.    Other tests may include:       Imaging tests, such as a CT scan or MRI.       A procedure to see inside your esophagus and stomach (endoscopy).       A procedure to see inside your colon and rectum (colonoscopy).     How is this treated?    Treatment for this condition depends on the cause. If you continue to lose a lot of blood, you may need to be treated at a hospital. Treatment may include:       Taking supplements of iron, vitamin B12, or folic acid.       Taking a hormone medicine (erythropoietin) that can help to stimulate red blood cell growth.       Having a blood transfusion. This may be  needed if you lose a lot of blood.       Making changes to your diet.       Having surgery to remove your spleen.     Follow these instructions at home:         Take over-the-counter and prescription medicines only as told by your health care provider.       Take supplements only as told by your health care provider.       Follow any diet instructions that you were given by your health care provider.       Keep all follow-up visits as told by your health care provider. This is important.     Contact a health care provider if:         You develop new bleeding anywhere in the body.     Get help right away if:         You are very weak.       You are short of breath.       You have pain in your abdomen or chest.       You are dizzy or feel faint.       You have trouble concentrating.       You have bloody stools, black stools, or tarry stools.       You vomit repeatedly or you vomit up blood.     These symptoms may represent a serious problem that is an emergency. Do not wait to see if the symptoms will go away. Get medical help right away. Call your local emergency services (911 in the [place] Do not drive yourself to the hospital.   Summary         Anemia is a condition  in which you do not have enough red blood cells or enough of a substance in your red blood cells that carries oxygen (hemoglobin).       Symptoms may occur suddenly or develop slowly.       If your anemia is mild, you may not have symptoms.       This condition is diagnosed with blood tests, a medical history, and a physical exam. Other tests may be needed.       Treatment for this condition depends on the cause of the anemia.     This information is not intended to replace advice given to you by your health care provider. Make sure you discuss any questions you have with your health care provider.     Document Released: 01/25/2006Document Revised: 11/01/2022Document Reviewed: 06/13/2019     Elsevier Patient Education ? [place].

## 2021-07-26 NOTE — Discharge Summary (Signed)
Inpatient Discharge Summary    BRIEF OVERVIEW  Admitting Provider: Marliss Czar, MD  Discharge Provider: Marlowe Aschoff, MD  Primary Care Physician at Discharge: Cheryle Horsfall, MD [phone]     Admission Date: 07/22/2021     Discharge Date: 07/26/2021    Primary Discharge Diagnosis  Anemia 2/2 to possible GIB  UTI    Discharge Disposition  Home with VNA  Code Status at Discharge: FULL    Active Issues Requiring Follow-up  Pt to follow up with PCP in 1 weeks time  Diet - Cardiac  Activity - As tolerating  CBC/BMp in 1 week  Follow up with GI as OP for capsule endoscopy - they will arrange it    Outpatient Follow-Up  Future Appointments   Date Time Provider Department Center   08/04/2021  2:30 PM Cheryle Horsfall, MD [facility] [place]   12/31/2021 11:30 AM Cheryle Horsfall, MD [facility]       Test Results Pending at Discharge  Pending Labs     Order Current Status    Rainbow draw In process          DETAILS OF HOSPITAL STAY    Presenting Problem/History of Present Illness - As dictated by Dr. Dolores Frame is a 87 y.o. female presenting with history of gastric outlet obstruction, gastric ulcer s/p Billruth II surgery with revision 2021, stool urge incontinence, anxiety/depression, neuropathy, who presented after a syncopal event while at work at [employer]. She felt dizzy then went to rest on a chair then fainted. No reported injuries. She lost cot control of her bowl but states this is not unexpected given her issues with stool incontinence. She denies any bloody or black stools. Here VSS. Labs show significant anemia with Hb of 6.7 (down from 7 mid Dec and 9.9 last year).      Hospital Course  Elaine Felling Johnsonis a 87 y.o.femalepresenting with history of gastric outlet obstruction, gastric ulcer s/p Billruth II surgery with revision 2021, stool urge incontinence, anxiety/depression, neuropathy, who presented after a syncopal event while at work at [employer]. She felt dizzy then went to rest on a  chair then fainted. No reported injuries. She lost cot control of her bowl but states this is not unexpected given her issues with stool incontinence. She denies any bloody or black stools. Here VSS. Labs show significant anemia with Hb of 6.7 (down from 7 mid Dec and 9.9 last year).     1) Syncope likely due to severe anemia:  - Monitor on tele  - PT consulted     2) Severe microcytic anemia likely iron deficiency  - GI consulted  - S/P 2 units PRBC during her stay  - S/P EGD/Colonscopy:   - Patent Billroth II gastrojejunostomy was found, characterized by healthy  appearing mucosa.  - Normal esophagus.  - No specimens collected. See colonoscopy report from today.      -   Patent end-to-side ileo-colonic anastomosis, characterized by healthy  appearing mucosa.  - Diverticulosis in the descending colon and in the sigmoid colon.  - Pt to continue on PPI BID and follow up with GI for capsule endoscopy    3) Anxiety/depression:   - Pt was continued on Elavil, Remeron    4) Hypothyroidism:   - Pt was continued on levothyroxine 50 mg daily    5) Neuropathy:   - Pt was continued on Lyrica    6) Depression  - Pt was continued on  elavil and Remeron      7) Hypokalemia  - Replaced and recheck    8) Underweight  - dietician consulted    9) Deconditioning  - PT consulted who is recommending how with services    Pertinent Test Results:   === 07/22/21 ===  XR CHEST 2 VIEWS  - Impression -  No acute pulmonary abnormality.  Emphysematous changes.  Alexi Otrakji 07/22/2021 3:30 PM    Colonoscopy:     Patent end-to-side ileo-colonic anastomosis, characterized by healthy appearing mucosa.         -  Diverticulosis in the descending colon and in the sigmoid colon.         -  No specimens collected.    EGD:   Patent Billroth II gastrojejunostomy was found, characterized by healthy appearing mucosa.  -  Normal esophagus.  -  No specimens collected. See colonoscopy report from  today.    Physical Exam at Discharge  Discharge Condition: good  Pulse: 83  Resp: 18  BP: (!) 143/72  Temp: 36.4 C (97.5 F)  Weight: 45.9 kg  General appearance: alert and oriented, in no acute distress  Head: normocephalic, without obvious abnormality, atraumatic  Lungs: clear to auscultation bilaterally  Heart: regular rate and rhythm, S1, S2 normal, no murmur, click, rub or gallop  Abdomen: soft, non-tender; bowel sounds normal; no masses, no organomegaly  Extremities: extremities normal, warm and well-perfused; no cyanosis, clubbing, or edema  Skin: Skin color, texture, turgor normal. No rashes or lesions       Your medication list      START taking these medications      Instructions Last Dose Given Next Dose Due   sulfamethoxazole-trimethoprim 800-160 mg tablet  Commonly known as: Bactrim DS      Take 1 tablet by mouth in the morning and at bedtime for 5 days.          CHANGE how you take these medications      Instructions Last Dose Given Next Dose Due   pregabalin 50 mg capsule  Commonly known as: Lyrica  What changed: when to take this      TAKE 1 CAPSULE (50 MG) BY MOUTH IN THE MORNING          CONTINUE taking these medications      Instructions Last Dose Given Next Dose Due   amitriptyline 50 mg tablet  Commonly known as: Elavil           Lactobacillus acidophilus 1 billion cell capsule           levothyroxine 50 mcg tablet  Commonly known as: Synthroid, Levoxyl      Take 1 tablet (50 mcg) by mouth in the morning.       mirtazapine 15 mg tablet  Commonly known as: Remeron      TAKE 1 TABLET BY MOUTH EVERYDAY AT BEDTIME       multivitamin tablet           pantoprazole 40 mg EC tablet  Commonly known as: ProtoNix      TAKE 1 TABLET BY MOUTH TWICE A DAY             Where to Get Your Medications      These medications were sent to [id number] [place], [place] - [address] [facility] [address]  [address], [id number]    Phone: [phone]    sulfamethoxazole-trimethoprim 800-160  mg tablet        >  30 minutes spent on pt care

## 2021-07-28 ENCOUNTER — Other Ambulatory Visit (HOSPITAL_BASED_OUTPATIENT_CLINIC_OR_DEPARTMENT_OTHER)

## 2021-07-28 ENCOUNTER — Encounter (HOSPITAL_BASED_OUTPATIENT_CLINIC_OR_DEPARTMENT_OTHER)

## 2021-07-29 ENCOUNTER — Other Ambulatory Visit (HOSPITAL_BASED_OUTPATIENT_CLINIC_OR_DEPARTMENT_OTHER)

## 2021-07-29 ENCOUNTER — Other Ambulatory Visit

## 2021-07-30 ENCOUNTER — Other Ambulatory Visit (HOSPITAL_BASED_OUTPATIENT_CLINIC_OR_DEPARTMENT_OTHER)

## 2021-08-04 ENCOUNTER — Other Ambulatory Visit: Admit: 2021-08-04 | Payer: MEDICARE | Primary: Family Medicine

## 2021-08-04 ENCOUNTER — Other Ambulatory Visit

## 2021-08-04 ENCOUNTER — Encounter

## 2021-08-04 ENCOUNTER — Ambulatory Visit: Admit: 2021-08-04 | Discharge: 2021-08-04 | Payer: MEDICARE | Attending: Family Medicine | Primary: Family Medicine

## 2021-08-04 VITALS — Wt 99.0 lb

## 2021-08-04 DIAGNOSIS — D509 Iron deficiency anemia, unspecified: Secondary | ICD-10-CM

## 2021-08-04 DIAGNOSIS — R55 Syncope and collapse: Secondary | ICD-10-CM

## 2021-08-04 LAB — CBC WITH DIFFERENTIAL
Basophils %: 0.5 %
Basophils Absolute: 0.05 10*3/uL (ref 0.00–0.22)
Eosinophils %: 2.4 %
Eosinophils Absolute: 0.25 10*3/uL (ref 0.00–0.50)
Hematocrit: 34.3 % (ref 32.0–47.0)
Hemoglobin: 9.8 g/dL — ABNORMAL LOW (ref 11.0–16.0)
Immature Granulocytes %: 0.2 %
Immature Granulocytes Absolute: 0.02 10*3/uL (ref 0.00–0.10)
Lymphocyte %: 22.6 %
Lymphocytes Absolute: 2.35 10*3/uL (ref 0.70–4.00)
MCH: 22.3 pg — ABNORMAL LOW (ref 26.0–34.0)
MCHC: 28.6 g/dL — ABNORMAL LOW (ref 31.0–37.0)
MCV: 78.1 fL — ABNORMAL LOW (ref 80.0–100.0)
Monocytes %: 6.9 %
Monocytes Absolute: 0.72 10*3/uL (ref 0.36–0.77)
NRBC %: 0 % (ref 0.0–0.0)
NRBC Absolute: 0 10*3/uL (ref 0.00–2.00)
Neutrophil %: 67.4 %
Neutrophils Absolute: 7.02 10*3/uL (ref 1.50–7.95)
Platelets: 305 10*3/uL (ref 150–400)
RBC: 4.39 M/uL (ref 3.70–5.20)
RDW-CV: 28.7 % — ABNORMAL HIGH (ref 11.5–14.5)
RDW-SD: 76.7 fL — ABNORMAL HIGH (ref 35.0–51.0)
WBC: 10.4 10*3/uL (ref 4.0–11.0)

## 2021-08-04 LAB — RBC MORPHOLOGY

## 2021-08-04 LAB — IRON, FERRITIN, TIBC
Ferritin: 68.5 ng/mL (ref 10.0–307.0)
Iron Saturation: 22 % (ref 10–50)
Iron: 84 ug/dL (ref 30–180)
TIBC: 378 ug/dL (ref 250–463)

## 2021-08-04 NOTE — Progress Notes (Signed)
[facility]  [facility]  [address]  [address]  [place] [address]  Dept: [phone]  Dept Fax: [phone]     Patient ID: [name] is a 87 y.o. female who presents for Post Discharge Outreach (Passed out at work  dizzy -- needs labs).    Subjective   HPI   Here in follow up.   Syncopal at work last week.    Had significant anemia.   Was transfused 2 units PRBCs.  Feels better after  Blood transfusion.     Up to 99 pounds.   No vomiting.   No diarrhea.  No blood in stool.  No abdominal pain.    Sl headaches.       Mood OK.      No abdominal pain.    Hx of extensive gastric surgery  In the past with known chronic iron deficiency anemia.     Upper and lower endoscopies last week did not show any cause of bleeding    Current Outpatient Medications   Medication Instructions   . amitriptyline (ELAVIL) 50 mg, oral, Nightly   . Lactobacillus acidophilus 1 billion cell capsule 1 capsule, oral, Daily   . levothyroxine (SYNTHROID, LEVOXYL) 50 mcg, oral, Daily   . mirtazapine (Remeron) 15 mg tablet TAKE 1 TABLET BY MOUTH EVERYDAY AT BEDTIME   . multivitamin tablet 1 tablet, oral, Daily   . pantoprazole (ProtoNix) 40 mg EC tablet TAKE 1 TABLET BY MOUTH TWICE A DAY   . pregabalin (LYRICA) 50 mg, oral, Daily     Review of Systems   Constitutional: Negative.    HENT: Negative.    Eyes: Negative.    Respiratory: Negative.    Cardiovascular: Negative.    Gastrointestinal: Positive for heartburn.   Genitourinary: Negative.    Musculoskeletal: Positive for joint pain.   Skin: Negative.    Neurological: Positive for dizziness.     Objective   Visit Vitals  Wt 44.9 kg   BMI 17.54 kg/m   OB Status Postmenopausal   BSA 1.41 m       Physical Exam  Wt 44.9 kg   BMI 17.54 kg/m     General Appearance:    Alert, cooperative, no distress, appears stated age   Head:    Normocephalic, without obvious abnormality, atraumatic   Eyes:    PERRL, conjunctiva/corneas clear, EOM's intact,  fundi     benign, both eyes   Ears:    Normal TM's and external ear canals, both ears   Nose:   Nares normal, septum midline, mucosa normal, no drainage     or sinus tenderness   Throat:   Lips, mucosa, and tongue normal; teeth and gums normal   Neck:   Supple, symmetrical, trachea midline, no adenopathy;     thyroid:  no enlargement/tenderness/nodules; no carotid    bruit or JVD   Back:     Symmetric, no curvature, ROM normal, no CVA tenderness   Lungs:     Clear to auscultation bilaterally, respirations unlabored   Chest Wall:    No tenderness or deformity    Heart:    Regular rate and rhythm, S1 and S2 normal, no murmur, rub    or gallop   Breast Exam:       Abdomen:     Soft, non-tender, bowel sounds active all four quadrants,     no masses, no organomegaly   Genitalia:     Rectal:  Extremities:   Extremities normal, atraumatic, no cyanosis or edema   Pulses:   2+ and symmetric all extremities   Skin:   Skin color, texture, turgor normal, no rashes or lesions   Lymph nodes:   Cervical, supraclavicular, and axillary nodes normal   Neurologic:   CNII-XII intact, normal strength, sensation and reflexes     throughout     Assessment/Plan   [name] was seen today for post discharge outreach.  Syncope, unspecified syncope type  Comments:  related to iron deficiency anemia.   improved with transfusion.    Iron deficiency anemia, unspecified iron deficiency anemia type  Comments:  recheck hct and iron levels.  likely unable to absorb iron due to GI surgeries in the past and will likely need iron infusions  Orders:  -     CBC and differential - WAM and non-WAM; Future  -     IRON, FERRITIN, AND TIBC; Future

## 2021-08-05 ENCOUNTER — Other Ambulatory Visit (INDEPENDENT_AMBULATORY_CARE_PROVIDER_SITE_OTHER): Admitting: Family Medicine

## 2021-08-12 ENCOUNTER — Other Ambulatory Visit (INDEPENDENT_AMBULATORY_CARE_PROVIDER_SITE_OTHER): Admitting: Family Medicine

## 2021-08-18 ENCOUNTER — Other Ambulatory Visit

## 2021-08-18 ENCOUNTER — Other Ambulatory Visit: Admit: 2021-08-18 | Payer: MEDICARE | Primary: Family Medicine

## 2021-08-18 DIAGNOSIS — D509 Iron deficiency anemia, unspecified: Secondary | ICD-10-CM

## 2021-08-18 LAB — RBC MORPHOLOGY

## 2021-08-18 LAB — CBC WITH DIFFERENTIAL
Basophils %: 0.4 %
Basophils Absolute: 0.03 10*3/uL (ref 0.00–0.22)
Eosinophils %: 2 %
Eosinophils Absolute: 0.17 10*3/uL (ref 0.00–0.50)
Hematocrit: 35.9 % (ref 32.0–47.0)
Hemoglobin: 10.3 g/dL — ABNORMAL LOW (ref 11.0–16.0)
Immature Granulocytes %: 0.2 %
Immature Granulocytes Absolute: 0.02 10*3/uL (ref 0.00–0.10)
Lymphocyte %: 25.2 %
Lymphocytes Absolute: 2.14 10*3/uL (ref 0.70–4.00)
MCH: 23.4 pg — ABNORMAL LOW (ref 26.0–34.0)
MCHC: 28.7 g/dL — ABNORMAL LOW (ref 31.0–37.0)
MCV: 81.4 fL (ref 80.0–100.0)
Monocytes %: 10 %
Monocytes Absolute: 0.85 10*3/uL — ABNORMAL HIGH (ref 0.36–0.77)
NRBC %: 0 % (ref 0.0–0.0)
NRBC Absolute: 0 10*3/uL (ref 0.00–2.00)
Neutrophil %: 62.2 %
Neutrophils Absolute: 5.27 10*3/uL (ref 1.50–7.95)
Platelets: 312 10*3/uL (ref 150–400)
RBC: 4.41 M/uL (ref 3.70–5.20)
WBC: 8.5 10*3/uL (ref 4.0–11.0)

## 2021-08-18 LAB — IRON, FERRITIN, TIBC
Ferritin: 19.2 ng/mL (ref 10.0–307.0)
Iron Saturation: 22 % (ref 10–50)
Iron: 76 ug/dL (ref 30–180)
TIBC: 344 ug/dL (ref 250–463)

## 2021-08-19 ENCOUNTER — Other Ambulatory Visit (INDEPENDENT_AMBULATORY_CARE_PROVIDER_SITE_OTHER): Admitting: Family Medicine

## 2021-09-06 ENCOUNTER — Other Ambulatory Visit (INDEPENDENT_AMBULATORY_CARE_PROVIDER_SITE_OTHER): Admitting: Family Medicine

## 2021-09-15 ENCOUNTER — Other Ambulatory Visit (INDEPENDENT_AMBULATORY_CARE_PROVIDER_SITE_OTHER): Admitting: Family Medicine

## 2021-09-15 MED ORDER — levothyroxine (Synthroid, Levoxyl) 50 mcg tablet
50 | ORAL_TABLET | Freq: Every day | ORAL | 1 refills | 90.00000 days | Status: AC
Start: 2021-09-15 — End: 2022-09-15

## 2021-09-17 ENCOUNTER — Other Ambulatory Visit (INDEPENDENT_AMBULATORY_CARE_PROVIDER_SITE_OTHER): Admitting: Family Medicine

## 2021-09-17 ENCOUNTER — Other Ambulatory Visit: Admit: 2021-09-17 | Payer: MEDICARE | Primary: Family Medicine

## 2021-09-17 ENCOUNTER — Other Ambulatory Visit

## 2021-09-17 DIAGNOSIS — D509 Iron deficiency anemia, unspecified: Secondary | ICD-10-CM

## 2021-09-17 LAB — IRON, FERRITIN, TIBC
Ferritin: 10.5 ng/mL (ref 10.0–307.0)
Iron Saturation: 25 % (ref 10–50)
Iron: 103 ug/dL (ref 30–180)
TIBC: 404 ug/dL (ref 250–463)

## 2021-09-17 LAB — CBC WITH DIFFERENTIAL
Basophils %: 0.3 %
Basophils Absolute: 0.02 10*3/uL (ref 0.00–0.22)
Eosinophils %: 2.2 %
Eosinophils Absolute: 0.15 10*3/uL (ref 0.00–0.50)
Hematocrit: 37.9 % (ref 32.0–47.0)
Hemoglobin: 11.4 g/dL (ref 11.0–16.0)
Immature Granulocytes %: 0.3 %
Immature Granulocytes Absolute: 0.02 10*3/uL (ref 0.00–0.10)
Lymphocyte %: 24.6 %
Lymphocytes Absolute: 1.7 10*3/uL (ref 0.70–4.00)
MCH: 25.9 pg — ABNORMAL LOW (ref 26.0–34.0)
MCHC: 30.1 g/dL — ABNORMAL LOW (ref 31.0–37.0)
MCV: 85.9 fL (ref 80.0–100.0)
Monocytes %: 9.1 %
Monocytes Absolute: 0.63 10*3/uL (ref 0.36–0.77)
NRBC %: 0 % (ref 0.0–0.0)
NRBC Absolute: 0 10*3/uL (ref 0.00–2.00)
Neutrophil %: 63.5 %
Neutrophils Absolute: 4.39 10*3/uL (ref 1.50–7.95)
Platelets: 310 10*3/uL (ref 150–400)
RBC: 4.41 M/uL (ref 3.70–5.20)
RDW-CV: 23.9 % — ABNORMAL HIGH (ref 11.5–14.5)
RDW-SD: 72.2 fL — ABNORMAL HIGH (ref 35.0–51.0)
WBC: 6.9 10*3/uL (ref 4.0–11.0)

## 2021-09-17 LAB — RBC MORPHOLOGY

## 2021-09-19 ENCOUNTER — Other Ambulatory Visit (INDEPENDENT_AMBULATORY_CARE_PROVIDER_SITE_OTHER): Admitting: Family Medicine

## 2021-09-19 MED ORDER — mirtazapine (Remeron) 15 mg tablet
15 | ORAL_TABLET | ORAL | 1 refills | 28.00000 days | Status: DC
Start: 2021-09-19 — End: 2021-11-05

## 2021-10-15 MED ORDER — amitriptyline (Elavil) 50 mg tablet
50 | ORAL_TABLET | Freq: Every evening | ORAL | 1 refills | Status: AC
Start: 2021-10-15 — End: 2022-10-15

## 2021-10-22 ENCOUNTER — Other Ambulatory Visit: Admit: 2021-10-22 | Payer: MEDICARE | Primary: Family Medicine

## 2021-10-22 DIAGNOSIS — D509 Iron deficiency anemia, unspecified: Secondary | ICD-10-CM

## 2021-10-22 LAB — CBC WITH DIFFERENTIAL
Basophils %: 0.5 %
Basophils Absolute: 0.03 10*3/uL (ref 0.00–0.22)
Eosinophils %: 2.2 %
Eosinophils Absolute: 0.13 10*3/uL (ref 0.00–0.50)
Hematocrit: 36.4 % (ref 32.0–47.0)
Hemoglobin: 11.4 g/dL (ref 11.0–16.0)
Immature Granulocytes %: 0.2 %
Immature Granulocytes Absolute: 0.01 10*3/uL (ref 0.00–0.10)
Lymphocyte %: 25.9 %
Lymphocytes Absolute: 1.56 10*3/uL (ref 0.70–4.00)
MCH: 28.2 pg (ref 26.0–34.0)
MCHC: 31.3 g/dL (ref 31.0–37.0)
MCV: 90.1 fL (ref 80.0–100.0)
MPV: 11.7 fL (ref 9.1–12.4)
Monocytes %: 9.8 %
Monocytes Absolute: 0.59 10*3/uL (ref 0.36–0.77)
NRBC %: 0 % (ref 0.0–0.0)
NRBC Absolute: 0 10*3/uL (ref 0.00–2.00)
Neutrophil %: 61.4 %
Neutrophils Absolute: 3.71 10*3/uL (ref 1.50–7.95)
Platelets: 254 10*3/uL (ref 150–400)
RBC: 4.04 M/uL (ref 3.70–5.20)
RDW-CV: 15.4 % — ABNORMAL HIGH (ref 11.5–14.5)
RDW-SD: 46.5 fL (ref 35.0–51.0)
WBC: 6 10*3/uL (ref 4.0–11.0)

## 2021-10-23 NOTE — Telephone Encounter (Signed)
From: Seward Carol  To: Dr. Cheryle Horsfall  Sent: 10/23/2021 7:55 AM EDT  Subject: Iron test    Hi, I was under the impression the blood work done was to check my iron level. I don't see that in my recent tests done on 10/22/2021.  Thank you, Chase Caller

## 2021-11-05 MED ORDER — mirtazapine (Remeron) 15 mg tablet
15 | ORAL_TABLET | ORAL | 1 refills | 28.00000 days | Status: AC
Start: 2021-11-05 — End: ?

## 2021-11-24 ENCOUNTER — Other Ambulatory Visit: Admit: 2021-11-24 | Payer: MEDICARE | Primary: Family Medicine

## 2021-11-24 DIAGNOSIS — D509 Iron deficiency anemia, unspecified: Secondary | ICD-10-CM

## 2021-11-24 LAB — CBC WITH DIFFERENTIAL
Basophils %: 0.5 %
Basophils Absolute: 0.03 10*3/uL (ref 0.00–0.22)
Eosinophils %: 1.4 %
Eosinophils Absolute: 0.09 10*3/uL (ref 0.00–0.50)
Hematocrit: 34.2 % (ref 32.0–47.0)
Hemoglobin: 10.7 g/dL — ABNORMAL LOW (ref 11.0–16.0)
Immature Granulocytes %: 0.3 %
Immature Granulocytes Absolute: 0.02 10*3/uL (ref 0.00–0.10)
Lymphocyte %: 25.4 %
Lymphocytes Absolute: 1.58 10*3/uL (ref 0.70–4.00)
MCH: 27.9 pg (ref 26.0–34.0)
MCHC: 31.3 g/dL (ref 31.0–37.0)
MCV: 89.3 fL (ref 80.0–100.0)
MPV: 13 fL — ABNORMAL HIGH (ref 9.1–12.4)
Monocytes %: 10.8 %
Monocytes Absolute: 0.67 10*3/uL (ref 0.36–0.77)
NRBC %: 0 % (ref 0.0–0.0)
NRBC Absolute: 0 10*3/uL (ref 0.00–2.00)
Neutrophil %: 61.6 %
Neutrophils Absolute: 3.84 10*3/uL (ref 1.50–7.95)
Platelets: 268 10*3/uL (ref 150–400)
RBC: 3.83 M/uL (ref 3.70–5.20)
RDW-CV: 13.2 % (ref 11.5–14.5)
RDW-SD: 42.6 fL (ref 35.0–51.0)
WBC: 6.2 10*3/uL (ref 4.0–11.0)

## 2021-11-24 NOTE — Telephone Encounter (Signed)
From: Seward Carol  To: Dr. Cheryle Horsfall  Sent: 11/24/2021 2:16 PM EDT  Subject: Blood tests    When I went for blood work today, I was told there was not an order. They did, however do the same test as was done in April.   They requested you send in an order and fax it to [phone].

## 2021-11-25 MED ORDER — pregabalin (Lyrica) 50 mg capsule
50 | ORAL_CAPSULE | Freq: Every day | ORAL | 3 refills | Status: AC
Start: 2021-11-25 — End: 2022-03-25

## 2021-12-04 ENCOUNTER — Other Ambulatory Visit: Admit: 2021-12-04 | Payer: MEDICARE | Primary: Family Medicine

## 2021-12-04 DIAGNOSIS — D509 Iron deficiency anemia, unspecified: Secondary | ICD-10-CM

## 2021-12-04 LAB — CBC WITH DIFFERENTIAL
Basophils %: 0.1 %
Basophils Absolute: 0.01 10*3/uL (ref 0.00–0.22)
Eosinophils %: 0.9 %
Eosinophils Absolute: 0.08 10*3/uL (ref 0.00–0.50)
Hematocrit: 34.2 % (ref 32.0–47.0)
Hemoglobin: 10.7 g/dL — ABNORMAL LOW (ref 11.0–16.0)
Immature Granulocytes %: 0.2 %
Immature Granulocytes Absolute: 0.02 10*3/uL (ref 0.00–0.10)
Lymphocyte %: 20.4 %
Lymphocytes Absolute: 1.84 10*3/uL (ref 0.70–4.00)
MCH: 27 pg (ref 26.0–34.0)
MCHC: 31.3 g/dL (ref 31.0–37.0)
MCV: 86.1 fL (ref 80.0–100.0)
MPV: 12.4 fL (ref 9.1–12.4)
Monocytes %: 9.2 %
Monocytes Absolute: 0.83 10*3/uL — ABNORMAL HIGH (ref 0.36–0.77)
NRBC %: 0 % (ref 0.0–0.0)
NRBC Absolute: 0 10*3/uL (ref 0.00–2.00)
Neutrophil %: 69.2 %
Neutrophils Absolute: 6.22 10*3/uL (ref 1.50–7.95)
Platelets: 292 10*3/uL (ref 150–400)
RBC: 3.97 M/uL (ref 3.70–5.20)
RDW-CV: 13.3 % (ref 11.5–14.5)
RDW-SD: 41.4 fL (ref 35.0–51.0)
WBC: 9 10*3/uL (ref 4.0–11.0)

## 2021-12-04 LAB — IRON, FERRITIN, TIBC
Ferritin: 7.6 ng/mL — ABNORMAL LOW (ref 10.0–307.0)
Iron Saturation: 11 % (ref 10–50)
Iron: 39 ug/dL (ref 30–180)
TIBC: 341 ug/dL (ref 250–463)

## 2021-12-09 ENCOUNTER — Ambulatory Visit: Admit: 2021-12-09 | Payer: MEDICARE | Primary: Family Medicine

## 2021-12-09 ENCOUNTER — Inpatient Hospital Stay
Admit: 2021-12-09 | Disposition: A | Discharge status: Home / Self Care | Payer: MEDICARE | Attending: Physician Assistant | Primary: Family Medicine

## 2021-12-09 DIAGNOSIS — S61531A Puncture wound without foreign body of right wrist, initial encounter: Secondary | ICD-10-CM

## 2021-12-09 MED ORDER — amoxicillin-pot clavulanate (Augmentin) 875-125 mg tablet
875-125 | ORAL_TABLET | Freq: Two times a day (BID) | ORAL | 0 refills | 7.00000 days | Status: AC
Start: 2021-12-09 — End: 2021-12-16

## 2021-12-09 MED ORDER — cefTRIAXone (Rocephin) 1 gram vial  - Omnicell Override Pull
1 | INTRAMUSCULAR | Status: AC
Start: 2021-12-09 — End: ?

## 2021-12-09 MED ORDER — cefTRIAXone (Rocephin) vial 1,000 mg
250 | Freq: Once | INTRAMUSCULAR | Status: AC
Start: 2021-12-09 — End: 2021-12-09
  Administered 2021-12-09: 14:00:00 1000 mg via INTRAMUSCULAR

## 2021-12-09 MED ORDER — tetanus-diphtheria toxoids-Td (TDVAX) 2-2 Lf unit/0.5 mL vaccine 0.5 mL
2-2 | Freq: Once | INTRAMUSCULAR | Status: DC
Start: 2021-12-09 — End: 2021-12-09

## 2021-12-09 MED ORDER — lidocaine PF (Xylocaine) 10 mg/mL (1 %) injection 21 mg
10 | Freq: Once | INTRAMUSCULAR | Status: AC
Start: 2021-12-09 — End: 2021-12-09
  Administered 2021-12-09: 14:00:00 21 mL

## 2021-12-09 MED ORDER — tetanus-diphtheria toxoids-Td (TDVAX) 2-2 Lf unit/0.5 mL vaccine  - Omnicell Override Pull
2-2 | INTRAMUSCULAR | Status: AC
Start: 2021-12-09 — End: ?

## 2021-12-09 MED ORDER — lidocaine PF (Xylocaine) 10 mg/mL (1 %) injection  - Omnicell Override Pull
10 | INTRAMUSCULAR | Status: AC
Start: 2021-12-09 — End: ?

## 2021-12-09 MED FILL — CEFTRIAXONE 1 GRAM SOLUTION FOR INJECTION: 1 1 gram | INTRAMUSCULAR | Qty: 1

## 2021-12-09 MED FILL — LIDOCAINE (PF) 10 MG/ML (1 %) INJECTION SOLUTION: 10 10 mg/mL (1 %) | INTRAMUSCULAR | Qty: 2.1

## 2021-12-09 MED FILL — LIDOCAINE (PF) 10 MG/ML (1 %) INJECTION SOLUTION: 10 10 mg/mL (1 %) | INTRAMUSCULAR | Qty: 2

## 2021-12-09 MED FILL — TETANUS-DIPHTHERIA TOXOIDS-TD 2 LF UNIT-2 LF UNIT/0.5 ML IM SUSPENSION: 2-2 2-2 Lf unit/0.5 mL | INTRAMUSCULAR | Qty: 0.5

## 2021-12-09 NOTE — ED Provider Notes (Signed)
History  Chief Complaint   Patient presents with   . Pain     Pt was biten by her [place] [employer] dog last night on her right wrist and hand. Pt's hand is red and swollen      87 year old female here with dog bite last night from [place] at home.  .  There is more redness and swelling today, there is no fever.  She was bitten to the dorsal right wrist, there is decreased range of motion, she is unsure of her tetanus status.  The bite was unprovoked.            Past Medical History:   Diagnosis Date   . Colon cancer (CMS/HCC)    . Depression    . Duodenal ulcer    . GERD (gastroesophageal reflux disease)    . Hx of carcinoid syndrome    . Hypothyroid    . Insomnia    . Iron deficiency anemia    . Neuropathy    . Peptic ulcer disease    . Skin cancer, basal cell        Past Surgical History:   Procedure Laterality Date   . APPENDECTOMY     . COLONOSCOPY     . ESOPHAGOGASTRODUODENOSCOPY  11/24/2019   . ESOPHAGOGASTRODUODENOSCOPY  04/05/2020   . EXPLORATORY LAPAROTOMY  03/12/2020    with partial gastrectomy and redo of Billroth II   . EXPLORATORY LAPAROTOMY  01/2019    with Cheree Ditto patch repair of duodenal ulcer and appendectomy   . LYSIS OF ADHESIONS  02/20/2020    with Billroth II   . RIGHT COLECTOMY  01/2019       No family history on file.    Social History     Tobacco Use   . Smoking status: Never   . Smokeless tobacco: Never   Vaping Use   . Vaping status: Never Used     Passive vaping exposure: Yes   Substance Use Topics   . Alcohol use: Not Currently   . Drug use: Never       Review of Systems   All other systems reviewed and are negative.      Physical Exam  Vitals:    12/09/21 0900   BP: 135/62   BP Location: Left arm   Pulse: 92   Resp: 18   Temp: 36.8 C (98.2 F)   TempSrc: Tympanic   SpO2: 99%   Weight: 44.9 kg   Height: 1.6 m       Physical Exam  Vitals and nursing note reviewed.   Musculoskeletal:         General: Swelling, tenderness and signs of injury present. No deformity.   Skin:     General:  Skin is warm and dry.      Findings: Erythema present.      Comments: There is no fluctuance seen there is mild erythema around 2 over the puncture wounds.  There is no red streaking proximally   Neurological:      General: No focal deficit present.      Mental Status: She is alert and oriented to person, place, and time.   Psychiatric:         Mood and Affect: Mood normal.         Behavior: Behavior normal.              XR WRIST RIGHT 3+ VIEWS   Final Result   No evidence of a fracture  or dislocation.      Michael LitterSamuel Tong, MD 12/09/2021 10:06 AM        Labs Reviewed - No data to display    Procedures  Procedures    UC Course  Diagnoses as of 12/09/21 1046   Puncture wound of right wrist, initial encounter   Animal bite   Cellulitis of right wrist       Medical Decision Making  Differential diagnosis includes dog bite, puncture wounds, cellulitis, abscess    Animal bite: acute illness or injury  Cellulitis of right wrist: acute illness or injury  Puncture wound of right wrist, initial encounter: acute illness or injury  Amount and/or Complexity of Data Reviewed  Radiology: ordered.  Discussion of management or test interpretation with external provider(s): A Velcro wrist splint was applied by nursing staff, DT immunization was given, Rocephin IM was given, discussed with family please use serial pictures every other day, elevate warm soaks follow-up if worse,     Risk  Prescription drug management.          Discharge Meds  ED Prescriptions     Medication Sig Dispense Start Date End Date Auth. Provider    amoxicillin-pot clavulanate (Augmentin) 875-125 mg tablet Take 1 tablet by mouth every 12 (twelve) hours for 7 days. 14 tablet 12/09/2021 12/16/2021 Hipolito Bayleyharles Melaine Mcphee, PA          Home Meds  Prior to Admission medications    Medication Sig Start Date End Date Taking? Authorizing Provider   amitriptyline (Elavil) 50 mg tablet Take 1 tablet (50 mg) by mouth at bedtime. 10/15/21 10/15/22 Yes Cheryle HorsfallPaul G Harcourt, MD   levothyroxine  (Synthroid, Levoxyl) 50 mcg tablet Take 1 tablet (50 mcg) by mouth in the morning. 09/15/21 09/15/22 Yes Cheryle HorsfallPaul G Harcourt, MD   mirtazapine (Remeron) 15 mg tablet TAKE 1 TABLET BY MOUTH EVERYDAY AT BEDTIME 11/05/21  Yes Cheryle HorsfallPaul G Harcourt, MD   multivitamin tablet Take 1 tablet by mouth in the morning.   Yes Nurse [facility], RN   pantoprazole (ProtoNix) 40 mg EC tablet TAKE 1 TABLET BY MOUTH TWICE A DAY 11/02/21  Yes Cheryle HorsfallPaul G Harcourt, MD   pregabalin (Lyrica) 50 mg capsule Take 1 capsule (50 mg) by mouth once daily. 11/25/21 03/25/22 Yes Cheryle HorsfallPaul G Harcourt, MD   Lactobacillus acidophilus 1 billion cell capsule Take 1 capsule by mouth in the morning.    Nurse [facility], RN         Total amount of time spent on day of service doing chart review, history and physical exam, order/ review results of testing ordered (if any), patient counseling, documentation: 30 minutes.      Patient encounter note may have been created using voice recognition software and in real time during the office visit. Please excuse any typographical errors that may not have been edited out.            [address]Charles Maple CityLoucraft, GeorgiaPA  12/09/21 1046

## 2021-12-09 NOTE — Other (Signed)
Patient Education   Table of Contents     Puncture Wound     To view videos and all your education online visit,   [email or web address]   or scan this QR code with your smartphone.   Access to this content will expire in one year.                    Puncture Wound     A puncture wound is an injury that is caused by a sharp, thin object that goes through your skin. A puncture wound usually does not leave a large opening in your skin, so it may not bleed a lot. However, when you get a puncture wound, dirt or other materials (foreign bodies) can be forced into your wound and can break off inside. This increases the chance of infection, such as tetanus. There are many sharp, pointed objects that can cause puncture wounds, including teeth, nails, splinters of glass, fishhooks, and needles.    Treatment may include the following steps:     Washing out the wound with a germ-free (sterile) salt-water solution.     Having surgery to open the wound and remove materials from it.     Closing the wound with stitches (sutures).     Covering the wound with antibiotic ointment and a bandage (dressing).     Depending on what caused the injury, you may also need a tetanus shot or a rabies shot.   Follow these instructions at home:   Medicines     Take or apply over-the-counter and prescription medicines only as told by your doctor.     If you were prescribed an antibiotic medicine, take or apply it as told by your doctor. Do not  stop using the antibiotic even if your condition starts to get better.     Bathing     Keep the bandage dry as told by your doctor.    Do not  take baths, swim, or use a hot tub until your doctor approves. Ask your doctor if you may take showers. You may only be allowed to take sponge baths.     Wound care       There are many ways to close and cover a wound. For example, a wound can be closed with stitches, skin glue, or skin tape (adhesive strips). Follow instructions from your doctor about how  to take care of your wound. Make sure you:     Wash your hands with soap and water before and after you change your bandage. If you cannot use soap and water, use hand sanitizer.     Change your bandage as told by your doctor.     Leave stitches, skin glue, or skin tape strips in place. They may need to stay in place for 2 weeks or longer. If tape strips get loose and curl up, you may trim the loose edges. Do not  remove tape strips completely unless your doctor says it is okay.     Clean the wound as told by your doctor.    Do not  scratch or pick at the wound.    Check your wound every day for signs of infection. Watch for:     Redness, swelling, or pain.     Fluid or blood.     Warmth.     Pus or a bad smell.     General instructions     Raise (elevate) the injured area above the level of  your heart while you are sitting or lying down.     If your puncture wound is in your foot, ask your doctor if you need to avoid putting weight on your foot and for how long. Use crutches as told by your doctor.     Keep all follow-up visits as told by your doctor. This is important.       Contact a doctor if:      You got a tetanus shot and you have any of these problems at the injection site:     Swelling.     Very bad pain.     Redness.     Bleeding.     You have a fever.     Your stitches come out.     You notice a bad smell coming from your wound or your bandage.     You notice something coming out of the wound, such as wood or glass.     Medicine does not help your pain.     You have more redness, swelling, or pain at the site of your wound.     You have fluid, blood, or pus coming from your wound.     You notice a change in the color of your skin near your wound.     You need to change the bandage often because fluid, blood, or pus is coming from the wound.     You start to have a new rash.     You start to lose feeling (have numbness) around the wound.     You have warmth around your wound.     Get help right away if:        You have very bad swelling around the wound.     Your pain quickly gets worse and is very bad.     You start to get painful skin lumps.     You have a red streak going away from your wound.    The wound is on your hand or foot and you:     Cannot move a finger or toe like normal.     Notice that your fingers or toes look pale or blue.     Summary       A puncture wound is an injury that is caused by a sharp, thin object that goes through your skin.     Treatment may include washing out the wound, having surgery to open the wound to clean it, closing the wound, and covering the wound with a bandage.     Follow instructions from your doctor about how to take care of your wound.     Contact your doctor if you have more redness, swelling, or pain at the site of your wound.     Keep all follow-up visits as told by your doctor. This is important.     This information is not intended to replace advice given to you by your health care provider. Make sure you discuss any questions you have with your health care provider.     Document Released: 09/26/2009Document Revised: 10/13/2022Document Reviewed: 05/01/2021     Elsevier Patient Education ? 2022 Elsevier Inc[employer]

## 2021-12-31 ENCOUNTER — Ambulatory Visit: Admit: 2021-12-31 | Discharge: 2021-12-31 | Payer: MEDICARE | Attending: Family Medicine | Primary: Family Medicine

## 2021-12-31 ENCOUNTER — Encounter

## 2021-12-31 DIAGNOSIS — D509 Iron deficiency anemia, unspecified: Secondary | ICD-10-CM

## 2021-12-31 NOTE — Progress Notes (Signed)
[facility]ILY [facility]  [facility]  [address]  [address]  [place] [address]  Dept: [phone]  Dept Fax: [phone]     Patient ID: [name] is a 87 y.o. female who presents for follow-up feeling well (No concerns).    Subjective   HPI   Here in follow up.  Hx of chronic anemia related to gastric issues for years. .   No vomiting.  No heartburn.    hct has been stable.   Ferritin starting to drop.   Some constipation alternating  With diarrhea.    No blood in stool.  No abdominal pain.    Sl headaches.     Mood OK.     Sleeping OK.    Hx of extensive gastric surgery  In the past with known chronic iron deficiency anemia.         Current Outpatient Medications   Medication Instructions   . amitriptyline (ELAVIL) 50 mg, oral, Nightly   . Lactobacillus acidophilus 1 billion cell capsule 1 capsule, oral, Daily   . levothyroxine (SYNTHROID, LEVOXYL) 50 mcg, oral, Daily   . mirtazapine (Remeron) 15 mg tablet TAKE 1 TABLET BY MOUTH EVERYDAY AT BEDTIME   . multivitamin tablet 1 tablet, oral, Daily   . pantoprazole (ProtoNix) 40 mg EC tablet TAKE 1 TABLET BY MOUTH TWICE A DAY   . pregabalin (LYRICA) 50 mg, oral, Daily     Review of Systems   Constitutional: Negative.    HENT: Negative.    Eyes: Negative.    Respiratory: Negative.    Cardiovascular: Negative.    Gastrointestinal: Positive for heartburn.   Genitourinary: Negative.    Musculoskeletal: Positive for joint pain.   Skin: Negative.    Neurological: Positive for dizziness.     Objective   Visit Vitals  BP 118/72   Temp 36.9 C (98.5 F)   Wt 44.5 kg   BMI 17.36 kg/m   OB Status Postmenopausal   BSA 1.41 m       Physical Exam  BP 118/72   Temp 36.9 C (98.5 F)   Wt 44.5 kg   BMI 17.36 kg/m     General Appearance:    Alert, cooperative, no distress, appears stated age   Head:    Normocephalic, without obvious abnormality, atraumatic   Eyes:    PERRL, conjunctiva/corneas clear, EOM's intact, fundi     benign, both  eyes   Ears:    Normal TM's and external ear canals, both ears   Nose:   Nares normal, septum midline, mucosa normal, no drainage     or sinus tenderness   Throat:   Lips, mucosa, and tongue normal; teeth and gums normal   Neck:   Supple, symmetrical, trachea midline, no adenopathy;     thyroid:  no enlargement/tenderness/nodules; no carotid    bruit or JVD   Back:     Symmetric, no curvature, ROM normal, no CVA tenderness   Lungs:     Clear to auscultation bilaterally, respirations unlabored   Chest Wall:    No tenderness or deformity    Heart:    Regular rate and rhythm, S1 and S2 normal, no murmur, rub    or gallop   Breast Exam:       Abdomen:     Soft, non-tender, bowel sounds active all four quadrants,     no masses, no organomegaly   Genitalia:     Rectal:  Extremities:   Extremities normal, atraumatic, no cyanosis or edema   Pulses:   2+ and symmetric all extremities   Skin:   Skin color, texture, turgor normal, no rashes or lesions   Lymph nodes:   Cervical, supraclavicular, and axillary nodes normal   Neurologic:   CNII-XII intact, normal strength, sensation and reflexes     throughout     Assessment/Plan   Bulah was seen today for follow-up feeling well.  Iron deficiency anemia, unspecified iron deficiency anemia type  Comments:  monitoring closely.   hx of severe anemia in the past requiring transfusion  Orders:  -     CBC and differential - WAM and non-WAM; Future  -     IRON, FERRITIN, AND TIBC; Future  Gastric outlet obstruction  Comments:  extensive surgery in the past.   better this year   Depression with anxiety  Comments:  stable/ improved   Psychophysiological insomnia  Comments:  better on medications   Acquired hypothyroidism  Comments:  recheck TSH and adjust medications prn  Orders:  -     TSH; Future  Neuropathy  Comments:  on lyrica, helps   Underweight  Comments:  encourage nutrition

## 2022-01-05 ENCOUNTER — Other Ambulatory Visit: Admit: 2022-01-05 | Payer: MEDICARE | Primary: Family Medicine

## 2022-01-05 DIAGNOSIS — D509 Iron deficiency anemia, unspecified: Secondary | ICD-10-CM

## 2022-01-05 LAB — CBC WITH DIFFERENTIAL
Basophils %: 0.4 %
Basophils Absolute: 0.03 10*3/uL (ref 0.00–0.22)
Eosinophils %: 3.1 %
Eosinophils Absolute: 0.22 10*3/uL (ref 0.00–0.50)
Hematocrit: 39.3 % (ref 32.0–47.0)
Hemoglobin: 12 g/dL (ref 11.0–16.0)
Immature Granulocytes %: 0.3 %
Immature Granulocytes Absolute: 0.02 10*3/uL (ref 0.00–0.10)
Lymphocyte %: 24.3 %
Lymphocytes Absolute: 1.73 10*3/uL (ref 0.70–4.00)
MCH: 27.1 pg (ref 26.0–34.0)
MCHC: 30.5 g/dL — ABNORMAL LOW (ref 31.0–37.0)
MCV: 88.9 fL (ref 80.0–100.0)
MPV: 12.3 fL (ref 9.1–12.4)
Monocytes %: 9.1 %
Monocytes Absolute: 0.65 10*3/uL (ref 0.36–0.77)
NRBC %: 0 % (ref 0.0–0.0)
NRBC Absolute: 0 10*3/uL (ref 0.00–2.00)
Neutrophil %: 62.8 %
Neutrophils Absolute: 4.46 10*3/uL (ref 1.50–7.95)
Platelets: 329 10*3/uL (ref 150–400)
RBC: 4.42 M/uL (ref 3.70–5.20)
RDW-CV: 15.9 % — ABNORMAL HIGH (ref 11.5–14.5)
RDW-SD: 51.8 fL — ABNORMAL HIGH (ref 35.0–51.0)
WBC: 7.1 10*3/uL (ref 4.0–11.0)

## 2022-01-05 LAB — IRON, FERRITIN, TIBC
Ferritin: 8.7 ng/mL — ABNORMAL LOW (ref 10.0–307.0)
Iron Saturation: 10 % (ref 10–50)
Iron: 33 ug/dL (ref 30–180)
TIBC: 338 ug/dL (ref 250–463)

## 2022-01-05 LAB — TSH: TSH: 11.2 u[IU]/mL — ABNORMAL HIGH (ref 0.358–3.740)

## 2022-01-08 MED ORDER — levothyroxine (Synthroid, Levoxyl) 50 mcg tablet
50 | ORAL_TABLET | Freq: Every day | ORAL | 0 refills | 90.00000 days | Status: DC
Start: 2022-01-08 — End: 2022-03-25

## 2022-03-24 MED ORDER — pregabalin (Lyrica) 50 mg capsule
50 | ORAL_CAPSULE | Freq: Every day | ORAL | 1 refills | Status: AC
Start: 2022-03-24 — End: 2023-03-24

## 2022-03-25 MED ORDER — levothyroxine (Synthroid, Levoxyl) 75 mcg tablet
75 | ORAL_TABLET | Freq: Every day | ORAL | 0 refills | 90.00000 days | Status: DC
Start: 2022-03-25 — End: 2022-03-25

## 2022-03-25 MED ORDER — levothyroxine (Synthroid, Levoxyl) 75 mcg tablet
75 | ORAL_TABLET | Freq: Every day | ORAL | 0 refills | 90.00000 days | Status: AC
Start: 2022-03-25 — End: 2023-03-25

## 2022-04-06 MED ORDER — amitriptyline (Elavil) 50 mg tablet
50 | ORAL_TABLET | Freq: Every evening | ORAL | 1 refills | Status: AC
Start: 2022-04-06 — End: 2023-04-06

## 2022-04-06 NOTE — Telephone Encounter (Signed)
Last OV 12/31/21

## 2022-04-27 MED ORDER — mirtazapine (Remeron) 15 mg tablet
15 | ORAL_TABLET | ORAL | 1 refills | 28.00000 days | Status: AC
Start: 2022-04-27 — End: ?

## 2022-04-28 NOTE — Telephone Encounter (Signed)
From: Seward Carol  To: Dr. Cheryle Horsfall  Sent: 04/27/2022 6:32 PM EDT  Subject: Iron blood check    Hi Dr Gwendel Hanson, I have been having some dizziness the last couple of days and wonder if I should have my iron checked. I am taking it every day, so I don't know why it wouldn't be good but I get worried when I have dizziness.  I can go to [facility] if you think you should put an order in.  Thank you,  Chase Caller

## 2022-04-30 LAB — CBC W/DIFF
Baso Abs: 0 10*3/uL (ref 0.0–0.2)
Basos: 0 %
Eos Abs: 0.1 10*3/uL (ref 0.0–0.4)
Eos: 3 %
Hct: 33.7 % — ABNORMAL LOW (ref 34.0–46.6)
Hgb: 10.7 g/dL — ABNORMAL LOW (ref 11.1–15.9)
Immature Grans Abs: 0 10*3/uL (ref 0.0–0.1)
Immature Granulocytes: 0 %
Lymphs Abs: 1.4 10*3/uL (ref 0.7–3.1)
Lymphs: 27 %
MCH: 28.1 pg (ref 26.6–33.0)
MCHC: 31.8 g/dL (ref 31.5–35.7)
MCV: 89 fL (ref 79–97)
Monocytes: 12 %
MonocytesAbs: 0.6 10*3/uL (ref 0.1–0.9)
Neutrophils Abs: 2.9 10*3/uL (ref 1.4–7.0)
Neutrophils: 58 %
Platelets: 260 10*3/uL (ref 150–450)
RBC: 3.81 x10E6/uL (ref 3.77–5.28)
RDW: 14.2 % (ref 11.7–15.4)
WBC: 5.1 10*3/uL (ref 3.4–10.8)

## 2022-04-30 LAB — BASIC METABOLIC PANEL
BUN/Creat Ratio: 24 (ref 12–28)
BUN: 16 mg/dL (ref 8–27)
Calcium: 7.9 mg/dL — ABNORMAL LOW (ref 8.7–10.3)
Carbon Dioxide: 24 mmol/L (ref 20–29)
Chloride: 107 mmol/L — ABNORMAL HIGH (ref 96–106)
Creat: 0.66 mg/dL (ref 0.57–1.00)
Glucose: 119 mg/dL — ABNORMAL HIGH (ref 70–99)
Potassium: 4.3 mmol/L (ref 3.5–5.2)
Sodium: 143 mmol/L (ref 134–144)
eGFR: 86 mL/min/{1.73_m2} (ref >59)

## 2022-04-30 LAB — IRON, FERRITIN, TIBC
Ferritin: 20 ng/mL (ref 15–150)
Iron Bind.Cap.(TIBC): 308 ug/dL (ref 250–450)
Iron Saturation: 17 % (ref 15–55)
Iron: 51 ug/dL (ref 27–139)
UIBC: 257 ug/dL (ref 118–369)

## 2022-04-30 LAB — TSH: TSH: 6.47 u[IU]/mL — ABNORMAL HIGH (ref 0.450–4.500)

## 2022-07-06 ENCOUNTER — Encounter: Payer: MEDICARE | Attending: Family Medicine | Primary: Family Medicine

## 2022-08-05 ENCOUNTER — Ambulatory Visit: Admit: 2022-08-05 | Discharge: 2022-08-05 | Payer: MEDICARE | Attending: Family Medicine | Primary: Family Medicine

## 2022-08-05 DIAGNOSIS — D509 Iron deficiency anemia, unspecified: Secondary | ICD-10-CM

## 2022-08-05 MED ORDER — mirtazapine (Remeron) 15 mg tablet
15 | ORAL_TABLET | ORAL | 1 refills | 28.00000 days | Status: AC
Start: 2022-08-05 — End: ?

## 2022-08-05 NOTE — Progress Notes (Signed)
[facility]  [address]  [place] [address]  Dept: [phone]  Dept Fax: [phone]     Patient ID: [name] is a 88 y.o. female who presents for Follow-up (Feeling ok   nose runs all the time--get nauseous).    Subjective   HPI   Here in follow up.     Working at [employer].     On a waiting list for elderly housing.    Weight is stable.   Swallowing  Is better.    Bowels  With urgency.    Anemia better when last checked.     Some irritability.         Current Outpatient Medications   Medication Instructions   . amitriptyline (ELAVIL) 50 mg, oral, Nightly   . levothyroxine (SYNTHROID, LEVOXYL) 75 mcg, oral, Daily   . mirtazapine (Remeron) 15 mg tablet TAKE 1 TABLET BY MOUTH EVERYDAY AT BEDTIME   . multivitamin tablet 1 tablet, oral, Daily   . pantoprazole (ProtoNix) 40 mg EC tablet TAKE 1 TABLET BY MOUTH TWICE A DAY   . pregabalin (LYRICA) 50 mg, oral, Daily   all:  None  Soc:   No smoking.  No alcohol.   Nutrition better    Review of Systems   Constitutional: Negative.    HENT: Negative.    Eyes: Negative.    Respiratory: Negative.    Cardiovascular: Negative.    Gastrointestinal: Positive for diarrhea.   Genitourinary: Negative.    Musculoskeletal: Positive for joint pain.   Neurological: Negative.    Psychiatric/Behavioral: The patient has insomnia.        Objective   Visit Vitals  BP 122/72   Temp 37.1 C (98.7 F)   Wt 45.4 kg   BMI 17.71 kg/m   OB Status Postmenopausal   BSA 1.42 m       Physical Exam  BP 122/72   Temp 37.1 C (98.7 F)   Wt 45.4 kg   BMI 17.71 kg/m     General Appearance:    Alert, cooperative, no distress, appears stated age   Head:    Normocephalic, without obvious abnormality, atraumatic   Eyes:    PERRL, conjunctiva/corneas clear, EOM's intact, fundi     benign, both eyes   Ears:    Normal TM's and external ear canals, both ears   Nose:   Nares normal, septum midline, mucosa normal, no drainage     or sinus tenderness    Throat:   Lips, mucosa, and tongue normal; teeth and gums normal   Neck:   Supple, symmetrical, trachea midline, no adenopathy;     thyroid:  no enlargement/tenderness/nodules; no carotid    bruit or JVD   Back:     Symmetric, no curvature, ROM normal, no CVA tenderness   Lungs:     Clear to auscultation bilaterally, respirations unlabored   Chest Wall:    No tenderness or deformity    Heart:    Regular rate and rhythm, S1 and S2 normal, no murmur, rub    or gallop   Breast Exam:       Abdomen:     Soft, non-tender, bowel sounds active all four quadrants,     no masses, no organomegaly   Genitalia:     Rectal:     Extremities:   Extremities normal, atraumatic, no cyanosis or edema.   Arthritis of hands.     Pulses:  2+ and symmetric all extremities   Skin:   Skin color, texture, turgor normal, no rashes or lesions   Lymph nodes:   Cervical, supraclavicular, and axillary nodes normal   Neurologic:   CNII-XII intact, normal strength, sensation and reflexes     throughout     Assessment/Plan   [name] was seen today for follow-up.  Iron deficiency anemia, unspecified iron deficiency anemia type  Comments:  recheck labs.   on oral iron.    Orders:  -     CBC and differential - WAM and non-WAM; Future  -     IRON, FERRITIN, AND TIBC; Future  Acquired hypothyroidism  Comments:  recheck and adjust med prn  Orders:  -     TSH; Future  Gastric outlet obstruction (HHS-HCC)  Comments:  better this year.     Orders:  -     Basic metabolic panel; Future  Neuropathy  Comments:  on lyrica   Psychophysiological insomnia  Comments:  on amitryptilline     Peptic ulcer disease  Comments:  multiple surgeries for this in the past.   on PPI  Depression with anxiety  Comments:  on mirtazipine.  considering stopping this

## 2022-08-06 LAB — CBC W/DIFF
Baso Abs: 0 10*3/uL (ref 0.0–0.2)
Basos: 0 %
Eos Abs: 0.3 10*3/uL (ref 0.0–0.4)
Eos: 4 %
Hct: 38.5 % (ref 34.0–46.6)
Hgb: 12.4 g/dL (ref 11.1–15.9)
Immature Grans Abs: 0 10*3/uL (ref 0.0–0.1)
Immature Granulocytes: 0 %
Lymphs Abs: 1.5 10*3/uL (ref 0.7–3.1)
Lymphs: 22 %
MCH: 29.1 pg (ref 26.6–33.0)
MCHC: 32.2 g/dL (ref 31.5–35.7)
MCV: 90 fL (ref 79–97)
Monocytes: 11 %
MonocytesAbs: 0.8 10*3/uL (ref 0.1–0.9)
Neutrophils Abs: 4.4 10*3/uL (ref 1.4–7.0)
Neutrophils: 63 %
Platelets: 257 10*3/uL (ref 150–450)
RBC: 4.26 x10E6/uL (ref 3.77–5.28)
RDW: 14.3 % (ref 11.7–15.4)
WBC: 7.1 10*3/uL (ref 3.4–10.8)

## 2022-08-06 LAB — IRON, FERRITIN, TIBC
Ferritin: 16 ng/mL (ref 15–150)
Iron Bind.Cap.(TIBC): 385 ug/dL (ref 250–450)
Iron Saturation: 9 % — ABNORMAL LOW (ref 15–55)
Iron: 36 ug/dL (ref 27–139)
UIBC: 349 ug/dL (ref 118–369)

## 2022-08-06 LAB — BASIC METABOLIC PANEL
Anion Gap: 11 mmol/L (ref 10.0–18.0)
BUN/Creat Ratio: 44 — ABNORMAL HIGH (ref 12–28)
BUN: 24 mg/dL (ref 8–27)
Calcium: 8.4 mg/dL — ABNORMAL LOW (ref 8.7–10.3)
Carbon Dioxide: 23 mmol/L (ref 20–29)
Chloride: 106 mmol/L (ref 96–106)
Creat: 0.55 mg/dL — ABNORMAL LOW (ref 0.57–1.00)
Glucose: 92 mg/dL (ref 70–99)
Potassium: 4.7 mmol/L (ref 3.5–5.2)
Sodium: 140 mmol/L (ref 134–144)
eGFR: 90 mL/min/{1.73_m2} (ref >59)

## 2022-08-06 LAB — TSH: TSH: 8.98 u[IU]/mL — ABNORMAL HIGH (ref 0.450–4.500)

## 2022-08-07 MED ORDER — levothyroxine (Synthroid, Levoxyl) 88 mcg tablet
88 | ORAL_TABLET | Freq: Every day | ORAL | 0 refills | 90.00000 days | Status: DC
Start: 2022-08-07 — End: 2022-10-02

## 2022-09-10 MED ORDER — scopolamine (Transderm-Scop) 1 mg over 3 days patch 3 day
1 | MEDICATED_PATCH | TRANSDERMAL | 0 refills | 12.00000 days | Status: AC | PRN
Start: 2022-09-10 — End: ?

## 2022-09-22 MED ORDER — pregabalin (Lyrica) 50 mg capsule
50 | ORAL_CAPSULE | Freq: Every day | ORAL | 1 refills | Status: AC
Start: 2022-09-22 — End: 2023-09-22

## 2022-10-02 MED ORDER — levothyroxine (Synthroid, Levoxyl) 100 mcg tablet
100 | ORAL_TABLET | Freq: Every day | ORAL | 0 refills | 90.00000 days | Status: DC
Start: 2022-10-02 — End: 2022-12-29

## 2022-10-02 NOTE — Telephone Encounter (Signed)
levothyroxine (Synthroid, Levoxyl) 88 mcg tablet       Last CPE:  Pt needs an cpe   Pt has an upcoming appt with [facility] 02/03/23   Last OV: 08/05/22 with PH   Last Filled: 08/07/22 qty 30

## 2022-10-09 IMAGING — DX DG ABDOMEN ACUTE W/ 1V CHEST
3 series · 3 of 3 positions shown · non-contrast
Comparison: 08/30/2017.

CLINICAL DATA: Vertigo.  Abdominal pain.  Weight loss.

EXAM:
DG ABDOMEN ACUTE WITH 1 VIEW CHEST

[dg abd acute 2+v w 1v chest (1 of 3)]
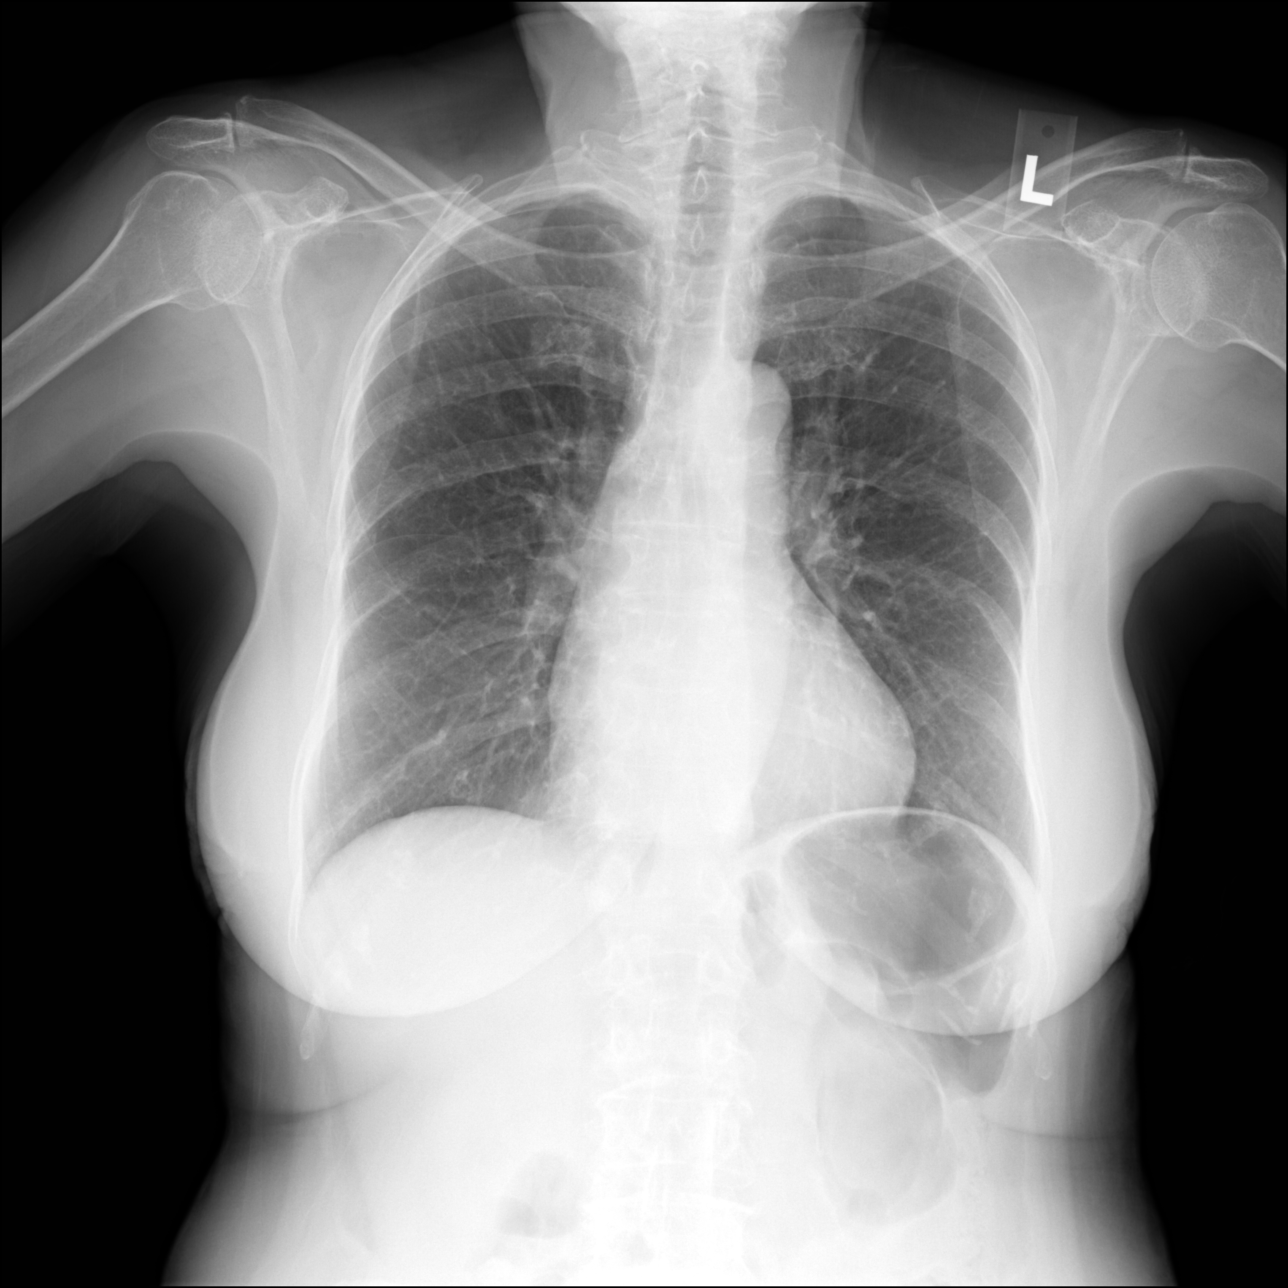

[dg abd acute 2+v w 1v chest (2 of 3)]
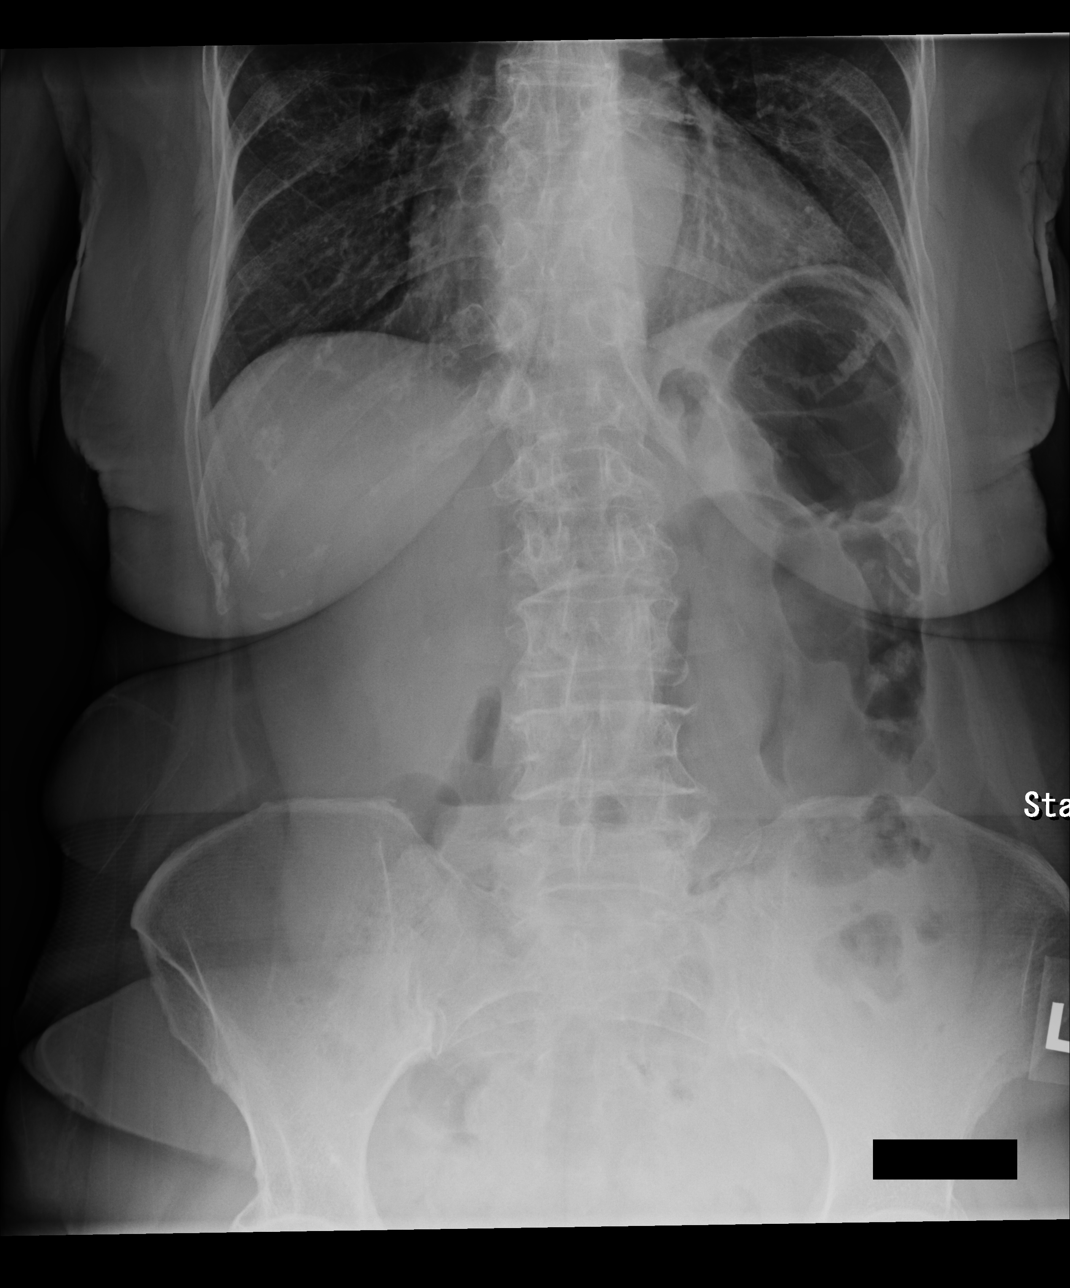

[dg abd acute 2+v w 1v chest (3 of 3)]
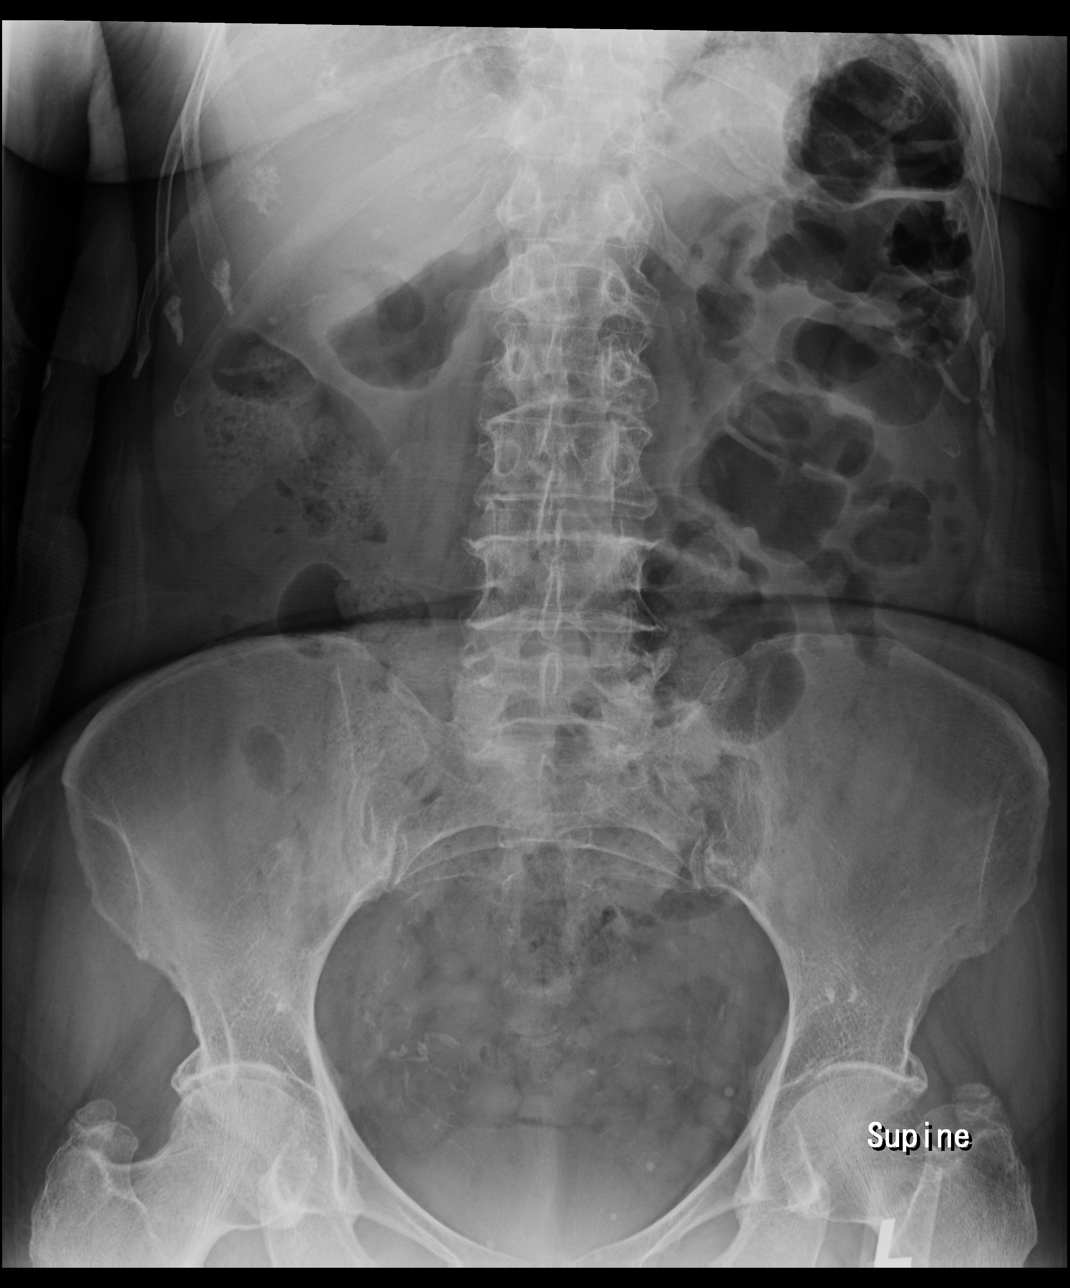

[3 of 3 positions shown; findings below may reference images not displayed]

FINDINGS: No acute cardiopulmonary disease. No bowel distention. Stool noted
throughout the colon. Pelvic calcifications consistent phleboliths.
Aortoiliac atherosclerotic vascular calcification. Degenerative
change lumbar spine and both hips. Stable mild lumbar compression
fractures most likely present.
IMPRESSION: 1. No acute cardiopulmonary disease.
2. No acute intra-abdominal abnormality identified. Stool noted
throughout the colon.

## 2022-10-24 MED ORDER — amitriptyline (Elavil) 50 mg tablet
50 | ORAL_TABLET | Freq: Every evening | ORAL | 1 refills | Status: AC
Start: 2022-10-24 — End: 2023-10-24

## 2022-12-12 ENCOUNTER — Emergency Department: Admit: 2022-12-12 | Payer: MEDICARE | Primary: Family Medicine

## 2022-12-12 ENCOUNTER — Inpatient Hospital Stay
Admit: 2022-12-12 | Discharge: 2022-12-12 | Disposition: A | Discharge status: Home / Self Care | Payer: MEDICARE | Attending: Emergency Medicine

## 2022-12-12 DIAGNOSIS — R413 Other amnesia: Secondary | ICD-10-CM

## 2022-12-12 DIAGNOSIS — R112 Nausea with vomiting, unspecified: Secondary | ICD-10-CM

## 2022-12-12 LAB — URINALYSIS REFLEX TO CULTURE
Bacteria, Ur: NONE SEEN
Bilirubin, Ur: NEGATIVE
Casts, Ur: 7 /LPF — ABNORMAL HIGH (ref 0–4)
Epithelial Cells, UR: 1 {cells}/[HPF] (ref 0–5)
Glucose,Ur: NEGATIVE mg/dL
Ketones, Ur: NEGATIVE mg/dL
Nitrite, Ur: NEGATIVE
RBC, Ur: 10 {cells}/[HPF] — ABNORMAL HIGH (ref 0–4)
Specific Gravity, Ur: 1.02 (ref 1.005–1.030)
Urobilinogen, Ur: 0.2 U/dL (ref 0.2–1.0)
WBC, Ur: 6 {cells}/[HPF] — ABNORMAL HIGH (ref 0–5)
pH, Ur: 5.5 (ref 5.0–8.0)

## 2022-12-12 LAB — CBC WITH DIFFERENTIAL
Basophils %: 0.3 %
Basophils Absolute: 0.03 10*3/uL (ref 0.00–0.22)
Eosinophils %: 2.2 %
Eosinophils Absolute: 0.19 10*3/uL (ref 0.00–0.50)
Hematocrit: 38 % (ref 32.0–47.0)
Hemoglobin: 12 g/dL (ref 11.0–16.0)
Immature Granulocytes %: 0.2 %
Immature Granulocytes Absolute: 0.02 10*3/uL (ref 0.00–0.10)
Lymphocyte %: 16.3 %
Lymphocytes Absolute: 1.42 10*3/uL (ref 0.70–4.00)
MCH: 29.3 pg (ref 26.0–34.0)
MCHC: 31.6 g/dL (ref 31.0–37.0)
MCV: 92.7 fL (ref 80.0–100.0)
MPV: 11.7 fL (ref 9.1–12.4)
Monocytes %: 7.9 %
Monocytes Absolute: 0.69 10*3/uL (ref 0.36–0.77)
NRBC %: 0 % (ref 0.0–0.0)
NRBC Absolute: 0 10*3/uL (ref 0.00–2.00)
Neutrophil %: 73.1 %
Neutrophils Absolute: 6.38 10*3/uL (ref 1.50–7.95)
Platelets: 248 10*3/uL (ref 150–400)
RBC: 4.1 M/uL (ref 3.70–5.20)
RDW-CV: 13.8 % (ref 11.5–14.5)
RDW-SD: 46.8 fL (ref 35.0–51.0)
WBC: 8.7 10*3/uL (ref 4.0–11.0)

## 2022-12-12 LAB — COMPREHENSIVE METABOLIC PANEL
ALT: 28 U/L (ref 0–55)
AST: 22 U/L (ref 6–42)
Albumin: 2.9 g/dL — ABNORMAL LOW (ref 3.2–5.0)
Alkaline phosphatase: 197 U/L — ABNORMAL HIGH (ref 30–130)
Anion Gap: 6 mmol/L (ref 3–14)
BUN: 20 mg/dL (ref 6–24)
Bilirubin, total: 0.1 mg/dL — ABNORMAL LOW (ref 0.2–1.2)
CO2 (Bicarbonate): 25 mmol/L (ref 20–32)
Calcium: 8.2 mg/dL — ABNORMAL LOW (ref 8.5–10.5)
Chloride: 110 mmol/L (ref 98–110)
Creatinine: 0.61 mg/dL (ref 0.55–1.30)
Glucose: 115 mg/dL — ABNORMAL HIGH (ref 70–110)
Potassium: 4 mmol/L (ref 3.6–5.2)
Protein, total: 5.8 g/dL — ABNORMAL LOW (ref 6.0–8.4)
Sodium: 141 mmol/L (ref 135–146)
eGFRcr: 87 mL/min/{1.73_m2} (ref >=60)

## 2022-12-12 LAB — RAINBOW DRAW SST GOLD TOP

## 2022-12-12 LAB — LIGHT BLUE TOP

## 2022-12-12 LAB — TROPONIN I, HIGH SENSITIVITY: Troponin I, High Sensitivity: <6 ng/L

## 2022-12-12 NOTE — Other (Signed)
Patient Education  Table of Contents   Confusion   Nausea, Adult    To view videos and all your education online visit,  https://pe.elsevier.com/UHhnlvEl  or scan this QR code with your smartphone.  Access to this content will expire in one year.  Confusion  Confusion is the inability to think with your usual speed or clarity. Confusion can be caused by many things. People who are confused often describe their thinking as cloudy or unclear. Confusion can also include feeling disoriented. This means you are unaware of where you are or who you are. You may also not know the date or time. When confused, you may have trouble remembering, paying attention, or making decisions. Some people also act aggressively when they are confused.  In some cases, confusion may come on quickly. In other cases, it may develop slowly over time.  Confusion may be caused by medical conditions such as:   Infections, such as a urinary tract infection (UTI).   Low levels of oxygen, which can develop from conditions such as long-term lung disorders.   Decrease in brain function due to dementia and other conditions that affect the brain, such as seizures, strokes, brain tumors, or head injuries.   Mental health conditions, like panic attacks, anxiety, depression, and hallucinations.  Confusion may also be caused by physical factors such as:   Loss of fluid (dehydration) or an imbalance of salts and minerals in the body (electrolytes).   Lack of certain nutrients like niacin, thiamine, or other B vitamins.   Fever or hypothermia, which is a sudden drop in body temperature.   Low or high blood sugar.   Low or high blood pressure.  Other causes include:   Lack of sleep or changes in routine or surroundings, such as when traveling or staying in a hospital.   Using too much alcohol, drugs, or medicine.   Side effects of medicines, or taking medicines that affect other medicines (drug interactions).  Follow these instructions at home:  Pay attention  to your symptoms. Tell your health care provider about any changes or if you develop new symptoms. Follow these instructions to control or treat symptoms. Ask a family member or friend for help if needed.  Medicines     Take over-the-counter and prescription medicines only as told by your health care provider.   Ask your health care provider about changing or stopping any medicines that may be causing your confusion.   Avoid pain medicines or sleep medicines until you have fully recovered.   Use a pillbox or an alarm to help you take the right medicines at the right time.  Lifestyle     Eat a balanced diet that includes fruits and vegetables.   Get enough sleep. For most adults, this is 7?9 hours each night.   Do not drink alcohol.   Do not become isolated. Spend time with other people and make plans for your days.   Do not drive until your health care provider says that it is safe to do so.   Do not use any products that contain nicotine or tobacco, such as cigarettes, e-cigarettes, and chewing tobacco. If you need help quitting, ask your health care provider.   Stop other activities that may increase your chances of getting hurt. These may include some work duties, sports activities, swimming, or bike riding. Ask your health care provider what activities are safe for you.  Tips for caregivers   Find out if the person is confused. Ask  the person to state his or her name, age, and the date. If the person is unsure or answers incorrectly, he or she may be confused and need assistance.   Always introduce yourself, no matter how well the person knows you. Remind the person of his or her location.   Place a calendar and clock near the person who is confused. Keep a regular schedule. Make sure the person has plenty of light during the day and sleep at night.   Talk about current events and plans for the day.   Keep the environment calm, quiet, and peaceful.   Help the person do the things that he or she is unable to do.  These include:  ? Taking medicines.   ? Keeping medical appointments.  ? Helping with household duties, including meal preparation.  ? Running errands.   Get help if you need it. There are several support groups for caregivers. If the person you are helping needs more support, consider day care, extended-care programs, or a skilled nursing facility. The person's health care provider may be able to help evaluate these options.  General instructions   Monitor yourself for any conditions you may have. These can include:  ? Checking your blood glucose levels if you have diabetes.  ? Maintaining a healthy weight.  ? Monitoring your blood pressure if you have hypertension.  ? Monitoring your body temperature if you have a fever.   Keep all follow-up visits. This is important.  Contact a health care provider if:   You have new symptoms or your symptoms get worse.  Get help right away if you:   Feel that you are not able to care for yourself.   Develop severe headaches, repeated vomiting, seizures, blackouts, or slurred speech.   Have increasing confusion, weakness, numbness, restlessness, or personality changes.   Develop a loss of balance, have marked dizziness, feel uncoordinated, or fall.   Develop severe anxiety, or you have delusions or hallucinations.  These symptoms may represent a serious problem that is an emergency. Do not wait to see if the symptoms will go away. Get medical help right away. Call your local emergency services (911 in the [place] Do not drive yourself to the hospital.  Summary   Confusion is the inability to think with your usual speed or clarity. People who are confused often describe their thinking as cloudy or unclear.   Confusion can also include having trouble remembering, paying attention, or making decisions.   Confusion may come on quickly or develop slowly over time, depending on the cause. There are many different causes of confusion.   Ask for help from family members or friends if you  are unable to take care of yourself.  This information is not intended to replace advice given to you by your health care provider. Make sure you discuss any questions you have with your health care provider.  Document Released: 2004-08-13 Document Updated: 2019-10-31 Document Reviewed: 2019-10-31  Elsevier Patient Education ? 2024 Else[employer].  Nausea, Adult  Nausea is the feeling of having an upset stomach or that you are about to vomit. Nausea on its own is not usually a serious concern, but it may be an early sign of a more serious medical problem. As nausea gets worse, it can lead to vomiting. If vomiting develops, or if you are not able to drink enough fluids, you are at risk of becoming dehydrated.  Dehydration can make you tired and thirsty, cause you to have  a dry mouth, and decrease how often you urinate. Older adults and people with other diseases or a weak disease-fighting system (immune system) are at higher risk for dehydration. The main goals of treating your nausea are:   To relieve your nausea.   To limit repeated nausea episodes.   To prevent vomiting and dehydration.  Follow these instructions at home:  Watch your symptoms for any changes. Tell your health care provider about them.  Eating and drinking       Take an oral rehydration solution (ORS). This is a drink that is sold at pharmacies and retail stores.   Drink clear fluids slowly and in small amounts as you are able. Clear fluids include water, ice chips, low-calorie sports drinks, and fruit juice that has water added (diluted fruit juice).   Eat bland, easy-to-digest foods in small amounts as you are able. These foods include bananas, applesauce, rice, lean meats, toast, and crackers.   Avoid drinking fluids that contain a lot of sugar or caffeine, such as energy drinks, sports drinks, and soda.   Avoid alcohol.   Avoid spicy or fatty foods.  General instructions   Take over-the-counter and prescription medicines only as told by your  health care provider.   Rest at home while you recover.   Drink enough fluid to keep your urine pale yellow.   Breathe slowly and deeply when you feel nauseous.   Avoid smelling things that have strong odors.   Wash your hands often using soap and water for at least 20 seconds. If soap and water are not available, use hand sanitizer.   Make sure that everyone in your household washes their hands well and often.   Keep all follow-up visits. This is important.  Contact a health care provider if:   Your nausea gets worse.   Your nausea does not go away after two days.   You vomit multiple times.   You cannot drink fluids without vomiting.   You have any of the following:  ? New symptoms.  ? A fever.  ? A headache.  ? Muscle cramps.  ? A rash.  ? Pain while urinating.   You feel light-headed or dizzy.  Get help right away if:   You have pain in your chest, neck, arm, or jaw.   You feel extremely weak or you faint.   You have vomit that is bright red or looks like coffee grounds.   You have bloody or black stools (feces) or stools that look like tar.   You have a severe headache, a stiff neck, or both.   You have severe pain, cramping, or bloating in your abdomen.   You have difficulty breathing or are breathing very quickly.   Your heart is beating very quickly.   Your skin feels cold and clammy.   You feel confused.   You have signs of dehydration, such as:  ? Dark urine, very little urine, or no urine.  ? Cracked lips.  ? Dry mouth.  ? Sunken eyes.  ? Sleepiness.  ? Weakness.  These symptoms may be an emergency. Get help right away. Call 911.    Do not wait to see if the symptoms will go away.   Do not drive yourself to the hospital.  Summary   Nausea is the feeling that you have an upset stomach or that you are about to vomit. Nausea on its own is not usually a serious concern, but it may be an early  sign of a more serious medical problem.   If vomiting develops, or if you are not able to drink enough fluids, you are  at risk of becoming dehydrated.   Follow recommendations for eating and drinking and take over-the-counter and prescription medicines only as told by your health care provider.   Contact a health care provider right away if your symptoms worsen or you have new symptoms.   Keep all follow-up visits. This is important.  This information is not intended to replace advice given to you by your health care provider. Make sure you discuss any questions you have with your health care provider.  Document Released: 2004-08-13 Document Updated: 2021-01-10 Document Reviewed: 2021-01-10  Elsevier Patient Education ? 2024 Elsevier Inc.

## 2022-12-12 NOTE — ED Triage Notes (Signed)
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

## 2022-12-12 NOTE — Telephone Encounter (Signed)
Called and spoke with pt. Advised pt that she needs to go over to the ED advised by [facility]     Pt states she has been vomiting for 2 weeks, having dizziness/lighheadness , headaches that comes and goes that is sharp. Pt states she had an episode at work where she "blacked out" for about 5 mins and had loss of time and where she was . Pt denies hitting her head, chest pain, sob,lack of mobility, slurred speech, jaw pain or  shoulder pain. Advised pt she needs to go to ER offered to call EMS for her as she is having dizziness/lightheadness, pt states she has someone to drive her and will got [employer]

## 2022-12-12 NOTE — Telephone Encounter (Signed)
Patient had an episode at work  She blacked out did not remember what  Happened and been vomiting every day just about

## 2022-12-12 NOTE — Discharge Instructions (Signed)
Please followup with your Dr. this week for reevaluation of your symptoms. Call or return sooner if symptoms significantly change or worsen.

## 2022-12-12 NOTE — ED Provider Notes (Signed)
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  Patient did not fall to the ground had no associated headache or dizziness, patient reports she just simply does not remember that customer pain or leaving.  Patient otherwise awake alert oriented x 4 has not had any recurrent symptoms, patient was going to call her PCP just to let them know and schedule an outpatient appointment but they thought she should be seen in the emergency department today, patient has not had any symptoms today, denies any associated chest pain or abdominal pain denies any nausea or vomiting currently is awake alert oriented x 4.  Lives with her daughter and family.  Denies any recent fever or illness.            Glasgow Coma Scale Score: 15                                  Patient History   Past Medical History:   Diagnosis Date    Colon cancer (CMS-HCC) (HHS-HCC)     Depression     Duodenal ulcer     GERD (gastroesophageal reflux disease)     Hx of carcinoid syndrome     Hypothyroid     Insomnia     Iron deficiency anemia     Neuropathy     Peptic ulcer disease     Skin cancer, basal cell      Past Surgical History:   Procedure Laterality Date    APPENDECTOMY      COLONOSCOPY      ESOPHAGOGASTRODUODENOSCOPY  11/24/2019    ESOPHAGOGASTRODUODENOSCOPY  04/05/2020    EXPLORATORY LAPAROTOMY  03/12/2020    with partial gastrectomy and redo of Billroth II    EXPLORATORY LAPAROTOMY  01/2019     with Cheree Ditto patch repair of duodenal ulcer and appendectomy    LYSIS OF ADHESIONS  02/20/2020    with Billroth II    RIGHT COLECTOMY  01/2019     No family history on file.  Social History     Tobacco Use    Smoking status: Never    Smokeless tobacco: Never   Vaping Use    Vaping Use: Never used   Substance Use Topics    Alcohol use: Not Currently    Drug use: Never       Review of Systems   Review of Systems    Physical Exam   ED Triage Vitals   Temp Pulse Resp BP   12/12/22 1438 12/12/22 1438 12/12/22 1438 12/12/22 1438   36.8 C (98.2 F) 100 18 136/73      SpO2 Temp src Heart Rate Source Patient Position   12/12/22 1438 -- 12/12/22 1615 12/12/22 1615   98 %  Monitor Lying      BP Location FiO2 (%)  12/12/22 1615 --     Left arm        Physical Exam  Constitutional: Patient is oriented to person, place, and time. Patient appears well-developed and well-nourished.  Not ill or toxic appearing  Head: Normocephalic and atraumatic.  Right Ear: External ear normal.  Left Ear: External ear normal.  Nose: Nose normal.  Eyes: EOM are normal. Pupils are equal, round, and reactive to light.  Neck: Normal range of motion. Neck supple. No JVD present.  Cardiovascular: Normal rate, regular rhythm and normal heart sounds. ?  Pulmonary/Chest: Effort normal and breath sounds normal. No stridor. No respiratory distress. Patient has no wheezes. Patient has no rales. Patient exhibits no tenderness.  Abdominal: Soft. Patient exhibits no distension. There is no tenderness. There is no rebound and no guarding.  Musculoskeletal: Normal range of motion. Patient exhibits no edema, tenderness or deformity.  Neurological: Patient is alert and oriented to person, place, and time. No cranial nerve deficits. Alert and oriented to person, place, time, and situation, PERRL, EOMI, Symmetric smile and forehead.  No tongue deviation.  [place]a midline.  5/5 strength in the upper and lower extremities bilaterally. Sensation intact BUE/BLE. Negative  pronator drift. Intact finger/nose bilaterally. Intact heel/shin bilaterally. Toes downgoing bilaterally. Normal Gait  Skin: Skin is warm and dry. No rash noted. No diaphoresis. No erythema.  Psychiatric: Normal mood and affect    CT HEAD WO CONTRAST   Final Result   No CT evidence of an acute intracranial process.      Iver Nestle, DO 12/12/2022 4:47 PM          Labs Reviewed   COMPREHENSIVE METABOLIC PANEL - Abnormal       Result Value    Sodium 141      Potassium 4.0      Chloride 110      CO2 (Bicarbonate) 25      Anion Gap 6      BUN 20      Creatinine 0.61      eGFRcr 87      Glucose 115 (*)     Fasting? Unknown      Calcium 8.2 (*)     AST 22      ALT 28      Alkaline phosphatase 197 (*)     Protein, total 5.8 (*)     Albumin 2.9 (*)     Bilirubin, total 0.1 (*)    URINALYSIS REFLEX TO CULTURE - Abnormal    Color, Ur Yellow      Clarity, Ur Turbid (*)     Specific Gravity, Ur 1.020      pH, Ur 5.5      Protein,Ur Trace (*)     Glucose,Ur Negative      Ketones, Ur Negative      Bilirubin, Ur Negative      Blood, Ur Trace (*)     Urobilinogen, Ur 0.2      Nitrite, Ur Negative      Leukocyte Esterase, Ur Trace (*)     RBC, Ur 10 (*)     WBC, Ur 6 (*)     Epithelial Cells, UR 1      Bacteria, Ur None Seen      Casts, Ur 7 (*)     Calcium Oxalate Crystal, Ur Present (*)    TROPONIN I, HIGH SENSITIVITY - Normal    Troponin I, High Sensitivity <6      Narrative:  Clinical correlation, HEART Score and shared decision making must be taken into account.                   For Chest Pain >3 Hours    Rule-Out Criteria              Single Value     0hr/1hr Delta Value  Female  <10ng/L*          <54ng/L AND delta <15ng/L  Female    <10ng/L*          <79ng/L AND delta <15ng/L    Cannot Rule-Out                            0hr/1hr Delta Value  Female    <54ng/L AND delta >15ng/L    >/= 54 AND delta </=15ng/L  Female      <79ng/L AND delta >15ng/L    >/= 79 AND delta </=15ng/L    Rule-In Criteria               Single Value    0hr/1hr Delta Value                 Female   >115ng/L       >/=54ng/L AND delta >/=15ng/L         Female     >115ng/L       >/=79ng/L AND delta >/=15ng/L           *Note: for Chest pain <3 hours 0hr/1hr is warranted for evaluation.     CBC W/DIFF    Narrative:     The following orders were created for panel order CBC and differential.  Procedure                               Abnormality         Status                     ---------                               -----------         ------                     CBC w/ Differential[[id number]]                              Final result                 Please view results for these tests on the individual orders.   CBC WITH DIFFERENTIAL    WBC 8.7      RBC 4.10      Hemoglobin 12.0      Hematocrit 38.0      MCV 92.7      MCH 29.3      MCHC 31.6      RDW-CV 13.8      RDW-SD 46.8      Platelets 248      MPV 11.7      Neutrophil % 73.1      Lymphocyte % 16.3      Monocytes % 7.9      Eosinophils % 2.2      Basophils % 0.3  Immature Granulocytes % 0.2      NRBC % 0.0      Neutrophils Absolute 6.38      Lymphocytes Absolute 1.42      Monocytes Absolute 0.69      Eosinophils Absolute 0.19      Basophils Absolute 0.03      Immature Granulocytes Absolute 0.02      NRBC Absolute 0.00         ED Course & MDM   ED Course as of 12/12/22 1727   Sat Dec 12, 2022   1614 Alk phos previously elevated similarly with normal ast/alt and t bili    1655 EKG shows sinus rhythm rate of 87 normal intervals, no segment ST changes compared to prior July 22, 2021, no STEMI, ED independent interpretation   1710 Ct without acute IC process          Diagnoses as of 12/12/22 1727   Memory loss   Nausea       Medical Decision Making  Differential includes ACS to duration anemia near syncope lightheadedness vertigo electrolyte imbalances CVA TIA PE dissection     Patient here for evaluation of memory deficit which she reported a period where she could not remember a customer that was pain, denies any  stated headache chest pain shortness of breath, patient is awake alert oriented x 4 and incident happened over 48 hours ago, patient reports that her last episode of nausea vomiting was 6 days ago, has not had any recurrent symptoms at this time patient without any complaints of any symptoms including headache chest pain abdominal pain nausea vomiting patient is awake alert oriented x 4, patient able to recall this event, at this time CT head without any focal findings patient without any focal neurodeficit on exam to suggest CVA or TIA.  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    Escalation to admission/observation considered but at this time, patient is clinically improved and able to discharge home with close instructions for return and close follow up with outpt pcp       I did consider further diagnostic testing but at this time patient did not require as they are clinically asymptomatic without any complaints      I considered prescription medications for this patient but is there clinically improved on current regiment and over-the-counter medications, prescription meds not required at this time        Problems Addressed:  Memory loss: complicated acute illness or injury  Nausea: complicated acute illness or injury    Amount and/or Complexity of Data Reviewed  Labs: ordered. Decision-making details documented in ED Course.     Details: Unactionable   Radiology: ordered and independent interpretation performed. Decision-making details documented in ED Course.     Details: Ct head neg  for acute IC process   ECG/medicine tests: ordered and independent interpretation performed. Decision-making details documented in ED Course.                          Serina Cowper, MD  12/12/22 1727

## 2022-12-12 NOTE — ED Notes (Signed)
Pt reports she blacked out for a minutes on Thursday, no fall or LOC per employees who witnessed it, but they state she was not responding to others. Reports this happened once 2 years ago and she passed out, but someone caught her before hitting the floor.   Also reports some intermittent vomiting the past 2 weeks. Reports extensive GI history with surgeries.  A&Ox3, ambulatory.     Brantley Stage, RN  12/12/22 [phone]

## 2022-12-13 NOTE — Telephone Encounter (Signed)
Agree with ER eval for syncope.  She has hx of significant anemia in the past

## 2022-12-15 ENCOUNTER — Ambulatory Visit
Admit: 2022-12-15 | Discharge: 2022-12-15 | Payer: MEDICARE | Attending: Student in an Organized Health Care Education/Training Program | Primary: Family Medicine

## 2022-12-15 DIAGNOSIS — R413 Other amnesia: Secondary | ICD-10-CM

## 2022-12-15 NOTE — Progress Notes (Signed)
[facility]  [facility]  [address]  [address]  [place] [address]  Dept: [phone]  Dept Fax: [phone]     Patient ID: [name] is a 88 y.o. female who presents for Post Discharge Outreach (Per pt states she was seen the ED due to blackouts, pt was diagnosed with Memory Loss. Last episode of blackout was about 5 days ago. ).    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 Hx of carcinoid syndrome   . Hypothyroid   . Insomnia   . Iron deficiency anemia   . Neuropathy   . Peptic ulcer disease   . Skin cancer, basal cell   . Acquired short bowel syndrome   . At risk for falls   . Basal cell carcinoma (BCC) of abdomen   . Gastrointestinal hypomotility   . History of partial gastrectomy   . Incontinence of feces   . Postural syncope     Current Outpatient Medications   Medication Instructions   . amitriptyline (ELAVIL) 50 mg, oral, Nightly   . levothyroxine (SYNTHROID, LEVOXYL) 100 mcg, oral, Daily   . mirtazapine (Remeron) 15 mg tablet TAKE 1 TABLET BY MOUTH EVERYDAY AT BEDTIME   . multivitamin tablet 1 tablet, oral, Daily   . pantoprazole (ProtoNix) 40 mg EC tablet TAKE 1 TABLET BY MOUTH TWICE A DAY   . pregabalin (LYRICA) 50 mg, oral, Daily   . scopolamine (Transderm-Scop) 1 mg over 3 days patch 3 day 1 patch, transdermal, Every 72 hours PRN     No Known Allergies  Past Medical History:   Diagnosis Date   . Colon cancer (CMS-HCC) (HHS-HCC)    . Depression    . Duodenal ulcer    . GERD  (gastroesophageal reflux disease)    . Hx of carcinoid syndrome    . Hypothyroid    . Insomnia    . Iron deficiency anemia    . Neuropathy    . Peptic ulcer disease    . Skin cancer, basal cell      Past Surgical History:   Procedure Laterality Date   . APPENDECTOMY     . COLONOSCOPY     . ESOPHAGOGASTRODUODENOSCOPY  11/24/2019   . ESOPHAGOGASTRODUODENOSCOPY  04/05/2020   . EXPLORATORY LAPAROTOMY  03/12/2020    with partial gastrectomy and redo of Billroth II   . EXPLORATORY LAPAROTOMY  01/2019    with Cheree Ditto patch repair of duodenal ulcer and appendectomy   . LYSIS OF ADHESIONS  02/20/2020    with Billroth II   . RIGHT COLECTOMY  01/2019       Objective   Visit Vitals  BP 120/60   Pulse 87   Wt 44 kg   SpO2 95%   BMI 17.18 kg/m   OB Status Postmenopausal   BSA 1.4 m  Physical Exam  Constitutional:       Appearance: Normal appearance.   HENT:      Head: Normocephalic and atraumatic.      Mouth/Throat:      Mouth: Mucous membranes are moist.      Pharynx: Oropharynx is clear.   Eyes:      Extraocular Movements: Extraocular movements intact.      Pupils: Pupils are equal, round, and reactive to light.   Cardiovascular:      Rate and Rhythm: Normal rate and regular rhythm.      Pulses: Normal pulses.   Pulmonary:      Effort: Pulmonary effort is normal.      Breath sounds: Normal breath sounds.   Abdominal:      General: Abdomen is flat.      Palpations: Abdomen is soft.   Musculoskeletal:         General: Normal range of motion.      Cervical back: Normal range of motion and neck supple.   Skin:     General: Skin is warm and dry.      Capillary Refill: Capillary refill takes less than 2 seconds.   Neurological:      General: No focal deficit present.      Mental Status: She is alert and oriented to person, place, and time. Mental status is at baseline.      Cranial Nerves: No cranial nerve deficit.      Motor: No weakness.      Coordination: Coordination normal.   Psychiatric:         Mood and Affect: Mood  normal.         Behavior: Behavior normal.       Assessment/Plan   [name] was seen today for post discharge outreach.  Memory difficulty  88 yo female seen in the ED 5/25 for memory lapse that occurred late last week. No syncope or fall. Reports does not recall the time between one customer paying and the other customer paying over few mins. Patient unsure if was speaking during those minutes to her colleagues. Evaluated in the ED without concern. Head CT normal. Normal neuro exam today. Denies other incidences of memory deficits including with driving. Defer neuro eval at this time as does not appear progressive. Has follow up with PCP in 2 months.    Per patient, chronic vomiting being treated with GI, no acute concerns today.

## 2022-12-29 MED ORDER — Motegrity 1 mg tablet
1 | ORAL_TABLET | Freq: Every day | ORAL | 1 refills | Status: DC
Start: 2022-12-29 — End: 2023-03-06

## 2022-12-30 ENCOUNTER — Encounter: Payer: MEDICARE | Attending: Family Medicine | Primary: Family Medicine

## 2023-02-03 ENCOUNTER — Encounter: Payer: MEDICARE | Attending: Family Medicine | Primary: Family Medicine

## 2023-02-03 ENCOUNTER — Ambulatory Visit: Admit: 2023-02-03 | Discharge: 2023-02-03 | Payer: MEDICARE | Attending: Family Medicine | Primary: Family Medicine

## 2023-02-03 DIAGNOSIS — R413 Other amnesia: Secondary | ICD-10-CM

## 2023-02-03 MED ORDER — pantoprazole (ProtoNix) 40 mg EC tablet
40 | ORAL_TABLET | Freq: Two times a day (BID) | ORAL | 1 refills | Status: AC
Start: 2023-02-03 — End: ?

## 2023-02-03 NOTE — Progress Notes (Signed)
[facility]  [facility]  [address]  [address]  [place] [address]  Dept: [phone]  Dept Fax: [phone]     Patient ID: [name] is a 88 y.o. female who presents for 6 months    states has periods of not knowing where she was (Or what she was doing  She was working ).    Subjective   HPI  Here in follow up.    Had episode in May where she 'blanked out' while working.     No syncope.    Was unsure of what to do when customer gave her money.  Had workup with lab testing.  CT head  which were OK.   Current Outpatient Medications   Medication Instructions    amitriptyline (ELAVIL) 50 mg, oral, Nightly    levothyroxine (SYNTHROID, LEVOXYL) 100 mcg, oral, Daily    mirtazapine (Remeron) 15 mg tablet TAKE 1 TABLET BY MOUTH EVERYDAY AT BEDTIME    Motegrity 1 mg, oral, Daily    multivitamin tablet 1 tablet, oral, Daily    pantoprazole (PROTONIX) 40 mg, oral, 2 times daily, Do not crush, chew, or split.    pregabalin (LYRICA) 50 mg, oral, Daily    scopolamine (Transderm-Scop) 1 mg over 3 days patch 3 day 1 patch, transdermal, Every 72 hours PRN   All:  none  Review of Systems   Constitutional: Negative.    HENT: Negative.     Eyes: Negative.    Respiratory: Negative.     Cardiovascular: Negative.    Gastrointestinal:  Positive for constipation, diarrhea, nausea and vomiting.   Genitourinary: Negative.    Musculoskeletal: Negative.    Neurological: Negative.    Psychiatric/Behavioral:  Positive for memory loss.         Objective   Visit Vitals  BP 110/68   Temp 36.8 C (98.3 F)   Wt 43.1 kg   BMI 16.83 kg/m   OB Status Postmenopausal   BSA 1.38 m       Physical Exam  BP 110/68   Temp 36.8 C (98.3 F)   Wt 43.1 kg   BMI 16.83 kg/m     General Appearance:    Alert, cooperative, no distress, appears stated age   Head:    Normocephalic, without obvious abnormality, atraumatic   Eyes:    PERRL, conjunctiva/corneas clear, EOM's intact, fundi     benign, both eyes   Ears:    Normal TM's  and external ear canals, both ears   Nose:   Nares normal, septum midline, mucosa normal, no drainage     or sinus tenderness   Throat:   Lips, mucosa, and tongue normal; teeth and gums normal   Neck:   Supple, symmetrical, trachea midline, no adenopathy;     thyroid:  no enlargement/tenderness/nodules; no carotid    bruit or JVD   Back:     Symmetric, no curvature, ROM normal, no CVA tenderness   Lungs:     Clear to auscultation bilaterally, respirations unlabored   Chest Wall:    No tenderness or deformity    Heart:    Regular rate and rhythm, S1 and S2 normal, no murmur, rub    or gallop   Breast Exam:       Abdomen:     Soft, non-tender, bowel sounds active all four quadrants,     no masses, no organomegaly   Genitalia:     Rectal:     Extremities:  Extremities normal, atraumatic, no cyanosis or edema   Pulses:   2+ and symmetric all extremities   Skin:   Skin color, texture, turgor normal, no rashes or lesions   Lymph nodes:   Cervical, supraclavicular, and axillary nodes normal   Neurologic:   CNII-XII intact, normal strength, sensation and reflexes     throughout        Assessment/Plan   [name] was seen today for 6 months    states has periods of not knowing where she was.  Memory difficulty  Comments:  check labs.  CT showed only age related changes  Orders:  -     Vitamin B12; Future  -     Basic metabolic panel; Future  Iron deficiency anemia, unspecified iron deficiency anemia type  Comments:  hx of this.  recheck  Orders:  -     CBC and differential - WAM and non-WAM; Future  -     IRON, FERRITIN, AND TIBC; Future  Acquired hypothyroidism  Comments:  recheck and adjust dose prn  Orders:  -     TSH; Future  Gastric outlet obstruction (HHS-HCC)  Comments:  needs motegrity now  Psychophysiological insomnia  Comments:  on medications for this that help  Neuropathy  Comments:  stable  Dizziness  Comments:  encourage hydration.  monitor BP and blood sugar  Gastroesophageal reflux disease, unspecified whether  esophagitis present  Comments:  on PPI  Orders:  -     pantoprazole (ProtoNix) 40 mg EC tablet; Take 1 tablet (40 mg) by mouth twice daily. Do not crush, chew, or split.

## 2023-02-04 LAB — CBC W/DIFF
Baso Abs: 0 10*3/uL (ref 0.0–0.2)
Basos: 0 %
Eos Abs: 0.2 10*3/uL (ref 0.0–0.4)
Eos: 3 %
Hct: 37.9 % (ref 34.0–46.6)
Hgb: 12 g/dL (ref 11.1–15.9)
Immature Grans Abs: 0 10*3/uL (ref 0.0–0.1)
Immature Granulocytes: 0 %
Lymphs Abs: 1.5 10*3/uL (ref 0.7–3.1)
Lymphs: 22 %
MCH: 28.6 pg (ref 26.6–33.0)
MCHC: 31.7 g/dL (ref 31.5–35.7)
MCV: 90 fL (ref 79–97)
Monocytes: 10 %
MonocytesAbs: 0.7 10*3/uL (ref 0.1–0.9)
Neutrophils Abs: 4.3 10*3/uL (ref 1.4–7.0)
Neutrophils: 65 %
Platelets: 237 10*3/uL (ref 150–450)
RBC: 4.2 x10E6/uL (ref 3.77–5.28)
RDW: 13.6 % (ref 11.7–15.4)
WBC: 6.7 10*3/uL (ref 3.4–10.8)

## 2023-02-04 LAB — BASIC METABOLIC PANEL
Anion Gap: 12 mmol/L (ref 10.0–18.0)
BUN/Creat Ratio: 34 — ABNORMAL HIGH (ref 12–28)
BUN: 17 mg/dL (ref 8–27)
Calcium: 8.6 mg/dL — ABNORMAL LOW (ref 8.7–10.3)
Carbon Dioxide: 24 mmol/L (ref 20–29)
Chloride: 106 mmol/L (ref 96–106)
Creat: 0.5 mg/dL — ABNORMAL LOW (ref 0.57–1.00)
Glucose: 69 mg/dL — ABNORMAL LOW (ref 70–99)
Potassium: 4.6 mmol/L (ref 3.5–5.2)
Sodium: 142 mmol/L (ref 134–144)
eGFR: 91 mL/min/{1.73_m2} (ref >59)

## 2023-02-04 LAB — IRON, FERRITIN, TIBC
Ferritin: 15 ng/mL (ref 15–150)
Iron Bind.Cap.(TIBC): 368 ug/dL (ref 250–450)
Iron Saturation: 11 % — ABNORMAL LOW (ref 15–55)
Iron: 42 ug/dL (ref 27–139)
UIBC: 326 ug/dL (ref 118–369)

## 2023-02-04 LAB — TSH: TSH: 5.61 u[IU]/mL — ABNORMAL HIGH (ref 0.450–4.500)

## 2023-02-04 LAB — VITAMIN B12: Vitamin B12: 646 pg/mL (ref 232–1245)

## 2023-03-08 MED ORDER — Motegrity 1 mg tablet
1 | ORAL_TABLET | Freq: Every day | ORAL | 1 refills | Status: DC
Start: 2023-03-08 — End: 2023-03-08

## 2023-03-08 MED ORDER — Motegrity 1 mg tablet
1 | ORAL_TABLET | Freq: Every day | ORAL | 1 refills | Status: DC
Start: 2023-03-08 — End: 2023-05-05

## 2023-03-21 MED ORDER — pregabalin (Lyrica) 50 mg capsule
50 | ORAL_CAPSULE | Freq: Every day | ORAL | 1 refills | Status: AC
Start: 2023-03-21 — End: 2024-03-20

## 2023-04-27 MED ORDER — amitriptyline (Elavil) 50 mg tablet
50 | ORAL_TABLET | Freq: Every evening | ORAL | 1 refills | Status: AC
Start: 2023-04-27 — End: 2024-04-26

## 2023-04-27 NOTE — Telephone Encounter (Signed)
amitriptyline (Elavil) 50 mg tablet     Last filled: 10/24/22 qty 90 with 1 refill   Pt booked for 3 month follow up with PH on 05/05/23

## 2023-04-27 NOTE — Telephone Encounter (Signed)
Duplicated request.

## 2023-04-27 NOTE — Telephone Encounter (Signed)
MIRTAZAPINE 15 MG TABLET   Last filled: 08/05/22 qty 90 with 1 refill   Last cpe: pt is booked 3 month follow up with PH on 05/05/23

## 2023-05-05 ENCOUNTER — Ambulatory Visit: Admit: 2023-05-05 | Discharge: 2023-05-05 | Payer: MEDICARE | Attending: Family Medicine | Primary: Family Medicine

## 2023-05-05 DIAGNOSIS — K311 Adult hypertrophic pyloric stenosis: Secondary | ICD-10-CM

## 2023-05-05 MED ORDER — Motegrity 1 mg tablet
1 | ORAL_TABLET | Freq: Every day | ORAL | 1 refills | Status: DC
Start: 2023-05-05 — End: 2023-07-26

## 2023-05-05 NOTE — Progress Notes (Signed)
[facility]  [facility]  [address]  [address]  [place] [address]  Dept: [phone]  Dept Fax: [phone]     Patient ID: [name] is a 88 y.o. female who presents for 3 month check  still continues with dizzyness.    Subjective   HPI  Here in follow up.    Dizzy several times per week.   Lightheaded, but no fainting.   Lasts a few seconds.     Hx of multiple gastric surgeries.   Sugar runs low at times.   No chest pain, no shortness of breath.   Swallowing OK.      Weight stable.   Still working at [employer]  3 days per week.  Not sleeping well.        Current Outpatient Medications   Medication Instructions    amitriptyline (ELAVIL) 50 mg, oral, Nightly    levothyroxine (SYNTHROID, LEVOXYL) 100 mcg, oral, Daily    mirtazapine (Remeron) 15 mg tablet TAKE 1 TABLET BY MOUTH EVERYDAY AT BEDTIME    Motegrity 1 mg, oral, Daily    multivitamin tablet 1 tablet, oral, Daily    pantoprazole (PROTONIX) 40 mg, oral, 2 times daily, Do not crush, chew, or split.    pregabalin (LYRICA) 50 mg, oral, Daily     Review of Systems   Constitutional: Negative.    HENT: Negative.     Eyes: Negative.    Respiratory: Negative.     Cardiovascular: Negative.    Gastrointestinal:  Positive for diarrhea and nausea.   Genitourinary: Negative.    Musculoskeletal: Negative.    Skin: Negative.    Neurological: Negative.    Psychiatric/Behavioral:  The patient has insomnia.       Objective   Visit Vitals  BP 110/72   Temp 37.1 C (98.7 F)   Wt 42.2 kg   BMI 16.47 kg/m   OB Status Postmenopausal   BSA 1.37 m       Physical Exam  BP 110/72   Temp 37.1 C (98.7 F)   Wt 42.2 kg   BMI 16.47 kg/m     General Appearance:    Alert, cooperative, no distress, appears stated age   Head:    Normocephalic, without obvious abnormality, atraumatic   Eyes:    PERRL, conjunctiva/corneas clear, EOM's intact, fundi     benign, both eyes        Ears:    Normal TM's and external ear canals, both ears   Nose:   Nares  normal, septum midline, mucosa normal, no drainage    or sinus tenderness   Throat:   Lips, mucosa, and tongue normal; teeth and gums normal   Neck:   Supple, symmetrical, trachea midline, no adenopathy;        thyroid:  No enlargement/tenderness/nodules; no carotid    bruit or JVD   Back:     Symmetric, no curvature, ROM normal, no CVA tenderness   Lungs:     Clear to auscultation bilaterally, respirations unlabored   Chest wall:    No tenderness or deformity   Heart:    Regular rate and rhythm, S1 and S2 normal, no murmur, rub    or gallop   Abdomen:     Soft, non-tender, bowel sounds active all four quadrants,     no masses, no organomegaly   Genitalia:     Rectal:     Extremities:   Extremities normal, atraumatic, no cyanosis or  edema   Pulses:   2+ and symmetric all extremities   Skin:   Skin color, texture, turgor normal, no rashes or lesions   Lymph nodes:   Cervical, supraclavicular, and axillary nodes normal   Neurologic:   CNII-XII intact. Normal strength, sensation and reflexes       throughout         Assessment/Plan   [name] was seen today for 3 month check  still continues with dizzyness.  Gastric outlet obstruction (HHS-HCC)  Comments:  improved  on motegrity.   stable lately  Orders:  -     Motegrity 1 mg tablet; Take 1 tablet (1 mg) by mouth once daily.  Dizziness  Comments:  labs OK.   tx known hypoglycemia  Orders:  -     Basic metabolic panel; Future  Iron deficiency anemia, unspecified iron deficiency anemia type  Comments:  recheck anemia,   stable lately  Orders:  -     CBC and differential - WAM and non-WAM; Future  Acquired hypothyroidism (CMS-HCC)  Comments:  recheck TSH and adjust med prn  Orders:  -     TSH; Future  Psychophysiological insomnia  Comments:  medications help  Neuropathy  Comments:  on lyrica , helps  Gastroesophageal reflux disease, unspecified whether esophagitis present  Comments:  on long term PPI  Underweight  Comments:  encourage nutritional supplements

## 2023-05-06 LAB — BASIC METABOLIC PANEL
Anion Gap: 16 mmol/L (ref 10.0–18.0)
BUN/Creat Ratio: 31 — ABNORMAL HIGH (ref 12–28)
BUN: 20 mg/dL (ref 8–27)
Calcium: 8.7 mg/dL (ref 8.7–10.3)
Carbon Dioxide: 22 mmol/L (ref 20–29)
Chloride: 102 mmol/L (ref 96–106)
Creat: 0.65 mg/dL (ref 0.57–1.00)
Glucose: 77 mg/dL (ref 70–99)
Potassium: 4.5 mmol/L (ref 3.5–5.2)
Sodium: 140 mmol/L (ref 134–144)
eGFR: 86 mL/min/{1.73_m2} (ref >59)

## 2023-05-06 LAB — CBC W/DIFF
Baso Abs: 0 10*3/uL (ref 0.0–0.2)
Basos: 0 %
Eos Abs: 0 10*3/uL (ref 0.0–0.4)
Eos: 1 %
Hct: 42.5 % (ref 34.0–46.6)
Hgb: 13.8 g/dL (ref 11.1–15.9)
Immature Grans Abs: 0 10*3/uL (ref 0.0–0.1)
Immature Granulocytes: 0 %
Lymphs Abs: 1.5 10*3/uL (ref 0.7–3.1)
Lymphs: 24 %
MCH: 29.2 pg (ref 26.6–33.0)
MCHC: 32.5 g/dL (ref 31.5–35.7)
MCV: 90 fL (ref 79–97)
Monocytes: 15 %
MonocytesAbs: 1 10*3/uL — ABNORMAL HIGH (ref 0.1–0.9)
Neutrophils Abs: 3.8 10*3/uL (ref 1.4–7.0)
Neutrophils: 60 %
Platelets: 264 10*3/uL (ref 150–450)
RBC: 4.73 x10E6/uL (ref 3.77–5.28)
RDW: 13.8 % (ref 11.7–15.4)
WBC: 6.3 10*3/uL (ref 3.4–10.8)

## 2023-05-06 LAB — TSH: TSH: 7.55 u[IU]/mL — ABNORMAL HIGH (ref 0.450–4.500)

## 2023-06-14 MED ORDER — prucalopride (Motegrity) 2 mg tablet
2 | ORAL_TABLET | Freq: Every day | ORAL | 0 refills | Status: AC
Start: 2023-06-14 — End: ?

## 2023-06-30 MED ORDER — levothyroxine (Synthroid, Levoxyl) 100 mcg tablet
100 | ORAL_TABLET | Freq: Every day | ORAL | 0 refills | 90.00000 days | Status: AC
Start: 2023-06-30 — End: ?

## 2023-07-27 MED ORDER — Motegrity 1 mg tablet
1 | ORAL_TABLET | Freq: Every day | ORAL | 1 refills | Status: DC
Start: 2023-07-27 — End: 2023-07-30

## 2023-07-28 ENCOUNTER — Encounter: Payer: MEDICARE | Attending: Family Medicine | Primary: Family Medicine

## 2023-07-31 MED ORDER — Motegrity 1 mg tablet
1 | ORAL_TABLET | Freq: Every day | ORAL | 1 refills | Status: DC
Start: 2023-07-31 — End: 2023-10-05

## 2023-08-04 ENCOUNTER — Ambulatory Visit: Admit: 2023-08-04 | Discharge: 2023-08-04 | Payer: MEDICARE | Attending: Family Medicine | Primary: Family Medicine

## 2023-08-04 DIAGNOSIS — K311 Adult hypertrophic pyloric stenosis: Secondary | ICD-10-CM

## 2023-08-04 NOTE — Progress Notes (Signed)
[facility]  [facility]  [address]  [address]  [place] [address]  Dept: [phone]  Dept Fax: [phone]     Patient ID: [name] is a 89 y.o. female who presents for Follow-up (Feeling ok good and bad days   stomach issue).    Subjective   HPI  Here in follow up.    Weight is stable.     Taking motegrity 1 mg daily     Hx of multiple gastric surgeries.   Sugar runs low at times.  Occasional dizziness.     No chest pain, no shortness of breath.   Swallowing OK.      Weight stable.   Still working at [employer]  3 days per week.   Sleeping OK.         Current Outpatient Medications   Medication Instructions    amitriptyline (ELAVIL) 50 mg, oral, Nightly    levothyroxine (SYNTHROID, LEVOXYL) 100 mcg, oral, Daily    mirtazapine (Remeron) 15 mg tablet TAKE 1 TABLET BY MOUTH EVERYDAY AT BEDTIME    Motegrity 2 mg, oral, Daily    Motegrity 1 mg, oral, Daily    multivitamin tablet 1 tablet, oral, Daily    pantoprazole (PROTONIX) 40 mg, oral, 2 times daily, Do not crush, chew, or split.    pregabalin (LYRICA) 50 mg, oral, Daily   All:  none  soc:   no smoking.   No alcohol.   Eats small quantities.     Review of Systems   Constitutional: Negative.    HENT: Negative.     Eyes: Negative.    Respiratory: Negative.     Cardiovascular: Negative.    Gastrointestinal:  Positive for diarrhea and nausea.   Genitourinary: Negative.    Musculoskeletal:  Positive for joint pain.   Skin: Negative.    Neurological: Negative.    Psychiatric/Behavioral:  The patient has insomnia.       Objective   Visit Vitals  BP 122/80   Temp 37 C (98.6 F)   Wt 43.1 kg   BMI 16.83 kg/m   OB Status Postmenopausal   BSA 1.38 m       Physical Exam  BP 122/80   Temp 37 C (98.6 F)   Wt 43.1 kg   BMI 16.83 kg/m     General Appearance:    Alert, cooperative, no distress, appears stated age   Head:    Normocephalic, without obvious abnormality, atraumatic   Eyes:    PERRL, conjunctiva/corneas clear, EOM's  intact, fundi     benign, both eyes        Ears:    Normal TM's and external ear canals, both ears   Nose:   Nares normal, septum midline, mucosa normal, no drainage    or sinus tenderness   Throat:   Lips, mucosa, and tongue normal; teeth and gums normal   Neck:   Supple, symmetrical, trachea midline, no adenopathy;        thyroid:  No enlargement/tenderness/nodules; no carotid    bruit or JVD   Back:     Symmetric, no curvature, ROM normal, no CVA tenderness   Lungs:     Clear to auscultation bilaterally, respirations unlabored   Chest wall:    No tenderness or deformity   Heart:    Regular rate and rhythm, S1 and S2 normal, no murmur, rub    or gallop   Abdomen:     Soft, non-tender,  bowel sounds active all four quadrants,     no masses, no organomegaly   Genitalia:     Rectal:     Extremities:   Extremities normal, atraumatic, no cyanosis or edema   Pulses:   2+ and symmetric all extremities   Skin:   Skin color, texture, turgor normal, no rashes or lesions   Lymph nodes:   Cervical, supraclavicular, and axillary nodes normal   Neurologic:   CNII-XII intact. Normal strength, sensation and reflexes       throughout         Assessment/Plan   [name] was seen today for follow-up.  Gastric outlet obstruction (HHS-HCC)  Comments:  needs motegrity daily.  still eating small amounts, but weight stable  Iron deficiency anemia, unspecified iron deficiency anemia type  Comments:  stable lately  recheck  Orders:  -     CBC and differential - WAM and non-WAM; Future  Acquired hypothyroidism  Comments:  TSH high when last checked.  recheck  Orders:  -     TSH; Future  Dizziness  Comments:  vitals fine.  check labs.    monitor HR and BP when she feels dizzy  Orders:  -     Basic metabolic panel; Future  Psychophysiological insomnia  Comments:  controlled on current regimen  Underweight  Comments:  encourage nutritional supplements

## 2023-08-07 LAB — CBC W/DIFF
Baso Abs: 0 10*3/uL (ref 0.0–0.2)
Basos: 0 %
Eos Abs: 0.1 10*3/uL (ref 0.0–0.4)
Eos: 1 %
Hct: 39.5 % (ref 34.0–46.6)
Hgb: 12.4 g/dL (ref 11.1–15.9)
Immature Grans Abs: 0 10*3/uL (ref 0.0–0.1)
Immature Granulocytes: 0 %
Lymphs Abs: 1.8 10*3/uL (ref 0.7–3.1)
Lymphs: 22 %
MCH: 28.9 pg (ref 26.6–33.0)
MCHC: 31.4 g/dL — ABNORMAL LOW (ref 31.5–35.7)
MCV: 92 fL (ref 79–97)
Monocytes: 10 %
MonocytesAbs: 0.8 10*3/uL (ref 0.1–0.9)
Neutrophils Abs: 5.5 10*3/uL (ref 1.4–7.0)
Neutrophils: 67 %
Platelets: 242 10*3/uL (ref 150–450)
RBC: 4.29 x10E6/uL (ref 3.77–5.28)
RDW: 13.4 % (ref 11.7–15.4)
WBC: 8.2 10*3/uL (ref 3.4–10.8)

## 2023-08-07 LAB — BASIC METABOLIC PANEL
Anion Gap: 12 mmol/L (ref 10.0–18.0)
BUN/Creat Ratio: 38 — ABNORMAL HIGH (ref 12–28)
BUN: 23 mg/dL (ref 8–27)
Calcium: 8 mg/dL — ABNORMAL LOW (ref 8.7–10.3)
Carbon Dioxide: 22 mmol/L (ref 20–29)
Chloride: 104 mmol/L (ref 96–106)
Creat: 0.61 mg/dL (ref 0.57–1.00)
Glucose: 86 mg/dL (ref 70–99)
Potassium: 4.6 mmol/L (ref 3.5–5.2)
Sodium: 138 mmol/L (ref 134–144)
eGFR: 87 mL/min/{1.73_m2} (ref >59)

## 2023-08-07 LAB — TSH: TSH: 9.21 u[IU]/mL — ABNORMAL HIGH (ref 0.450–4.500)

## 2023-08-09 MED ORDER — levothyroxine (Synthroid, Levoxyl) 112 mcg tablet
112 | ORAL_TABLET | Freq: Every day | ORAL | 1 refills | 90.00000 days | Status: AC
Start: 2023-08-09 — End: 2024-02-05

## 2023-09-16 MED ORDER — pregabalin (Lyrica) 50 mg capsule
50 | ORAL_CAPSULE | Freq: Every day | ORAL | 1 refills | Status: AC
Start: 2023-09-16 — End: 2024-03-14

## 2023-10-06 MED ORDER — Motegrity 1 mg tablet
1 | ORAL_TABLET | Freq: Every day | ORAL | 1 refills | Status: DC
Start: 2023-10-06 — End: 2023-10-26

## 2023-10-25 MED ORDER — prucalopride (Motegrity) 2 mg tablet
2 | ORAL_TABLET | Freq: Every day | ORAL | 1 refills | Status: DC
Start: 2023-10-25 — End: 2023-10-26

## 2023-10-26 MED ORDER — Motegrity 1 mg tablet
1 | ORAL_TABLET | Freq: Every day | ORAL | 1 refills | Status: AC
Start: 2023-10-26 — End: ?

## 2023-10-26 NOTE — Telephone Encounter (Signed)
 noted

## 2023-11-11 MED ORDER — amitriptyline (Elavil) 50 mg tablet
50 | ORAL_TABLET | Freq: Every evening | ORAL | 6 refills | 30.00000 days | Status: DC
Start: 2023-11-11 — End: 2024-02-02

## 2023-12-03 MED ORDER — ondansetron (Zofran) 4 mg tablet
4 | ORAL_TABLET | Freq: Three times a day (TID) | ORAL | 1 refills | 7.00000 days | Status: DC | PRN
Start: 2023-12-03 — End: 2023-12-22

## 2023-12-03 NOTE — Patient Instructions (Unsigned)
 Denied. Is not supportive for nausea.

## 2023-12-20 NOTE — Telephone Encounter (Signed)
 Spoke with patient, confirmed full name and DOB  Ondansetron is not working, still feels nauseous.  Is having heartburn and nausea, patient was taking antacid chewables, little to no effect.  Patient states she always has diarrhea but is now constipated, seems like since she started taking Ondansetron,   Patient wants to stop taking it. Is only moving very small amounts of stool.  No pain, cramping or dissension  Trying to hydrate well, states she could do better and is trying.  No difficulty breathing, lightheadedness, dizziness, chest, arm, neck, shoulder or back pain.   No numbness or tingling in fingers, arms.     Advised to continue with hydrating as much as she is able, tums, dramamine, bland diet, Miralax. Is scheduled with [facility] 12/22/23

## 2023-12-22 ENCOUNTER — Ambulatory Visit: Admit: 2023-12-22 | Discharge: 2023-12-22 | Payer: MEDICARE | Primary: Family Medicine

## 2023-12-22 DIAGNOSIS — R079 Chest pain, unspecified: Secondary | ICD-10-CM

## 2023-12-22 NOTE — Assessment & Plan Note (Signed)
 In anywhere he her TSH was greater than 9  This was increased from her previous level  Her dose was increased to 112 mcg from 100 mcg  Repeat TSH and further action pending results

## 2023-12-22 NOTE — Assessment & Plan Note (Signed)
 Last calcium was 8.0 in January  Repeat ordered  Further action pending results

## 2023-12-22 NOTE — Progress Notes (Signed)
 [facility]  [address]  [address]  [place] [address]  Dept: [phone]  Dept Fax: [phone]     Patient ID: [name] is a 89 y.o. female who presents for Heartburn.    Subjective   HPI  She has a history of PUD, with UGI bleed, partial gastrectomy, GERD, carcinoid, hypothyroid, appendiceal cancer and resection, depression, poor appetite and weight loss s/p Billroth 2 gastrojejunostomy  She seems somewhat unfamiliar with the fact that she has had carcinoid  89 year old presents for acute care visit c/o 1 week h/o severe nausea copious eructation which "gets stuck in her throat" and causes severe pain to her upper mid chest  Also endorses chronic lightheadedness with quick movements.  She drinks 8 to 10 ounces of fluid a day (water)  she has a history of chronic foul flatulence which is currently well-controlled with over-the-counter medication she cannot remember the name of it .states that the Zofran given to her by Dr. Reginald Capri caused severe heartburn burn and constipation  She stopped the Zofran 2 days ago and without any discernible improvement    She has lost almost 6 pounds since January2025    Denies any fever, chills, sweats endorses constipation which she will treat after she gets home.  She states that as soon as she uses the MiraLAX she gets instantaneous results  In the office she is denying any pain  Last CMP was done January 2025 and her calcium was 8.0 down from 8.7(of note it has been fluctuating from 8.2-8.7)  Her last TSH was 9.2 and her dose of levothyroxine was increased from 100 mcg daily to 112 mcg daily  She has 3 meals/day   She works as a Marketing executive hrs/wk at [employer]  She is trying to prepare her own meals now that she has her own appt senior housing(was living with her daughter who kicked her out)  Problem List[1]  Current Outpatient Medications   Medication Instructions    amitriptyline (ELAVIL) 50 mg, oral, Nightly    levothyroxine (SYNTHROID, LEVOXYL)  112 mcg, oral, Daily    mirtazapine (Remeron) 15 mg tablet TAKE 1 TABLET BY MOUTH EVERYDAY AT BEDTIME    Motegrity 1 mg, oral, Daily    multivitamin tablet 1 tablet, oral, Daily    pantoprazole (PROTONIX) 40 mg, oral, 2 times daily, Do not crush, chew, or split.    pregabalin (LYRICA) 50 mg, oral, Daily     Allergies[2]  Medical History[3]  Surgical History[4]  Family History[5]  Social History[6]  Review of Systems    Objective   Visit Vitals  BP (!) 137/48   Pulse 89   Temp 36.7 C (98.1 F) (Oral)   Wt 40.4 kg Comment: 89LBS   SpO2 95%   BMI 15.77 kg/m   OB Status Postmenopausal   BSA 1.34 m     Physical Exam  Petite cachectic 89 year old very well-groomed alert and cooperative.  She is somewhat confused and about her history and medications and flustered at times  HEENT: Sclera and conjunctiva are clear no rhinorrhea airway patent mucous membranes are moist  Lungs: Clear bilaterally with good excursion  Heart: Regular rate and rhythm no murmurs rubs or gallops  Abdomen: Distended slightly tympanitic particularly to the left upper quadrant she has a well-healed scar to the left upper quadrant and also a midline vertical scar which is also well-healed   she is nontender to palpation,,no pulsatile masses or bruits  Extremities: No clubbing cyanosis  edema calf tenderness erythema or varicosities distal pulses are 2+ and symmetrical.  She does have arthritic changes to her hands but no evidence of acute effusion  Assessment/Plan   Simpson was seen today for heartburn.  Chest pain, unspecified type  Assessment & Plan:  She feels that this discomfort is a result of the burping in the air getting trapped in her esophagus and she feels pain to her upper mid chest.  It could be part of her GI, however cardiac is a concern  She is declined to go to the emergency room at this time  She may need a stress test  Will order basic labs and have her return to discuss results  If she has increasing or persistent discomfort I  have strongly urged her to go to the emergency room for further evaluation  Orders:  -     CBC and differential - WAM and non-WAM; Future  -     Comprehensive metabolic panel; Future  -     C-reactive protein; Future  -     Sedimentation rate, automated; Future  -     ECG 12 lead [facility] Performed); Future  Hypocalcemia  Assessment & Plan:  Last calcium was 8.0 in January  Repeat ordered  Further action pending results  Orders:  -     Comprehensive metabolic panel; Future  Nausea  -     CBC and differential - WAM and non-WAM; Future  -     Comprehensive metabolic panel; Future  Flatulence, eructation, and gas pain  Assessment & Plan:  The painful burping she states is new to her  The flatulence is old and bloating is chronic  I encouraged her to go to the emergency room as cardiac potential comes to mind  She is adamant she is not going  Therefore I will order an EKG and some labs and have her follow-up either with myself or Dr. Reginald Capri  Orders:  -     CBC and differential - WAM and non-WAM; Future  Other specified hypothyroidism   Assessment & Plan:  In anywhere he her TSH was greater than 9  This was increased from her previous level  Her dose was increased to 112 mcg from 100 mcg  Repeat TSH and further action pending results  Orders:  -     TSH with reflex; Future                               [1]   Patient Active Problem List  Diagnosis    Hx of carcinoid syndrome    Hypothyroid     Insomnia    Iron deficiency anemia    Neuropathy    Peptic ulcer disease    Skin cancer, basal cell    Acquired short bowel syndrome     At risk for falls    Basal cell carcinoma (BCC) of abdomen    Gastrointestinal hypomotility    History of partial gastrectomy    Incontinence of feces    Postural syncope    Chest pain    Hypocalcemia    Nausea    Flatulence, eructation, and gas pain    Bowel obstruction (Multi-HCC)   [2] No Known Allergies  [3]   Past Medical History:  Diagnosis Date    Colon cancer (Multi-HCC)     Depression       Duodenal ulcer     GERD (gastroesophageal reflux  disease)     Hx of carcinoid syndrome     Hypothyroid      Insomnia     Iron deficiency anemia     Neuropathy     Peptic ulcer disease     Skin cancer, basal cell    [4]   Past Surgical History:  Procedure Laterality Date    APPENDECTOMY      COLONOSCOPY      ESOPHAGOGASTRODUODENOSCOPY  11/24/2019    ESOPHAGOGASTRODUODENOSCOPY  04/05/2020    EXPLORATORY LAPAROTOMY  03/12/2020    with partial gastrectomy and redo of Billroth II    EXPLORATORY LAPAROTOMY  01/2019    with Tyrone Gallop patch repair of duodenal ulcer and appendectomy    LYSIS OF ADHESIONS  02/20/2020    with Billroth II    RIGHT COLECTOMY  01/2019   [5] No family history on file.  [6]   Social History  Socioeconomic History    Marital status: Widowed     Spouse name: Not on file    Number of children: Not on file    Years of education: Not on file    Highest education level: Not on file   Occupational History    Not on file   Tobacco Use    Smoking status: Never     Passive exposure: Never    Smokeless tobacco: Never   Vaping Use    Vaping status: Never Used   Substance and Sexual Activity    Alcohol use: Not Currently    Drug use: Never    Sexual activity: Defer   Other Topics Concern    Not on file   Social History Narrative    Not on file     Social Determinants of Health     Financial Resource Strain: Not on file   Food Insecurity: Not on file   Transportation Needs: Not on file   Physical Activity: Not on file   Stress: Not on file   Social Connections: Not on file   Intimate Partner Violence: Not on file   Housing Stability: Not on file

## 2023-12-22 NOTE — Assessment & Plan Note (Signed)
 The painful burping she states is new to her  The flatulence is old and bloating is chronic  I encouraged her to go to the emergency room as cardiac potential comes to mind  She is adamant she is not going  Therefore I will order an EKG and some labs and have her follow-up either with myself or Dr. Reginald Capri

## 2023-12-22 NOTE — Assessment & Plan Note (Signed)
 She feels that this discomfort is a result of the burping in the air getting trapped in her esophagus and she feels pain to her upper mid chest.  It could be part of her GI, however cardiac is a concern  She is declined to go to the emergency room at this time  She may need a stress test  Will order basic labs and have her return to discuss results  If she has increasing or persistent discomfort I have strongly urged her to go to the emergency room for further evaluation

## 2023-12-23 ENCOUNTER — Emergency Department: Payer: MEDICARE | Primary: Family Medicine

## 2023-12-23 DIAGNOSIS — K56609 Unspecified intestinal obstruction, unspecified as to partial versus complete obstruction: Secondary | ICD-10-CM

## 2023-12-23 DIAGNOSIS — K56699 Other intestinal obstruction unspecified as to partial versus complete obstruction: Principal | ICD-10-CM

## 2023-12-23 DIAGNOSIS — R1030 Lower abdominal pain, unspecified: Secondary | ICD-10-CM

## 2023-12-23 MED ORDER — diphenhydrAMINE (BENADryl) injection 12.5 mg
50 | Freq: Once | INTRAMUSCULAR | Status: AC
Start: 2023-12-23 — End: 2023-12-23
  Administered 2023-12-24: 03:00:00 12.5 mg via INTRAVENOUS

## 2023-12-23 MED ORDER — iohexol (OMNIPaque) 240 mg iodine/mL solution 50 mL
240 | Freq: Once | INTRAVENOUS | Status: AC
Start: 2023-12-23 — End: 2023-12-24
  Administered 2023-12-24: 04:00:00 50 mL via ORAL

## 2023-12-23 MED ORDER — sodium chloride 0.9 % bolus 500 mL
0.9 | Freq: Once | INTRAVENOUS | Status: AC
Start: 2023-12-23 — End: 2023-12-23
  Administered 2023-12-24: 03:00:00 500 mL via INTRAVENOUS

## 2023-12-23 MED ORDER — famotidine (Pepcid) injection 20 mg
20 | Freq: Once | INTRAVENOUS | Status: AC
Start: 2023-12-23 — End: 2023-12-23
  Administered 2023-12-24: 03:00:00 20 mg via INTRAVENOUS

## 2023-12-23 MED ORDER — metoclopramide (Reglan) injection 10 mg
5 | Freq: Once | INTRAMUSCULAR | Status: AC
Start: 2023-12-23 — End: 2023-12-23
  Administered 2023-12-24: 03:00:00 10 mg via INTRAVENOUS

## 2023-12-23 NOTE — ED Progress Note (Signed)
 [facility]  [address]  [place] [address]    PATIENT  [name]  DOB: December 29, 1934, MRN: [id number]  ED Visit Date: Thu Dec 23, 2023 2133      ED Progress Note  12/23/23 @ 11:55 PM    89 yo f with lower abd pain, wbc 23.  H/o appendiceal ca, resection, sbo with resection, partial gastrectomy.  On zofran chronically from GI, doesn't take it due to s/e.  PCP saw her yesterday.  Pending ct abd/pelvis and ua    Last Vital Signs  Temp: 36.4 C (97.6 F) Oral  Pulse: 106  BP: (!) 153/87  Resp: 18  SpO2: 96 % Oxygen Therapy: None (Room air)    RESULTS  Labs Reviewed   COMPREHENSIVE METABOLIC PANEL - Abnormal       Result Value    Sodium 135      Potassium 4.1      Chloride 107      CO2 (Bicarbonate) 17 (*)     Anion Gap 11      BUN 36 (*)     Creatinine 0.88      eGFRcr 64      Glucose 160 (*)     Fasting? Unknown      Calcium 7.8 (*)     AST 59 (*)     ALT 45      Alkaline phosphatase 248 (*)     Protein, total 6.2      Albumin 2.9 (*)     Bilirubin, total 0.5     CBC WITH DIFFERENTIAL - Abnormal    WBC 23.4 (*)     RBC 4.13      Hemoglobin 11.6      Hematocrit 36.1      MCV 87.4      MCH 28.1      MCHC 32.1      RDW-CV 14.0      RDW-SD 45.1      Platelets 343      MPV 12.0      NRBC % 0.0      NRBC Absolute 0.00     URINALYSIS REFLEX TO CULTURE - Abnormal    Color, Ur Dark Yellow      Clarity, Ur Turbid (*)     Specific Gravity, Ur >=1.030      pH, Ur 5.0      Protein,Ur 100 (*)     Glucose,Ur Negative      Ketones, Ur Negative      Bilirubin, Ur Small (*)     Blood, Ur Large (*)     Urobilinogen, Ur 0.2      Nitrite, Ur Negative      Leukocyte Esterase, Ur Small (*)     RBC, Ur 673 (*)     WBC, Ur 22 (*)     Epithelial Cells, UR 15 (*)     Bacteria, Ur None Seen      Casts, Ur 4      Amorphous Crystal, Ur Present (*)    MANUAL DIFFERENTIAL - Abnormal    Neutrophils % 88      Lymphocytes % 5      Monocytes % 7      Neutrophils Absolute 20.59 (*)     Lymphocytes Absolute 1.17       Monocytes Absolute 1.64 (*)     RBC Morphology Normal      PLT Morphology Normal  Total WBC Counted 100     LIPASE - Normal    Lipase 17     URINE CULTURE   CBC W/DIFF    Narrative:     The following orders were created for panel order CBC and differential.  Procedure                               Abnormality         Status                     ---------                               -----------         ------                     CBC w/ Differential[[id number]]          Abnormal            Final result               Manual Differential[[id number]]          Abnormal            Final result                 Please view results for these tests on the individual orders.   URINE TESTING (UA, UARC, URINE CULTURE)    Narrative:     The following orders were created for panel order Urine Testing (UA, UARC, Urine Culture).  Procedure                               Abnormality         Status                     ---------                               -----------         ------                     Urinalysis reflex to cul.Aaron AasAaron Aas[[id number]]  Abnormal            Final result                 Please view results for these tests on the individual orders.      CT ABDOMEN PELVIS W CONTRAST   Final Result   Small bowel obstruction with a transition point in the region of an enteroenteric anastomosis. There is significant air and fluid distention of the stomach with partially imaged fluid filled distention of the distal esophagus.      Increased now moderate left hydronephrosis with a new large stone in the left renal pelvis which measures up to 1.7 cm. Multiple punctate stones within the dependent urinary bladder. Nonspecific mild-to-moderate distention of the urinary bladder.      Megan Davis 12/24/2023 1:48 AM          PROCEDURE(S)  Procedures    RESTRAINT(S)       ED COURSE/MEDICAL DECISION MAKING  ED Medication Administration from 12/23/2023 2133 to 12/24/2023 0406  Date/Time Order Dose Route Action Action by     12/23/2023  2249 EDT diphenhydrAMINE (BENADryl) injection 12.5 mg 12.5 mg intravenous Given Hamilton, K     12/23/2023 2249 EDT famotidine (Pepcid) injection 20 mg 20 mg intravenous Given Hamilton, K     12/23/2023 2249 EDT metoclopramide (Reglan) injection 10 mg 10 mg intravenous Given Hamilton, K     12/23/2023 2249 EDT sodium chloride 0.9 % bolus 500 mL 500 mL intravenous New Bag Hamilton, K     12/23/2023 2331 EDT sodium chloride 0.9 % bolus 500 mL 0 mL intravenous Stopped Perez-Delgado, V     12/24/2023 0000 EDT iohexol (OMNIPaque) 240 mg iodine/mL solution 50 mL 50 mL oral Given Perez-Delgado, V     12/24/2023 0117 EDT iohexol (OMNIPaque) 350 mg iodine/mL solution 100 mL 75 mL intravenous Given Armin Bers, J     12/24/2023 0235 EDT cefTRIAXone (Rocephin) vial 1 g 1 g intravenous Given Perez-Delgado, V     12/24/2023 0235 EDT vancomycin (Vancocin) 750 mg in sodium chloride 0.9 % 250 mL IVPB 750 mg intravenous New Bag Perez-Delgado, V     12/24/2023 0301 EDT morphine injection 4 mg 4 mg intravenous Given Perez-Delgado, V     12/24/2023 0401 EDT vancomycin (Vancocin) 750 mg in sodium chloride 0.9 % 250 mL IVPB 0 mg intravenous Stopped Perez-Delgado, V          Diagnoses as of 12/24/23 0605   Lower abdominal pain   Nausea   Chronic diarrhea   Small bowel obstruction (Multi-HCC)   Hydronephrosis with ureteropelvic junction (UPJ) obstruction   Calculus of renal pelvis   Bowel obstruction (Multi-HCC)     Medical Decision Making  Problems Addressed:  Bowel obstruction (Multi-HCC): complicated acute illness or injury  Calculus of renal pelvis: complicated acute illness or injury  Chronic diarrhea: complicated acute illness or injury  Hydronephrosis with ureteropelvic junction (UPJ) obstruction: complicated acute illness or injury  Lower abdominal pain: complicated acute illness or injury  Nausea: complicated acute illness or injury  Small bowel obstruction (Multi-HCC): complicated acute illness or injury    Amount and/or Complexity of  Data Reviewed  Independent Historian: guardian  Labs: ordered.  Radiology: ordered and independent interpretation performed. Decision-making details documented in ED Course.  ECG/medicine tests: ordered.  Discussion of management or test interpretation with external provider(s): Urology, general surgery    Risk  Prescription drug management.  Decision regarding hospitalization.        1:55 AM  Patient with SBO with transition point at the enteric junction, large amounts of stomach distention and fluid in the esophagus.  There is also a left pelvic stone that is new with hydronephrosis.  Patient thus far has been unable to urinate    2:27 AM  Spoke with Dr. Graceann Laurel from general surgery, recommends NG tube placement, has high risk bowel surgeries.  Will update patient and family.  Plan for admission to medicine, awaiting urinalysis and will likely need urology consult.    4:04 AM  Awaiting callback from urology, urinalysis without significant UTI.  Plan for admission to hospitalist service, discussed with admitting hospitalist who accepts.  Patient had NG tube placed, significant amount out, given morphine with improved symptoms    6:05 AM  I was able to speak with Dr. Webster Hal, will have someone from urology come see the patient today, consult placed     Cleave Curling, MD  12/24/23 [phone]

## 2023-12-23 NOTE — ED Provider Notes (Signed)
 [facility]  [address]  [place] [address]        HPI   Chief Complaint   Patient presents with    Abdominal Pain    Vomiting          History provided by:  Patient, relative and EMS personnel (daughter)  Vomiting  Severity:  Mild  Duration:  2 days  Timing:  Unable to specify  Quality:  Unable to specify  Chronicity:  Recurrent  Recent urination:  Decreased  Relieved by:  Nothing  Worsened by:  Antiemetics  Ineffective treatments:  Antiemetics  Associated symptoms: diarrhea    Associated symptoms: no abdominal pain, no chills, no cough, no fever, no sore throat and no URI    Risk factors: prior abdominal surgery    Risk factors: no alcohol use and no sick contacts        Patient History   Medical History[1]  Surgical History[2]  Family History[3]  Social History     Tobacco Use    Smoking status: Never     Passive exposure: Never    Smokeless tobacco: Never   Vaping Use    Vaping status: Never Used   Substance Use Topics    Alcohol use: Not Currently    Drug use: Never       Review of Systems   Review of Systems   Constitutional:  Positive for appetite change. Negative for chills and fever.   HENT:  Negative for congestion, facial swelling, sore throat and trouble swallowing.    Eyes: Negative.    Respiratory:  Negative for cough, chest tightness, shortness of breath and wheezing.    Cardiovascular:  Negative for chest pain and palpitations.   Gastrointestinal:  Positive for diarrhea, nausea and vomiting. Negative for abdominal pain and blood in stool.   Endocrine: Negative.    Genitourinary:  Negative for difficulty urinating, dysuria, flank pain and pelvic pain.   Musculoskeletal:  Negative for back pain, neck pain and neck stiffness.   Skin: Negative.    Allergic/Immunologic: Negative for immunocompromised state.   Neurological:  Positive for weakness. Negative for dizziness, facial asymmetry, speech difficulty and light-headedness.   Hematological: Negative.     Psychiatric/Behavioral:  The patient is not nervous/anxious.        Physical Exam   ED Triage Vitals [12/23/23 2145]   Temp Pulse Resp BP   36.4 C (97.6 F) 100 18 (!) 150/73      SpO2 Temp Source Heart Rate Source Patient Position   (!) 94 % Oral Monitor Semi-Fowler      BP Location FiO2 (%)     Left arm --       Physical Exam  Vitals and nursing note reviewed.   Constitutional:       Appearance: She is ill-appearing. She is not toxic-appearing or diaphoretic.   HENT:      Head: Normocephalic.      Mouth/Throat:      Mouth: Mucous membranes are moist.   Eyes:      General: Vision grossly intact. No scleral icterus.     Extraocular Movements: Extraocular movements intact.      Conjunctiva/sclera: Conjunctivae normal.   Cardiovascular:      Rate and Rhythm: Regular rhythm. Tachycardia present.      Heart sounds: Normal heart sounds. No murmur heard.  Pulmonary:      Effort: Pulmonary effort is normal. No respiratory distress.      Breath sounds:  Normal breath sounds.   Abdominal:      General: Abdomen is protuberant. Bowel sounds are increased.      Palpations: Abdomen is soft.      Tenderness: There is abdominal tenderness in the suprapubic area and left lower quadrant. There is no right CVA tenderness, left CVA tenderness or guarding.   Musculoskeletal:      Right lower leg: No swelling or tenderness.      Left lower leg: No swelling or tenderness.   Skin:     General: Skin is warm and dry.      Capillary Refill: Capillary refill takes less than 2 seconds.      Coloration: Skin is pale. Skin is not jaundiced.      Findings: No rash.   Neurological:      General: No focal deficit present.      Mental Status: She is alert. Mental status is at baseline.      GCS: GCS eye subscore is 4. GCS verbal subscore is 5. GCS motor subscore is 6.      Cranial Nerves: Cranial nerves 2-12 are intact. No cranial nerve deficit or facial asymmetry.      Sensory: Sensation is intact.      Motor: Motor function is intact.       Coordination: Coordination is intact.   Psychiatric:         Mood and Affect: Mood is not anxious.       No orders to display       Labs Reviewed   COMPREHENSIVE METABOLIC PANEL - Abnormal       Result Value    Sodium 135      Potassium 4.1      Chloride 107      CO2 (Bicarbonate) 17 (*)     Anion Gap 11      BUN 36 (*)     Creatinine 0.88      eGFRcr 64      Glucose 160 (*)     Fasting? Unknown      Calcium 7.8 (*)     AST 59 (*)     ALT 45      Alkaline phosphatase 248 (*)     Protein, total 6.2      Albumin 2.9 (*)     Bilirubin, total 0.5     CBC WITH DIFFERENTIAL - Abnormal    WBC 23.4 (*)     RBC 4.13      Hemoglobin 11.6      Hematocrit 36.1      MCV 87.4      MCH 28.1      MCHC 32.1      RDW-CV 14.0      RDW-SD 45.1      Platelets 343      MPV 12.0      NRBC % 0.0      NRBC Absolute 0.00     MANUAL DIFFERENTIAL - Abnormal    Neutrophils % 88      Lymphocytes % 5      Monocytes % 7      Neutrophils Absolute 20.59 (*)     Lymphocytes Absolute 1.17      Monocytes Absolute 1.64 (*)     RBC Morphology Normal      PLT Morphology Normal      Total WBC Counted 100     LIPASE - Normal    Lipase 17     CBC W/DIFF  Narrative:     The following orders were created for panel order CBC and differential.  Procedure                               Abnormality         Status                     ---------                               -----------         ------                     CBC w/ Differential[[id number]]          Abnormal            Final result               Manual Differential[[id number]]          Abnormal            Final result                 Please view results for these tests on the individual orders.   URINE TESTING (UA, UARC, URINE CULTURE)    Narrative:     The following orders were created for panel order Urine Testing (UA, UARC, Urine Culture).  Procedure                               Abnormality         Status                     ---------                               -----------         ------                      Urinalysis reflex to cul.Aaron AasAaron Aas[[id number]]                                                   Please view results for these tests on the individual orders.   URINALYSIS REFLEX TO CULTURE       Glasgow Coma Scale Score: 15      ED Course & MDM   ED Course as of 12/24/23 0000   Thu Dec 23, 2023   2246 Ekg nsr at 100 nscpt c/w 12/12/22   2351 Signed out to Dr Richardo Chandler awaiting u/a + abdominal ct scan         Diagnoses as of 12/24/23 0000   Lower abdominal pain   Nausea   Chronic diarrhea       Medical Decision Making  Problems Addressed:  Chronic diarrhea: chronic illness or injury  Lower abdominal pain: complicated acute illness or injury  Nausea: complicated acute illness or injury    Amount and/or Complexity of Data Reviewed  External Data Reviewed: labs, radiology, ECG and notes.  Labs: ordered. Decision-making details documented in ED Course.  Radiology: ordered.  ECG/medicine tests: ordered and independent interpretation performed. Decision-making details documented in ED Course.    Risk  Prescription drug management.                            [1]   Past Medical History:  Diagnosis Date    Colon cancer (Multi-HCC)     Depression      Duodenal ulcer     GERD (gastroesophageal reflux disease)     Hx of carcinoid syndrome     Hypothyroid      Insomnia     Iron deficiency anemia     Neuropathy     Peptic ulcer disease     Skin cancer, basal cell    [2]   Past Surgical History:  Procedure Laterality Date    APPENDECTOMY      COLONOSCOPY      ESOPHAGOGASTRODUODENOSCOPY  11/24/2019    ESOPHAGOGASTRODUODENOSCOPY  04/05/2020    EXPLORATORY LAPAROTOMY  03/12/2020    with partial gastrectomy and redo of Billroth II    EXPLORATORY LAPAROTOMY  01/2019    with Tyrone Gallop patch repair of duodenal ulcer and appendectomy    LYSIS OF ADHESIONS  02/20/2020    with Billroth II    RIGHT COLECTOMY  01/2019   [3] No family history on file.       Jone Neither, MD  12/24/23 0000

## 2023-12-23 NOTE — ED Triage Notes (Signed)
 BIBA from home with 9/10 lower abdominal pain and vomiting x2 days. Last bowel movement Tuesday night. Pt had appendix and portion of colon removed in 2018.

## 2023-12-24 ENCOUNTER — Inpatient Hospital Stay
Admission: EM | Admit: 2023-12-24 | Discharge: 2023-12-29 | Disposition: A | Discharge status: Home / Self Care | Payer: MEDICARE | Admitting: Internal Medicine

## 2023-12-24 ENCOUNTER — Emergency Department: Admit: 2023-12-24 | Payer: MEDICARE | Primary: Family Medicine

## 2023-12-24 ENCOUNTER — Inpatient Hospital Stay: Admit: 2023-12-24 | Payer: MEDICARE | Primary: Family Medicine

## 2023-12-24 DIAGNOSIS — K56609 Unspecified intestinal obstruction, unspecified as to partial versus complete obstruction: Secondary | ICD-10-CM

## 2023-12-24 LAB — URINALYSIS REFLEX TO CULTURE
Bacteria, Ur: NONE SEEN
Casts, Ur: 4 /LPF (ref 0–4)
Epithelial Cells, UR: 15 {cells}/[HPF] — ABNORMAL HIGH (ref 0–5)
Glucose,Ur: NEGATIVE mg/dL
Ketones, Ur: NEGATIVE mg/dL
Nitrite, Ur: NEGATIVE
Protein,Ur: 100 mg/dL — AB
RBC, Ur: 673 {cells}/[HPF] — ABNORMAL HIGH (ref 0–4)
Specific Gravity, Ur: >=1.03 (ref 1.005–1.030)
Urobilinogen, Ur: 0.2 U/dL (ref 0.2–1.0)
WBC, Ur: 22 {cells}/[HPF] — ABNORMAL HIGH (ref 0–5)
pH, Ur: 5 (ref 5.0–8.0)

## 2023-12-24 LAB — COMPREHENSIVE METABOLIC PANEL
ALT: 45 U/L (ref 0–55)
AST: 59 U/L — ABNORMAL HIGH (ref 6–42)
Albumin: 2.9 g/dL — ABNORMAL LOW (ref 3.2–5.0)
Alkaline phosphatase: 248 U/L — ABNORMAL HIGH (ref 30–130)
Anion Gap: 11 mmol/L (ref 3–14)
BUN: 36 mg/dL — ABNORMAL HIGH (ref 6–24)
Bilirubin, total: 0.5 mg/dL (ref 0.2–1.2)
CO2 (Bicarbonate): 17 mmol/L — ABNORMAL LOW (ref 20–32)
Calcium: 7.8 mg/dL — ABNORMAL LOW (ref 8.5–10.5)
Chloride: 107 mmol/L (ref 98–110)
Creatinine: 0.88 mg/dL (ref 0.55–1.30)
Glucose: 160 mg/dL — ABNORMAL HIGH (ref 70–110)
Potassium: 4.1 mmol/L (ref 3.6–5.2)
Protein, total: 6.2 g/dL (ref 6.0–8.4)
Sodium: 135 mmol/L (ref 135–146)
eGFRcr: 64 mL/min/{1.73_m2} (ref >=60)

## 2023-12-24 LAB — CBC WITH DIFFERENTIAL
Hematocrit: 36.1 % (ref 32.0–47.0)
Hemoglobin: 11.6 g/dL (ref 11.0–16.0)
MCH: 28.1 pg (ref 26.0–34.0)
MCHC: 32.1 g/dL (ref 31.0–37.0)
MCV: 87.4 fL (ref 80.0–100.0)
MPV: 12 fL (ref 9.1–12.4)
NRBC %: 0 % (ref 0.0–0.0)
NRBC Absolute: 0 10*3/uL (ref 0.00–2.00)
Platelets: 343 10*3/uL (ref 150–400)
RBC: 4.13 M/uL (ref 3.70–5.20)
RDW-CV: 14 % (ref 11.5–14.5)
RDW-SD: 45.1 fL (ref 35.0–51.0)
WBC: 23.4 10*3/uL — ABNORMAL HIGH (ref 4.0–11.0)

## 2023-12-24 LAB — MANUAL DIFFERENTIAL
Lymphocytes %: 5 %
Lymphocytes Absolute: 1.17 10*3/uL (ref 0.70–4.00)
Monocytes %: 7 %
Monocytes Absolute: 1.64 10*3/uL — ABNORMAL HIGH (ref 0.36–0.77)
Neutrophils %: 88 %
Neutrophils Absolute: 20.59 10*3/uL — ABNORMAL HIGH (ref 1.50–7.95)
PLT Morphology: NORMAL
RBC Morphology: NORMAL
Total WBC Counted: 100

## 2023-12-24 LAB — LIPASE: Lipase: 17 U/L (ref 13–75)

## 2023-12-24 LAB — RAINBOW DRAW SST GOLD TOP

## 2023-12-24 LAB — LIGHT BLUE TOP

## 2023-12-24 MED ORDER — morphine injection 4 mg
4 | Freq: Once | INTRAVENOUS | Status: AC
Start: 2023-12-24 — End: 2023-12-24
  Administered 2023-12-24: 07:00:00 4 mg via INTRAVENOUS

## 2023-12-24 MED ORDER — cefTRIAXone (Rocephin) 1 g in sodium chloride 0.9 % 100 mL IVPB-ADD EASE
0.9 | Freq: Once | INTRAVENOUS | Status: DC
Start: 2023-12-24 — End: 2023-12-24

## 2023-12-24 MED ORDER — heparin (porcine) injection 5,000 Units
5000 | Freq: Three times a day (TID) | INTRAMUSCULAR | Status: DC
Start: 2023-12-24 — End: 2023-12-24

## 2023-12-24 MED ORDER — heparin (porcine) injection 5,000 Units
5000 | Freq: Two times a day (BID) | INTRAMUSCULAR | Status: DC
Start: 2023-12-24 — End: 2023-12-29
  Administered 2023-12-24 – 2023-12-29 (×11): 5000 [IU] via SUBCUTANEOUS

## 2023-12-24 MED ORDER — vancomycin (Vancocin) 750 mg in sodium chloride 0.9 % 250 mL IVPB
750 | Freq: Once | INTRAVENOUS | Status: AC
Start: 2023-12-24 — End: 2023-12-24
  Administered 2023-12-24: 07:00:00 750 mg/kg via INTRAVENOUS

## 2023-12-24 MED ORDER — pantoprazole (ProtoNix) injection 40 mg
40 | Freq: Every day | INTRAVENOUS | Status: DC
Start: 2023-12-24 — End: 2023-12-27
  Administered 2023-12-24 – 2023-12-26 (×3): 40 mg via INTRAVENOUS

## 2023-12-24 MED ORDER — iohexol (OMNIPaque) 350 mg iodine/mL solution 100 mL
350 | Freq: Once | INTRAVENOUS | Status: AC
Start: 2023-12-24 — End: 2023-12-24
  Administered 2023-12-24: 05:00:00 75 mL via INTRAVENOUS

## 2023-12-24 MED ORDER — cefTRIAXone (Rocephin) 1 g in sodium chloride 0.9 % 100 mL IVPB-MBP
1 | INTRAVENOUS | Status: DC
Start: 2023-12-24 — End: 2023-12-27
  Administered 2023-12-25 – 2023-12-27 (×3): 1 g via INTRAVENOUS

## 2023-12-24 MED ORDER — sodium chloride 0.9 % infusion
0.9 | INTRAVENOUS | Status: AC
Start: 2023-12-24 — End: 2023-12-26
  Administered 2023-12-25 (×2): 100 mL/h via INTRAVENOUS

## 2023-12-24 MED ORDER — ondansetron (Zofran) injection 4 mg
4 | Freq: Four times a day (QID) | INTRAMUSCULAR | Status: DC | PRN
Start: 2023-12-24 — End: 2023-12-29
  Administered 2023-12-26 – 2023-12-28 (×4): 4 mg via INTRAVENOUS

## 2023-12-24 MED ORDER — morphine injection 4 mg
4 | INTRAVENOUS | Status: DC | PRN
Start: 2023-12-24 — End: 2023-12-25
  Administered 2023-12-25 (×2): 4 mg via INTRAVENOUS

## 2023-12-24 MED ORDER — cefTRIAXone (Rocephin) vial 1 g
1 | Freq: Once | INTRAMUSCULAR | Status: AC
Start: 2023-12-24 — End: 2023-12-24
  Administered 2023-12-24: 07:00:00 1 g via INTRAVENOUS

## 2023-12-24 MED FILL — FAMOTIDINE (PF) 20 MG/2 ML INTRAVENOUS SOLUTION: 20 20 mg/2 mL | INTRAVENOUS | Qty: 2 | Fill #0

## 2023-12-24 MED FILL — CEFTRIAXONE 1 GRAM SOLUTION FOR INJECTION: 1 1 gram | INTRAMUSCULAR | Qty: 1 | Fill #0

## 2023-12-24 MED FILL — IOHEXOL 240 MG IODINE/ML INTRAVENOUS SOLUTION: 240 240 mg iodine/mL | INTRAVENOUS | Qty: 50 | Fill #0

## 2023-12-24 MED FILL — METOCLOPRAMIDE 5 MG/ML INJECTION SOLUTION: 5 5 mg/mL | INTRAMUSCULAR | Qty: 2 | Fill #0

## 2023-12-24 MED FILL — DIPHENHYDRAMINE 50 MG/ML INJECTION SOLUTION: 50 50 mg/mL | INTRAMUSCULAR | Qty: 1 | Fill #0

## 2023-12-24 MED FILL — MORPHINE 4 MG/ML INTRAVENOUS SOLUTION WRAPPER: 4 4 mg/mL | INTRAVENOUS | Qty: 1 | Fill #0

## 2023-12-24 MED FILL — VANCOMYCIN IVPB 750 MG ADD-EASE/VIALMATE: 20.0000 20.0000 mg/kg | Qty: 750 | Fill #0

## 2023-12-24 NOTE — Consults (Signed)
 [facility] GENERAL SURGERY      PATIENTS NAME & MRN: [name], MRN: [id number]  DOB: 07-14-1935 (89 y.o. female)   ADMITTED ON: 12/23/2023  9:36 PM (0days)  LOCATION, DATE & TIME: [facility] Pool Room/OTF, 12/24/2023 at 8:36 AM  CONSULTANT: Boyce Byes, MD      Chief Complaint/Consult: SBO    History of Present Illness:  [name] is a 89 y.o. 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  She also has a history of a right hemicolectomy with ilio colonic anastomosis.  The patient states that she has been in usual health over the past several years.  She works as a Conservation officer, nature at [employer].  Over the past week she has felt unwell and has been nauseous and vomiting more than usual.  This brought her to see her PCP.  She was then sent for labs, she continued to feel unwell and her family prompted her to go to the ER.  While in the ER she was able to have a liquid bowel movement.  They placed an NG tube which removed a significant amount of fluid from her stomach, reported 1900 cc.      Review of Systems:  Review of Systems   Constitutional:  Negative for chills, fever, malaise/fatigue and weight loss.   HENT:  Negative for sore throat.    Eyes: Negative.    Respiratory: Negative.  Negative for shortness of breath.    Cardiovascular:  Negative for chest pain.   Gastrointestinal:  Positive for abdominal pain, diarrhea and nausea.   Genitourinary:  Positive for flank pain.   Skin: Negative.    Neurological: Negative.    Endo/Heme/Allergies: Negative.    Psychiatric/Behavioral: Negative.         Problem List[1]    Medical History[2]    Surgical History[3]    Medications Ordered Prior to Encounter[4]    Social History     Occupational History    Not on file   Tobacco Use    Smoking status: Never     Passive exposure: Never    Smokeless tobacco: Never    Vaping Use    Vaping status: Never Used   Substance and Sexual Activity    Alcohol use: Not Currently    Drug use: Never    Sexual activity: Defer         Physical Exam:  VS: BP 129/83 (BP Location: Right arm, Patient Position: Lying)   Pulse 102   Temp 36.4 C (97.6 F) (Oral)   Resp 18   Ht 1.6 m   Wt 39.8 kg   SpO2 99%   BMI 15.54 kg/m     Physical Exam    I&O Flowsheet data  I/O last 3 completed shifts:  In: 750 (18.9 mL/kg) [IV Piggyback:750]  Out: 500 (12.6 mL/kg) [Urine:500 (0.3 mL/kg/hr)]  Weight: 39.8 kg       Recent labs  Results from last 7 days   Lab Units 12/23/23  2255   HEMATOCRIT % 36.1   HEMOGLOBIN g/dL 62.9   WBC AUTO K/uL 52.8*   PLATELETS AUTO K/uL 343      Results from last 7 days   Lab Units 12/23/23  2157   GLUCOSE mg/dL 413*   CALCIUM mg/dL 7.8*   SODIUM mmol/L 244   POTASSIUM mmol/L 4.1  CO2 mmol/L 17*   CHLORIDE mmol/L 107   BUN mg/dL 36*   CREATININE mg/dL 0.45           Results from last 7 days   Lab Units 12/23/23  2157   ALT U/L 45   AST U/L 59*   ALK PHOS U/L 248*   BILIRUBIN TOTAL mg/dL 0.5            Assessment/Plan:   [name] is a(n) 89 y.o. female who presented with Bowel obstruction (Multi-HCC), she was also found to have a large kidney stone being evaluated by urology.    Small bowel obstruction  -Continue nasogastric decompression for 24 hours  -Monitor patient's bowel function, monitor for flatus or stool  -Consider removal of nasogastric tube and advancement to a clear liquid diet if passes flatus or stool and nasogastric output decreases          Boyce Byes, MD  General Surgery  [facility]   General Surgery         [1]   Patient Active Problem List  Diagnosis Code    Hx of carcinoid syndrome Z86.39    Hypothyroid  E03.9    Insomnia G47.00    Iron deficiency anemia D50.9    Neuropathy G62.9    Peptic ulcer disease K27.9    Skin cancer, basal cell C44.91    Acquired short bowel syndrome  K90.829    At risk for falls Z91.81    Basal cell  carcinoma (BCC) of abdomen C44.519    Gastrointestinal hypomotility K31.89    History of partial gastrectomy Z90.3    Incontinence of feces R15.9    Postural syncope R55    Chest pain R07.9    Hypocalcemia E83.51    Nausea R11.0    Flatulence, eructation, and gas pain R14.3, R14.1, R14.2    Bowel obstruction (Multi-HCC) K56.609   [2]   Past Medical History:  Diagnosis Date    Colon cancer (Multi-HCC)     Depression      Duodenal ulcer     GERD (gastroesophageal reflux disease)     Hx of carcinoid syndrome     Hypothyroid      Insomnia     Iron deficiency anemia     Neuropathy     Peptic ulcer disease     Skin cancer, basal cell    [3]   Past Surgical History:  Procedure Laterality Date    APPENDECTOMY      COLONOSCOPY      ESOPHAGOGASTRODUODENOSCOPY  11/24/2019    ESOPHAGOGASTRODUODENOSCOPY  04/05/2020    EXPLORATORY LAPAROTOMY  03/12/2020    with partial gastrectomy and redo of Billroth II    EXPLORATORY LAPAROTOMY  01/2019    with Tyrone Gallop patch repair of duodenal ulcer and appendectomy    LYSIS OF ADHESIONS  02/20/2020    with Billroth II    RIGHT COLECTOMY  01/2019   [4]   No current facility-administered medications on file prior to encounter.     Current Outpatient Medications on File Prior to Encounter   Medication Sig Dispense Refill    amitriptyline (Elavil) 50 mg tablet Take 1 tablet (50 mg) by mouth at bedtime. 30 tablet 6    levothyroxine (Synthroid, Levoxyl) 112 mcg tablet Take 1 tablet (112 mcg) by mouth once daily. 90 tablet 1    mirtazapine (Remeron) 15 mg tablet TAKE 1 TABLET BY MOUTH EVERYDAY AT BEDTIME 90 tablet 1    Motegrity 1  mg tablet Take 1 tablet (1 mg) by mouth once daily. 90 tablet 1    multivitamin tablet Take 1 tablet by mouth in the morning.      pantoprazole (ProtoNix) 40 mg EC tablet TAKE 1 TABLET (40 MG) BY MOUTH TWICE DAILY. DO NOT CRUSH, CHEW, OR SPLIT. 180 tablet 1    pregabalin (Lyrica) 50 mg capsule Take 1 capsule (50 mg) by mouth once daily. 90 capsule 1    [DISCONTINUED]  levothyroxine (Synthroid, Levoxyl) 100 mcg tablet Take 1 tablet (100 mcg) by mouth once daily. 90 tablet 0    [DISCONTINUED] ondansetron (Zofran) 4 mg tablet Take 1 tablet (4 mg) by mouth every 8 (eight) hours if needed for nausea or vomiting. 21 tablet 1

## 2023-12-24 NOTE — Consults (Signed)
 Urology Consult Note  Patient Name: [name]   Date of Birth: 1934-12-12   Admission Date: 12/23/2023  9:36 PM   Today's Date: 12/24/2023     SUBJECTIVE:  [name] 89 y.o. female with a past medical history of perforated duodenal ulcer, s/p appendectomy with pathology showing carcinoid tumor, s/p right hemicolectomy with ileocolic anastomosis, s/p antrectomy with gastrojejunostomy, gastric outlet obstruction, depression, anxiety, hypothyroidism, who was admitted with small bowel obstruction. Urology consulted for large left renal pelvic stone on CT. She presented to the ER for abdominal pain with nausea. Patient states that she had a surgery on her left kidney when she was 89 year old. She has had some left sided flank pain which has improved since he presented to the ER. While in the ER they were asking her to void for a urine sample but was unable to and she was straight catheterized for . She denies difficulty voiding at baseline. Denies recent dysuria or hematuria. 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 She has not voided since she was straight catheterized early this morning at 2am.     History:  Medical History[1]  Surgical History[2]  Family History[3]    Current Medications:   Medications        Start Medication Dose/Rate, Route, Frequency Ordered Stop    12/24/23 1400 heparin (porcine) injection 5,000 Units         5,000 Units, subQ, Every 8 hours scheduled 12/24/23 0959 --    12/24/23 1015 pantoprazole (ProtoNix) injection 40 mg         40 mg, IV, Daily 12/24/23 1000 --    12/24/23 1015 cefTRIAXone (Rocephin) 1 g in sodium chloride 0.9 % 100 mL IVPB-MBP         1 g, IV, Every 24 hours 12/24/23 1001 12/29/23 1014    12/24/23 0959 ondansetron (Zofran) injection 4 mg         4 mg, IV, Every 6 hours PRN 12/24/23 0959 --    12/24/23 0405 sodium chloride 0.9 % infusion         100 mL/hr, IV, Continuous 12/24/23 0401 12/25/23 0404    12/24/23  0400 morphine injection 4 mg         4 mg, IV, Every 4 hours PRN 12/24/23 0400 12/27/23 0359                  OBJECTIVE  Visit Vitals  BP 129/83 (BP Location: Right arm, Patient Position: Lying)   Pulse 102   Temp 36.4 C (97.6 F) (Oral)   Resp 18      Intake/Output:     Intake/Output Summary (Last 24 hours) at 12/24/2023 1003  Last data filed at 12/24/2023 1610  Gross per 24 hour   Intake 750 ml   Output 2400 ml   Net -1650 ml     Physical Exam:  General- No acute distress, alert & oriented x3  ENT- Mucous membranes moist  Lungs- Normal effort, no audible wheezing  Extremities- No edema in bilateral lower extremities  Skin- Warm and dry  Abdomen- Nondistended, soft, mild suprapubic tenderness to palpation, mild left CVA tenderness      Lab Results   Component Value Date    WBC 23.4 (H) 12/23/2023    HGB 11.6 12/23/2023    HCT 36.1 12/23/2023    MCV 87.4 12/23/2023    PLT 343 12/23/2023  Lab Results   Component Value Date    GLUCOSE 160 (H) 12/23/2023    CALCIUM 7.8 (L) 12/23/2023    NA 135 12/23/2023    K 4.1 12/23/2023    CO2 17 (L) 12/23/2023    CL 107 12/23/2023    BUN 36 (H) 12/23/2023    CREATININE 0.88 12/23/2023      Microbiology:     Results       Procedure Component Value Units Date/Time    Urine culture [[id number]] Collected: 12/24/23 0215    Order Status: Sent Specimen: Urine, Clean Catch Updated: 12/24/23 0304           Radiology:  Procedure: CT ABDOMEN PELVIS W CONTRAST  12/24/2023 1:17 AM  Indications: abdominal distension, nausea chronic diarrhea Lower abdominal pain, unspecified, Nausea, Noninfective gastroenteritis and colitis, unspecified .  Comparison: July 02, 2020.    TECHNIQUE: Multiplanar CT imaging of the abdomen and pelvis after intravenous and oral contrast. Dose modulation, automated exposure control, and/or iterative reconstruction technique used for dose reduction.    FINDINGS:    Visualized Lower Chest: Atelectasis and scarring in the lung bases.    Liver: Hepatic  steatosis.    Biliary Tree: No biliary ductal dilatation.  Cholelithiasis. Increased nonspecific distention of the gallbladder.    Spleen: Normal.    Adrenals: Normal.    Pancreas: Atrophic. Otherwise unremarkable.    Kidneys, Ureters and Bladder: Increased now moderate left hydronephrosis with a new large stone in the left renal pelvis which measures up to 1.7 cm. No right-sided hydronephrosis. Multiple small hypodensities in the kidneys which are too small to characterize but likely represent cysts. Nonspecific mild to moderate distention of the urinary bladder. Punctate stones in the posterior left urinary bladder.    Bowel: Redemonstration of postsurgical changes from prior gastric/bowel surgery. There is significantly increased air and fluid distention of the stomach with a new patulous fluid-filled distal esophagus. The gastric duodenal loop is dilated with an air-fluid level. The transition point is at the enteroenteric anastomosis. Beyond the anastomosis there are some mildly dilated air and fluid loops of small bowel with gradual return to normal caliber. Colonic diverticulosis without CT evidence of diverticulitis.    Vascular: Severe vascular calcification. No aortic aneurysm or dissection.    Remainder of Exam:  No adenopathy.  No free fluid or well formed fluid collections.  No free air.  Calcified uterine fibroids. The uterus is otherwise grossly unremarkable. The adnexa are grossly unremarkable.    Musculoskeletal: Bony demineralization. Mild degenerative changes and scoliosis. No acute osseous findings.   Impression:     Small bowel obstruction with a transition point in the region of an enteroenteric anastomosis. There is significant air and fluid distention of the stomach with partially imaged fluid filled distention of the distal esophagus.    Increased now moderate left hydronephrosis with a new large stone in the left renal pelvis which measures up to 1.7 cm. Multiple punctate stones within the  dependent urinary bladder. Nonspecific mild-to-moderate distention of the urinary bladder.    [name] 12/24/2023 1:48 AM     Assessment:     89 y.o. 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    Plan:    Her  left hydronephrosis appears chronic and was seen on CT obtained in 2021, her renal function is stable. Her left renal stone is new but is not causing acute obstruction. Follow up urine culture, has leukocytosis but UA has typical appearance for known renal stone. If she acutely decompensates and is unstable, could consider IR left PCN placement however at this time she is afebrile and her blood pressures are normal. Will need outpatient follow up with urology for discussion of management of left renal stone, no acute intervention at this time.     Matheo Rathbone, PA-C  Urology         [1]   Past Medical History:  Diagnosis Date    Colon cancer (Multi-HCC)     Depression      Duodenal ulcer     GERD (gastroesophageal reflux disease)     Hx of carcinoid syndrome     Hypothyroid      Insomnia     Iron deficiency anemia     Neuropathy     Peptic ulcer disease     Skin cancer, basal cell    [2]   Past Surgical History:  Procedure Laterality Date    APPENDECTOMY      COLONOSCOPY      ESOPHAGOGASTRODUODENOSCOPY  11/24/2019    ESOPHAGOGASTRODUODENOSCOPY  04/05/2020    EXPLORATORY LAPAROTOMY  03/12/2020    with partial gastrectomy and redo of Billroth II    EXPLORATORY LAPAROTOMY  01/2019    with Tyrone Gallop patch repair of duodenal ulcer and appendectomy    LYSIS OF ADHESIONS  02/20/2020    with Billroth II    RIGHT COLECTOMY  01/2019   [3] No family history on file.

## 2023-12-24 NOTE — H&P (Signed)
 HOSPITALIST H&P/ADMISSION NOTE      12/24/2023    Patient: [name]  DOB: Oct 19, 1934  PCP: Milagros Alf, MD    SOURCE OF INFORMATION: Review of available medical records from Electronic Medical Record, Emergency department provider sign out. Bedside encounter with the patient and/or family    CHIEF COMPLAINT:       Abdominal pain    HISTORY OF PRESENT ILLNESS:     89 year old female H/o appendiceal ca, resection, sbo with resection, partial gastrectomy.  Presented with lower abdominal pain.  Patient complaining abdomen bloating and discomfort for 1 week and getting worse, accompanied with nausea.  She had chronic diarrhea which she passed some stool last couple days.  Denies chest pain or shortness of breath.  Denies fever.   Denies dysuria.  At ED:   CT abdomen:  Small bowel obstruction with a transition point in the region of an enteroenteric anastomosis. There is significant air and fluid distention of the stomach with partially imaged fluid filled distention of the distal esophagus.   Increased now moderate left hydronephrosis with a new large stone in the left renal pelvis which measures up to 1.7 cm. Multiple punctate stones within the dependent urinary bladder. Nonspecific mild-to-moderate distention of the urinary bladder.   Surgery was consulted and suggested NG tube, patient currently feels abdominal pain improving with NG.  She is concerned that her NG tube was pulled out a little bit during transportation to main campus  Admit for further management    REVIEW OF SYSTEMS:     12 ROS checked and negative otherwise than what is mentioned in the HPI    MEDICAL & SURGICAL HX:      Medical History[1]  Problem List[2]  Surgical History[3]    FAMILY HX:     Reviewed and noncontributory other than:    Family History[4]    SOCIAL HX:     Social History     Socioeconomic History    Marital status: Widowed     Spouse name: Not on file    Number of children: Not on file    Years of education: Not on file     Highest education level: Not on file   Occupational History    Not on file   Tobacco Use    Smoking status: Never     Passive exposure: Never    Smokeless tobacco: Never   Vaping Use    Vaping status: Never Used   Substance and Sexual Activity    Alcohol use: Not Currently    Drug use: Never    Sexual activity: Defer   Other Topics Concern    Not on file   Social History Narrative    Not on file     Social Determinants of Health     Financial Resource Strain: Not on file   Food Insecurity: Not on file   Transportation Needs: Not on file   Physical Activity: Not on file   Stress: Not on file   Social Connections: Not on file   Intimate Partner Violence: Not on file   Housing Stability: Not on file       ALLERGIES:      Allergies[5]      PRIOR TO ADMISSION MEDICATIONS:      Prior to Admission medications   Medication Sig Start Date End Date Taking? Authorizing Provider   amitriptyline (Elavil) 50 mg tablet Take 1 tablet (50 mg) by mouth at bedtime. 11/11/23   Donavon Fudge  Valente Gaskin, MD   levothyroxine (Synthroid, Levoxyl) 112 mcg tablet Take 1 tablet (112 mcg) by mouth once daily. 08/09/23 02/05/24  Milagros Alf, MD   mirtazapine (Remeron) 15 mg tablet TAKE 1 TABLET BY MOUTH EVERYDAY AT BEDTIME 10/21/23   Milagros Alf, MD   Motegrity 1 mg tablet Take 1 tablet (1 mg) by mouth once daily. 10/26/23   Milagros Alf, MD   multivitamin tablet Take 1 tablet by mouth in the morning.    Nurse [facility], RN   pantoprazole (ProtoNix) 40 mg EC tablet TAKE 1 TABLET (40 MG) BY MOUTH TWICE DAILY. DO NOT CRUSH, CHEW, OR SPLIT. 08/04/23   Milagros Alf, MD   pregabalin (Lyrica) 50 mg capsule Take 1 capsule (50 mg) by mouth once daily. 09/16/23 03/14/24  Milagros Alf, MD   levothyroxine (Synthroid, Levoxyl) 100 mcg tablet Take 1 tablet (100 mcg) by mouth once daily. 06/30/23 12/22/23  Milagros Alf, MD   ondansetron (Zofran) 4 mg tablet Take 1 tablet (4 mg) by mouth every 8 (eight) hours if needed for nausea or vomiting. 12/03/23  12/22/23  Milagros Alf, MD        PHYSICAL EXAMINATION:     Temp:  [36.4 C (97.6 F)] 36.4 C (97.6 F)  Pulse:  [100-106] 102  Resp:  [18] 18  SpO2:  [94 %-99 %] 99 %  BP: (129-153)/(73-87) 129/83    Respiratory:  Lungs are clear to auscultation, Breath sounds are equal.  Cardiovascular:  Regular rate and rhythm.   Gastrointestinal:  Soft, mild tenderness at the lower abdomen  Neurologic:  awake, alert, no focal neurodeficit      Psychiatric:  Cooperative      Integumentary: Skin intact, warm      LABORATORY DATA/IMAGING      Results for orders placed or performed during the hospital encounter of 12/23/23   Comprehensive metabolic panel    Collection Time: 12/23/23  9:57 PM   Result Value Ref Range    Sodium 135 135 - 146 mmol/L    Potassium 4.1 3.6 - 5.2 mmol/L    Chloride 107 98 - 110 mmol/L    CO2 (Bicarbonate) 17 (L) 20 - 32 mmol/L    Anion Gap 11 3 - 14 mmol/L    BUN 36 (H) 6 - 24 mg/dL    Creatinine 5.28 4.13 - 1.30 mg/dL    eGFRcr 64 >=24 MW/NUU/7.25D*6    Glucose 160 (H) 70 - 110 mg/dL    Fasting? Unknown     Calcium 7.8 (L) 8.5 - 10.5 mg/dL    AST 59 (H) 6 - 42 U/L    ALT 45 0 - 55 U/L    Alkaline phosphatase 248 (H) 30 - 130 U/L    Protein, total 6.2 6.0 - 8.4 g/dL    Albumin 2.9 (L) 3.2 - 5.0 g/dL    Bilirubin, total 0.5 0.2 - 1.2 mg/dL   Lipase    Collection Time: 12/23/23  9:57 PM   Result Value Ref Range    Lipase 17 13 - 75 U/L   Light Blue Top    Collection Time: 12/23/23  9:57 PM   Result Value Ref Range    Extra Tube Hold for add-ons.    SST Gold Top    Collection Time: 12/23/23  9:57 PM   Result Value Ref Range    Extra Tube Hold for add-ons.    CBC w/ Differential    Collection Time:  12/23/23 10:55 PM   Result Value Ref Range    WBC 23.4 (H) 4.0 - 11.0 K/uL    RBC 4.13 3.70 - 5.20 M/uL    Hemoglobin 11.6 11.0 - 16.0 g/dL    Hematocrit 47.8 29.5 - 47.0 %    MCV 87.4 80.0 - 100.0 fL    MCH 28.1 26.0 - 34.0 pg    MCHC 32.1 31.0 - 37.0 g/dL    RDW-CV 62.1 30.8 - 65.7 %    RDW-SD 45.1 35.0 - 51.0  fL    Platelets 343 150 - 400 K/uL    MPV 12.0 9.1 - 12.4 fL    NRBC % 0.0 0.0 - 0.0 %    NRBC Absolute 0.00 0.00 - 2.00 K/uL   Manual Differential    Collection Time: 12/23/23 10:55 PM   Result Value Ref Range    Neutrophils % 88 %    Lymphocytes % 5 %    Monocytes % 7 %    Neutrophils Absolute 20.59 (H) 1.50 - 7.95 K/uL    Lymphocytes Absolute 1.17 0.70 - 4.00 K/uL    Monocytes Absolute 1.64 (H) 0.36 - 0.77 K/uL    RBC Morphology Normal Normal, Not Performed    PLT Morphology Normal Normal, Not Performed    Total WBC Counted 100    Urinalysis reflex to culture    Collection Time: 12/24/23  2:15 AM   Result Value Ref Range    Color, Ur Dark Yellow Yellow, Dark Yellow    Clarity, Ur Turbid (A) Clear    Specific Gravity, Ur >=1.030 1.005 - 1.030    pH, Ur 5.0 5.0 - 8.0    Protein,Ur 100 (A) Negative mg/dL    Glucose,Ur Negative Negative mg/dL    Ketones, Ur Negative Negative mg/dL    Bilirubin, Ur Small (A) Negative    Blood, Ur Large (A) Negative    Urobilinogen, Ur 0.2 0.2-1.0 E.U./dl E.U./dl    Nitrite, Ur Negative Negative    Leukocyte Esterase, Ur Small (A) Negative WBC/uL    RBC, Ur 673 (H) 0-4 cells/HPF cells/HPF    WBC, Ur 22 (H) 0-5 cells/HPF cells/HPF    Epithelial Cells, UR 15 (H) 0-5 cells/HPF cells/HPF    Bacteria, Ur None Seen None Seen    Casts, Ur 4 0-4 cells/LPF cells/LPF    Amorphous Crystal, Ur Present (A) Not Present       Results       Procedure Component Value Units Date/Time    Urine culture [[id number]] Collected: 12/24/23 0215    Order Status: Sent Specimen: Urine, Clean Catch Updated: 12/24/23 0304             CT ABDOMEN PELVIS W CONTRAST   Final Result   Small bowel obstruction with a transition point in the region of an enteroenteric anastomosis. There is significant air and fluid distention of the stomach with partially imaged fluid filled distention of the distal esophagus.      Increased now moderate left hydronephrosis with a new large stone in the left renal pelvis which measures up to  1.7 cm. Multiple punctate stones within the dependent urinary bladder. Nonspecific mild-to-moderate distention of the urinary bladder.      [name] 12/24/2023 1:48 AM      XR CHEST 1 VIEW    (Results Pending)        IMPRESSION/PLAN:     59-year-old female H/o appendiceal ca, resection, sbo with resection, partial gastrectomy.  Presented with lower abdominal  pain.  Patient complaining abdomen bloating and discomfort for 1 week and getting worse, accompanied with nausea.     Abdominal pain  SBO  moderate left hydronephrosis with a new large stone in the left renal pelvis  Possible UTI, leukocytosis    CT abdomen:  Small bowel obstruction with a transition point in the region of an enteroenteric anastomosis. There is significant air and fluid distention of the stomach with partially imaged fluid filled distention of the distal esophagus.   Increased now moderate left hydronephrosis with a new large stone in the left renal pelvis which measures up to 1.7 cm. Multiple punctate stones within the dependent urinary bladder. Nonspecific mild-to-moderate distention of the urinary bladder.   Surgery was consulted and suggested NG tube,  N.p.o. with IV fluids  Hold home meds for now, may resume when able to take p.o.    Patient is concerned that her NG tube was pulled out a little bit during transportation to main campus > will get a x-ray to confirm NG tube placement    Urology consulted  - Continue empirical ceftriaxone, follow-up urine culture result    Low body weight BMI 15.5     DVT prophylaxis: Heparin subcu    Discussed with patient at bedside, all questions addressed        Dessie Flow, MD  Attending physician  12/24/2023          [1]   Past Medical History:  Diagnosis Date    Colon cancer (Multi-HCC)     Depression      Duodenal ulcer     GERD (gastroesophageal reflux disease)     Hx of carcinoid syndrome     Hypothyroid      Insomnia     Iron deficiency anemia     Neuropathy     Peptic ulcer disease     Skin cancer,  basal cell    [2]   Patient Active Problem List  Diagnosis    Hx of carcinoid syndrome    Hypothyroid     Insomnia    Iron deficiency anemia    Neuropathy    Peptic ulcer disease    Skin cancer, basal cell    Acquired short bowel syndrome     At risk for falls    Basal cell carcinoma (BCC) of abdomen    Gastrointestinal hypomotility    History of partial gastrectomy    Incontinence of feces    Postural syncope    Chest pain    Hypocalcemia    Nausea    Flatulence, eructation, and gas pain    Bowel obstruction (Multi-HCC)   [3]   Past Surgical History:  Procedure Laterality Date    APPENDECTOMY      COLONOSCOPY      ESOPHAGOGASTRODUODENOSCOPY  11/24/2019    ESOPHAGOGASTRODUODENOSCOPY  04/05/2020    EXPLORATORY LAPAROTOMY  03/12/2020    with partial gastrectomy and redo of Billroth II    EXPLORATORY LAPAROTOMY  01/2019    with Tyrone Gallop patch repair of duodenal ulcer and appendectomy    LYSIS OF ADHESIONS  02/20/2020    with Billroth II    RIGHT COLECTOMY  01/2019   [4] No family history on file.  [5] No Known Allergies

## 2023-12-25 ENCOUNTER — Inpatient Hospital Stay: Admit: 2023-12-25 | Payer: MEDICARE | Primary: Family Medicine

## 2023-12-25 LAB — BASIC METABOLIC PANEL
Anion Gap: 3 mmol/L (ref 3–14)
BUN: 26 mg/dL — ABNORMAL HIGH (ref 6–24)
CO2 (Bicarbonate): 22 mmol/L (ref 20–32)
Calcium: 8.2 mg/dL — ABNORMAL LOW (ref 8.5–10.5)
Chloride: 118 mmol/L — ABNORMAL HIGH (ref 98–110)
Creatinine: 0.5 mg/dL — ABNORMAL LOW (ref 0.55–1.30)
Glucose: 80 mg/dL (ref 70–110)
Potassium: 4.8 mmol/L (ref 3.6–5.2)
Sodium: 143 mmol/L (ref 135–146)
eGFRcr: 91 mL/min/{1.73_m2} (ref >=60)

## 2023-12-25 LAB — CBC
Hematocrit: 33.4 % (ref 32.0–47.0)
Hemoglobin: 10.3 g/dL — ABNORMAL LOW (ref 11.0–16.0)
MCH: 28.1 pg (ref 26.0–34.0)
MCHC: 30.8 g/dL — ABNORMAL LOW (ref 31.0–37.0)
MCV: 91 fL (ref 80.0–100.0)
MPV: 11.8 fL (ref 9.1–12.4)
NRBC %: 0 % (ref 0.0–0.0)
NRBC Absolute: 0 10*3/uL (ref 0.00–2.00)
Platelets: 245 10*3/uL (ref 150–400)
RBC: 3.67 M/uL — ABNORMAL LOW (ref 3.70–5.20)
RDW-CV: 14.6 % — ABNORMAL HIGH (ref 11.5–14.5)
RDW-SD: 48.9 fL (ref 35.0–51.0)
WBC: 6.9 10*3/uL (ref 4.0–11.0)

## 2023-12-25 MED ORDER — acetaminophen (Tylenol) tablet 650 mg
325 | Freq: Once | ORAL | Status: AC
Start: 2023-12-25 — End: 2023-12-25
  Administered 2023-12-26: 03:00:00 650 mg via GASTROSTOMY

## 2023-12-25 MED ORDER — morphine injection 2 mg
2 | INTRAMUSCULAR | Status: AC | PRN
Start: 2023-12-25 — End: 2023-12-27
  Administered 2023-12-25 – 2023-12-26 (×2): 2 mg via INTRAVENOUS

## 2023-12-25 MED ORDER — sodium chloride 0.9 % infusion
0.9 | INTRAVENOUS | Status: AC
Start: 2023-12-25 — End: 2023-12-27
  Administered 2023-12-25 – 2023-12-26 (×2): 100 mL/h via INTRAVENOUS

## 2023-12-25 MED ORDER — acetaminophen (Tylenol) tablet 650 mg
325 | Freq: Once | ORAL | Status: DC
Start: 2023-12-25 — End: 2023-12-25

## 2023-12-25 MED FILL — MORPHINE 4 MG/ML INTRAVENOUS SOLUTION WRAPPER: 4 4 mg/mL | INTRAVENOUS | Qty: 1 | Fill #0

## 2023-12-25 MED FILL — PANTOPRAZOLE 40 MG INTRAVENOUS SOLUTION: 40 40 mg | INTRAVENOUS | Qty: 40 | Fill #0

## 2023-12-25 MED FILL — HEPARIN (PORCINE) 5,000 UNIT/ML INJECTION SOLUTION: 5000 5,000 unit/mL | INTRAMUSCULAR | Qty: 1 | Fill #0

## 2023-12-25 MED FILL — CEFTRIAXONE IVPB 1 G MINI-BAG PLUS IN 100 ML NS: 1.0000 1.0000 g | Qty: 1 | Fill #0

## 2023-12-25 MED FILL — MORPHINE 2 MG/ML INJECTION WRAPPER: 2 2 mg/mL | INTRAMUSCULAR | Qty: 1 | Fill #0

## 2023-12-25 NOTE — Nursing Note (Signed)
 CXR showing that NGT is not in the correct position. This RN advanced NGT by 5cm per hospitalist, current measurement is 67cm. Repeat CXR confirming correct placement. Per hospitalist pt is to continue with low continuous suctioning and defer clamping trial.

## 2023-12-25 NOTE — Progress Notes (Signed)
 PROGRESS NOTE    Today's Date: 12/25/2023  MRN: [id number]  Name: [name]  DOB: [birth year]    Subjective   Feels better.  She feels as though her admission problem has almost resolved.  She is passing plenty of flatus.     Objective     Last Recorded Vitals  Blood pressure 126/77, pulse 90, temperature 36.6 C (97.9 F), temperature source Oral, resp. rate 17, height 1.6 m, weight 39.8 kg, SpO2 97%.    Physical Exam  NGT: 350 cc thin bilious output overnight (12 hours)  GENERAL: No acute distress  HEENT: Ears, nose and throat grossly normal. No adenopathy  Heart: RRR  Lungs: Non-labored breathing, symmetric chest wall movement  Abdomen: Flat, soft, non-distended, non-tender, no hepatosplenomegaly  Extremities: No deformities  Neuro: A&O x 3  Psych: Normal judgment     Medications        Start Medication Dose/Rate, Route, Frequency Ordered Stop    12/25/23 0913 morphine injection 2 mg         2 mg, IV, Every 4 hours PRN 12/25/23 0913 12/27/23 0359    12/25/23 0730 sodium chloride 0.9 % infusion         100 mL/hr, IV, Continuous 12/25/23 0708 12/27/23 0807    12/25/23 0230 cefTRIAXone (Rocephin) 1 g in sodium chloride 0.9 % 100 mL IVPB-MBP         1 g, IV, Every 24 hours 12/24/23 1001 12/30/23 0229    12/24/23 1030 heparin (porcine) injection 5,000 Units         5,000 Units, subQ, Every 12 hours scheduled 12/24/23 1007 --    12/24/23 1015 pantoprazole (ProtoNix) injection 40 mg         40 mg, IV, Daily 12/24/23 1000 --    12/24/23 0959 ondansetron (Zofran) injection 4 mg         4 mg, IV, Every 6 hours PRN 12/24/23 0959 --    12/24/23 0405 sodium chloride 0.9 % infusion         100 mL/hr, IV, Continuous 12/24/23 0401 12/26/23 0404                       Assessment/Plan   Principal Problem:    Bowel obstruction (Multi-HCC)    Resolving/resolved small bowel obstruction.  Okay to remove the NG tube and start clear liquids.  Discussed with hospitalist.    Total time: 20 minutes.       Angela Barban, MD

## 2023-12-25 NOTE — Progress Notes (Signed)
 [facility] - [address] [place] [address]        Name: [name]  MRN: [id number]    SUBJECTIVE       Patient was seen and assessed in the am.     KUB and clamping trial for today     No output documented on day shift yday (12/24/23)      Would be cautious in removing NGT today   KUB - NGT requiring advancement -- repeat imaging for placement   Clamping trial in 4 hours ~ 4 PM    Home meds on hold due to NPO status it can be resumed when she is on clear liquid diet      REVIEW OF SYSTEMS   ROS reviewed and negative unless stated above    Vitals:  Visit Vitals  BP 130/65 (BP Location: Right arm, Patient Position: Lying)   Pulse 89   Temp 36.2 C (97.2 F) (Oral)   Resp 16        I/O's:    Intake/Output Summary (Last 24 hours) at 12/25/2023 1214  Last data filed at 12/25/2023 0500  Gross per 24 hour   Intake 900 ml   Output 352 ml   Net 548 ml         Intake/Output Summary (Last 24 hours) at 12/25/2023 1214  Last data filed at 12/25/2023 0500  Gross per 24 hour   Intake 900 ml   Output 352 ml   Net 548 ml       PHYSICAL EXAMINATION   General Appearance: NAD   HEENT: NGT in place   Resp: Normal   Cardiac: s1s2  Abdomen: Non-distended, non-tender   Musculoskeletal: Normal ROM  Psych: normal affect  Neuro: Alert        Weight Hx  Wt Readings from Last 5 Encounters:   12/23/23 39.8 kg   12/22/23 40.4 kg   08/04/23 43.1 kg   05/05/23 42.2 kg   02/03/23 43.1 kg         LABS/MICROBIOLOGY     Results from last 7 days   Lab Units 12/25/23  0732 12/23/23  2255 12/23/23  2255 12/23/23  2157   WBC AUTO K/uL 6.9  --  23.4*  --    HEMOGLOBIN g/dL 11.9*   < > 14.7  --    HEMATOCRIT % 33.4   < > 36.1  --    PLATELETS AUTO K/uL 245   < > 343  --    SODIUM mmol/L 143  --   --  135   POTASSIUM mmol/L 4.8  --   --  4.1   CHLORIDE mmol/L 118*  --   --  107   CO2 mmol/L 22  --   --  17*   BUN mg/dL 26*  --   --  36*   CREATININE mg/dL 8.29*  --   --  5.62   ANION GAP mmol/L 3  --   --  11   CALCIUM mg/dL 8.2*  --    --  7.8*   ALT U/L  --   --   --  45   AST U/L  --   --   --  59*   ALK PHOS U/L  --   --   --  248*   BILIRUBIN TOTAL mg/dL  --   --   --  0.5    < > = values in this interval not  displayed.           .lab      Output by Drain (mL) 12/23/23 0700 - 12/23/23 1859 12/23/23 1900 - 12/24/23 0659 12/24/23 0700 - 12/24/23 1859 12/24/23 1900 - 12/25/23 0659 12/25/23 0700 - 12/25/23 1214   Requested LDAs do not have output data documented.              Blood Cultures: No results found for: BLOODCX   Urine Cultures: No results found for: URINECX        MEDICATIONS   Scheduled Medications:  Scheduled Medications[1]     Continuous Infusion:  Continuous Medications[2]     PRN Medications:   PRN Medications[3]   SURGICAL HISTORY   Surgical History[4]         ASSESSMENT AND PLAN          89 year old female H/o appendiceal ca, resection, sbo with resection, partial gastrectomy.  Presented with lower abdominal pain.  Patient complaining abdomen bloating and discomfort for 1 week and getting worse, accompanied with nausea.        #SBO  Moderate left hydronephrosis with a new large stone in the left renal pelvis  Possible UTI, leukocytosis  CT abdomen:  Small bowel obstruction with a transition point in the region of an enteroenteric anastomosis. There is significant air and fluid distention of the stomach with partially imaged fluid filled distention of the distal esophagus.   Increased now moderate left hydronephrosis with a new large stone in the left renal pelvis which measures up to 1.7 cm. Multiple punctate stones within the dependent urinary bladder. Nonspecific mild-to-moderate distention of the urinary bladder.   Surgery was consulted and suggested NG tube,  N.p.o. with IV fluids  -Hold home meds for now, may resume when able to take p.o.  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   -Urology consulted  -Patient will continue empirical ceftriaxone, follow-up urine  culture result    #Nutrition  Body mass index is 15.54 kg/m.     DVT Prophylaxis: heparin sc   Code Status: Full Code   Diet/Nutrition: NPO diet  DISPOSITION:   Expected Discharge Date ~   48-72 hours          [1] cefTRIAXone, 1 g, intravenous, q24h  heparin (porcine), 5,000 Units, subcutaneous, q12h SCH  pantoprazole, 40 mg, intravenous, Daily  [2] sodium chloride, 100 mL/hr, Last Rate: 100 mL/hr (12/25/23 0053)  sodium chloride, 100 mL/hr, Last Rate: 100 mL/hr (12/25/23 0808)  [3] PRN medications: morphine, ondansetron  [4]   Past Surgical History:  Procedure Laterality Date    APPENDECTOMY      COLONOSCOPY      ESOPHAGOGASTRODUODENOSCOPY  11/24/2019    ESOPHAGOGASTRODUODENOSCOPY  04/05/2020    EXPLORATORY LAPAROTOMY  03/12/2020    with partial gastrectomy and redo of Billroth II    EXPLORATORY LAPAROTOMY  01/2019    with Tyrone Gallop patch repair of duodenal ulcer and appendectomy    LYSIS OF ADHESIONS  02/20/2020    with Billroth II    RIGHT COLECTOMY  01/2019

## 2023-12-25 NOTE — Progress Notes (Cosign Needed)
 Department of Urology  Daily Progress Note    Patient Name: [name]   Date of Birth: 1935/03/05   Admission Date: 12/23/2023  9:36 PM   Today's Date: 12/25/2023     Subjective  Left flank pain improved. Has been voiding. Denies fever or chills.     Current Medications:   Scheduled Medications[1]  Continuous Medications[2]  PRN Medications[3]     Objective  Vitals:    12/24/23 1338 12/24/23 1633 12/24/23 1938 12/25/23 0500   BP: 120/71 (!) 147/80 139/58 130/65   BP Location: Right arm Right arm Left arm Right arm   Patient Position: Lying Semi-Fowler Lying Lying   Pulse: 84 100 93 89   Resp: 17 16  16    Temp: 36.7 C (98.1 F) 36.6 C (97.8 F) 36.9 C (98.5 F) 36.2 C (97.2 F)   TempSrc: Oral Oral Oral Oral   SpO2: 100% (!) 94% 95% 96%   Weight:       Height:           Intake/Output:     Intake/Output Summary (Last 24 hours) at 12/25/2023 1136  Last data filed at 12/25/2023 0500  Gross per 24 hour   Intake 900 ml   Output 352 ml   Net 548 ml       Physical Exam:  General- No acute distress, alert & oriented x3  ENT- Mucous membranes moist  Lungs- Normal effort, no audible wheezing, NGT in place  Extremities- No edema in bilateral lower extremities  Skin- Warm and dry  Abdomen- Nondistended, soft, nontender to palpation, no CVA tenderness bilaterally    Lab Results   Component Value Date    WBC 6.9 12/25/2023    HGB 10.3 (L) 12/25/2023    HCT 33.4 12/25/2023    MCV 91.0 12/25/2023    PLT 245 12/25/2023      Lab Results   Component Value Date    GLUCOSE 80 12/25/2023    CALCIUM 8.2 (L) 12/25/2023    NA 143 12/25/2023    K 4.8 12/25/2023    CO2 22 12/25/2023    CL 118 (H) 12/25/2023    BUN 26 (H) 12/25/2023    CREATININE 0.50 (L) 12/25/2023        Microbiology:   Results       Procedure Component Value Units Date/Time    Urine culture [[id number]] Collected: 12/24/23 0215    Order Status: Sent Specimen: Urine, Clean Catch Updated: 12/24/23 0304           Assessment  89 y.o. 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 Renal function continues to be stable, urine culture pending, leukocytosis resolved, left flank pain improved.    Plan  - Follow up urine culture, treat UTI as needed  - Outpatient follow up with urology for discussion regarding left renal stone  - Urology will sign off, please reach back out with any further questions or concerns    Antwan Bribiesca, PA-C  Urology         [1] cefTRIAXone, 1 g, intravenous, q24h  heparin (porcine), 5,000 Units, subcutaneous, q12h SCH  pantoprazole, 40 mg, intravenous, Daily  [2] sodium chloride, 100 mL/hr, Last Rate: 100 mL/hr (12/25/23 0053)  sodium chloride, 100 mL/hr, Last Rate: 100 mL/hr (12/25/23 0808)  [3] PRN medications: morphine, ondansetron

## 2023-12-26 ENCOUNTER — Inpatient Hospital Stay: Admit: 2023-12-26 | Payer: MEDICARE | Primary: Family Medicine

## 2023-12-26 LAB — COMPREHENSIVE METABOLIC PANEL
ALT: 30 U/L (ref 0–55)
AST: 29 U/L (ref 6–42)
Albumin: 2.4 g/dL — ABNORMAL LOW (ref 3.2–5.0)
Alkaline phosphatase: 170 U/L — ABNORMAL HIGH (ref 30–130)
Anion Gap: 9 mmol/L (ref 3–14)
BUN: 21 mg/dL (ref 6–24)
Bilirubin, total: 0.2 mg/dL (ref 0.2–1.2)
CO2 (Bicarbonate): 19 mmol/L — ABNORMAL LOW (ref 20–32)
Calcium: 8.1 mg/dL — ABNORMAL LOW (ref 8.5–10.5)
Chloride: 113 mmol/L — ABNORMAL HIGH (ref 98–110)
Creatinine: 0.52 mg/dL — ABNORMAL LOW (ref 0.55–1.30)
Glucose: 57 mg/dL — ABNORMAL LOW (ref 70–110)
Potassium: 3.8 mmol/L (ref 3.6–5.2)
Protein, total: 5 g/dL — ABNORMAL LOW (ref 6.0–8.4)
Sodium: 141 mmol/L (ref 135–146)
eGFRcr: 90 mL/min/{1.73_m2} (ref >=60)

## 2023-12-26 LAB — CBC WITH DIFFERENTIAL
Basophils %: 0.3 %
Basophils Absolute: 0.02 10*3/uL (ref 0.00–0.22)
Eosinophils %: 0.2 %
Eosinophils Absolute: 0.01 10*3/uL (ref 0.00–0.50)
Hematocrit: 33.9 % (ref 32.0–47.0)
Hemoglobin: 10.7 g/dL — ABNORMAL LOW (ref 11.0–16.0)
Immature Granulocytes %: 0.3 %
Immature Granulocytes Absolute: 0.02 10*3/uL (ref 0.00–0.10)
Lymphocyte %: 16.7 %
Lymphocytes Absolute: 1.04 10*3/uL (ref 0.70–4.00)
MCH: 28.2 pg (ref 26.0–34.0)
MCHC: 31.6 g/dL (ref 31.0–37.0)
MCV: 89.2 fL (ref 80.0–100.0)
MPV: 11.9 fL (ref 9.1–12.4)
Monocytes %: 7.4 %
Monocytes Absolute: 0.46 10*3/uL (ref 0.36–0.77)
NRBC %: 0 % (ref 0.0–0.0)
NRBC Absolute: 0 10*3/uL (ref 0.00–2.00)
Neutrophil %: 75.1 %
Neutrophils Absolute: 4.66 10*3/uL (ref 1.50–7.95)
Platelets: 258 10*3/uL (ref 150–400)
RBC: 3.8 M/uL (ref 3.70–5.20)
RDW-CV: 14.6 % — ABNORMAL HIGH (ref 11.5–14.5)
RDW-SD: 47.1 fL (ref 35.0–51.0)
WBC: 6.2 10*3/uL (ref 4.0–11.0)

## 2023-12-26 LAB — URINE CULTURE: Urine Culture: NO GROWTH

## 2023-12-26 MED ORDER — mirtazapine (Remeron) tablet 15 mg
15 | Freq: Every evening | ORAL | Status: DC
Start: 2023-12-26 — End: 2023-12-29
  Administered 2023-12-27 – 2023-12-29 (×3): 15 mg via ORAL

## 2023-12-26 MED ORDER — levothyroxine (Synthroid, Levoxyl) tablet 112 mcg
112 | Freq: Every day | ORAL | Status: DC
Start: 2023-12-26 — End: 2023-12-29
  Administered 2023-12-27 – 2023-12-29 (×3): 112 ug via ORAL

## 2023-12-26 MED ORDER — amitriptyline (Elavil) tablet 50 mg
50 | Freq: Every evening | ORAL | Status: DC
Start: 2023-12-26 — End: 2023-12-29
  Administered 2023-12-27 – 2023-12-29 (×3): 50 mg via ORAL

## 2023-12-26 MED ORDER — pregabalin (Lyrica) capsule 50 mg
25 | Freq: Every day | ORAL | Status: DC
Start: 2023-12-26 — End: 2023-12-29
  Administered 2023-12-27 – 2023-12-29 (×3): 50 mg via ORAL

## 2023-12-26 MED ORDER — prucalopride tablet 1 mg (Motegrity 1mg tab) PATIENT OWN MED
1 | Freq: Every day | ORAL | Status: DC
Start: 2023-12-26 — End: 2023-12-28
  Administered 2023-12-27 – 2023-12-28 (×2): 1 mg via ORAL

## 2023-12-26 MED ORDER — pantoprazole (ProtoNix) EC tablet 40 mg
40 | Freq: Two times a day (BID) | ORAL | Status: DC
Start: 2023-12-26 — End: 2023-12-28
  Administered 2023-12-27 – 2023-12-28 (×3): 40 mg via ORAL

## 2023-12-26 MED FILL — HEPARIN (PORCINE) 5,000 UNIT/ML INJECTION SOLUTION: 5000 5,000 unit/mL | INTRAMUSCULAR | Qty: 1 | Fill #0

## 2023-12-26 MED FILL — PANTOPRAZOLE 40 MG INTRAVENOUS SOLUTION: 40 40 mg | INTRAVENOUS | Qty: 40 | Fill #0

## 2023-12-26 MED FILL — ONDANSETRON HCL (PF) 4 MG/2 ML INJECTION SOLUTION: 4 4 mg/2 mL | INTRAMUSCULAR | Qty: 2 | Fill #0

## 2023-12-26 MED FILL — ACETAMINOPHEN 325 MG TABLET: 325 325 mg | ORAL | Qty: 2 | Fill #0

## 2023-12-26 MED FILL — CEFTRIAXONE IVPB 1 G MINI-BAG PLUS IN 100 ML NS: 1.0000 1.0000 g | Qty: 1 | Fill #0

## 2023-12-26 MED FILL — MORPHINE 2 MG/ML INJECTION WRAPPER: 2 2 mg/mL | INTRAMUSCULAR | Qty: 1 | Fill #0

## 2023-12-26 NOTE — Nursing Note (Signed)
 Pt cont to touch connect .disconnect ngt, and touch tape at tube and nose instructed several times not to touch,anxious but refuses any meds to help with anxiety

## 2023-12-26 NOTE — Nursing Note (Addendum)
 7pm to 7am 10cc grn bile  in cannister of ngt , tube patent ,no nausea no vomiting. BS hypoactive . Amb in hall x 3 . Pt anxious over her home meds not being given  with ngt  in, TLC and teaching done. Cont, to touch tube as  she worries about it. TLC  done and educated pt on importance of not touching tube. Pt RLS bothering her

## 2023-12-26 NOTE — Progress Notes (Signed)
 [facility] - [address], [place] [address]        Name: [name]  MRN: [id number]    SUBJECTIVE   Patient was seen and examined lying bed she is alert oriented in no acute distress.  NG tube removed per general surgery.  Faint bowel sounds appreciated diet advanced to clear liquids.      REVIEW OF SYSTEMS   ROS reviewed and negative unless stated above    Vitals:  Visit Vitals  BP (!) 145/78   Pulse 81   Temp 36.9 C (98.4 F) (Oral)   Resp 18        I/O's:    Intake/Output Summary (Last 24 hours) at 12/26/2023 0848  Last data filed at 12/25/2023 1800  Gross per 24 hour   Intake 0 ml   Output 300 ml   Net -300 ml         Intake/Output Summary (Last 24 hours) at 12/26/2023 0848  Last data filed at 12/25/2023 1800  Gross per 24 hour   Intake 0 ml   Output 300 ml   Net -300 ml       PHYSICAL EXAMINATION   General Appearance: NAD   HEENT: NGT in place   Resp: Normal   Cardiac: s1s2  Abdomen: Non-distended, non-tender   Musculoskeletal: Normal ROM  Psych: normal affect  Neuro: Alert    Weight Hx  Wt Readings from Last 5 Encounters:   12/23/23 39.8 kg   12/22/23 40.4 kg   08/04/23 43.1 kg   05/05/23 42.2 kg   02/03/23 43.1 kg         LABS/MICROBIOLOGY     Results from last 7 days   Lab Units 12/26/23  0654 12/25/23  0732 12/23/23  2255 12/23/23  2255 12/23/23  2157   WBC AUTO K/uL 6.2 6.9  --  23.4*  --    HEMOGLOBIN g/dL 95.6* 21.3*   < > 08.6  --    HEMATOCRIT % 33.9 33.4   < > 36.1  --    PLATELETS AUTO K/uL 258 245   < > 343  --    SODIUM mmol/L 141 143  --   --  135   POTASSIUM mmol/L 3.8 4.8  --   --  4.1   CHLORIDE mmol/L 113* 118*  --   --  107   CO2 mmol/L 19* 22  --   --  17*   BUN mg/dL 21 26*  --   --  36*   CREATININE mg/dL 5.78* 4.69*  --   --  6.29   ANION GAP mmol/L 9 3  --   --  11   CALCIUM mg/dL 8.1* 8.2*  --   --  7.8*   ALT U/L 30  --   --   --  45   AST U/L 29  --   --   --  59*   ALK PHOS U/L 170*  --   --   --  248*   BILIRUBIN TOTAL mg/dL 0.2  --   --   --  0.5    < > =  values in this interval not displayed.         Blood Cultures: No results found for: BLOODCX   Urine Cultures:   Lab Results   Component Value Date    URINECX No growth to date 12/24/2023       MEDICATIONS   Scheduled Medications:  Scheduled Medications[1]     Continuous Infusion:  Continuous Medications[2]     PRN Medications:   PRN Medications[3]   SURGICAL HISTORY   Surgical History[4]         ASSESSMENT AND PLAN          This is an 89 year old female H/o appendiceal ca, resection, sbo with resection, partial gastrectomy.  Presented with lower abdominal pain.  Patient complaining abdomen bloating and discomfort for 1 week and getting worse, accompanied with nausea.      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   #Possible UTI, culture without growth  #Leukocytosis, resolved  CT abdomen:  Small bowel obstruction with a transition point in the region of an enteroenteric anastomosis. There is significant air and fluid distention of the stomach with partially imaged fluid filled distention of the distal esophagus.   Increased now moderate left hydronephrosis with a new large stone in the left renal pelvis which measures up to 1.7 cm. Multiple punctate stones within the dependent urinary bladder. Nonspecific mild-to-moderate distention of the urinary bladder.   General surgery impression:  Resolving/resolved small bowel obstruction. Okay to remove the NG tube and start clear liquids.   Diet advanced to clear liquids  NG tube removed  Urological impression:  Follow up urine culture, treat UTI as needed  Outpatient follow up with urology for discussion regarding left renal stone  Patient will continue empirical ceftriaxone    #Hypothyroidism   Synthroid 112 mcg daily    #Anxiety  Mirtazapine 15 mg nightly  Elavil 50 mg  nightly    #Nutrition  #Severe malnutrition  Chest pain 15 mg nightly  Dietary consult placed.  Dietary supplements added  Body mass index is 15.54 kg/m.     #DVT Prophylaxis: heparin sc   #Code Status: Full Code   Diet/Nutrition: Adult diet Room service; Clear liquid  #DISPOSITION:   Expected Discharge Date ~ 48-72 hours          [1] [Held by provider] amitriptyline, 50 mg, oral, Nightly  cefTRIAXone, 1 g, intravenous, q24h  heparin (porcine), 5,000 Units, subcutaneous, q12h [facility]  [Held by provider] levothyroxine, 112 mcg, oral, Daily  [Held by provider] mirtazapine, 15 mg, oral, Nightly  [Held by provider] pantoprazole, 40 mg, oral, BID  pantoprazole, 40 mg, intravenous, Daily  [Held by provider] pregabalin, 50 mg, oral, Daily     [2] sodium chloride, 100 mL/hr, Last Rate: 100 mL/hr (12/26/23 0131)     [3] PRN medications: morphine, ondansetron  [4]   Past Surgical History:  Procedure Laterality Date    APPENDECTOMY      COLONOSCOPY      ESOPHAGOGASTRODUODENOSCOPY  11/24/2019    ESOPHAGOGASTRODUODENOSCOPY  04/05/2020    EXPLORATORY LAPAROTOMY  03/12/2020    with partial gastrectomy and redo of Billroth II    EXPLORATORY LAPAROTOMY  01/2019    with Tyrone Gallop patch repair of duodenal ulcer and appendectomy    LYSIS OF ADHESIONS  02/20/2020    with Billroth II    RIGHT COLECTOMY  01/2019

## 2023-12-26 NOTE — Progress Notes (Signed)
 PROGRESS NOTE    Today's Date: 12/26/2023  MRN: [id number]  Name: [name]  DOB: [birth year]    Subjective   Feels much better.  No NG tube output overnight.  Continues to pass significant flatus.  Feels as though her obstruction has resolved.     Objective     Last Recorded Vitals  Blood pressure 136/72, pulse 86, temperature 36.6 C (97.8 F), temperature source Oral, resp. rate 18, height 1.6 m, weight 39.8 kg, SpO2 95%.    Physical Exam  GENERAL: No acute distress  HEENT: Ears, nose and throat grossly normal. No adenopathy  Heart: RRR  Lungs: Non-labored breathing, symmetric chest wall movement  Abdomen: Flat, soft, non-distended, non-tender, no hepatosplenomegaly  Extremities: No deformities  Neuro: A&O x 3  Psych: Normal judgment     Medications        Start Medication Dose/Rate, Route, Frequency Ordered Stop    12/26/23 2200 amitriptyline (Elavil) tablet 50 mg         50 mg, oral, Nightly 12/26/23 0652 --    12/26/23 2200 mirtazapine (Remeron) tablet 15 mg         15 mg, oral, Nightly 12/26/23 0653 --    12/26/23 1545 Non-Formulary Medication         --, oral, Daily 12/26/23 1525 --    12/26/23 0900 levothyroxine (Synthroid, Levoxyl) tablet 112 mcg         112 mcg, oral, Daily 12/26/23 0652 --    12/26/23 0900 pantoprazole (ProtoNix) EC tablet 40 mg         40 mg, oral, 2 times daily 12/26/23 0653 --    12/26/23 0900 pregabalin (Lyrica) capsule 50 mg         50 mg, oral, Daily 12/26/23 0654 --    12/25/23 0913 morphine injection 2 mg         2 mg, IV, Every 4 hours PRN 12/25/23 0913 12/27/23 0359    12/25/23 0730 sodium chloride 0.9 % infusion         100 mL/hr, IV, Continuous 12/25/23 0708 12/27/23 0807    12/25/23 0230 cefTRIAXone (Rocephin) 1 g in sodium chloride 0.9 % 100 mL IVPB-MBP         1 g, IV, Every 24 hours 12/24/23 1001 12/30/23 0229    12/24/23 1030 heparin (porcine) injection 5,000 Units         5,000 Units, subQ, Every 12 hours scheduled 12/24/23 1007 --    12/24/23 1015 pantoprazole  (ProtoNix) injection 40 mg         40 mg, IV, Daily 12/24/23 1000 --    12/24/23 0959 ondansetron (Zofran) injection 4 mg         4 mg, IV, Every 6 hours PRN 12/24/23 0959 --    12/24/23 0405 sodium chloride 0.9 % infusion         100 mL/hr, IV, Continuous 12/24/23 0401 12/26/23 0404                       Assessment/Plan   Principal Problem:    Bowel obstruction (Multi-HCC)    Resolved SBO.  Clear liquids and advance to a regular diet as tolerated.  [facility] back tomorrow    Total time: 15 minutes.       Angela Barban, MD

## 2023-12-27 LAB — CBC WITH DIFFERENTIAL
Basophils %: 0.3 %
Basophils Absolute: 0.02 10*3/uL (ref 0.00–0.22)
Eosinophils %: 1.1 %
Eosinophils Absolute: 0.07 10*3/uL (ref 0.00–0.50)
Hematocrit: 36.4 % (ref 32.0–47.0)
Hemoglobin: 11.5 g/dL (ref 11.0–16.0)
Immature Granulocytes %: 0.3 %
Immature Granulocytes Absolute: 0.02 10*3/uL (ref 0.00–0.10)
Lymphocyte %: 21.6 %
Lymphocytes Absolute: 1.4 10*3/uL (ref 0.70–4.00)
MCH: 27.8 pg (ref 26.0–34.0)
MCHC: 31.6 g/dL (ref 31.0–37.0)
MCV: 88.1 fL (ref 80.0–100.0)
MPV: 11.2 fL (ref 9.1–12.4)
Monocytes %: 9.3 %
Monocytes Absolute: 0.6 10*3/uL (ref 0.36–0.77)
NRBC %: 0 % (ref 0.0–0.0)
NRBC Absolute: 0 10*3/uL (ref 0.00–2.00)
Neutrophil %: 67.4 %
Neutrophils Absolute: 4.37 10*3/uL (ref 1.50–7.95)
Platelets: 291 10*3/uL (ref 150–400)
RBC: 4.13 M/uL (ref 3.70–5.20)
RDW-CV: 14.3 % (ref 11.5–14.5)
RDW-SD: 45.8 fL (ref 35.0–51.0)
WBC: 6.5 10*3/uL (ref 4.0–11.0)

## 2023-12-27 LAB — COMPREHENSIVE METABOLIC PANEL
ALT: 29 U/L (ref 0–55)
AST: 24 U/L (ref 6–42)
Albumin: 2.5 g/dL — ABNORMAL LOW (ref 3.2–5.0)
Alkaline phosphatase: 161 U/L — ABNORMAL HIGH (ref 30–130)
Anion Gap: 4 mmol/L (ref 3–14)
BUN: 14 mg/dL (ref 6–24)
Bilirubin, total: 0.3 mg/dL (ref 0.2–1.2)
CO2 (Bicarbonate): 27 mmol/L (ref 20–32)
Calcium: 7.9 mg/dL — ABNORMAL LOW (ref 8.5–10.5)
Chloride: 110 mmol/L (ref 98–110)
Creatinine: 0.59 mg/dL (ref 0.55–1.30)
Glucose: 89 mg/dL (ref 70–110)
Potassium: 4.6 mmol/L (ref 3.6–5.2)
Protein, total: 5.1 g/dL — ABNORMAL LOW (ref 6.0–8.4)
Sodium: 141 mmol/L (ref 135–146)
eGFRcr: 87 mL/min/{1.73_m2} (ref >=60)

## 2023-12-27 MED ORDER — docusate sodium (Colace) capsule 100 mg
100 | Freq: Two times a day (BID) | ORAL | Status: DC
Start: 2023-12-27 — End: 2023-12-29
  Administered 2023-12-27 – 2023-12-29 (×5): 100 mg via ORAL

## 2023-12-27 MED ORDER — pantoprazole (ProtoNix) EC tablet 40 mg
40 | Freq: Every day | ORAL | Status: DC
Start: 2023-12-27 — End: 2023-12-28
  Administered 2023-12-28: 10:00:00 40 mg via ORAL

## 2023-12-27 MED FILL — DOCUSATE SODIUM 100 MG CAPSULE: 100 100 mg | ORAL | Qty: 1 | Fill #0

## 2023-12-27 MED FILL — PANTOPRAZOLE 40 MG TABLET,DELAYED RELEASE: 40 40 mg | ORAL | Qty: 1 | Fill #0

## 2023-12-27 MED FILL — PREGABALIN 25 MG CAPSULE: 25 25 mg | ORAL | Qty: 2 | Fill #0

## 2023-12-27 MED FILL — AMITRIPTYLINE 50 MG TABLET: 50 50 mg | ORAL | Qty: 1 | Fill #0

## 2023-12-27 MED FILL — HEPARIN (PORCINE) 5,000 UNIT/ML INJECTION SOLUTION: 5000 5,000 unit/mL | INTRAMUSCULAR | Qty: 1 | Fill #0

## 2023-12-27 MED FILL — LEVOTHYROXINE 112 MCG TABLET: 112 112 mcg | ORAL | Qty: 1 | Fill #0

## 2023-12-27 MED FILL — ONDANSETRON HCL (PF) 4 MG/2 ML INJECTION SOLUTION: 4 4 mg/2 mL | INTRAMUSCULAR | Qty: 2 | Fill #0

## 2023-12-27 MED FILL — MIRTAZAPINE 15 MG TABLET: 15 15 mg | ORAL | Qty: 1 | Fill #0

## 2023-12-27 MED FILL — CEFTRIAXONE IVPB 1 G MINI-BAG PLUS IN 100 ML NS: 1.0000 1.0000 g | Qty: 1 | Fill #0

## 2023-12-27 NOTE — Telephone Encounter (Signed)
-----   Message from Katelyn Waugh, [place] sent at 12/25/2023 11:38 AM EDT -----  Regarding: Follow up Renal stone  [employer],     Ms. Leblond was seen for a left renal stone. Will need outpatient follow up for establishment of care in 6ish weeks.    Thanks!  Katelyn

## 2023-12-27 NOTE — Progress Notes (Addendum)
 Malnutrition Assessment  Name: [name]  MRN: [id number]  DOB: 1935-07-03    Date Seen: 12/27/2023  Hospital Day: 3  Admission Diagnosis:   Bowel obstruction (Multi-HCC) [K56.609]  Nausea [R11.0]  Small bowel obstruction (Multi-HCC) [K56.609]  Chronic diarrhea [K52.9]  Lower abdominal pain [R10.30]  Hydronephrosis with ureteropelvic junction (UPJ) obstruction [Q62.11]  Calculus of renal pelvis [N20.0]    If you are the provider caring for this patient, please add the following to your coded Problem List and Progress Note:    Patient meets criteria for Severe Protein-Calorie Malnutrition in the context of Chronic Illness.  Nutrition is following. Interventions include: Nutritional Counseling/Education    Malnutrition Criteria:   Malnutrition Assessment  Context of Malnutrition: Chronic illness  Chronic Illness - Body Fat Loss: 7 - Severe body fat loss  Chronic Illness - Body Fat Loss Location: Triceps, Fat overlying ribs, Buccal region, Orbital  Chronic Illness - Muscle Mass Los: 7 - Severe muscle mass loss  Chronic Illness - Muscle Mass Locatio: Thigh (quadraceps), Clavicles (pectoralis &deltoids), Calf (gastrocnemius), Temples (temporalis)  Chronic Illness - Malnutrition Score: 14  Chronic Illness - Malnutrition Status: Severe malnutrition        For further details of nutrition assessment and recommendations, please refer to Dietitian Progress/Consult Note of 12/27/2023.     Nutrition will continue to follow per Standards of Care.     Lolly Riser, RD

## 2023-12-27 NOTE — Progress Notes (Signed)
 GENERAL SURGERY  DAILY PROGRESS NOTE    PATIENTS NAME & MRN: [name], MRN: [id number]  ADMITTED ON: 12/23/2023  9:36 PM (3days)  LOCATION, DATE & TIME: 3304/3304-B, 12/27/2023 at 8:52 AM  AUTHOR: Hampton Levins, PA      HISTORY: [name] is a(n) 89 y.o. female who presented with Bowel obstruction (Multi-HCC).    Overnight events: no overnight events. Patient states she was a bit nauseous this morning. No vomiting. Doing ok on clear liquids. Passing gas. No BM.   Current diet: Adult diet Room service; Clear liquid.      PHYSICAL EXAM  VS: BP (!) 154/72 (BP Location: Left arm, Patient Position: Lying)   Pulse 89   Temp 36.7 C (98 F) (Oral)   Resp 16   Ht 1.6 m   Wt 39.8 kg   SpO2 96%   BMI 15.54 kg/m     Physical Exam    General: NAD.  Heart: No LE edema, normal rate  Lungs: No stridor, breathing comfortably.  Abdomen: Soft, ND, NT.  Extremities: Warm, soft and NT.    I&O FLOWSHEET DATA  No intake/output data recorded.      RECENT LABS  Results from last 7 days   Lab Units 12/27/23  0543 12/26/23  0654 12/25/23  0732 12/23/23  2255   HEMATOCRIT % 36.4 33.9 33.4 36.1   HEMOGLOBIN g/dL 54.0 98.1* 19.1* 47.8   WBC AUTO K/uL 6.5 6.2 6.9 23.4*   PLATELETS AUTO K/uL 291 258 245 343      Results from last 7 days   Lab Units 12/27/23  0543 12/26/23  0654 12/25/23  0732 12/23/23  2157   GLUCOSE mg/dL 89 57* 80 295*   CALCIUM mg/dL 7.9* 8.1* 8.2* 7.8*   SODIUM mmol/L 141 141 143 135   POTASSIUM mmol/L 4.6 3.8 4.8 4.1   CO2 mmol/L 27 19* 22 17*   CHLORIDE mmol/L 110 113* 118* 107   BUN mg/dL 14 21 26* 36*   CREATININE mg/dL 6.21 3.08* 6.57* 8.46           Results from last 7 days   Lab Units 12/27/23  0543 12/26/23  0654 12/23/23  2157   ALT U/L 29 30 45   AST U/L 24 29 59*   ALK PHOS U/L 161* 170* 248*   BILIRUBIN TOTAL mg/dL 0.3 0.2 0.5       IMAGING  No new imaging        ASSESSMENT/Plan:   [name] is a(n) 89 y.o. female who presented with Bowel obstruction (Multi-HCC). Today patient a bit  nauseous this morning. Patient does report usual nausea for which she takes montegrity. Patient should start taking her montegrity. Continue her clear liquid diet.           - pain control  - encourage OOB/ambulation    Discussed with Dr. Roseann Condon, PA  General Surgery      I saw and evaluated the patient, participating in the key portions of the service.  I reviewed the Surgical PA's note.  I agree with the findings and plan.  Patient continues to pass gas.  No recent bowel movement.  Per review of the EMR, she was noted to have a liquid bowel movement in the ER.  No significant abdominal pain at this time.  Her Motegrity from home has been ordered, and she has started taking today.  Okay to advance to full liquid  diet.    Keven Pel, MD

## 2023-12-27 NOTE — Progress Notes (Signed)
 12/27/23 1231   Case Management Initial Assessment   Source of Information Patient   Type of Residence Apartment   Lives With Alone   Discharge Services Anticipated at this Time No   Patient Information   Interpreter Needs None   Support Systems Children/Family   Current Patient Responsibilities Work;Personal Care;Housekeeping;Shopping;Meal Prep;Driving;Laundry   PTA Functional Status   Stairs to [employer] 0   Current Home Treatment/Devices/Equipment [employer];Shower Chair   Current Functional Status   Hospital doctor Independent   Walking in Insurance claims handler Used None   Community Mobility Independent   IADL Assistance Needed No   Financial Information   Insurance Confirmed with Patient Yes   Legal Information   Does Patient Have HCP Yes   Discharge Planning   Patient/Family/Caregiver Discharge Preference Home   Anticipated Discharge Disposition Home   Next Actions   Assessment/High Risk Screening Complete     Case Management Progress Note:   72yr female admitted with SBO, Presented with Lower ABD pain, nausea, and bloating. CT ABD confirmed SBO and L Renal stone. Gen surgery  & Urology consulted.  DX SBO, ?UTI  NGT removed 6/8   Tolerating clear Liquids this am and will advance DAT.    Met with Pt Bedside, instructed in my role and verified contact info.  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 States family lives nearby. No needs anticipated at this time  CM to follow  Angelo Barge, RN  12/27/2023  12:35 PM

## 2023-12-27 NOTE — Consults (Signed)
 Nutrition Assessment Initial    Name: [name]  MRN: [id number]  DOB: 08-19-1934    Date Seen: 12/27/2023  Hospital Day: 3    Reason for Visit/Consult:Nutrition Consult    Admission Diagnosis:   Bowel obstruction (Multi-HCC) [K56.609]  Nausea [R11.0]  Small bowel obstruction (Multi-HCC) [K56.609]  Chronic diarrhea [K52.9]  Lower abdominal pain [R10.30]  Hydronephrosis with ureteropelvic junction (UPJ) obstruction [Q62.11]  Calculus of renal pelvis [N20.0]    Pertinent Allergies: NKFA  Allergies[1]    Prior Nutrition Assessment: None Noted at [facility].    Current Diet order:  Dietary Orders (From admission, onward)       Start     Ordered    12/27/23 1030  Adult diet Room service; Full liquid  Diet effective now        Question Answer Comment   Room Service Room service    Diet type: Full liquid        12/27/23 1029    12/26/23 1357  Dietary nutrition supplements  Until discontinued        Question Answer Comment   Delivery All meals    Select supplement: Ensure Clear        12/26/23 1357                    Medical History[2]  Surgical History[3]    Assessment  Pt is an 89 yo F w/pmh significant for appendiceal cancer s/p resection, SBO, partial gastrectomy who p/w 1 week hx of abd bloating, pain/discomfort and nausea. Found to have SBO. Surgery following. Initially had NGT in place for decompression, now s/p removal 6/7. Diet advanced to full liquids. Nutrition is consulted with concern for malnutrition.    Visited pt this morning, sitting at the edge of bed. Working on cream of wheat. Tolerating full liquids well so far although going slowly. Appetite suppressed PTA although reported she has been trying to work on improving her intake. Used to live with her daughter and now lives alone- has been cooking herself some better meals (ie: scallops w/ butter, etc). Reported she does not drink enough water but is trying to work on this. Thinks she may have lost weight recently although uncertain how much. Current wt is  stated. Appears thin and frail on observation with visible severe muscle/fat depletion on NFPE. Given the above, pt meets the criteria for severe malnutrition of chronic illness. Offered ONS, pt stated she dislikes ensure but does have a protein shake she likes at home that she can take. Discussed ways to optimize kcal/protein intake. Encouraged intake as tolerated. Following per [facility].    No pressure ulcers noted. Abd soft, non-distended, +tenderness, hypoactive BS. Last BM 6/3.    Pertinent Labs: (6/9) Na 141, K+ 4.6, glucose 89, BUN 14, Cr 0.59.     GI Assessment  Last BM Date: 12/21/23  Abdomen Inspection: Soft, Rounded  Bowel Sounds (All Quadrants): Hypoactive  Passing Flatus: Yes        Gastrointestinal Symptoms: Constipation       Edema/Skin:  Edema  Edema: None             Labs:  Lab Results   Component Value Date    GLUCOSE 89 12/27/2023    NA 141 12/27/2023    K 4.6 12/27/2023    CL 110 12/27/2023    CO2 27 12/27/2023    BUN 14 12/27/2023    CREATININE 0.59 12/27/2023    CALCIUM 7.9 (L) 12/27/2023  ALBUMIN 2.5 (L) 12/27/2023    PROT 5.1 (L) 12/27/2023      Corrected Calcium: 9.1 (Calculated from:; Serum Albumin: 2.5 g/dL at 07/25/1094  0:45 AM; Calcium (Uncorrected): 7.9 mg/dL at 4/0/9811  9:14 AM)    Medications:  Continuous Medications[4]   Scheduled Medications[5]  PRN Medications[6]     Appetite/Meals per nursing flowsheet:  Percent Meals Eaten (%): 0  Nutrition: Probably inadequate    Wts since admission:  Weight and Method      Date/Time Weight Weight Method Who   12/23/23 2145 39.8 kg Stated JY           Weight History:   Wt Readings from Last 25 Encounters:   12/23/23 39.8 kg   12/22/23 40.4 kg   08/04/23 43.1 kg   05/05/23 42.2 kg   02/03/23 43.1 kg   12/15/22 44 kg   12/12/22 42.9 kg   08/05/22 45.4 kg   12/31/21 44.5 kg   12/09/21 44.9 kg   08/04/21 44.9 kg   07/22/21 45.9 kg   07/02/21 44.9 kg   05/12/21 44.5 kg   12/24/20 41.7 kg   11/20/20 42.2 kg   10/01/20 38.2 kg   08/27/20 41.5 kg    07/23/20 42.2 kg   07/08/20 42.2 kg   06/19/20 42.6 kg   06/11/20 43.5 kg   05/14/20 46.1 kg   05/09/20 46.3 kg   04/26/20 44.9 kg         Anthropometrics:  Height: 160 cm  Weight: 39.8 kg  IBW (kg) (Calculated) : 52.38 kg  BMI (Calculated): 15.54          Estimated Nutrition Needs:  Weight Used for Equation Calculations: 39.8 kg  Energy:  Equation Utilized: kcals/kg  Kcals/kg: 1200-1400 kcal (30-35 kcal/kg BW)  Protein:  Method for Estimating Needs: 1.2-1.5 g/kg BW  Total Protein Estimated Needs : 48-60 g  Fluid:  Method for Estimating Needs: ml/kg  Total Fluid Estimated Needs: 1200-1400 ml (1 ml/kcal or per team)    Malnutrition Criteria:   Malnutrition Assessment  Context of Malnutrition: Chronic illness  Chronic Illness - Body Fat Loss: 7 - Severe body fat loss  Chronic Illness - Body Fat Loss Location: Triceps, Fat overlying ribs, Buccal region, Orbital  Chronic Illness - Muscle Mass Los: 7 - Severe muscle mass loss  Chronic Illness - Muscle Mass Locatio: Thigh (quadraceps), Clavicles (pectoralis &deltoids), Calf (gastrocnemius), Temples (temporalis)  Chronic Illness - Malnutrition Score: 14  Chronic Illness - Malnutrition Status: Severe malnutrition    Nutrition Diagnosis  Nutrition Diagnosis  Nutrition Diagnosis: Malnutrition  Related to: Chronic Illness  As Evidenced By: NFPE revealing severe muscle/fat depletion    Interventions     Collaboration with Team and Nutritional Counseling/Education    Recommendations  ADAT per team/surgery. Encourage intake as tolerated.  Pt politely declined ensure at this time- has a protein shake she prefers to take at home. Discussed high kcal/protein nutrition education.   Pt meets the criteria for severe malnutrition of chronic illness.    Monitoring/Evaluation     PO and/or Supplement intake, Weight trends, Labs, GI Fxn, and Skin Integrity    Follow Up  Next Date for Nutrition Services Follow Up: 12/30/23      Lolly Riser, MS RD LDN CNSC  Clinical Dietitian  ASCOM  #[id number]         [1] No Known Allergies  [2]   Past Medical History:  Diagnosis Date    Colon cancer (Multi-HCC)  Depression      Duodenal ulcer     GERD (gastroesophageal reflux disease)     Hx of carcinoid syndrome     Hypothyroid      Insomnia     Iron deficiency anemia     Neuropathy     Peptic ulcer disease     Skin cancer, basal cell    [3]   Past Surgical History:  Procedure Laterality Date    APPENDECTOMY      COLONOSCOPY      ESOPHAGOGASTRODUODENOSCOPY  11/24/2019    ESOPHAGOGASTRODUODENOSCOPY  04/05/2020    EXPLORATORY LAPAROTOMY  03/12/2020    with partial gastrectomy and redo of Billroth II    EXPLORATORY LAPAROTOMY  01/2019    with Tyrone Gallop patch repair of duodenal ulcer and appendectomy    LYSIS OF ADHESIONS  02/20/2020    with Billroth II    RIGHT COLECTOMY  01/2019   [4]    [5] amitriptyline, 50 mg, oral, Nightly  docusate sodium, 100 mg, oral, BID  heparin (porcine), 5,000 Units, subcutaneous, q12h [facility]  levothyroxine, 112 mcg, oral, Daily  mirtazapine, 15 mg, oral, Nightly  pantoprazole, 40 mg, oral, BID  pantoprazole, 40 mg, oral, Daily before breakfast  pregabalin, 50 mg, oral, Daily  prucalopride, 1 mg, oral, Daily     [6] PRN medications: ondansetron

## 2023-12-27 NOTE — Progress Notes (Addendum)
 [facility] - [address] [place] [address]    Progress Note    Name: [name]  MRN: [id number]    SUBJECTIVE     Patient seen this morning. Reviewed with nursing staff.   Was started on liquid diet yesterday. Passing flatus but no BM. Was nausea this AM. Motegrity was on hold due to obstruction. Was ordered to resume last night but unavailable in pharmacy, daughter brought in med today and had first dose this AM  Tolerated clears. Diet was advanced to full liquid.     REVIEW OF SYSTEMS   ROS reviewed and negative unless stated in HPI   VITALS     Vitals:    12/27/23 0900   BP: 119/60   Pulse: 86   Resp: 16   Temp: 36.8 C (98.2 F)   SpO2: 96%        No intake or output data in the 24 hours ending 12/27/23 1245    PHYSICAL EXAMINATION   General Appearance: NAD   HEENT: EOMI  Resp: clear  Cardiac: s1s12  Abdomen: Non-distended, non-tender, soft. +BSx4  Extremitites: No edema  Musculoskeletal: Normal ROM  Skin: No jaundice, no visible lesions  Psych: normal affect  Neuro: Alert, oriented  LABS/MICROBIOLOGY     Results from last 7 days   Lab Units 12/27/23  0543 12/26/23  0654 12/25/23  0732 12/23/23  2255 12/23/23  2157   WBC AUTO K/uL 6.5 6.2 6.9   < >  --    HEMOGLOBIN g/dL 83.1 51.7* 61.6*   < >  --    HEMATOCRIT % 36.4 33.9 33.4   < >  --    PLATELETS AUTO K/uL 291 258 245   < >  --    SODIUM mmol/L 141 141 143  --  135   POTASSIUM mmol/L 4.6 3.8 4.8  --  4.1   CHLORIDE mmol/L 110 113* 118*  --  107   CO2 mmol/L 27 19* 22  --  17*   BUN mg/dL 14 21 26*  --  36*   CREATININE mg/dL 0.73 7.10* 6.26*  --  9.48   ANION GAP mmol/L 4 9 3   --  11   CALCIUM mg/dL 7.9* 8.1* 8.2*  --  7.8*   ALT U/L 29 30  --   --  45   AST U/L 24 29  --   --  59*   ALK PHOS U/L 161* 170*  --   --  248*   BILIRUBIN TOTAL mg/dL 0.3 0.2  --   --  0.5    < > = values in this interval not displayed.       Results       Procedure Component Value Units Date/Time    Urine culture [[id number]] Collected: 12/24/23  0215    Order Status: Completed Specimen: Urine, Clean Catch Updated: 12/26/23 1038     Urine Culture No growth            Output by Drain (mL) 12/25/23 0700 - 12/25/23 1859 12/25/23 1900 - 12/26/23 0659 12/26/23 0700 - 12/26/23 1859 12/26/23 1900 - 12/27/23 0659 12/27/23 0700 - 12/27/23 1245   Patient has no LDAs of requested type attached.        IMAGING     XR ABDOMEN 2 VIEWS   Final Result   Dilated small bowel loops in the abdomen and pelvis without significant movement in the contrast within the  colon. Findings suggest ongoing ileus versus bowel obstruction.      [name], MD 12/26/2023 2:55 PM      XR CHEST 1 VIEW   Final Result   NG tube extends below the diaphragm into the stomach.      [name], MD 12/25/2023 3:04 PM      XR ABDOMEN 2 VIEWS   Final Result   1.  Enteric tube side-port is at the level of the diaphragm. Recommend advancement approximately 5 cm.   2.  Apparent decreased distention of the stomach compared to the prior CT.   3.  Nonspecific and nonobstructive bowel gas pattern by radiograph. However, the small bowel loops are not well demonstrated by radiograph and may be fluid-filled. Enteric contrast is seen within large bowel loops in the lower mid abdomen.   4.  Redemonstration of a 1.6 cm stone at the left mid abdomen likely corresponding to the prior CT findings of a stone at the left renal pelvis.            [name] 12/25/2023 12:56 PM      XR CHEST 1 VIEW   Final Result      The nasogastric tube extends into the stomach.      [name], MD 12/24/2023 10:12 AM      CT ABDOMEN PELVIS W CONTRAST   Final Result   Small bowel obstruction with a transition point in the region of an enteroenteric anastomosis. There is significant air and fluid distention of the stomach with partially imaged fluid filled distention of the distal esophagus.      Increased now moderate left hydronephrosis with a new large stone in the left renal pelvis which measures up to 1.7 cm. Multiple punctate stones  within the dependent urinary bladder. Nonspecific mild-to-moderate distention of the urinary bladder.      [name] 12/24/2023 1:48 AM         MEDICATIONS   Scheduled Medications:  Scheduled Medications[1]    Continuous Infusion:  Continuous Medications[2]    PRN Medications:   PRN Medications[3]       Medications        Start Medication Dose/Rate, Route, Frequency Ordered Stop    12/27/23 1300 pantoprazole (ProtoNix) EC tablet 40 mg         40 mg, oral, Daily before breakfast 12/27/23 1244 --    12/27/23 0900 docusate sodium (Colace) capsule 100 mg         100 mg, oral, 2 times daily 12/27/23 0834 --    12/26/23 2200 amitriptyline (Elavil) tablet 50 mg         50 mg, oral, Nightly 12/26/23 0652 --    12/26/23 2200 mirtazapine (Remeron) tablet 15 mg         15 mg, oral, Nightly 12/26/23 0653 --    12/26/23 1545 prucalopride tablet 1 mg (Motegrity 1mg  tab) PATIENT OWN MED         1 mg, oral, Daily 12/26/23 1525 --    12/26/23 0900 levothyroxine (Synthroid, Levoxyl) tablet 112 mcg         112 mcg, oral, Daily 12/26/23 0652 --    12/26/23 0900 pantoprazole (ProtoNix) EC tablet 40 mg         40 mg, oral, 2 times daily 12/26/23 0653 --    12/26/23 0900 pregabalin (Lyrica) capsule 50 mg         50 mg, oral, Daily 12/26/23 0654 --    12/25/23 0913 morphine  injection 2 mg         2 mg, IV, Every 4 hours PRN 12/25/23 0913 12/27/23 0359    12/25/23 0730 sodium chloride 0.9 % infusion         100 mL/hr, IV, Continuous 12/25/23 0708 12/27/23 0807    12/25/23 0230 cefTRIAXone (Rocephin) 1 g in sodium chloride 0.9 % 100 mL IVPB-MBP         1 g, IV, Every 24 hours 12/24/23 1001 12/30/23 0229    12/24/23 1030 heparin (porcine) injection 5,000 Units         5,000 Units, subQ, Every 12 hours scheduled 12/24/23 1007 --    12/24/23 0959 ondansetron (Zofran) injection 4 mg         4 mg, IV, Every 6 hours PRN 12/24/23 0959 --    12/24/23 0405 sodium chloride 0.9 % infusion         100 mL/hr, IV, Continuous 12/24/23 0401 12/26/23 0404                     Current Medications[4]   ASSESSMENT AND PLAN      89 year old female H/o appendiceal ca, resection, sbo with resection, partial gastrectomy.  Presented with lower abdominal pain.  Patient complaining abdomen bloating and discomfort for 1 week and getting worse, accompanied with nausea.      #SBO- resolving   #SBO associated with prior surgical anastomosis, post procedure   #History of appendiceal cancer in 2020  #History of partial gastric resection  #hx chronic constipation, ileus  - General surgery was consulted and saw patient.   Initial CT with SBO, transition point in the region of  enteroenteric anastomosis. There is significant air and fluid distention of the stomach with partially imaged fluid filled distention of the distal esophagus.   - Was decompressed with NG tube, has since been removed  Repeat KUB reveals ileus  Started on clears 6/8.   Home dose Motegrity resumed-1mg . Family Is asking is should increase to 2mg  due to recent events. Previous dose caused constipation. She f/w GI at [facility]. 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   #Possible UTI, culture without growth  #Leukocytosis, resolved  CT Increased now moderate left hydronephrosis with a new large stone in the left renal pelvis which measures up to 1.7 cm. Multiple punctate stones within the dependent urinary bladder. Nonspecific mild-to-moderate distention of the urinary bladder.   Outpatient follow up with urology for discussion regarding left renal stone  Will d/c antibiotics as no growth      #Hypothyroidism   C/w Synthroid 112 mcg daily     #Anxiety  Mirtazapine 15 mg nightly  Elavil 50 mg nightly     #Nutrition  #Severe Protein-Calorie Malnutrition in the context of Chronic Illness.  Nutrition is following. Interventions include:  Nutritional Counseling/Education  Remeron 15 mg nightly  Dietary consult placed.  Dietary supplements added  Body mass index is 15.54 kg/m.       DVT Prophylaxis: seq  Code Status: Full Code   Diet/Nutrition: Adult diet Room service; Full liquid  DISPOSITION: Advance diet. Anticipate d/c in 24-48 hrs. Will need to tolerate diet and stool prior to d/c home.   Family updated as in the room                                [  1] amitriptyline, 50 mg, oral, Nightly  cefTRIAXone, 1 g, intravenous, q24h  docusate sodium, 100 mg, oral, BID  heparin (porcine), 5,000 Units, subcutaneous, q12h SCH  levothyroxine, 112 mcg, oral, Daily  mirtazapine, 15 mg, oral, Nightly  pantoprazole, 40 mg, oral, BID  pantoprazole, 40 mg, oral, Daily before breakfast  pregabalin, 50 mg, oral, Daily  prucalopride, 1 mg, oral, Daily  [2]    [3] PRN medications: ondansetron  [4]   Current Facility-Administered Medications:     amitriptyline (Elavil) tablet 50 mg, 50 mg, oral, Nightly, Tanya Fabiano, NP, 50 mg at 12/26/23 2100    cefTRIAXone (Rocephin) 1 g in sodium chloride 0.9 % 100 mL IVPB-MBP, 1 g, intravenous, q24h, Dessie Flow, MD, Last Rate: 200 mL/hr at 12/27/23 0243, 1 g at 12/27/23 0243    docusate sodium (Colace) capsule 100 mg, 100 mg, oral, BID, Cheryl Corporal, FNP, 100 mg at 12/27/23 0855    heparin (porcine) injection 5,000 Units, 5,000 Units, subcutaneous, q12h [facility], Yanli Fan, MD, 5,000 Units at 12/27/23 0856    levothyroxine (Synthroid, Levoxyl) tablet 112 mcg, 112 mcg, oral, Daily, Tanya Fabiano, NP, 112 mcg at 12/27/23 0855    mirtazapine (Remeron) tablet 15 mg, 15 mg, oral, Nightly, Tanya Fabiano, NP, 15 mg at 12/26/23 2100    ondansetron (Zofran) injection 4 mg, 4 mg, intravenous, q6h PRN, Dessie Flow, MD, 4 mg at 12/27/23 1610    pantoprazole (ProtoNix) EC tablet 40 mg, 40 mg, oral, BID, Tanya Fabiano, NP, 40 mg at 12/27/23 0855    pantoprazole (ProtoNix) EC tablet 40 mg, 40 mg, oral, Daily before breakfast, Cheryl Corporal, FNP     pregabalin (Lyrica) capsule 50 mg, 50 mg, oral, Daily, Tanya Fabiano, NP, 50 mg at 12/27/23 0855    prucalopride tablet 1 mg (Motegrity 1mg  tab) PATIENT OWN MED, 1 mg, oral, Daily, Tanya Fabiano, NP, 1 mg at 12/27/23 1019

## 2023-12-28 LAB — CBC WITH DIFFERENTIAL
Basophils %: 0.2 %
Basophils Absolute: 0.01 10*3/uL (ref 0.00–0.22)
Eosinophils %: 3.5 %
Eosinophils Absolute: 0.15 10*3/uL (ref 0.00–0.50)
Hematocrit: 35.1 % (ref 32.0–47.0)
Hemoglobin: 11 g/dL (ref 11.0–16.0)
Immature Granulocytes %: 0.2 %
Immature Granulocytes Absolute: 0.01 10*3/uL (ref 0.00–0.10)
Lymphocyte %: 34.6 %
Lymphocytes Absolute: 1.48 10*3/uL (ref 0.70–4.00)
MCH: 28 pg (ref 26.0–34.0)
MCHC: 31.3 g/dL (ref 31.0–37.0)
MCV: 89.3 fL (ref 80.0–100.0)
MPV: 11.6 fL (ref 9.1–12.4)
Monocytes %: 11.9 %
Monocytes Absolute: 0.51 10*3/uL (ref 0.36–0.77)
NRBC %: 0 % (ref 0.0–0.0)
NRBC Absolute: 0 10*3/uL (ref 0.00–2.00)
Neutrophil %: 49.6 %
Neutrophils Absolute: 2.12 10*3/uL (ref 1.50–7.95)
Platelets: 243 10*3/uL (ref 150–400)
RBC: 3.93 M/uL (ref 3.70–5.20)
RDW-CV: 14.3 % (ref 11.5–14.5)
RDW-SD: 46.2 fL (ref 35.0–51.0)
WBC: 4.3 10*3/uL (ref 4.0–11.0)

## 2023-12-28 LAB — COMPREHENSIVE METABOLIC PANEL
ALT: 27 U/L (ref 0–55)
AST: 19 U/L (ref 6–42)
Albumin: 2.2 g/dL — ABNORMAL LOW (ref 3.2–5.0)
Alkaline phosphatase: 138 U/L — ABNORMAL HIGH (ref 30–130)
Anion Gap: 4 mmol/L (ref 3–14)
BUN: 6 mg/dL (ref 6–24)
Bilirubin, total: 0.3 mg/dL (ref 0.2–1.2)
CO2 (Bicarbonate): 30 mmol/L (ref 20–32)
Calcium: 7.7 mg/dL — ABNORMAL LOW (ref 8.5–10.5)
Chloride: 107 mmol/L (ref 98–110)
Creatinine: 0.6 mg/dL (ref 0.55–1.30)
Glucose: 92 mg/dL (ref 70–110)
Potassium: 4.3 mmol/L (ref 3.6–5.2)
Protein, total: 4.7 g/dL — ABNORMAL LOW (ref 6.0–8.4)
Sodium: 141 mmol/L (ref 135–146)
eGFRcr: 87 mL/min/{1.73_m2} (ref >=60)

## 2023-12-28 MED ORDER — prucalopride tablet 1 mg <<pt own>>
1 | Freq: Once | ORAL | Status: AC
Start: 2023-12-28 — End: 2023-12-28
  Administered 2023-12-28: 20:00:00 1 mg via ORAL

## 2023-12-28 MED ORDER — pantoprazole (ProtoNix) EC tablet 40 mg
40 | Freq: Two times a day (BID) | ORAL | Status: DC
Start: 2023-12-28 — End: 2023-12-29
  Administered 2023-12-29 (×2): 40 mg via ORAL

## 2023-12-28 MED ORDER — prucalopride tablet 2 mg (Patient own med)
1 | Freq: Every day | ORAL | Status: DC
Start: 2023-12-28 — End: 2023-12-29
  Administered 2023-12-29: 12:00:00 2 mg via ORAL

## 2023-12-28 MED FILL — ONDANSETRON HCL (PF) 4 MG/2 ML INJECTION SOLUTION: 4 4 mg/2 mL | INTRAMUSCULAR | Qty: 2 | Fill #0

## 2023-12-28 MED FILL — DOCUSATE SODIUM 100 MG CAPSULE: 100 100 mg | ORAL | Qty: 1 | Fill #0

## 2023-12-28 MED FILL — LEVOTHYROXINE 112 MCG TABLET: 112 112 mcg | ORAL | Qty: 1 | Fill #0

## 2023-12-28 MED FILL — PANTOPRAZOLE 40 MG TABLET,DELAYED RELEASE: 40 40 mg | ORAL | Qty: 1 | Fill #0

## 2023-12-28 MED FILL — AMITRIPTYLINE 50 MG TABLET: 50 50 mg | ORAL | Qty: 1 | Fill #0

## 2023-12-28 MED FILL — PREGABALIN 25 MG CAPSULE: 25 25 mg | ORAL | Qty: 2 | Fill #0

## 2023-12-28 MED FILL — MIRTAZAPINE 15 MG TABLET: 15 15 mg | ORAL | Qty: 1 | Fill #0

## 2023-12-28 MED FILL — HEPARIN (PORCINE) 5,000 UNIT/ML INJECTION SOLUTION: 5000 5,000 unit/mL | INTRAMUSCULAR | Qty: 1 | Fill #0

## 2023-12-28 NOTE — Progress Notes (Signed)
 12/27/23 1231   Case Management Initial Assessment   Source of Information Patient   Type of Residence Apartment   Lives With Alone   Discharge Services Anticipated at this Time No   Patient Information   Interpreter Needs None   Support Systems Children/Family   Current Patient Responsibilities Work;Personal Care;Housekeeping;Shopping;Meal Prep;Driving;Laundry   PTA Functional Status   Stairs to [employer] 0   Current Home Treatment/Devices/Equipment [employer];Shower Chair   Current Functional Status   Hospital doctor Independent   Walking in Insurance claims handler Used None   Community Mobility Independent   IADL Assistance Needed No   Financial Information   Insurance Confirmed with Patient Yes   Legal Information   Does Patient Have HCP Yes   Discharge Planning   Patient/Family/Caregiver Discharge Preference Home   Anticipated Discharge Disposition Home   Next Actions   Assessment/High Risk Screening Complete     Case Management Progress Note:   46yr female admitted with SBO, Presented with Lower ABD pain, nausea, and bloating. CT ABD confirmed SBO and L Renal stone. Gen surgery  & Urology consulted.  DX SBO, ?UTI  NGT removed 6/8   Tolerating clear Liquids this am and will advance DAT.    Met with Pt Bedside, instructed in my role and verified contact info.  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 States family lives nearby. No needs anticipated at this time  CM to follow  Angelo Barge, RN  12/27/2023  12:35 PM    Case Management Progress Note:   Per rounds No BM yet. Full liquid diet.  Gen surg following  Ambulating hall  CM to follow  Angelo Barge, RN  12/28/2023  4:31 PM

## 2023-12-28 NOTE — Nursing Note (Signed)
 Vss denies pain . Amb well in hallways  tol fl breakfast (pudding,cream of wheat) voiding states no bm no abd pain med with zofran x 1 ather request with good effect no vomiting  declined lunch  np aware . Montegrity increased

## 2023-12-28 NOTE — Care Plan (Signed)
 Physical Therapy Deferral Note      Patient Name: [name]  MRN: [id number]  DOB: 03-Jul-1935    Deferral Reason: No skilled PT needs, Patient at baseline    PT consult received and chart reviewed: Pt is an 89 yr old female admitted to [facility] w/ SBO. Per discussion w/ RN pt ambulating hallways IND w/o AD. Pt reports feels at physical baseline and confident in abilities for completing household mobility and ADL's. No acute PT needs at this time. If any changes please re-consult.     Ara Bays, PT  [place] lic # [id number]

## 2023-12-28 NOTE — Progress Notes (Signed)
 GENERAL SURGERY  DAILY PROGRESS NOTE    PATIENTS NAME & MRN: [name], MRN: [id number]  ADMITTED ON: 12/23/2023  9:36 PM (4days)  LOCATION, DATE & TIME: 3304/3304-B, 12/28/2023 at 11:13 AM  AUTHOR: Hampton Levins, PA      HISTORY: [name] is a(n) 89 y.o. female who presented with Bowel obstruction (Multi-HCC).    Overnight events: no overnight events. Patient states she is doing well. No abdominal pain, no nausea or vomiting. Tolerating full liquid diet. Passing gas. Still no BM. ambulating  Current diet: Adult diet Room service; Fiber-restricted.      PHYSICAL EXAM  VS: BP (!) 157/82 (BP Location: Right arm, Patient Position: Lying)   Pulse 83   Temp 36.5 C (97.7 F) (Oral)   Resp 17   Ht 1.6 m   Wt 39.8 kg   SpO2 96%   BMI 15.54 kg/m     Physical Exam    General: NAD.  Heart: No LE edema, normal rate  Lungs: No stridor, breathing comfortably.  Abdomen: Soft, ND, NT.  Extremities: Warm, soft and NT.    I&O FLOWSHEET DATA  No intake/output data recorded.      RECENT LABS  Results from last 7 days   Lab Units 12/28/23  0558 12/27/23  0543 12/26/23  0654 12/25/23  0732 12/23/23  2255   HEMATOCRIT % 35.1 36.4 33.9 33.4 36.1   HEMOGLOBIN g/dL 14.7 82.9 56.2* 13.0* 86.5   WBC AUTO K/uL 4.3 6.5 6.2 6.9 23.4*   PLATELETS AUTO K/uL 243 291 258 245 343      Results from last 7 days   Lab Units 12/28/23  0558 12/27/23  0543 12/26/23  0654 12/25/23  0732 12/23/23  2157   GLUCOSE mg/dL 92 89 57* 80 784*   CALCIUM mg/dL 7.7* 7.9* 8.1* 8.2* 7.8*   SODIUM mmol/L 141 141 141 143 135   POTASSIUM mmol/L 4.3 4.6 3.8 4.8 4.1   CO2 mmol/L 30 27 19* 22 17*   CHLORIDE mmol/L 107 110 113* 118* 107   BUN mg/dL 6 14 21  26* 36*   CREATININE mg/dL 6.96 2.95 2.84* 1.32* 4.40           Results from last 7 days   Lab Units 12/28/23  0558 12/27/23  0543 12/26/23  0654 12/23/23  2157   ALT U/L 27 29 30  45   AST U/L 19 24 29  59*   ALK PHOS U/L 138* 161* 170* 248*   BILIRUBIN TOTAL mg/dL 0.3 0.3 0.2 0.5       IMAGING  No new  imaging        ASSESSMENT/Plan:   [name] is a(n) 89 y.o. female who presented with Bowel obstruction (Multi-HCC). Patient doing well. Improving. No nausea today. Patient started yesterday on her montegrity. Tolerating full liquid diet. Still no BM. Ok to advance to low res diet. Will continue to monitor. Would like patient to move her bowels before discharge. Continue ambulating.             Discussed with Dr. Roseann Condon, PA  General Surgery    I saw and evaluated the patient, participating in the key portions of the service.  I reviewed the Surgical PA's note.  I agree with the findings and plan.     Keven Pel, MD

## 2023-12-28 NOTE — Progress Notes (Signed)
 [facility] - [address], [place] [address]    Progress Note    Name: [name]  MRN: [id number]    SUBJECTIVE   Patient seen this morning. Reviewed with nursing staff and surgery PA.   Passing flatus but no BM. Tolerating full liquid diet.   Has been ambulating in hallway independently     REVIEW OF SYSTEMS   ROS reviewed and negative unless stated in HPI   VITALS     Vitals:    12/28/23 0441   BP: (!) 157/82   Pulse: 83   Resp: 17   Temp: 36.5 C (97.7 F)   SpO2: 96%          Intake/Output Summary (Last 24 hours) at 12/28/2023 1235  Last data filed at 12/28/2023 0800  Gross per 24 hour   Intake 200 ml   Output --   Net 200 ml       PHYSICAL EXAMINATION   General Appearance: NAD   HEENT: EOMI  Resp: clear  Cardiac: s1s12  Abdomen: Non-distended, non-tender, +BSx4   Extremitites: No edema  Musculoskeletal: Normal ROM  Skin: No jaundice, no visible lesions  Psych: normal affect  Neuro: Alert, oriented  LABS/MICROBIOLOGY     Results from last 7 days   Lab Units 12/28/23  0558 12/27/23  0543 12/26/23  0654   WBC AUTO K/uL 4.3 6.5 6.2   HEMOGLOBIN g/dL 96.2 95.2 84.1*   HEMATOCRIT % 35.1 36.4 33.9   PLATELETS AUTO K/uL 243 291 258   SODIUM mmol/L 141 141 141   POTASSIUM mmol/L 4.3 4.6 3.8   CHLORIDE mmol/L 107 110 113*   CO2 mmol/L 30 27 19*   BUN mg/dL 6 14 21    CREATININE mg/dL 3.24 4.01 0.27*   ANION GAP mmol/L 4 4 9    CALCIUM mg/dL 7.7* 7.9* 8.1*   ALT U/L 27 29 30    AST U/L 19 24 29    ALK PHOS U/L 138* 161* 170*   BILIRUBIN TOTAL mg/dL 0.3 0.3 0.2       Results       Procedure Component Value Units Date/Time    Urine culture [[id number]] Collected: 12/24/23 0215    Order Status: Completed Specimen: Urine, Clean Catch Updated: 12/26/23 1038     Urine Culture No growth            Output by Drain (mL) 12/26/23 0700 - 12/26/23 1859 12/26/23 1900 - 12/27/23 0659 12/27/23 0700 - 12/27/23 1859 12/27/23 1900 - 12/28/23 0659 12/28/23 0700 - 12/28/23 1235   Patient has no LDAs of requested type  attached.        IMAGING     XR ABDOMEN 2 VIEWS   Final Result   Dilated small bowel loops in the abdomen and pelvis without significant movement in the contrast within the colon. Findings suggest ongoing ileus versus bowel obstruction.      [name], MD 12/26/2023 2:55 PM      XR CHEST 1 VIEW   Final Result   NG tube extends below the diaphragm into the stomach.      [name], MD 12/25/2023 3:04 PM      XR ABDOMEN 2 VIEWS   Final Result   1.  Enteric tube side-port is at the level of the diaphragm. Recommend advancement approximately 5 cm.   2.  Apparent decreased distention of the stomach compared to the prior CT.   3.  Nonspecific and nonobstructive  bowel gas pattern by radiograph. However, the small bowel loops are not well demonstrated by radiograph and may be fluid-filled. Enteric contrast is seen within large bowel loops in the lower mid abdomen.   4.  Redemonstration of a 1.6 cm stone at the left mid abdomen likely corresponding to the prior CT findings of a stone at the left renal pelvis.            [name] 12/25/2023 12:56 PM      XR CHEST 1 VIEW   Final Result      The nasogastric tube extends into the stomach.      [name], MD 12/24/2023 10:12 AM      CT ABDOMEN PELVIS W CONTRAST   Final Result   Small bowel obstruction with a transition point in the region of an enteroenteric anastomosis. There is significant air and fluid distention of the stomach with partially imaged fluid filled distention of the distal esophagus.      Increased now moderate left hydronephrosis with a new large stone in the left renal pelvis which measures up to 1.7 cm. Multiple punctate stones within the dependent urinary bladder. Nonspecific mild-to-moderate distention of the urinary bladder.      [name] 12/24/2023 1:48 AM         MEDICATIONS   Scheduled Medications:  Scheduled Medications[1]    Continuous Infusion:  Continuous Medications[2]    PRN Medications:   PRN Medications[3]       Medications        Start  Medication Dose/Rate, Route, Frequency Ordered Stop    12/28/23 2100 pantoprazole (ProtoNix) EC tablet 40 mg         40 mg, oral, 2 times daily 12/28/23 0847 --    12/27/23 0900 docusate sodium (Colace) capsule 100 mg         100 mg, oral, 2 times daily 12/27/23 0834 --    12/26/23 2200 amitriptyline (Elavil) tablet 50 mg         50 mg, oral, Nightly 12/26/23 0652 --    12/26/23 2200 mirtazapine (Remeron) tablet 15 mg         15 mg, oral, Nightly 12/26/23 0653 --    12/26/23 1545 prucalopride tablet 1 mg (Motegrity 1mg  tab) PATIENT OWN MED         1 mg, oral, Daily 12/26/23 1525 --    12/26/23 0900 levothyroxine (Synthroid, Levoxyl) tablet 112 mcg         112 mcg, oral, Daily 12/26/23 0652 --    12/26/23 0900 pregabalin (Lyrica) capsule 50 mg         50 mg, oral, Daily 12/26/23 0654 --    12/25/23 0913 morphine injection 2 mg         2 mg, IV, Every 4 hours PRN 12/25/23 0913 12/27/23 0359    12/25/23 0730 sodium chloride 0.9 % infusion         100 mL/hr, IV, Continuous 12/25/23 0708 12/27/23 0807    12/24/23 1030 heparin (porcine) injection 5,000 Units         5,000 Units, subQ, Every 12 hours scheduled 12/24/23 1007 --    12/24/23 0959 ondansetron (Zofran) injection 4 mg         4 mg, IV, Every 6 hours PRN 12/24/23 0959 --    12/24/23 0405 sodium chloride 0.9 % infusion         100 mL/hr, IV, Continuous 12/24/23 0401 12/26/23 0404  Current Medications[4]   ASSESSMENT AND PLAN    89 year old female H/o appendiceal ca, resection, sbo with resection, partial gastrectomy.  Presented with lower abdominal pain.  Patient complaining abdomen bloating and discomfort for 1 week and getting worse, accompanied with nausea.      #SBO- resolving   #SBO associated with prior surgical anastomosis, post procedure   #History of appendiceal cancer in 2020  #History of partial gastric resection  #hx chronic constipation, ileus  - General surgery was consulted and saw patient.   Initial CT with SBO, transition point  in the region of  enteroenteric anastomosis. There is significant air and fluid distention of the stomach with partially imaged fluid filled distention of the distal esophagus.   - Was decompressed with NG tube, has since been removed  Repeat KUB reveals ileus  Started on clears 6/8.   Home dose Motegrity resumed-1mg . Family Is asking is should increase to 2mg  due to recent events. Previous dose caused constipation. She f/w GI at [facility]. I spoke with on call GI today, rec to remain on home dose for now  Diet has been advancing. Now on regular diet.  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   #Possible UTI, culture without growth  #Leukocytosis, resolved  CT Increased now moderate left hydronephrosis with a new large stone in the left renal pelvis which measures up to 1.7 cm. Multiple punctate stones within the dependent urinary bladder. Nonspecific mild-to-moderate distention of the urinary bladder.   Outpatient follow up with urology for discussion regarding left renal stone  Will d/c antibiotics as no growth 6/9  Needs outpatient Urology f/u      #Hypothyroidism   C/w Synthroid 112 mcg daily     #Anxiety  Mirtazapine 15 mg nightly  Elavil 50 mg nightly     #Nutrition  #Severe Protein-Calorie Malnutrition in the context of Chronic Illness.  Nutrition is following. Interventions include: Nutritional Counseling/Education  Remeron 15 mg nightly  Dietary consult placed.  Dietary supplements added  Body mass index is 15.54 kg/m.         DVT Prophylaxis: seq  Code Status: Full Code   Diet/Nutrition: Adult diet Room service; Fiber-restricted  DISPOSITION: Diet advanced to regular today   Needs to move BM prior to d/c                     [1] amitriptyline, 50 mg, oral, Nightly  docusate sodium, 100 mg, oral, BID  heparin (porcine), 5,000 Units, subcutaneous, q12h  [facility]  levothyroxine, 112 mcg, oral, Daily  mirtazapine, 15 mg, oral, Nightly  pantoprazole, 40 mg, oral, BID  pregabalin, 50 mg, oral, Daily  prucalopride, 1 mg, oral, Daily  [2]    [3] PRN medications: ondansetron  [4]   Current Facility-Administered Medications:     amitriptyline (Elavil) tablet 50 mg, 50 mg, oral, Nightly, Tanya Fabiano, NP, 50 mg at 12/27/23 2026    docusate sodium (Colace) capsule 100 mg, 100 mg, oral, BID, Cheryl Corporal, FNP, 100 mg at 12/28/23 0835    heparin (porcine) injection 5,000 Units, 5,000 Units, subcutaneous, q12h [facility], Yanli Fan, MD, 5,000 Units at 12/28/23 0836    levothyroxine (Synthroid, Levoxyl) tablet 112 mcg, 112 mcg, oral, Daily, Tanya Fabiano, NP, 112 mcg at 12/28/23 0835  mirtazapine (Remeron) tablet 15 mg, 15 mg, oral, Nightly, Tanya Fabiano, NP, 15 mg at 12/27/23 2026    ondansetron (Zofran) injection 4 mg, 4 mg, intravenous, q6h PRN, Dessie Flow, MD, 4 mg at 12/28/23 1220    pantoprazole (ProtoNix) EC tablet 40 mg, 40 mg, oral, BID, Cheryl Corporal, FNP    pregabalin (Lyrica) capsule 50 mg, 50 mg, oral, Daily, Tanya Fabiano, NP, 50 mg at 12/28/23 0835    prucalopride tablet 1 mg (Motegrity 1mg  tab) PATIENT OWN MED, 1 mg, oral, Daily, Tanya Fabiano, NP, 1 mg at 12/28/23 [phone]

## 2023-12-29 LAB — CBC WITH DIFFERENTIAL
Basophils %: 0.2 %
Basophils Absolute: 0.01 10*3/uL (ref 0.00–0.22)
Eosinophils %: 4.1 %
Eosinophils Absolute: 0.19 10*3/uL (ref 0.00–0.50)
Hematocrit: 34.4 % (ref 32.0–47.0)
Hemoglobin: 10.7 g/dL — ABNORMAL LOW (ref 11.0–16.0)
Immature Granulocytes %: 0.2 %
Immature Granulocytes Absolute: 0.01 10*3/uL (ref 0.00–0.10)
Lymphocyte %: 29.5 %
Lymphocytes Absolute: 1.36 10*3/uL (ref 0.70–4.00)
MCH: 27.7 pg (ref 26.0–34.0)
MCHC: 31.1 g/dL (ref 31.0–37.0)
MCV: 89.1 fL (ref 80.0–100.0)
MPV: 12 fL (ref 9.1–12.4)
Monocytes %: 14.1 %
Monocytes Absolute: 0.65 10*3/uL (ref 0.36–0.77)
NRBC %: 0 % (ref 0.0–0.0)
NRBC Absolute: 0 10*3/uL (ref 0.00–2.00)
Neutrophil %: 51.9 %
Neutrophils Absolute: 2.39 10*3/uL (ref 1.50–7.95)
Platelets: 222 10*3/uL (ref 150–400)
RBC: 3.86 M/uL (ref 3.70–5.20)
RDW-CV: 14.3 % (ref 11.5–14.5)
RDW-SD: 46.2 fL (ref 35.0–51.0)
WBC: 4.6 10*3/uL (ref 4.0–11.0)

## 2023-12-29 LAB — COMPREHENSIVE METABOLIC PANEL
ALT: 23 U/L (ref 0–55)
AST: 14 U/L (ref 6–42)
Albumin: 2.2 g/dL — ABNORMAL LOW (ref 3.2–5.0)
Alkaline phosphatase: 126 U/L (ref 30–130)
Anion Gap: 4 mmol/L (ref 3–14)
BUN: 7 mg/dL (ref 6–24)
Bilirubin, total: 0.2 mg/dL (ref 0.2–1.2)
CO2 (Bicarbonate): 30 mmol/L (ref 20–32)
Calcium: 7.7 mg/dL — ABNORMAL LOW (ref 8.5–10.5)
Chloride: 106 mmol/L (ref 98–110)
Creatinine: 0.49 mg/dL — ABNORMAL LOW (ref 0.55–1.30)
Glucose: 88 mg/dL (ref 70–110)
Potassium: 3.7 mmol/L (ref 3.6–5.2)
Protein, total: 4.6 g/dL — ABNORMAL LOW (ref 6.0–8.4)
Sodium: 140 mmol/L (ref 135–146)
eGFRcr: 91 mL/min/{1.73_m2} (ref >=60)

## 2023-12-29 MED ORDER — bisacodyl (Dulcolax) 5 mg EC tablet
5 | ORAL_TABLET | Freq: Every day | ORAL | 0 refills | 12.00000 days | Status: AC | PRN
Start: 2023-12-29 — End: 2024-01-08

## 2023-12-29 MED ORDER — bisacodyl (Dulcolax) EC tablet 5 mg
5 | Freq: Every day | ORAL | Status: DC | PRN
Start: 2023-12-29 — End: 2023-12-29
  Administered 2023-12-29: 12:00:00 5 mg via ORAL

## 2023-12-29 MED ORDER — docusate sodium (Colace) 100 mg capsule
100 | ORAL_CAPSULE | Freq: Two times a day (BID) | ORAL | 0 refills | 30.00000 days | Status: DC
Start: 2023-12-29 — End: 2024-02-02

## 2023-12-29 MED FILL — PANTOPRAZOLE 40 MG TABLET,DELAYED RELEASE: 40 40 mg | ORAL | Qty: 1 | Fill #0

## 2023-12-29 MED FILL — DOCUSATE SODIUM 100 MG CAPSULE: 100 100 mg | ORAL | Qty: 1 | Fill #0

## 2023-12-29 MED FILL — AMITRIPTYLINE 50 MG TABLET: 50 50 mg | ORAL | Qty: 1 | Fill #0

## 2023-12-29 MED FILL — BISACODYL 5 MG TABLET,DELAYED RELEASE: 5 5 mg | ORAL | Qty: 1 | Fill #0

## 2023-12-29 MED FILL — LEVOTHYROXINE 112 MCG TABLET: 112 112 mcg | ORAL | Qty: 1 | Fill #0

## 2023-12-29 MED FILL — MIRTAZAPINE 15 MG TABLET: 15 15 mg | ORAL | Qty: 1 | Fill #0

## 2023-12-29 MED FILL — HEPARIN (PORCINE) 5,000 UNIT/ML INJECTION SOLUTION: 5000 5,000 unit/mL | INTRAMUSCULAR | Qty: 1 | Fill #0

## 2023-12-29 MED FILL — PREGABALIN 25 MG CAPSULE: 25 25 mg | ORAL | Qty: 2 | Fill #0

## 2023-12-29 NOTE — Progress Notes (Signed)
 12/29/23 1212   Case Management Discharge Note    Ambulance Log  Ambulance not needed     Case Management Progress Note:   Potential d/c today if has a BM.  No DME or COC needs identified  To f/u with PCP as instructed  Angelo Barge, RN  12/29/2023  12:13 PM

## 2023-12-29 NOTE — Discharge Instructions (Signed)
LOW FIBER

## 2023-12-29 NOTE — Discharge Summary (Signed)
 Discharge Summary    Discharge Diagnosis  Bowel obstruction (Multi-HCC)  Chronic Illness - Malnutrition Status: Severe malnutrition  SBO- resolving   #SBO associated with prior surgical anastomosis, post procedure   #History of appendiceal cancer in 2020  #History of partial gastric resection  #hx chronic constipation, ileus  Hospital Course   89 year old female H/o appendiceal ca, resection, sbo with resection, partial gastrectomy.  Presented with lower abdominal pain.  Patient complaining abdomen bloating and discomfort for 1 week and getting worse, accompanied with nausea.      #SBO- resolving   #SBO associated with prior surgical anastomosis, post procedure   #History of appendiceal cancer in 2020  #History of partial gastric resection  #hx chronic constipation, ileus  - General surgery was consulted and saw patient.   Initial CT with SBO, transition point in the region of  enteroenteric anastomosis. There is significant air and fluid distention of the stomach with partially imaged fluid filled distention of the distal esophagus.   - Was decompressed with NG tube, has since been removed  Repeat KUB reveals ileus  Started on clears 6/8.   Home dose Motegrity  resumed-1mg . Family Is asking is should increase to 2mg  due to recent events. Previous dose caused constipation. She f/w GI at [facility]. Preceding provider spoke with on call GI yesterday rec to remain on home dose for now  Diet has been advancing. Now on regular diet.    6/11- Patient tolerating regular diet without any nausea or vomiting able to pass flatus   Did not have bowel movement yet able to ambulate  Evaluated by surgery  patient does not necessarily have to wait for bowel movement.  Patient medically cleared for  discharge      #Moderate left hydronephrosis with a new large stone in the left renal pelvis  #1.7cm left renal pelvis stone with likely chronic UPJ obstruction on CT.   #Possible UTI, culture without growth  #Leukocytosis, resolved  CT  Increased now moderate left hydronephrosis with a new large stone in the left renal pelvis which measures up to 1.7 cm. Multiple punctate stones within the dependent urinary bladder. Nonspecific mild-to-moderate distention of the urinary bladder.   Outpatient follow up with urology for discussion regarding left renal stone  Will d/c antibiotics as no growth 6/9  Needs outpatient Urology f/u      #Hypothyroidism   C/w Synthroid  112 mcg daily     #Anxiety  Mirtazapine  15 mg nightly  Elavil  50 mg nightly     #Nutrition  #Severe Protein-Calorie Malnutrition in the context of Chronic Illness.  Nutrition is following. Interventions include: Nutritional Counseling/Education  Remeron  15 mg nightly  Dietary consult placed.  Dietary supplements added  Body mass index is 15.54 kg/m.            DVT Prophylaxis: seq  Code Status: Full Code   Diet/Nutrition: Adult diet Room service; Fiber-restricted    pt medicaly cleared for discharge today.    Pertinent Physical Exam At Time of Discharge  Physical Exam  eneral: NAD.  Heart: No LE edema, normal rate  Lungs: No stridor, breathing comfortably.  Abdomen: Soft, ND, NT.  Extremities: Warm, soft and NT.  Issues Requiring Follow-Up        f/u PCP in  1 week       Pt  with Increased now moderate left hydronephrosis with a new large stone in the left renal pelvis which measures up to 1.7 cm. Multiple punctate stones within the dependent urinary  bladder. Nonspecific mild-to-moderate distention of the urinary bladder.   Outpatient follow up with urology for discussion regarding left renal stone     Outpatient Follow-Up  Future Appointments   Date Time Provider Department Center   02/02/2024 11:00 AM Deward KANDICE Cotta, MD [facility] [place]

## 2023-12-29 NOTE — Nursing Note (Addendum)
 During hand off day RN reports pt is discharged and all set to go.  She is waiting for her family to come pick her up.  The pt is resting comf in bed.  She is alert and awake.  She denies any c/o pain or discomfort.  She reports eating lunch and passing flatus.  She states she has not had a BM.  A message was sent to Dr Celeste Cola.  He is aware she has not had a BM.

## 2023-12-29 NOTE — Discharge Instructions (Signed)
 RETURN TO USUAL ACTIVITY

## 2023-12-29 NOTE — Discharge Summary (Incomplete)
 Discharge Summary    Discharge Diagnosis  Bowel obstruction (Multi-HCC)    Hospital Course    ***    Test Results Pending At Discharge    Pertinent Physical Exam At Time of Discharge  Physical Exam    Issues Requiring Follow-Up  ***    This patient has a malnutrition diagnosis. The discharging provider agrees that the diagnosis is clinically relevant and recommends the below nutrition intrevention(s).  Nutrition Diagnosis: Malnutrition        Chronic Illness - Malnutrition Status: Severe malnutrition  {Nutrition Interventions:40717}                                Outpatient Follow-Up  Future Appointments   Date Time Provider Department Center   02/02/2024 11:00 AM Milagros Alf, MD [facility] [facility] [place]

## 2023-12-29 NOTE — Discharge Instructions (Addendum)
-  f/u PCP in  1 week       Pt  with Increased now moderate left hydronephrosis with a new large stone in the left renal pelvis which measures up to 1.7 cm. Multiple punctate stones within the dependent urinary bladder. Nonspecific mild-to-moderate distention of the urinary bladder.   Outpatient follow up with urology for discussion regarding left renal stone

## 2023-12-29 NOTE — Progress Notes (Addendum)
 GENERAL SURGERY  DAILY PROGRESS NOTE    PATIENTS NAME & MRN: [name], MRN: [id number]  ADMITTED ON: 12/23/2023  9:36 PM (5days)  LOCATION, DATE & TIME: 3304/3304-B, 12/29/2023 at 8:06 AM  AUTHOR: Hampton Levins, PA      HISTORY: [name] is a(n) 89 y.o. female who presented with Bowel obstruction (Multi-HCC).    Overnight events: no overnight events. Patient states she is doing well. Tolerating her low fiber diet. No nausea or vomiting. Passing gas. No BM yet. Patient ambulating the hallways   Current diet: Adult diet Room service; Fiber-restricted.      PHYSICAL EXAM  VS: BP (!) 150/77 (BP Location: Right arm, Patient Position: Lying)   Pulse 80   Temp 36.8 C (98.2 F) (Oral)   Resp 18   Ht 1.6 m   Wt 39.8 kg   SpO2 96%   BMI 15.54 kg/m     Physical Exam    General: NAD.  Heart: No LE edema, normal rate  Lungs: No stridor, breathing comfortably.  Abdomen: Soft, ND, NT.  Extremities: Warm, soft and NT.    I&O FLOWSHEET DATA  I/O last 3 completed shifts:  In: 320 (8 mL/kg) [P.O.:320]  Out: - (0 mL/kg)   Weight: 39.8 kg       RECENT LABS  Results from last 7 days   Lab Units 12/29/23  0544 12/28/23  0558 12/27/23  0543 12/26/23  0654 12/25/23  0732   HEMATOCRIT % 34.4 35.1 36.4 33.9 33.4   HEMOGLOBIN g/dL 54.0* 98.1 19.1 47.8* 29.5*   WBC AUTO K/uL 4.6 4.3 6.5 6.2 6.9   PLATELETS AUTO K/uL 222 243 291 258 245      Results from last 7 days   Lab Units 12/29/23  0544 12/28/23  0558 12/27/23  0543 12/26/23  0654 12/25/23  0732   GLUCOSE mg/dL 88 92 89 57* 80   CALCIUM mg/dL 7.7* 7.7* 7.9* 8.1* 8.2*   SODIUM mmol/L 140 141 141 141 143   POTASSIUM mmol/L 3.7 4.3 4.6 3.8 4.8   CO2 mmol/L 30 30 27  19* 22   CHLORIDE mmol/L 106 107 110 113* 118*   BUN mg/dL 7 6 14 21  26*   CREATININE mg/dL 6.21* 3.08 6.57 8.46* 9.62*           Results from last 7 days   Lab Units 12/29/23  0544 12/28/23  0558 12/27/23  0543 12/26/23  0654 12/23/23  2157   ALT U/L 23 27 29 30  45   AST U/L 14 19 24 29  59*   ALK PHOS U/L  126 138* 161* 170* 248*   BILIRUBIN TOTAL mg/dL 0.2 0.3 0.3 0.2 0.5       IMAGING  No new imaging        ASSESSMENT/Plan:   [name] is a(n) 89 y.o. female who presented with Bowel obstruction (Multi-HCC). Patient currently passing gas. She is on a low fiber diet, tolerating it with no nausea or vomiting. Recommend trying dulcolax since she is passing gas. If patient able to move her bowels ok. For her to go home.        Discussed with Dr. Roseann Condon, PA  General Surgery    I saw and evaluated the patient, participating in the key portions of the service.  I reviewed the Surgical PA's note.  I agree with the findings and plan.  Patient still having bowel function as evidenced by  flatus.  Nausea/vomiting resolved.  Okay for discharge now.  Does not necessarily need to wait until bowel movement.    Keven Pel, MD

## 2023-12-29 NOTE — Discharge Planning (AHS/AVS) (Signed)
 INCREASED ABOMINAL PAIN  NAUSEA/VOMITTING  FEVER GREATER THAN 100.4  INABILITY TO HAVE A BOWEL MOVEMENT AT HOME

## 2023-12-29 NOTE — Nursing Note (Signed)
 Pts daughter here to pick her up.  Discharge instructions reviewed regarding bowel obstruction.  Meds reviewed.  Daughter asking about follow up regarding the kidney stone that was seen on admission.  Dr Marguerita Shih tiger texted about follow up regarding kidney stone.  Waiting for response.

## 2023-12-29 NOTE — Nursing Note (Addendum)
 Pt to follow up with her PCP regarding kidney stone per Dr Curlene Dotter.  Non urgent follow up out pt per Dr Alanna Alley.  Pt and daughter aware.  Pt left with daughter via w/c.

## 2023-12-29 NOTE — Care Plan (Signed)
 Goal Outcome Evaluation:

## 2023-12-31 NOTE — Telephone Encounter (Signed)
 Is looking for HFU with you only for next week. Is there anywhere we can put her?

## 2024-01-03 NOTE — Telephone Encounter (Signed)
 Coming in tomorrow.

## 2024-01-03 NOTE — Telephone Encounter (Signed)
 LMTCB

## 2024-01-04 ENCOUNTER — Ambulatory Visit: Admit: 2024-01-04 | Discharge: 2024-01-04 | Payer: MEDICARE | Attending: Family Medicine | Primary: Family Medicine

## 2024-01-04 DIAGNOSIS — K56609 Unspecified intestinal obstruction, unspecified as to partial versus complete obstruction: Secondary | ICD-10-CM

## 2024-01-04 MED ORDER — ondansetron (Zofran) 4 mg tablet
4 | ORAL_TABLET | Freq: Three times a day (TID) | ORAL | 1 refills | 7.00000 days | Status: DC | PRN
Start: 2024-01-04 — End: 2024-03-29

## 2024-01-04 NOTE — Progress Notes (Signed)
 [facility]  [address]  [address]  [place] [address]  Dept: [phone]  Dept Fax: [phone]     Patient ID: [name] is a 89 y.o. female who presents for post hospital dischage  Bowel Obstruction (Feeling tired but has nausea quite often).    Subjective   HPI  Here in follow up of recent [facility] stay for SBO.   89  y.o. 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    Has been stable and doing well for several years since her last hospital stay.   She developed nausea and vomiting,  abdominal pain.      Evaluated with CT scan: Small bowel obstruction with a transition point in the region of an enteroenteric anastomosis. There is significant air and fluid distention of the stomach with partially imaged fluid filled distention of the distal esophagus.    CT also showed  left large renal stone with hydronephrosis.   She was decompressed with NG tube and tx with IV fluids and antiemetics.   Discharged home.    She still has nausea and fatigue.   No fever.  No vomiting.   Has had BM and passing gas.    No dysuria or hematuria.          Current Outpatient Medications   Medication Instructions    amitriptyline (ELAVIL) 50 mg, oral, Nightly    bisacodyl (DULCOLAX) 5 mg, oral, Daily PRN, Do not crush, chew, or split.    docusate sodium (COLACE) 100 mg, oral, 2 times daily    levothyroxine (SYNTHROID, LEVOXYL) 112 mcg, oral, Daily    mirtazapine (Remeron) 15 mg tablet TAKE 1 TABLET BY MOUTH EVERYDAY AT BEDTIME    Motegrity 1 mg, oral, Daily    multivitamin tablet 1 tablet, Daily    ondansetron (ZOFRAN) 4 mg, oral, Every 8 hours PRN    pantoprazole (PROTONIX) 40 mg, oral, 2 times daily, Do not crush, chew, or split.    pregabalin (LYRICA) 50 mg, oral, Daily   All: none    soc:  no smoking, no alcohol.    Still very functional for her age . Works at [employer]  Review of Systems   Constitutional: Negative.    HENT:  Positive for hearing loss.    Eyes: Negative.    Respiratory: Negative.     Cardiovascular: Negative.    Gastrointestinal:  Positive for constipation and nausea.   Genitourinary: Negative.    Musculoskeletal: Negative.    Skin: Negative.    Neurological:  Positive for weakness.        Objective   Visit Vitals  BP 108/64   Temp 37.1 C (98.7 F)   Wt 40.4 kg   BMI 15.77 kg/m   OB Status Postmenopausal   BSA 1.34 m     Physical Exam.  BP 108/64   Temp 37.1 C (98.7 F)   Wt 40.4 kg   BMI 15.77 kg/m     General Appearance:    Alert, cooperative, no distress,  thin.   Mentally sharp   Head:    Normocephalic, without obvious abnormality, atraumatic   Eyes:    PERRL, conjunctiva/corneas clear, EOM's intact, fundi     benign, both eyes   Ears:    Normal TM's and external ear canals, both ears   Nose:   Nares  normal, septum midline, mucosa normal, no drainage     or sinus tenderness   Throat:   Lips, mucosa, and tongue normal; teeth and gums normal   Neck:   Supple, symmetrical, trachea midline, no adenopathy;     thyroid:  no enlargement/tenderness/nodules; no carotid    bruit or JVD   Back:     Symmetric, no curvature, ROM normal, no CVA tenderness   Lungs:     Clear to auscultation bilaterally, respirations unlabored   Chest Wall:    No tenderness or deformity    Heart:    Regular rate and rhythm, S1 and S2 normal, no murmur, rub    or gallop   Breast Exam:       Abdomen:     Soft, non-tender, bowel sounds active all four quadrants,     no masses, no organomegaly   Genitalia:     Rectal:     Extremities:   Extremities normal, atraumatic, no cyanosis or edema   Pulses:   2+ and symmetric all extremities   Skin:   Skin color, texture, turgor normal, no rashes or lesions   Lymph nodes:   Cervical, supraclavicular, and axillary nodes normal   Neurologic:   CNII-XII intact, normal strength, sensation and reflexes     throughout        Assessment/Plan    [name] was seen today for post hospital dischage  bowel obstruction.  Small bowel obstruction (Multi-HCC)  Comments:  related to prior surgery.   improved with NG tube and supportive measures.  recheck labs, KUB. encourage hydration and nutrition  Orders:  -     CBC and differential - WAM and non-WAM; Future  -     Basic metabolic panel; Future  -     XR ABDOMEN 1 VIEW; Future  Nausea  Comments:  zofran prn  Orders:  -     ondansetron (Zofran) 4 mg tablet; Take 1 tablet (4 mg) by mouth every 8 (eight) hours if needed for nausea or vomiting.  Renal stone  Comments:  left with hydronephrosis.  recheck renal function and UA.  Orders:  -     Urinalysis; Future  Dysmotility of stomach  Comments:  has done well on motegrity for the past few years.  restart  Orders:  -     [facility]  [facility]; Future

## 2024-01-05 ENCOUNTER — Inpatient Hospital Stay: Admit: 2024-01-05 | Payer: MEDICARE | Primary: Family Medicine

## 2024-01-05 ENCOUNTER — Inpatient Hospital Stay: Discharge status: Absent without leave | Payer: MEDICARE | Primary: Family Medicine

## 2024-01-05 DIAGNOSIS — K56609 Unspecified intestinal obstruction, unspecified as to partial versus complete obstruction: Principal | ICD-10-CM

## 2024-01-05 DIAGNOSIS — R079 Chest pain, unspecified: Secondary | ICD-10-CM

## 2024-01-06 LAB — CBC W/DIFF
Baso Abs: 0 10*3/uL (ref 0.0–0.2)
Basos: 0 %
Eos Abs: 0.1 10*3/uL (ref 0.0–0.4)
Eos: 1 %
Hct: 35.7 % (ref 34.0–46.6)
Hgb: 11.1 g/dL (ref 11.1–15.9)
Immature Grans Abs: 0 10*3/uL (ref 0.0–0.1)
Immature Granulocytes: 0 %
Lymphs Abs: 1.2 10*3/uL (ref 0.7–3.1)
Lymphs: 19 %
MCH: 28.5 pg (ref 26.6–33.0)
MCHC: 31.1 g/dL — ABNORMAL LOW (ref 31.5–35.7)
MCV: 92 fL (ref 79–97)
Monocytes: 11 %
MonocytesAbs: 0.7 10*3/uL (ref 0.1–0.9)
Neutrophils Abs: 4.4 10*3/uL (ref 1.4–7.0)
Neutrophils: 69 %
Platelets: 269 10*3/uL (ref 150–450)
RBC: 3.9 x10E6/uL (ref 3.77–5.28)
RDW: 14.8 % (ref 11.7–15.4)
WBC: 6.4 10*3/uL (ref 3.4–10.8)

## 2024-01-06 LAB — URINE MICROSCOPIC (REFLEX ONLY)
Bacteria Ur: NONE SEEN
Casts Ur: NONE SEEN /LPF
RBC Ur: >30 /HPF — AB (ref 0–2)
WBC Ur: NONE SEEN /HPF (ref 0–5)

## 2024-01-06 LAB — BASIC METABOLIC PANEL
Anion Gap: 14 mmol/L (ref 10.0–18.0)
BUN/Creat Ratio: 36 — ABNORMAL HIGH (ref 12–28)
BUN: 20 mg/dL (ref 8–27)
Calcium: 8.3 mg/dL — ABNORMAL LOW (ref 8.7–10.3)
Carbon Dioxide: 21 mmol/L (ref 20–29)
Chloride: 103 mmol/L (ref 96–106)
Creat: 0.56 mg/dL — ABNORMAL LOW (ref 0.57–1.00)
Glucose: 99 mg/dL (ref 70–99)
Potassium: 4.4 mmol/L (ref 3.5–5.2)
Sodium: 138 mmol/L (ref 134–144)
eGFR: 88 mL/min/{1.73_m2} (ref >59)

## 2024-01-06 LAB — URINALYSIS
Bilirubin Ur: NEGATIVE
Glucose Ur: NEGATIVE
Ketones Ur: NEGATIVE
Nitrite Ur: NEGATIVE
Specific Gravity Ur: 1.021 (ref 1.005–1.030)
Urobilinogen,Semi-Qn Ur: 1 mg/dL (ref 0.2–1.0)
pH Ur: 6 (ref 5.0–7.5)

## 2024-01-19 ENCOUNTER — Encounter: Payer: MEDICARE | Attending: Family Medicine | Primary: Family Medicine

## 2024-02-02 ENCOUNTER — Ambulatory Visit: Admit: 2024-02-02 | Discharge: 2024-02-02 | Payer: MEDICARE | Attending: Family Medicine | Primary: Family Medicine

## 2024-02-02 ENCOUNTER — Encounter: Payer: MEDICARE | Attending: Family Medicine | Primary: Family Medicine

## 2024-02-02 DIAGNOSIS — K56609 Unspecified intestinal obstruction, unspecified as to partial versus complete obstruction: Principal | ICD-10-CM

## 2024-02-02 NOTE — Progress Notes (Signed)
 [facility]  [address]  [address]  [place] [address]  Dept: [phone]  Dept Fax: [phone]     Patient ID: [name] is a 89 y.o. female who presents for follow-up (Feeling much better this week--  stomach issue are better).    Subjective  HPI  Here in follow up . Feeling better this month.  She   Saw GI and started xifaxamin,   feels better.      No nausea.  No heartburn.   No diarrhea . One BM per day.   Had SBO last month and lots of gastric surgery in the past.  Has been treated for dysmotility with motegrity     Current Outpatient Medications   Medication Instructions    levothyroxine  (SYNTHROID , LEVOXYL ) 112 mcg, oral, Daily    mirtazapine  (Remeron ) 15 mg tablet TAKE 1 TABLET BY MOUTH EVERYDAY AT BEDTIME    Motegrity  1 mg, oral, Daily    ondansetron  (ZOFRAN ) 4 mg, oral, Every 8 hours PRN    pantoprazole  (PROTONIX ) 40 mg, oral, 2 times daily, Do not crush, chew, or split.    pregabalin  (LYRICA ) 50 mg, oral, Daily   All: none    soc:      appetite improved.    Working at [employer].      Not sleeping well.  Review of Systems   HENT: Negative.     Eyes: Negative.    Respiratory: Negative.     Cardiovascular: Negative.    Gastrointestinal: Negative.    Genitourinary: Negative.    Musculoskeletal: Negative.    Skin: Negative.    Neurological: Negative.    Psychiatric/Behavioral: Negative.        Objective  Visit Vitals  BP 120/80   Temp 36.9 C (98.5 F)   Wt 40.4 kg   BMI 15.77 kg/m   OB Status Postmenopausal   BSA 1.34 m     Physical Exam  BP 120/80   Temp 36.9 C (98.5 F)   Wt 40.4 kg   BMI 15.77 kg/m     General Appearance:    Alert, cooperative, no distress,  looks younger than age    Head:    Normocephalic, without obvious abnormality, atraumatic   Eyes:    PERRL, conjunctiva/corneas clear, EOM's intact, fundi     benign, both eyes   Ears:    Normal TM's and external ear canals, both ears   Nose:   Nares normal, septum midline, mucosa normal, no drainage     or sinus tenderness    Throat:   Lips, mucosa, and tongue normal; teeth and gums normal   Neck:   Supple, symmetrical, trachea midline, no adenopathy;     thyroid :  no enlargement/tenderness/nodules; no carotid    bruit or JVD   Back:     Symmetric, no curvature, ROM normal, no CVA tenderness   Lungs:     Clear to auscultation bilaterally, respirations unlabored   Chest Wall:        Heart:    Regular rate and rhythm, S1 and S2 normal, no murmur, rub    or gallop   Breast Exam:       Abdomen:     Soft, non-tender, bowel sounds active all four quadrants,     no masses, no organomegaly   Genitalia:     Rectal:     Extremities:   Extremities normal, atraumatic, no cyanosis or edema   Pulses:   2+ and symmetric all extremities  Skin:   Skin color, texture, turgor normal, no rashes or lesions   Lymph nodes:   Cervical, supraclavicular, and axillary nodes normal   Neurologic:   CNII-XII intact, normal strength, sensation and reflexes     throughout      Assessment / Plan  Gayla was seen today for follow-up.  Small bowel obstruction (Multi-HCC)  Comments:  better this month.    saw GI and now on rifaxamin  Dysmotility of stomach  Comments:  long hx of this.  continues on motegrity  for now  Iron  deficiency anemia, unspecified iron  deficiency anemia type  Comments:  no longer anemic  Renal stone  Comments:  no symptoms.  check  BMP, UA , and renal US .  refer to urology if obstruction or symptoms  Orders:  -     Basic metabolic panel; Future  -     Urinalysis; Future  -     CBC and differential - WAM and non-WAM; Future  -     US  KIDNEY/RENAL AND BLADDER; Future  Acquired hypothyroidism   Comments:  recheck TSH and adjust  Orders:  -     TSH; Future  Psychophysiological insomnia  Comments:  remeron  helps.  off amitiptylline  Restless leg syndrome  Comments:  lyrica  helps

## 2024-02-05 LAB — BASIC METABOLIC PANEL
Anion Gap: 9 mmol/L — ABNORMAL LOW (ref 10.0–18.0)
BUN/Creat Ratio: 39 — ABNORMAL HIGH (ref 12–28)
BUN: 18 mg/dL (ref 8–27)
Calcium: 8.6 mg/dL — ABNORMAL LOW (ref 8.7–10.3)
Carbon Dioxide: 25 mmol/L (ref 20–29)
Chloride: 104 mmol/L (ref 96–106)
Creat: 0.46 mg/dL — ABNORMAL LOW (ref 0.57–1.00)
Glucose: 82 mg/dL (ref 70–99)
Potassium: 4.8 mmol/L (ref 3.5–5.2)
Sodium: 138 mmol/L (ref 134–144)
eGFR: 93 mL/min/1.73 (ref >59)

## 2024-02-05 LAB — CBC W/DIFF
Baso Abs: 0 x10E3/uL (ref 0.0–0.2)
Basos: 0 %
Eos Abs: 0.1 x10E3/uL (ref 0.0–0.4)
Eos: 2 %
Hct: 37.6 % (ref 34.0–46.6)
Hgb: 11.5 g/dL (ref 11.1–15.9)
Immature Grans Abs: 0 x10E3/uL (ref 0.0–0.1)
Immature Granulocytes: 1 %
Lymphs Abs: 1.4 x10E3/uL (ref 0.7–3.1)
Lymphs: 25 %
MCH: 28.3 pg (ref 26.6–33.0)
MCHC: 30.6 g/dL — ABNORMAL LOW (ref 31.5–35.7)
MCV: 93 fL (ref 79–97)
Monocytes: 11 %
MonocytesAbs: 0.6 x10E3/uL (ref 0.1–0.9)
Neutrophils Abs: 3.4 x10E3/uL (ref 1.4–7.0)
Neutrophils: 61 %
Platelets: 252 x10E3/uL (ref 150–450)
RBC: 4.06 x10E6/uL (ref 3.77–5.28)
RDW: 14.7 % (ref 11.7–15.4)
WBC: 5.5 x10E3/uL (ref 3.4–10.8)

## 2024-02-05 LAB — TSH: TSH: 1.04 u[IU]/mL (ref 0.450–4.500)

## 2024-02-09 ENCOUNTER — Ambulatory Visit: Payer: MEDICARE | Primary: Family Medicine

## 2024-03-06 NOTE — Telephone Encounter (Signed)
 Pt called and left a message with the answering service to schedule an appointment. Can we keep a lookout for an appointment.

## 2024-03-08 NOTE — Telephone Encounter (Signed)
 noted

## 2024-03-08 NOTE — Telephone Encounter (Signed)
 Fax info attention [facility].

## 2024-03-08 NOTE — Telephone Encounter (Signed)
 She needs to see urology  fo r these and she states she called [facility] but hasnt heard back  she called and place in [place] (she will be getting back to me with name a name and address  they will be wanting ov notes and labs

## 2024-03-17 NOTE — Patient Instructions (Unsigned)
 Kathey from [facility] called and asked us  to fax over OV ad Labs. Asked if we can write ATTN KATHY in big letters. Fax#[phone]

## 2024-03-17 NOTE — Telephone Encounter (Signed)
 faxed

## 2024-03-23 MED ORDER — oxyCODONE-acetaminophen (Percocet) 5-325 mg tablet
5-325 | ORAL_TABLET | Freq: Four times a day (QID) | ORAL | 0 refills | 28.00000 days | Status: DC | PRN
Start: 2024-03-23 — End: 2024-03-29

## 2024-03-23 NOTE — Telephone Encounter (Signed)
 Noted TY

## 2024-03-23 NOTE — Telephone Encounter (Signed)
 Couldnt get an appt until next week and is in a lot of pain. Tylenol  is not helping. Asking if you can send her in something.

## 2024-03-29 MED ORDER — ondansetron (Zofran) 4 mg tablet
4 | ORAL_TABLET | Freq: Three times a day (TID) | ORAL | 1 refills | 7.00000 days | Status: DC | PRN
Start: 2024-03-29 — End: 2024-04-24

## 2024-03-29 MED ORDER — oxyCODONE-acetaminophen (Percocet) 5-325 mg tablet
5-325 | ORAL_TABLET | Freq: Four times a day (QID) | ORAL | 0 refills | 28.00000 days | Status: DC | PRN
Start: 2024-03-29 — End: 2024-04-16

## 2024-04-05 ENCOUNTER — Inpatient Hospital Stay: Admit: 2024-04-05 | Payer: MEDICARE | Primary: Family Medicine

## 2024-04-05 DIAGNOSIS — N200 Calculus of kidney: Secondary | ICD-10-CM

## 2024-04-11 NOTE — Telephone Encounter (Signed)
 Feels it is time to schedule the procedure

## 2024-04-11 NOTE — Telephone Encounter (Signed)
 Booked

## 2024-04-11 NOTE — Telephone Encounter (Signed)
 Called.   Lets have her in next week for evaluation.   Urologist did not think her pain was from the stone

## 2024-04-17 MED ORDER — oxyCODONE-acetaminophen (Percocet) 5-325 mg tablet
5-325 | ORAL_TABLET | Freq: Four times a day (QID) | ORAL | 0 refills | 28.00000 days | Status: DC | PRN
Start: 2024-04-17 — End: 2024-04-24

## 2024-04-18 ENCOUNTER — Encounter: Payer: MEDICARE | Attending: Family Medicine | Primary: Family Medicine

## 2024-04-19 ENCOUNTER — Ambulatory Visit: Admit: 2024-04-19 | Discharge: 2024-04-19 | Payer: MEDICARE | Attending: Family Medicine | Primary: Family Medicine

## 2024-04-19 DIAGNOSIS — R10A2 Flank pain, left side: Principal | ICD-10-CM

## 2024-04-19 NOTE — Progress Notes (Signed)
 [facility]  [address]  [address]  [place] [address]  Dept: [phone]  Dept Fax: [phone]     Patient ID: [name] is a 89 y.o. female who presents for Follow-up (Pain; consistent pain to left abdomen, side and back; feels swelling to left side of abdomen).    Subjective  HPI  Here with left flank pain for three  months.   Left flank radiates to LLQ and back.  Some distention of the area.    Had CT over the summer that showed large stone and hydronephrosis.       No fever.   No constipation.    Urine is clear currently.    She saw urology who felt her pain was not related to stone.    No rash.   Had blood in urine in August.   No nausea.  Vomited once.      Long hx of gastric outlet obstruction and dysmotility.  Bowels improved and has been of motegrity    Current Outpatient Medications   Medication Instructions    levothyroxine  (SYNTHROID , LEVOXYL ) 112 mcg, oral, Daily    mirtazapine  (Remeron ) 15 mg tablet TAKE 1 TABLET BY MOUTH EVERYDAY AT BEDTIME    ondansetron  (ZOFRAN ) 4 mg, oral, Every 8 hours PRN    oxyCODONE -acetaminophen  (Percocet) 5-325 mg tablet 1 tablet, oral, Every 6 hours PRN    pantoprazole  (PROTONIX ) 40 mg, oral, 2 times daily, Do not crush, chew, or split.    pregabalin  (LYRICA ) 50 mg, oral, Daily   All:  none   soc:  no smoking.  No alcohol.  Still works at [employer]  Review of Systems   Constitutional: Negative.    HENT: Negative.     Eyes: Negative.    Respiratory: Negative.     Cardiovascular: Negative.    Gastrointestinal:  Positive for abdominal pain and nausea.   Genitourinary:  Positive for flank pain.   Musculoskeletal:  Positive for back pain.   Skin: Negative.    Neurological: Negative.    Psychiatric/Behavioral: Negative.          Objective  Visit Vitals  BP 120/62   Pulse 84   Ht 1.6 m Comment: 63in   Wt 41.2 kg Comment: 90.8lbs   SpO2 98%   BMI 16.08 kg/m   OB Status Postmenopausal   BSA 1.35 m     Physical Exam  BP 120/62   Pulse 84   Ht 1.6 m  Comment: 63in  Wt 41.2 kg Comment: 90.8lbs  SpO2 98%   BMI 16.08 kg/m     General Appearance:    Alert, cooperative, no distress, appears stated age   Head:    Normocephalic, without obvious abnormality, atraumatic   Eyes:    PERRL, conjunctiva/corneas clear, EOM's intact, fundi     benign, both eyes        Ears:    Normal TM's and external ear canals, both ears   Nose:   Nares normal, septum midline, mucosa normal, no drainage    or sinus tenderness   Throat:   Lips, mucosa, and tongue normal; teeth and gums normal   Neck:   Supple, symmetrical, trachea midline, no adenopathy;        thyroid :  No enlargement/tenderness/nodules; no carotid    bruit or JVD   Back:        Lungs:     Clear to auscultation bilaterally, respirations unlabored   Chest wall:    No tenderness  or deformity   Heart:    Regular rate and rhythm, S1 and S2 normal, no murmur, rub    or gallop   Abdomen:     Soft, non-tender, bowel sounds active all four quadrants,     no masses, no organomegaly    Left flank tender .  Distention  of LUQ   Old scar in this area    Genitalia:     Rectal:     Extremities:   Extremities normal, atraumatic, no cyanosis or edema   Pulses:   2+ and symmetric all extremities   Skin:   Skin color, texture, turgor normal, no rashes or lesions   Lymph nodes:   Cervical, supraclavicular, and axillary nodes normal   Neurologic:   CNII-XII intact. Normal strength, sensation and reflexes       throughout         Assessment / Plan  [name] was seen today for follow-up.  Left flank pain  -     CBC and differential - WAM and non-WAM; Future  -     Basic metabolic panel; Future  -     Urinalysis reflex to culture; Future  -     CT ABDOMEN PELVIS W CONTRAST; Future  Renal stone  -     Urinalysis reflex to culture; Future  -     CT ABDOMEN PELVIS W CONTRAST; Future  Renal mass  -     CT ABDOMEN PELVIS W CONTRAST; Future  Dysmotility of stomach           Known left renal stone and had hydronephrosis in June.   Has LUQ fullness and  tenderness.   US  showed renal mass.  Check CT, labs,  UA.    Heat, stretching, pain meds

## 2024-04-22 LAB — CBC W/DIFF
Baso Abs: 0 x10E3/uL (ref 0.0–0.2)
Basos: 0 %
Eos Abs: 0.1 x10E3/uL (ref 0.0–0.4)
Eos: 1 %
Hct: 38.6 % (ref 34.0–46.6)
Hgb: 12.1 g/dL (ref 11.1–15.9)
Immature Grans Abs: 0 x10E3/uL (ref 0.0–0.1)
Immature Granulocytes: 0 %
Lymphs Abs: 1.2 x10E3/uL (ref 0.7–3.1)
Lymphs: 23 %
MCH: 28.6 pg (ref 26.6–33.0)
MCHC: 31.3 g/dL — ABNORMAL LOW (ref 31.5–35.7)
MCV: 91 fL (ref 79–97)
Monocytes: 11 %
MonocytesAbs: 0.6 x10E3/uL (ref 0.1–0.9)
Neutrophils Abs: 3.3 x10E3/uL (ref 1.4–7.0)
Neutrophils: 65 %
Platelets: 237 x10E3/uL (ref 150–450)
RBC: 4.23 x10E6/uL (ref 3.77–5.28)
RDW: 14.8 % (ref 11.7–15.4)
WBC: 5.1 x10E3/uL (ref 3.4–10.8)

## 2024-04-22 LAB — BASIC METABOLIC PANEL
Anion Gap: 9 mmol/L — ABNORMAL LOW (ref 10.0–18.0)
BUN/Creat Ratio: 38 — ABNORMAL HIGH (ref 12–28)
BUN: 20 mg/dL (ref 8–27)
Calcium: 8.5 mg/dL — ABNORMAL LOW (ref 8.7–10.3)
Carbon Dioxide: 24 mmol/L (ref 20–29)
Chloride: 103 mmol/L (ref 96–106)
Creat: 0.52 mg/dL — ABNORMAL LOW (ref 0.57–1.00)
Glucose: 122 mg/dL — ABNORMAL HIGH (ref 70–99)
Potassium: 4.3 mmol/L (ref 3.5–5.2)
Sodium: 136 mmol/L (ref 134–144)
eGFR: 90 mL/min/1.73 (ref >59)

## 2024-04-22 LAB — URINALYSIS REFLEX TO CULTURE
Bilirubin Ur: NEGATIVE
Glucose Ur: NEGATIVE
Ketones Ur: NEGATIVE
Nitrite Ur: NEGATIVE
Occult Blood Ur: NEGATIVE
Protein Ur: NEGATIVE
Specific Gravity Ur: 1.015 (ref 1.005–1.030)
Urobilinogen,Semi-Qn Ur: 0.2 mg/dL (ref 0.2–1.0)
WBC Esterase Ur: NEGATIVE
pH Ur: 6 (ref 5.0–7.5)

## 2024-04-22 LAB — URINE MICROSCOPIC (REFLEX ONLY)
Bacteria Ur: NONE SEEN
Casts Ur: NONE SEEN /LPF
RBC Ur: NONE SEEN /HPF (ref 0–2)
WBC Ur: NONE SEEN /HPF (ref 0–5)

## 2024-04-24 MED ORDER — ondansetron (Zofran) 4 mg tablet
4 | ORAL_TABLET | Freq: Three times a day (TID) | ORAL | 1 refills | 7.00000 days | Status: AC | PRN
Start: 2024-04-24 — End: ?

## 2024-04-24 MED ORDER — oxyCODONE-acetaminophen (Percocet) 5-325 mg tablet
5-325 | ORAL_TABLET | Freq: Four times a day (QID) | ORAL | 0 refills | 28.00000 days | Status: DC | PRN
Start: 2024-04-24 — End: 2024-05-03

## 2024-04-24 NOTE — Telephone Encounter (Signed)
 Please advise update when you can, TY

## 2024-05-04 MED ORDER — oxyCODONE-acetaminophen (Percocet) 5-325 mg tablet
5-325 | ORAL_TABLET | Freq: Four times a day (QID) | ORAL | 0 refills | Status: AC | PRN
Start: 2024-05-04 — End: 2024-05-12

## 2024-05-04 MED ORDER — mirtazapine (Remeron) 15 mg tablet
15 | ORAL_TABLET | ORAL | 1 refills | 28.00000 days | Status: AC
Start: 2024-05-04 — End: ?

## 2024-05-12 NOTE — Telephone Encounter (Signed)
 Pmp 05/04/24   Qty 20, 5 days

## 2024-05-13 MED ORDER — ONDANSETRON HCL 4 MG TABLET
4 | ORAL_TABLET | Freq: Three times a day (TID) | ORAL | 1 refills | 7.00000 days | Status: DC | PRN
Start: 2024-05-13 — End: 2024-08-01

## 2024-05-13 MED ORDER — OXYCODONE-ACETAMINOPHEN 5 MG-325 MG TABLET
5-325 | ORAL_TABLET | Freq: Four times a day (QID) | ORAL | 0 refills | 28.00000 days | Status: DC | PRN
Start: 2024-05-13 — End: 2024-05-18

## 2024-05-17 ENCOUNTER — Inpatient Hospital Stay: Admit: 2024-05-17 | Payer: MEDICARE | Primary: Family Medicine

## 2024-05-17 DIAGNOSIS — R10A2 Flank pain, left side: Principal | ICD-10-CM

## 2024-05-17 MED ORDER — IOHEXOL 350 MG IODINE/ML INTRAVENOUS SOLUTION
350 | Freq: Once | INTRAVENOUS | Status: AC
Start: 2024-05-17 — End: 2024-05-17
  Administered 2024-05-17: 21:00:00 100 mL via INTRAVENOUS

## 2024-05-18 MED ORDER — OXYCODONE-ACETAMINOPHEN 5 MG-325 MG TABLET
5-325 | ORAL_TABLET | Freq: Four times a day (QID) | ORAL | 0 refills | 25.00000 days | Status: DC | PRN
Start: 2024-05-18 — End: 2024-05-23

## 2024-05-18 NOTE — Telephone Encounter (Signed)
 IMPRESSION:     1.6 cm stone in the medial left renal collecting system with mild to moderate fullness of the left renal collecting system with increasing wall thickening of the renal collecting system. This may be post inflammatory as opposed to tumor. No evidence of a discrete renal mass.. No right-sided nephrolithiasis.     Redemonstration of distention of the gallbladder with new mild intrahepatic biliary ductal dilatation. No definite obstructing common bile duct stone or mass. Clinical correlation is recommended.     Stable chronic and postsurgical changes as above.

## 2024-05-21 NOTE — Telephone Encounter (Signed)
 Called and discussed.  Lets refer to urology for left flank pain with these findings

## 2024-05-23 MED ORDER — OXYCODONE-ACETAMINOPHEN 5 MG-325 MG TABLET
5-325 | ORAL_TABLET | Freq: Four times a day (QID) | ORAL | 0 refills | 28.00000 days | Status: DC | PRN
Start: 2024-05-23 — End: 2024-05-28

## 2024-05-23 NOTE — Telephone Encounter (Signed)
 5 day supply 05/18/24

## 2024-05-23 NOTE — Telephone Encounter (Signed)
 Pt was seen by her PCP for a kidney stone. She had a CT scan. She was seen at [facility] and wants to see one of our urologists. Please advise how soon she shoud be booked?

## 2024-05-25 NOTE — Telephone Encounter (Signed)
 Pt is allset.

## 2024-05-26 ENCOUNTER — Ambulatory Visit: Admit: 2024-05-26 | Discharge: 2024-05-26 | Payer: MEDICARE | Attending: Urology | Primary: Family Medicine

## 2024-05-26 LAB — POCT URINALYSIS DIPSTICK
POCT Bilirubin, UA: NEGATIVE
POCT Blood, UA: NEGATIVE
POCT Glucose, UA: NEGATIVE
POCT Leukocyte Esterase, UA: NEGATIVE
POCT Nitrites, UA: NEGATIVE
POCT Protein, UA: NEGATIVE
POCT Specific Gravity, UA: 1.015 (ref 1.003–1.030)
POCT Urobilinogen, UA: NEGATIVE
POCT pH, UA: 6

## 2024-05-26 NOTE — Telephone Encounter (Signed)
 Spoke with pt - aware of surgical date 06/28/24 at [facility].

## 2024-05-26 NOTE — Progress Notes (Signed)
 Elaine Rives, MD       Office follow up          PATIENT NAME: [name]   DATE OF BIRTH: Oct 17, 1934         CHIEF COMPLAINT:No chief complaint on file.      Subjective:       [name] is a 89 y.o. female who presents to the office for follow up of Kidney stone.   She is concerned with left flank pain that occurs daily. The pain is in the left flank.  She has been taking oxycodone  and has had gross hematuria.  She was evaluated with Dr. Victory who recommended ureteroscopy laser lithotripsy.                  Medications  Prior to Admission medications   Medication Sig Start Date End Date Taking? Authorizing Provider   levothyroxine  (Synthroid , Levoxyl ) 112 mcg tablet TAKE 1 TABLET BY MOUTH ONCE DAILY. 03/20/24   Elaine KANDICE Cotta, MD   mirtazapine  (Remeron ) 15 mg tablet TAKE 1 TABLET BY MOUTH EVERYDAY AT BEDTIME 05/04/24   Elaine KANDICE Cotta, MD   ondansetron  (Zofran ) 4 mg tablet Take 1 tablet (4 mg) by mouth every 8 (eight) hours if needed for nausea or vomiting. 05/13/24   Elaine KANDICE Cotta, MD   oxyCODONE -acetaminophen  (Percocet) 5-325 mg tablet Take 1 tablet by mouth every 6 (six) hours if needed for pain score 7-10 (severe). 05/23/24   Elaine KANDICE Cotta, MD   pantoprazole  (ProtoNix ) 40 mg EC tablet TAKE 1 TABLET (40 MG) BY MOUTH TWICE DAILY. DO NOT CRUSH, CHEW, OR SPLIT. 03/09/24   Elaine KANDICE Cotta, MD   pregabalin  (Lyrica ) 50 mg capsule TAKE 1 CAPSULE BY MOUTH EVERY DAY 03/20/24   Elaine KANDICE Cotta, MD   oxyCODONE -acetaminophen  (Percocet) 5-325 mg tablet Take 1 tablet by mouth every 6 (six) hours if needed for pain score 7-10 (severe). 05/18/24 05/23/24  Elaine KANDICE Cotta, MD        Vitals:  There were no vitals taken for this visit.    Physical Exam:  General: Alert, in no acute distress  Head: Normocephalic, atraumatic  Neck: supple, trachea is midline, no obvious masses  Respiratory: normal effort, no audible wheezes  Cardiovascular: regular pulse and no cyanosis  Musculoskeletal: moving all extremities, normal  tone  Skin: warm and dry  Psych: normal mood and affect, oriented  Abdomen: soft, non distended, non tender  GU: no CVA tenderness      LABS:  No results found for: PSAFREE, PSAFREEPCT  No results for input(s): PSAFREE, PSAFREEPCT in the last 72 hours.  No results found for: TESTOSTERONE  Lab Results   Component Value Date    WBC 5.1 04/21/2024    HGB 12.1 04/21/2024    HCT 38.6 04/21/2024    MCV 91 04/21/2024    PLT 237 04/21/2024     Lab Results   Component Value Date    GLUCOSE 122 (H) 04/21/2024    CALCIUM 8.5 (L) 04/21/2024    NA 136 04/21/2024    K 4.3 04/21/2024    CO2 24 04/21/2024    CL 103 04/21/2024    BUN 20 04/21/2024    CREATININE 0.52 (L) 04/21/2024     No components found for: [facility]        Radiology Review:   Narrative & Impression   Procedure: CT ABDOMEN PELVIS W CONTRAST  05/17/2024 4:31 PM     Indications: 89 years old  Female patient with left flank pain . History of nephrolithiasis     Comparison: CT scans from 07/02/2020, 12/24/2023 and 04/13/2020 and ultrasound from 04/05/2024     TECHNIQUE:   CT of the abdomen and pelvis with intravenous and with oral contrast.  Dose reduction techniques including automated exposure control and adjustment of the [place] and/or KV according to the patient size were utilized.     FINDINGS:  LOWER THORAX:  Mild atelectasis or scarring in both lung bases.     HEPATOBILIARY:  No focal hepatic lesions. Mild intrahepatic biliary ductal dilatation. The common bile duct looks normal in caliber. No shortening mass or stone. There is a stable small cluster of vessels in the left lobe of the liver, which is nonspecific but could represent a small intrahepatic shunt.. A few subcentimeter hypodensities in the right lobe of the liver which are too small to characterize but probably represent cysts.     GALLBLADDER:  There are distended, which appears not significantly changed in comparison to the prior examination. No abnormal wall thickening.     SPLEEN:  The spleen is  not enlarged.     PANCREAS:  There is no pancreatic ductal dilatation or peripancreatic inflammation.     ADRENALS:  There is no adrenal nodule.     KIDNEYS/URETERS:  There is a large, 1.6 cm stone in the left renal collecting system. There is associated moderate fullness of the renal pelvis with associated circumferential wall thickening with mild stranding. This has progressed in comparison to the prior examination. The kidneys symmetrically enhance. There is no discrete left renal mass. There is mild left renal cortical thinning. There is no ureteral calculus.     The right kidney homogeneously enhances without evidence of a mass. There is mild renal cortical thinning. There is no nephrolithiasis or hydronephrosis.     PELVIC ORGANS/BLADDER:  Several calcified uterine fibroids. Stable small benign-appearing calcifications in the left ovary. No bladder calculi. The urinary bladder contour is smooth. There are few phleboliths in the pelvis.     PERITONEUM / RETROPERITONEUM:  There is no free intraperitoneal gas or fluid.     LYMPH NODES: There are no enlarged lymph nodes in the abdomen or pelvis.     VESSELS:  Extensive aortic and iliac artery atheromatous calcifications without aneurysmal dilatation. At least moderate narrowing of the common iliac arteries.SABRA     GI TRACT: There are postsurgical changes status post a distal gastrectomy and Billroth procedure. Surgical chain sutures are also noted at the proximal transverse colon. There is a moderate amount of stool in the transverse colon. There is no obstruction. Oral contrast has extended through to the proximal ascending colon. There is no bowel obstruction or inflammatory change.     BONES AND SOFT TISSUES:  Diffuse bony demineralization. Moderate multilevel degenerative changes in the spine. Mild chronic endplate scalloping at L3, T12 and T11     IMPRESSION:     1.6 cm stone in the medial left renal collecting system with mild to moderate fullness of the left  renal collecting system with increasing wall thickening of the renal collecting system. This may be post inflammatory as opposed to tumor. No evidence of a discrete renal mass.. No right-sided nephrolithiasis.     Redemonstration of distention of the gallbladder with new mild intrahepatic biliary ductal dilatation. No definite obstructing common bile duct stone or mass. Clinical correlation is recommended.     Stable chronic and postsurgical changes as above.  Glendia Mower, MD 05/18/2024 7:58 AM       Impression/Plan    89 y.o. female with history of Left UPJ obstruction, Left Renal Stone 1.6 cm,     -discussed observation vs intervention    -recommend cysto urs laser litho stent placement discussed risks of staged procedure and stent    -counseled on risks of bleeding, infection, damage to surrounding organs, expected recovery, ureteral stent placement, possibility of additional procedures     Elaine Rives MD

## 2024-05-29 MED ORDER — OXYCODONE-ACETAMINOPHEN 5 MG-325 MG TABLET
5-325 | ORAL_TABLET | Freq: Four times a day (QID) | ORAL | 0 refills | 28.00000 days | Status: DC | PRN
Start: 2024-05-29 — End: 2024-06-04

## 2024-05-30 NOTE — Telephone Encounter (Signed)
 Pt aware of surgery on 06/28/24 at [facility]. Aware to be NPO after midnight, ride home, labs/ekg to be done prior. Order/letter scanned in media and mailed to pt.    Prior auth - not required  Referral - not required  Medical clearance - not required    Pre-testing ordered: CBC, BMP, EKG

## 2024-06-02 ENCOUNTER — Ambulatory Visit: Payer: MEDICARE | Primary: Family Medicine

## 2024-06-04 MED ORDER — OXYCODONE-ACETAMINOPHEN 5 MG-325 MG TABLET
5-325 | ORAL_TABLET | Freq: Four times a day (QID) | ORAL | 0 refills | 28.00000 days | Status: DC | PRN
Start: 2024-06-04 — End: 2024-06-08

## 2024-06-08 MED ORDER — OXYCODONE-ACETAMINOPHEN 5 MG-325 MG TABLET
5-325 | ORAL_TABLET | Freq: Four times a day (QID) | ORAL | 0 refills | 28.00000 days | Status: DC | PRN
Start: 2024-06-08 — End: 2024-06-12

## 2024-06-08 NOTE — Telephone Encounter (Signed)
 Pmp 06/04/24 5 day

## 2024-06-13 MED ORDER — OXYCODONE-ACETAMINOPHEN 5 MG-325 MG TABLET
5-325 | ORAL_TABLET | Freq: Four times a day (QID) | ORAL | 0 refills | Status: DC | PRN
Start: 2024-06-13 — End: 2024-06-18

## 2024-06-13 NOTE — Telephone Encounter (Signed)
 Pmp 06/08/24  Qty 20, 5 days

## 2024-06-14 ENCOUNTER — Inpatient Hospital Stay: Admit: 2024-06-14 | Payer: MEDICARE | Primary: Family Medicine

## 2024-06-14 DIAGNOSIS — N200 Calculus of kidney: Secondary | ICD-10-CM

## 2024-06-15 LAB — BASIC METABOLIC PANEL
Anion Gap: 7 mmol/L — ABNORMAL LOW (ref 10.0–18.0)
BUN/Creat Ratio: 27 (ref 12–28)
BUN: 17 mg/dL (ref 8–27)
Calcium: 8.1 mg/dL — ABNORMAL LOW (ref 8.7–10.3)
Carbon Dioxide: 28 mmol/L (ref 20–29)
Chloride: 104 mmol/L (ref 96–106)
Creat: 0.63 mg/dL (ref 0.57–1.00)
Glucose: 92 mg/dL (ref 70–99)
Potassium: 4.9 mmol/L (ref 3.5–5.2)
Sodium: 139 mmol/L (ref 134–144)
eGFR: 86 mL/min/1.73 (ref >59)

## 2024-06-15 LAB — CBC W/DIFF
Baso Abs: 0 x10E3/uL (ref 0.0–0.2)
Basos: 0 %
Eos Abs: 0.1 x10E3/uL (ref 0.0–0.4)
Eos: 2 %
Hct: 36.1 % (ref 34.0–46.6)
Hgb: 10.8 g/dL — ABNORMAL LOW (ref 11.1–15.9)
Immature Grans Abs: 0 x10E3/uL (ref 0.0–0.1)
Immature Granulocytes: 0 %
Lymphs Abs: 1.1 x10E3/uL (ref 0.7–3.1)
Lymphs: 24 %
MCH: 27.6 pg (ref 26.6–33.0)
MCHC: 29.9 g/dL — ABNORMAL LOW (ref 31.5–35.7)
MCV: 92 fL (ref 79–97)
Monocytes: 11 %
MonocytesAbs: 0.5 x10E3/uL (ref 0.1–0.9)
Neutrophils Abs: 2.9 x10E3/uL (ref 1.4–7.0)
Neutrophils: 63 %
Platelets: 248 x10E3/uL (ref 150–450)
RBC: 3.91 x10E6/uL (ref 3.77–5.28)
RDW: 13.2 % (ref 11.7–15.4)
WBC: 4.7 x10E3/uL (ref 3.4–10.8)

## 2024-06-18 MED ORDER — OXYCODONE-ACETAMINOPHEN 5 MG-325 MG TABLET
5-325 | ORAL_TABLET | Freq: Four times a day (QID) | ORAL | 0 refills | 30.00000 days | Status: DC | PRN
Start: 2024-06-18 — End: 2024-06-25

## 2024-06-18 MED ORDER — PANTOPRAZOLE 40 MG TABLET,DELAYED RELEASE
40 | ORAL_TABLET | Freq: Two times a day (BID) | ORAL | 1 refills | 30.00000 days | Status: AC
Start: 2024-06-18 — End: ?

## 2024-06-21 NOTE — Telephone Encounter (Signed)
 Surgery - 06/28/24    Spoke with pt - reminded to be NPO after midnight, ride home.

## 2024-06-23 NOTE — Preprocedure Instructions (Signed)
 Current Medications   Medication Sig Instructions    levothyroxine  (Synthroid , Levoxyl ) 112 mcg tablet TAKE 1 TABLET BY MOUTH ONCE DAILY. Take morning of surgery with sip of water , no other fluids    mirtazapine  (Remeron ) 15 mg tablet TAKE 1 TABLET BY MOUTH EVERYDAY AT BEDTIME Continue until night before surgery    ondansetron  (Zofran ) 4 mg tablet Take 1 tablet (4 mg) by mouth every 8 (eight) hours if needed for nausea or vomiting. PRN take as needed    oxyCODONE -acetaminophen  (Percocet ) 5-325 mg tablet Take 1 tablet by mouth every 6 (six) hours if needed for pain score 7-10 (severe). PRN take as needed    pantoprazole  (ProtoNix ) 40 mg EC tablet Take 1 tablet (40 mg) by mouth twice daily. Do not crush, chew, or split. Take morning of surgery with sip of water , no other fluids       Advised patient:  arrive 0820 for 1020 surgery 06/28/24, health assessment complete     - OR time may change and if it does, they will receive a phone call after 3 pm the night before surgery  - One visitor is allowed at this time and they must be over the age of 58 years  - NPO (nothing by mouth) instructions given, no food may be eaten after midnight the night before surgery, but the patient may have clear liquids up to 3 hours prior to surgery (water ,     apple juice, gatorade, ginger ale), unless you were instructed otherwise by your surgeon or during your pre-screening appointment  - Pre-Surgical drinks (Ensure Clear) - USE ONLY IF INSTRUCTED TO DO SO  - If you use a CPAP machine at home, bring it with you on the day of surgery  - If you use a rescue inhaler (albuterol), bring it with you on the day of surgery  - Shower is okay on the morning of surgery- If given Hibiclens (special soap), use as instructed  - No shaving at or near procedure site  - Arrange an escort home and family care overnight (should be someone over 18 yrs)  - Neuro/back surgery: bring MRI CD   - Bring list of medications if appropriate  - Complete Bowel prep  if appropriate  - Take medications as instructed during your preprocedure call/appointment  - Quit smoking to help prevent infections  - Don't wear any make up, jewelry, body piercings, or contact lenses   - DO NOT bring any cash or valuables with you   - Wear comfortable clothing: an outfit that is easy to take on and off, especially around your surgical site  - Please bring photo ID and insurance card    - Nurse Liaison in Surgical [facility] - [phone]

## 2024-06-25 MED ORDER — OXYCODONE-ACETAMINOPHEN 5 MG-325 MG TABLET
5-325 | ORAL_TABLET | Freq: Four times a day (QID) | ORAL | 0 refills | 2.00000 days | Status: DC | PRN
Start: 2024-06-25 — End: 2024-06-28

## 2024-06-26 NOTE — Telephone Encounter (Signed)
 Refuse, you sent this yesterday TY

## 2024-06-28 ENCOUNTER — Ambulatory Visit: Admit: 2024-06-28 | Payer: MEDICARE | Primary: Family Medicine

## 2024-06-28 MED ORDER — FENTANYL (PF) 50 MCG/ML INJECTION SOLUTION
50 | INTRAMUSCULAR | Status: AC
Start: 2024-06-28 — End: 2024-06-28

## 2024-06-28 MED ORDER — ONDANSETRON HCL (PF) 4 MG/2 ML INJECTION SOLUTION
4 | INTRAMUSCULAR | Status: DC | PRN
Start: 2024-06-28 — End: 2024-06-28
  Administered 2024-06-28: 16:00:00 4 via INTRAVENOUS

## 2024-06-28 MED ORDER — KETOROLAC 30 MG/ML (1 ML) INJECTION SOLUTION
30 | INTRAMUSCULAR | Status: AC
Start: 2024-06-28 — End: 2024-06-28

## 2024-06-28 MED ORDER — LACTATED RINGERS INTRAVENOUS SOLUTION
INTRAVENOUS | Status: DC | PRN
Start: 2024-06-28 — End: 2024-06-28
  Administered 2024-06-28: 15:00:00 via INTRAVENOUS

## 2024-06-28 MED ORDER — OXYCODONE-ACETAMINOPHEN 5 MG-325 MG TABLET
5-325 | ORAL_TABLET | Freq: Four times a day (QID) | ORAL | 0 refills | 2.00000 days | Status: AC | PRN
Start: 2024-06-28 — End: 2024-07-01

## 2024-06-28 MED ORDER — HYDROMORPHONE 0.5 MG/0.5 ML INJECTION WRAPPER
0.5 | INTRAMUSCULAR | Status: DC | PRN
Start: 2024-06-28 — End: 2024-06-28
  Administered 2024-06-28 (×2): 0.2 mg via INTRAVENOUS

## 2024-06-28 MED ORDER — CEFAZOLIN 1 GRAM SOLUTION FOR INJECTION
1 | INTRAMUSCULAR | Status: DC | PRN
Start: 2024-06-28 — End: 2024-06-28
  Administered 2024-06-28: 15:00:00 1 via INTRAVENOUS

## 2024-06-28 MED ORDER — HYDROMORPHONE 1 MG/ML INJECTION WRAPPER
1 | INTRAMUSCULAR | Status: AC
Start: 2024-06-28 — End: 2024-06-28

## 2024-06-28 MED ORDER — LIDOCAINE (PF) 20 MG/ML (2 %) INJECTION WRAPPER
20 | INTRAMUSCULAR | Status: DC | PRN
Start: 2024-06-28 — End: 2024-06-28
  Administered 2024-06-28: 15:00:00 40 via INTRAVENOUS

## 2024-06-28 MED ORDER — PROPOFOL 10 MG/ML INTRAVENOUS EMULSION
10 | INTRAVENOUS | Status: DC | PRN
Start: 2024-06-28 — End: 2024-06-28
  Administered 2024-06-28: 15:00:00 80 via INTRAVENOUS

## 2024-06-28 MED ORDER — CEFAZOLIN 1 GRAM SOLUTION FOR INJECTION
1 | INTRAMUSCULAR | Status: AC
Start: 2024-06-28 — End: 2024-06-28

## 2024-06-28 MED ORDER — KETOROLAC 30 MG/ML (1 ML) INJECTION SOLUTION
30 | Freq: Once | INTRAMUSCULAR | Status: DC
Start: 2024-06-28 — End: 2024-06-28

## 2024-06-28 MED ORDER — DOCUSATE SODIUM 100 MG CAPSULE
100 | ORAL_CAPSULE | Freq: Two times a day (BID) | ORAL | 0 refills | 30.00000 days | Status: AC | PRN
Start: 2024-06-28 — End: ?

## 2024-06-28 MED ORDER — LIDOCAINE (PF) 20 MG/ML (2 %) INJECTION WRAPPER
20 | INTRAMUSCULAR | Status: AC
Start: 2024-06-28 — End: 2024-06-28

## 2024-06-28 MED ORDER — DEXAMETHASONE SODIUM PHOSPHATE 4 MG/ML INJECTION SOLUTION
4 | INTRAMUSCULAR | Status: AC
Start: 2024-06-28 — End: 2024-06-28

## 2024-06-28 MED ORDER — KETOROLAC 30 MG/ML (1 ML) INJECTION SOLUTION
30 | Freq: Once | INTRAMUSCULAR | Status: AC
Start: 2024-06-28 — End: 2024-06-28
  Administered 2024-06-28: 19:00:00 15 mg via INTRAVENOUS

## 2024-06-28 MED ORDER — PROPOFOL 10 MG/ML INTRAVENOUS EMULSION
10 | INTRAVENOUS | Status: AC
Start: 2024-06-28 — End: 2024-06-28

## 2024-06-28 MED ORDER — IOHEXOL 240 MG IODINE/ML INTRAVENOUS SOLUTION
240 | INTRAVENOUS | Status: DC | PRN
Start: 2024-06-28 — End: 2024-06-28
  Administered 2024-06-28: 16:00:00 10

## 2024-06-28 MED ORDER — OXYCODONE 5 MG TABLET
5 | Freq: Once | ORAL | Status: DC
Start: 2024-06-28 — End: 2024-06-28

## 2024-06-28 MED ORDER — FENTANYL (PF) 50 MCG/ML INJECTION SOLUTION
50 | INTRAMUSCULAR | Status: AC | PRN
Start: 2024-06-28 — End: 2024-06-28
  Administered 2024-06-28 (×4): 25 ug via INTRAVENOUS

## 2024-06-28 MED ORDER — FENTANYL (PF) 50 MCG/ML INJECTION SOLUTION
50 | INTRAMUSCULAR | Status: DC | PRN
Start: 2024-06-28 — End: 2024-06-28
  Administered 2024-06-28: 15:00:00 100 via INTRAVENOUS

## 2024-06-28 MED ORDER — DEXAMETHASONE SODIUM PHOSPHATE 4 MG/ML INJECTION SOLUTION
4 | INTRAMUSCULAR | Status: DC | PRN
Start: 2024-06-28 — End: 2024-06-28
  Administered 2024-06-28: 16:00:00 4 via INTRAVENOUS

## 2024-06-28 MED ORDER — ONDANSETRON HCL (PF) 4 MG/2 ML INJECTION SOLUTION
4 | INTRAMUSCULAR | Status: AC
Start: 2024-06-28 — End: 2024-06-28

## 2024-06-28 MED ORDER — ONDANSETRON HCL (PF) 4 MG/2 ML INJECTION SOLUTION
4 | INTRAMUSCULAR | Status: DC | PRN
Start: 2024-06-28 — End: 2024-06-28
  Administered 2024-06-28: 18:00:00 4 mg via INTRAVENOUS

## 2024-06-28 MED FILL — KETOROLAC 30 MG/ML (1 ML) INJECTION SOLUTION: 30 30 mg/mL (1 mL) | INTRAMUSCULAR | Qty: 1 | Fill #0

## 2024-06-28 MED FILL — FENTANYL (PF) 50 MCG/ML INJECTION SOLUTION: 50 50 mcg/mL | INTRAMUSCULAR | Qty: 2 | Fill #0

## 2024-06-28 MED FILL — LIDOCAINE (PF) 20 MG/ML (2 %) INJECTION WRAPPER: 20 20 mg/mL (2 %) | INTRAMUSCULAR | Qty: 5 | Fill #0

## 2024-06-28 MED FILL — ONDANSETRON HCL (PF) 4 MG/2 ML INJECTION SOLUTION: 4 4 mg/2 mL | INTRAMUSCULAR | Qty: 2 | Fill #0

## 2024-06-28 MED FILL — PROPOFOL 10 MG/ML INTRAVENOUS EMULSION: 10 10 mg/mL | INTRAVENOUS | Qty: 20 | Fill #0

## 2024-06-28 MED FILL — CEFAZOLIN 1 GRAM SOLUTION FOR INJECTION: 1 1 gram | INTRAMUSCULAR | Qty: 1000 | Fill #0

## 2024-06-28 MED FILL — HYDROMORPHONE 1 MG/ML INJECTION WRAPPER: 1 1 mg/mL | INTRAMUSCULAR | Qty: 1 | Fill #0

## 2024-06-28 MED FILL — DEXAMETHASONE SODIUM PHOSPHATE 4 MG/ML INJECTION SOLUTION: 4 4 mg/mL | INTRAMUSCULAR | Qty: 1 | Fill #0

## 2024-06-28 NOTE — Telephone Encounter (Signed)
 LM for pt regarding SSR appt

## 2024-06-28 NOTE — Anesthesia Postprocedure Evaluation (Signed)
 Patient: [name]    Procedure Summary       Date: 06/28/24 Room / Location: [facility] OR 02 / [facility] Operating Room    Anesthesia Start: 1016 Anesthesia Stop: 1157    Procedure: Cystoureteroscopy, Using Holmium Laser (Left) Diagnosis:       Calculus of kidney      (Calculus of kidney [N20.0])    Surgeons: Toribio Rives, MD Responsible Provider: Loyola Marker, MD    Anesthesia Type: general ASA Status: 3            Anesthesia Type: general    Vitals Value Taken Time   BP 150/51 06/28/24 13:01   Temp 36.9 C (98.5 F) 06/28/24 11:55   Pulse 58 06/28/24 13:08   Resp 11 06/28/24 13:08   SpO2 92 % 06/28/24 13:08   Vitals shown include unfiled device data.    Anesthesia Post Evaluation Note:    Patient location during evaluation: PACU  Patient participation: complete - patient participated  Level of consciousness: awake  Cardiovascular and Hydration status: stable and vital signs within acceptable range  Respiratory Status Stable and Airway Patent: yes  Nausea and Vomiting Control Satisfactory: yes  Pain management: adequate     Aldrete score reviewed: yes  Vitals reviewed: yes  Unplanned ICU Admission: no      No notable events documented.

## 2024-06-28 NOTE — Anesthesia Procedure Notes (Signed)
 Airway    Date/Time: 06/28/2024 10:24 AM    Location:  OR  Urgency:  Elective  Difficult Airway: No    Induction Technique:  Intravenous  RSI: No    Cricoid pressure: No    ETT In Situ?: No    Anesthesiologist:  Loyola Marker, MD  Other Anesthesia Staff:  Garnette Norleen Skeeter, CRNA  Performed by:  Resident/CRNA   Loyola Marker, MD  Indications for Airway Management:  Anesthesia  Preoxygenated: Yes    Patient Position:  Sniffing  Mask Ventilation:  Easy mask  Final Airway Type:  LMA  LMA Size:  3   Sufyan Sheikh, MD  ETT Depth (cm):  22  Number of Attempts at Approach:  1

## 2024-06-28 NOTE — Discharge Instructions (Addendum)
 Discharge Instructions: After Anesthesia  You've just had surgery. During surgery you were given medicine called anesthesia to keep you relaxed and free of pain. After surgery you may have some pain or nausea. This is common. Here are some tips for feeling better and getting well after surgery.  Going home  Your doctor or nurse will show you how to take care of yourself when you go home. He or she will also answer your questions. Have an adult family member or friend drive you home. For the first 24 hours after your surgery:  Do not drive or use heavy equipment.  Do not make important decisions or sign legal papers.  Do not drink alcohol.  Have someone stay with you. He or she can watch for problems and help keep you safe.  Be sure to go to all follow-up visits with your doctor. And rest after your surgery for as long as your doctor tells you to.  Coping with pain  If you have pain after surgery, pain medicine will help you feel better. Take it as told, before pain becomes severe. Also, ask your doctor or pharmacist about other ways to control pain. This might be with heat, ice, or relaxation. And follow any other instructions your surgeon or nurse gives you.  Tips for taking pain medicine  To get the best relief possible, remember these points:  Pain medicines can upset your stomach. Taking them with a little food may help.  Most pain relievers taken by mouth need at least 20 to 30 minutes to start to work.  Taking medicine on a schedule can help you remember to take it. Try to time your medicine so that you can take it before starting an activity. This might be before you get dressed, go for a walk, or sit down for dinner.  Constipation is a common side effect of pain medicines. Call your doctor before taking any medicines such as laxatives or stool softeners to help ease constipation. Also ask if you should skip any foods. Drinking lots of fluids and eating foods such as fruits and vegetables that are high in  fiber can also help. Remember, do not take laxatives unless your surgeon has prescribed them.  Drinking alcohol and taking pain medicine can cause dizziness and slow your breathing. It can even be deadly. Do not drink alcohol while taking pain medicine.  Pain medicine can make you react more slowly to things. Do not drive or run machinery while taking pain medicine.  Your health care provider may tell you to take acetaminophen to help ease your pain. Ask him or her how much you are supposed to take each day. Acetaminophen or other pain relievers may interact with your prescription medicines or other over-the-counter (OTC) drugs. Some prescription medicines have acetaminophen and other ingredients. Using both prescription and OTC acetaminophen for pain can cause you to overdose. Read the labels on your OTC medicines with care. This will help you to clearly know the list of ingredients, how much to take, and any warnings. It may also help you not take too much acetaminophen. If you have questions or do not understand the information, ask your pharmacist or health care provider to explain it to you before you take the OTC medicine.  Managing nausea  Some people have an upset stomach after surgery. This is often because of anesthesia, pain, or pain medicine, or the stress of surgery. These tips will help you handle nausea and eat healthy foods as you get  better. If you were on a special food plan before surgery, ask your doctor if you should follow it while you get better. These tips may help:  Do not push yourself to eat. Your body will tell you when to eat and how much.  Start off with clear liquids and soup. They are easier to digest.  Next try semi-solid foods, such as mashed potatoes, applesauce, and gelatin, as you feel ready.  Slowly move to solid foods. Don't eat fatty, rich, or spicy foods at first.  Do not force yourself to have 3 large meals a day. Instead eat smaller amounts more often.  Take pain medicines  with a small amount of solid food, such as crackers or toast, to avoid nausea.     Call your surgeon if.  You still have pain an hour after taking medicine. The medicine may not be strong enough.  You feel too sleepy, dizzy, or groggy. The medicine may be too strong.  You have side effects like nausea, vomiting, or skin changes, such as rash, itching, or hives.      If you have obstructive sleep apnea  You were given anesthesia medicine during surgery to keep you comfortable and free of pain. After surgery, you may have more apnea spells because of this medicine and other medicines you were given. The spells may last longer than usual.   At home:  Keep using the continuous positive airway pressure (CPAP) device when you sleep. Unless your health care provider tells you not to, use it when you sleep, day or night. CPAP is a common device used to treat obstructive sleep apnea.  Talk with your provider before taking any pain medicine, muscle relaxants, or sedatives. Your provider will tell you about the possible dangers of taking these medicines.     Cystoscopy    Cystoscopy is a procedure that lets your doctor look directly inside your urethra and bladder. It can be used to:  Help diagnose a problem with your urethra, bladder, or kidneys.  Take a sample (biopsy) of bladder or urethral tissue.  Treat certain problems (such as removing kidney stones).  Place a stent to bypass an obstruction.  Take special X-rays of the kidneys.  Based on the findings, your doctor may recommend other tests or treatments.  What is a cystoscope?  A cystoscope is a telescope-like instrument that contains lenses and fiberoptics (small glass wires that make bright light). The cystoscope may be straight and rigid, or flexible to bend around curves in the urethra. The doctor may look directly into the cystoscope, or project the image onto a monitor.  Getting ready  Ask your doctor if you should stop taking any medicines before the  procedure.  Ask whether you should avoid eating or drinking anything after midnight before the procedure.  Follow any other instructions your doctor gives you.  Tell your doctor before the exam if you:  Take any medicines, such as aspirin or blood thinners  Have allergies to any medicines  Are pregnant  The procedure  Cystoscopy is done in the doctor's office, surgery center, or hospital. The doctor and a nurse are present during the procedure. It takes only a few minutes, longer if a biopsy, X-ray, or treatment needs to be done.  During the procedure:  You lie on an exam table on your back, knees bent and legs apart. You are covered with a drape.  Your urethra and the area around it are washed. Anesthetic jelly may be applied  to numb the urethra. Other pain medicine is usually not needed. In some cases, you may be offered a mild sedative to help you relax. If a more extensive procedure is to be done, such as a biopsy or kidney stone removal, general anesthesia may be needed.  The cystoscope is inserted. A sterile fluid is put into the bladder to expand it. You may feel pressure from this fluid.  When the procedure is done, the cystoscope is removed.  After the procedure  If you had a sedative, general anesthesia, or spinal anesthesia, you must have someone drive you home. Once you're home:  Drink plenty of fluids.  You may have burning or light bleeding when you urinate--this is normal.  Medicines may be prescribed to ease any discomfort or prevent infection. Take these as directed.  Call your doctor if you have heavy bleeding or blood clots, burning that lasts more than a day, a fever over 100F  (38 C), or trouble urinating.     2000-2018 [employer], [place]. [address], [place], [place] [address]. All rights reserved. This information is not intended as a substitute for professional medical care. Always follow your healthcare professional's instructions.   Ureteral Stents  A ureteral stent is a soft  plastic tube with holes in it. It's temporarily inserted into a ureter to help drain urine into the bladder. One end goes in the kidney. The other end goes in the bladder. A coil on each end holds the stent in place. The stent can't be seen from outside the body. It shouldn't interfere with your normal routine. Your stent will be put in by a urologist (doctor trained in treating the urinary tract) or another specialist. The procedure is done in a hospital or surgery center. You'll likely go home the same day.  When Is a Ureteral Stent Used?  A ureteral stent may be used:  To bypass a blockage in a kidney or ureter.  During kidney stone removal.  To let a ureter heal after surgery.    Before the Procedure  Your doctor will give you instructions to prepare for the procedure. X-rays or other imaging tests of your kidneys and ureters may be done beforehand.  During the Procedure  You receive medication to prevent pain and help you relax or sleep during the procedure. Once this takes effect, the procedure starts.  The doctor inserts a cystoscope (lighted instrument) through the urethra and into the bladder. This shows the opening to the ureter.  A thin wire is carefully threaded through the cystoscope, up the ureter, and into the kidney. The stent is inserted over the wire.  A fluoroscope (special x-ray machine) is used to help position the stent. When the stent is in place, the wire and cystoscope are removed.  While You Have a Stent  Some discomfort is normal. Certain movements may trigger pain or a feeling that you need to urinate. You may also feel mild soreness or pressure before or during urination. These symptoms will go away a few days after the stent is removed.  Medication to control pain or bladder spasms or to prevent infection may be prescribed. Take this as directed.  Drink plenty of fluids to help flush out your urinary tract.  Your urine may be slightly pink or red. This is due to bleeding caused by minor  irritation from the stent. This may happen on and off while you have the stent.  How Long Will You Need a Stent?  The stent is often taken out after the blockage in the ureter is treated or the ureter has healed. This may take 1-2 weeks, or longer. If a stent is needed for a long time, it may need to be changed every few months.  Call Your Doctor If:  Your urine contains blood clots.  You constantly leak urine.  You have a fever over 100.26F, chills, nausea, or vomiting.  Your pain is not relieved with medication.  The end of the stent comes out of the urethra.        [address] [place], [place] [address]. All rights reserved. This information is not intended as a substitute for professional medical care. Always follow your healthcare professional's instructions.

## 2024-06-28 NOTE — Anesthesia Preprocedure Evaluation (Addendum)
 Patient: ZYAIR RHEIN    Procedure Information       Date/Time: 06/28/24 1020    Procedure: Cystoureteroscopy, Using Holmium Laser (Left)    Location: [facility] OR 02 / [facility] Operating Room    Surgeons: Toribio Rives, MD            Relevant Problems   Cardio   (+) Chest pain      GI   (+) Peptic ulcer disease      GU/Renal   (+) Calculus of kidney      Endo   (+) Hypothyroid      Hematology   (+) Iron  deficiency anemia      Other   (+) Neuropathy       Clinical information reviewed:   Tobacco  Allergies  Meds  Problems  Med Hx  Surg Hx   Fam Hx  Soc   Hx         Physical Exam    Airway  Mallampati: II  TM distance: >3 FB  Mouth opening: >3 FB  Neck ROM: full     Cardiovascular     Rhythm: regular  Rate: normal  Functional capacity:  greater than or equal to 4 METS without symptoms   Dental - normal exam     Pulmonary - normal exam   Abdominal    General   Alert                   Anesthesia Plan    ASA 3   NPO status verified    general     Airway: LMA  Monitoring: standard monitors  Postoperative Pain Control: IV/PO analgesics    Multimodal PONV prophylaxis planned  Essential imaging and labs available and reviewed    Anesthetic plan and risks discussed with patient.  Use of blood products discussed with  Consented to blood products

## 2024-06-28 NOTE — Op Note (Signed)
 Cystoureteroscopy, Using Holmium Laser (L) Operative Note     Date: 06/28/2024  Location: [facility] OR    Name: [name], DOB: Jan 10, 1935, MRN: [id number]    Diagnosis  Pre-op Diagnosis      * Calculus of kidney [N20.0] Post-op Diagnosis     * Calculus of kidney [N20.0]     Procedures  Cystoureteroscopy, Using Holmium Laser  52353 - PR CYSTO/URETERO/PYELOSCOPY W/LITHOTRIPSY    Cystoscopy left ureteroscopy laser lithotripsy retrograde pyelgram left ureteral stent    Surgeons      * Toribio Rives - Primary    Procedure Summary  Anesthesia: General  ASA: III  Estimated Blood Loss: None  Total IV Fluids: crystalloid mL  Drains:   Ureteral Drain/Stent Left ureter  (Active)       Implants       Type Name Action Serial No.      Stent STENT URETERAL [id number] - [id number] Implanted            Staff:   Circulator: Arlyne Hopping; Shawn Clod, RN  Scrub Person: Morene Rase    Indications: [name] is an 89 y.o. female who is having surgery for Calculus of kidney [N20.0].       Procedure Details:  The patient was seen in the preoperative area. The risks, benefits, complications, treatment options, non-operative alternatives, expected recovery and outcomes were discussed with the patient. The possibilities of reaction to medication, pulmonary aspiration, injury to surrounding structures, bleeding, recurrent infection, the need for additional procedures, failure to diagnose a condition, and creating a complication requiring transfusion or operation were discussed with the patient. The patient concurred with the proposed plan, giving informed consent.  The site of surgery was properly noted/marked if necessary per policy. The patient has been actively warmed in preoperative area. Preoperative antibiotics have been ordered and given within 1 hours of incision. Venous thrombosis prophylaxis have been ordered including bilateral sequential compression devices    Findings: Patient was brought to the  operating room identified by name and number.  She was transferred to the operative table.  She was placed under general anesthesia.  She was positioned in dorsolithotomy position.  All pressure points were padded.  She was prepped and draped in usual sterile fashion.  Next rigid 22 French cystoscope inserted atraumatically patient's urethra advanced the patient's bladder.  Patient's bladder was inspected its entirety there were no stones tumors or concerning erythema noted.  Patient's orifices were normal orthotopic position.  The left ureter orifice was cannulated with a 0.035 sensor wire the cystoscope was removed.  Semirigid ureteroscopy was performed with the assistance of a 0.035 zip wire and a train track method up to the level proximal ureter.  This patient's ureter was patent and there was no stone or abnormality noted.  The additional safety wire was left in place and the scope was removed.  An 11 x 13 French 36 cm access sheath was placed.  Initially the inner cannula was advanced over the sensor wire with a zip wire in place as a safety wire.  Then this was removed and then the inner cannula and sheath was placed.  This was noted passed easily into the ureter under fluoroscopy.  This was advanced up to the level of the renal pelvis.  Flexible ureteral pyeloscopy was then performed.  The patient's stone was encountered in the renal pelvis and dusted with the majority of the stone dusted.  At the end a few small pieces were  created with the laser and these were popcorn until there was no sizable fragments remaining.  Retrograde pyelogram at the end which revealed no extravasation of contrast.  There was still significant dilation of the renal pelvis and calyces.  The scope and sheath were removed under direct vision and there was a very small rent in the distal ureter from the placement of the ureteral access sheath.  It was deemed prudent to place a double-J ureteral stent at this point.  The ureter was  measured to be 22 cm after the cystoscope was backloaded over the wire with a 5 French open-ended catheter.  A 6 French by 22 double-J ureteral stent was selected and advanced over the wire with good proximal curl noted within the renal pelvis and good distal curl directly visualized the patient's bladder.  String was left attached to the stent.  Patient's bladder was copiously irrigated and drained she was then awoken from general anesthesia and transferred recovery in stable condition    Complications:  None; patient tolerated the procedure well.     Disposition: PACU - hemodynamically stable.  Condition: stable        Toribio Rives  Phone Number: [phone]

## 2024-06-28 NOTE — H&P (Signed)
 PATIENT NAME: [name]   DATE OF BIRTH: 12/29/1934           CHIEF COMPLAINT:No chief complaint on file.        Subjective:         [name] is a 89 y.o. female who presents to the OR for treatment of Renal Stone.       PReviously in the  office for follow up of Kidney stone.   She is concerned with left flank pain that occurs daily. The pain is in the left flank.  She has been taking oxycodone  and has had gross hematuria.  She was evaluated with Dr. Victory who recommended ureteroscopy laser lithotripsy.                         Medications          Prior to Admission medications   Medication Sig Start Date End Date Taking? Authorizing Provider   levothyroxine  (Synthroid , Levoxyl ) 112 mcg tablet TAKE 1 TABLET BY MOUTH ONCE DAILY. 03/20/24     Deward KANDICE Cotta, MD   mirtazapine  (Remeron ) 15 mg tablet TAKE 1 TABLET BY MOUTH EVERYDAY AT BEDTIME 05/04/24     Deward KANDICE Cotta, MD   ondansetron  (Zofran ) 4 mg tablet Take 1 tablet (4 mg) by mouth every 8 (eight) hours if needed for nausea or vomiting. 05/13/24     Deward KANDICE Cotta, MD   oxyCODONE -acetaminophen  (Percocet ) 5-325 mg tablet Take 1 tablet by mouth every 6 (six) hours if needed for pain score 7-10 (severe). 05/23/24     Deward KANDICE Cotta, MD   pantoprazole  (ProtoNix ) 40 mg EC tablet TAKE 1 TABLET (40 MG) BY MOUTH TWICE DAILY. DO NOT CRUSH, CHEW, OR SPLIT. 03/09/24     Deward KANDICE Cotta, MD   pregabalin  (Lyrica ) 50 mg capsule TAKE 1 CAPSULE BY MOUTH EVERY DAY 03/20/24     Deward KANDICE Cotta, MD   oxyCODONE -acetaminophen  (Percocet ) 5-325 mg tablet Take 1 tablet by mouth every 6 (six) hours if needed for pain score 7-10 (severe). 05/18/24 05/23/24   Deward KANDICE Cotta, MD         Vitals:  There were no vitals taken for this visit.     Physical Exam:  General: Alert, in no acute distress  Head: Normocephalic, atraumatic  Neck: supple, trachea is midline, no obvious masses  Respiratory: normal effort, no audible wheezes  Cardiovascular: regular pulse and no  cyanosis  Musculoskeletal: moving all extremities, normal tone  Skin: warm and dry  Psych: normal mood and affect, oriented  Abdomen: soft, non distended, non tender  GU: no CVA tenderness        LABS:  No results found for: PSAFREE, PSAFREEPCT  No results for input(s): PSAFREE, PSAFREEPCT in the last 72 hours.  No results found for: TESTOSTERONE        Lab Results   Component Value Date     WBC 5.1 04/21/2024     HGB 12.1 04/21/2024     HCT 38.6 04/21/2024     MCV 91 04/21/2024     PLT 237 04/21/2024            Lab Results   Component Value Date     GLUCOSE 122 (H) 04/21/2024     CALCIUM 8.5 (L) 04/21/2024     NA 136 04/21/2024     K 4.3 04/21/2024     CO2 24 04/21/2024     CL 103  04/21/2024     BUN 20 04/21/2024     CREATININE 0.52 (L) 04/21/2024      No components found for: [facility]           Radiology Review:   Narrative & Impression   Procedure: CT ABDOMEN PELVIS W CONTRAST  05/17/2024 4:31 PM     Indications: 89 years old Female patient with left flank pain . History of nephrolithiasis     Comparison: CT scans from 07/02/2020, 12/24/2023 and 04/13/2020 and ultrasound from 04/05/2024     TECHNIQUE:   CT of the abdomen and pelvis with intravenous and with oral contrast.  Dose reduction techniques including automated exposure control and adjustment of the Wilder and/or KV according to the patient size were utilized.     FINDINGS:  LOWER THORAX:  Mild atelectasis or scarring in both lung bases.     HEPATOBILIARY:  No focal hepatic lesions. Mild intrahepatic biliary ductal dilatation. The common bile duct looks normal in caliber. No shortening mass or stone. There is a stable small cluster of vessels in the left lobe of the liver, which is nonspecific but could represent a small intrahepatic shunt.. A few subcentimeter hypodensities in the right lobe of the liver which are too small to characterize but probably represent cysts.     GALLBLADDER:  There are distended, which appears not significantly changed in  comparison to the prior examination. No abnormal wall thickening.     SPLEEN:  The spleen is not enlarged.     PANCREAS:  There is no pancreatic ductal dilatation or peripancreatic inflammation.     ADRENALS:  There is no adrenal nodule.     KIDNEYS/URETERS:  There is a large, 1.6 cm stone in the left renal collecting system. There is associated moderate fullness of the renal pelvis with associated circumferential wall thickening with mild stranding. This has progressed in comparison to the prior examination. The kidneys symmetrically enhance. There is no discrete left renal mass. There is mild left renal cortical thinning. There is no ureteral calculus.     The right kidney homogeneously enhances without evidence of a mass. There is mild renal cortical thinning. There is no nephrolithiasis or hydronephrosis.     PELVIC ORGANS/BLADDER:  Several calcified uterine fibroids. Stable small benign-appearing calcifications in the left ovary. No bladder calculi. The urinary bladder contour is smooth. There are few phleboliths in the pelvis.     PERITONEUM / RETROPERITONEUM:  There is no free intraperitoneal gas or fluid.     LYMPH NODES: There are no enlarged lymph nodes in the abdomen or pelvis.     VESSELS:  Extensive aortic and iliac artery atheromatous calcifications without aneurysmal dilatation. At least moderate narrowing of the common iliac arteries.SABRA     GI TRACT: There are postsurgical changes status post a distal gastrectomy and Billroth procedure. Surgical chain sutures are also noted at the proximal transverse colon. There is a moderate amount of stool in the transverse colon. There is no obstruction. Oral contrast has extended through to the proximal ascending colon. There is no bowel obstruction or inflammatory change.     BONES AND SOFT TISSUES:  Diffuse bony demineralization. Moderate multilevel degenerative changes in the spine. Mild chronic endplate scalloping at L3, T12 and T11     IMPRESSION:     1.6 cm  stone in the medial left renal collecting system with mild to moderate fullness of the left renal collecting system with increasing wall thickening of the renal collecting system.  This may be post inflammatory as opposed to tumor. No evidence of a discrete renal mass.. No right-sided nephrolithiasis.     Redemonstration of distention of the gallbladder with new mild intrahepatic biliary ductal dilatation. No definite obstructing common bile duct stone or mass. Clinical correlation is recommended.     Stable chronic and postsurgical changes as above.     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

## 2024-06-28 NOTE — Telephone Encounter (Signed)
-----   Message from Dr. Toribio Rives, MD sent at 06/28/2024 11:54 AM EST -----  This patient needs string stent removal on 07/04/24.  She will need renal US  4 weeks after this will follow up with me after that.      Thanks!    Toribio Rives, MD

## 2024-07-03 NOTE — Telephone Encounter (Signed)
 Patient notified she will try the ibuprofen. Her last BM was yesterday.

## 2024-07-03 NOTE — Telephone Encounter (Signed)
 Pt is calling back looking to speak with Jon

## 2024-07-03 NOTE — Telephone Encounter (Signed)
 When did her pain start?  Does anything make it any better or worse?  Any f/c/n/v?  Why is she not supposed to take Excedrin?

## 2024-07-03 NOTE — Telephone Encounter (Signed)
 Pt brother called and he is stating that she in a lot of pain pls advise   They would like a call back

## 2024-07-03 NOTE — Telephone Encounter (Signed)
 Looks like procedure done with urology

## 2024-07-03 NOTE — Telephone Encounter (Signed)
 Can take ibuprofen 400 mg po Q8h with food, water.  She has gotten ~150 percocet  over the past 6 weeks.  When was her last BM?

## 2024-07-04 ENCOUNTER — Ambulatory Visit: Admit: 2024-07-04 | Discharge: 2024-07-04 | Payer: MEDICARE | Primary: Family Medicine

## 2024-07-04 DIAGNOSIS — N200 Calculus of kidney: Principal | ICD-10-CM

## 2024-07-04 NOTE — Progress Notes (Signed)
Pt here for string stent removal. Stent removed intact and pt tolerated the procedure well. Pt encouraged to hydrate well today. Pt may expect some minor bleeding and cramping over the next few hours. Dr. Groves is present in office and immediately available to provide assistance and direction. Pt to call with any questions or concerns. Services provided by Angela King, RN

## 2024-07-24 MED ORDER — MIRTAZAPINE 30 MG TABLET
30 | ORAL_TABLET | Freq: Every evening | ORAL | 1 refills | 30.00000 days | Status: AC
Start: 2024-07-24 — End: 2024-10-22

## 2024-07-24 NOTE — Telephone Encounter (Signed)
 Last filled 05/04/24  LOV 04/19/24

## 2024-07-26 ENCOUNTER — Encounter: Payer: MEDICARE | Attending: Urology | Primary: Family Medicine

## 2024-07-26 NOTE — Progress Notes (Unsigned)
 Toribio Rives, MD       Office follow up          PATIENT NAME: [name]   DATE OF BIRTH: [birth year]         CHIEF COMPLAINT:No chief complaint on file.      Subjective:       [name] is a 89 y.o. female who presents to the office for follow up of Kidney stone sp laser litho stent placement on 06/28/24 and string stent removal on 07/04/24.             She is concerned with left flank pain that occurs daily. The pain is in the left flank.  She has been taking oxycodone  and has had gross hematuria.  She was evaluated with Dr. Victory who recommended ureteroscopy laser lithotripsy.                  Medications  Prior to Admission medications   Medication Sig Start Date End Date Taking? Authorizing Provider   levothyroxine  (Synthroid , Levoxyl ) 112 mcg tablet TAKE 1 TABLET BY MOUTH ONCE DAILY. 03/20/24   Deward KANDICE Cotta, MD   mirtazapine  (Remeron ) 15 mg tablet TAKE 1 TABLET BY MOUTH EVERYDAY AT BEDTIME 05/04/24   Deward KANDICE Cotta, MD   ondansetron  (Zofran ) 4 mg tablet Take 1 tablet (4 mg) by mouth every 8 (eight) hours if needed for nausea or vomiting. 05/13/24   Deward KANDICE Cotta, MD   oxyCODONE -acetaminophen  (Percocet ) 5-325 mg tablet Take 1 tablet by mouth every 6 (six) hours if needed for pain score 7-10 (severe). 05/23/24   Deward KANDICE Cotta, MD   pantoprazole  (ProtoNix ) 40 mg EC tablet TAKE 1 TABLET (40 MG) BY MOUTH TWICE DAILY. DO NOT CRUSH, CHEW, OR SPLIT. 03/09/24   Deward KANDICE Cotta, MD   pregabalin  (Lyrica ) 50 mg capsule TAKE 1 CAPSULE BY MOUTH EVERY DAY 03/20/24   Deward KANDICE Cotta, MD   oxyCODONE -acetaminophen  (Percocet ) 5-325 mg tablet Take 1 tablet by mouth every 6 (six) hours if needed for pain score 7-10 (severe). 05/18/24 05/23/24  Deward KANDICE Cotta, MD        Vitals:  There were no vitals taken for this visit.    Physical Exam:  General: Alert, in no acute distress  Head: Normocephalic, atraumatic  Neck: supple, trachea is midline, no obvious masses  Respiratory: normal effort, no audible  wheezes  Cardiovascular: regular pulse and no cyanosis  Musculoskeletal: moving all extremities, normal tone  Skin: warm and dry  Psych: normal mood and affect, oriented  Abdomen: soft, non distended, non tender  GU: no CVA tenderness      LABS:  No results found for: PSAFREE, PSAFREEPCT  No results for input(s): PSAFREE, PSAFREEPCT in the last 72 hours.  No results found for: TESTOSTERONE  Lab Results   Component Value Date    WBC 4.7 06/14/2024    HGB 10.8 (L) 06/14/2024    HCT 36.1 06/14/2024    MCV 92 06/14/2024    PLT 248 06/14/2024     Lab Results   Component Value Date    GLUCOSE 92 06/14/2024    CALCIUM 8.1 (L) 06/14/2024    NA 139 06/14/2024    K 4.9 06/14/2024    CO2 28 06/14/2024    CL 104 06/14/2024    BUN 17 06/14/2024    CREATININE 0.63 06/14/2024     No components found for: [facility]        Radiology Review:   Narrative &  Impression   Procedure: CT ABDOMEN PELVIS W CONTRAST  05/17/2024 4:31 PM     Indications: 89 years old Female patient with left flank pain . History of nephrolithiasis     Comparison: CT scans from 07/02/2020, 12/24/2023 and 04/13/2020 and ultrasound from 04/05/2024     TECHNIQUE:   CT of the abdomen and pelvis with intravenous and with oral contrast.  Dose reduction techniques including automated exposure control and adjustment of the Ada and/or KV according to the patient size were utilized.     FINDINGS:  LOWER THORAX:  Mild atelectasis or scarring in both lung bases.     HEPATOBILIARY:  No focal hepatic lesions. Mild intrahepatic biliary ductal dilatation. The common bile duct looks normal in caliber. No shortening mass or stone. There is a stable small cluster of vessels in the left lobe of the liver, which is nonspecific but could represent a small intrahepatic shunt.. A few subcentimeter hypodensities in the right lobe of the liver which are too small to characterize but probably represent cysts.     GALLBLADDER:  There are distended, which appears not significantly  changed in comparison to the prior examination. No abnormal wall thickening.     SPLEEN:  The spleen is not enlarged.     PANCREAS:  There is no pancreatic ductal dilatation or peripancreatic inflammation.     ADRENALS:  There is no adrenal nodule.     KIDNEYS/URETERS:  There is a large, 1.6 cm stone in the left renal collecting system. There is associated moderate fullness of the renal pelvis with associated circumferential wall thickening with mild stranding. This has progressed in comparison to the prior examination. The kidneys symmetrically enhance. There is no discrete left renal mass. There is mild left renal cortical thinning. There is no ureteral calculus.     The right kidney homogeneously enhances without evidence of a mass. There is mild renal cortical thinning. There is no nephrolithiasis or hydronephrosis.     PELVIC ORGANS/BLADDER:  Several calcified uterine fibroids. Stable small benign-appearing calcifications in the left ovary. No bladder calculi. The urinary bladder contour is smooth. There are few phleboliths in the pelvis.     PERITONEUM / RETROPERITONEUM:  There is no free intraperitoneal gas or fluid.     LYMPH NODES: There are no enlarged lymph nodes in the abdomen or pelvis.     VESSELS:  Extensive aortic and iliac artery atheromatous calcifications without aneurysmal dilatation. At least moderate narrowing of the common iliac arteries.SABRA     GI TRACT: There are postsurgical changes status post a distal gastrectomy and Billroth procedure. Surgical chain sutures are also noted at the proximal transverse colon. There is a moderate amount of stool in the transverse colon. There is no obstruction. Oral contrast has extended through to the proximal ascending colon. There is no bowel obstruction or inflammatory change.     BONES AND SOFT TISSUES:  Diffuse bony demineralization. Moderate multilevel degenerative changes in the spine. Mild chronic endplate scalloping at L3, T12 and T11      IMPRESSION:     1.6 cm stone in the medial left renal collecting system with mild to moderate fullness of the left renal collecting system with increasing wall thickening of the renal collecting system. This may be post inflammatory as opposed to tumor. No evidence of a discrete renal mass.. No right-sided nephrolithiasis.     Redemonstration of distention of the gallbladder with new mild intrahepatic biliary ductal dilatation. No definite obstructing common bile duct  stone or mass. Clinical correlation is recommended.     Stable chronic and postsurgical changes as above.     Glendia Mower, MD 05/18/2024 7:58 AM       Impression/Plan    89 y.o. female with history of Left UPJ obstruction, Left Renal Stone 1.6 cm, s/p laser litho stent placement on 06/28/24 and string stent removal on 07/04/24.    -discussed observation vs intervention    -recommend cysto urs laser litho stent placement discussed risks of staged procedure and stent    -counseled on risks of bleeding, infection, damage to surrounding organs, expected recovery, ureteral stent placement, possibility of additional procedures     Toribio Rives MD

## 2024-07-27 ENCOUNTER — Inpatient Hospital Stay
Admit: 2024-07-27 | Discharge: 2024-07-27 | Disposition: A | Discharge status: Home / Self Care | Payer: MEDICARE | Arrived: AM | Attending: Emergency Medicine

## 2024-07-27 ENCOUNTER — Emergency Department: Admit: 2024-07-27 | Payer: MEDICARE | Primary: Family Medicine

## 2024-07-27 DIAGNOSIS — N390 Urinary tract infection, site not specified: Secondary | ICD-10-CM

## 2024-07-27 DIAGNOSIS — R109 Unspecified abdominal pain: Principal | ICD-10-CM

## 2024-07-27 LAB — LIPASE: Lipase: 63 U/L (ref 13–75)

## 2024-07-27 LAB — CBC WITH DIFFERENTIAL
Basophils %: 0.2 %
Basophils Absolute: 0.02 K/uL (ref 0.00–0.22)
Eosinophils %: 0.1 %
Eosinophils Absolute: 0.01 K/uL (ref 0.00–0.50)
Hematocrit: 35.5 % (ref 32.0–47.0)
Hemoglobin: 11 g/dL (ref 11.0–16.0)
Immature Granulocytes %: 0.2 %
Immature Granulocytes Absolute: 0.02 K/uL (ref 0.00–0.10)
Lymphocyte %: 9.1 %
Lymphocytes Absolute: 0.88 K/uL (ref 0.70–4.00)
MCH: 26.8 pg (ref 26.0–34.0)
MCHC: 31 g/dL (ref 31.0–37.0)
MCV: 86.6 fL (ref 80.0–100.0)
MPV: 11 fL (ref 9.1–12.4)
Monocytes %: 7.4 %
Monocytes Absolute: 0.71 K/uL (ref 0.36–0.77)
NRBC %: 0 % (ref 0.0–0.0)
NRBC Absolute: 0 K/uL (ref 0.00–2.00)
Neutrophil %: 83 %
Neutrophils Absolute: 8.01 K/uL — ABNORMAL HIGH (ref 1.50–7.95)
Platelets: 329 K/uL (ref 150–400)
RBC: 4.1 M/uL (ref 3.70–5.20)
RDW-CV: 15.3 % — ABNORMAL HIGH (ref 11.5–14.5)
RDW-SD: 48.1 fL (ref 35.0–51.0)
WBC: 9.7 K/uL (ref 4.0–11.0)

## 2024-07-27 LAB — URINALYSIS REFLEX TO CULTURE
Bilirubin, Ur: NEGATIVE
Casts, Ur: 1 /LPF (ref 0–4)
Epithelial Cells, UR: 1 {cells}/[HPF] (ref 0–5)
Glucose,Ur: NEGATIVE mg/dL
Ketones, Ur: NEGATIVE mg/dL
Nitrite, Ur: NEGATIVE
RBC, Ur: 46 {cells}/[HPF] — ABNORMAL HIGH (ref 0–4)
Specific Gravity, Ur: >=1.03 (ref 1.005–1.030)
Urobilinogen, Ur: 0.2 U/dL (ref 0.2–1.0)
WBC, Ur: 108 {cells}/[HPF] — ABNORMAL HIGH (ref 0–5)
pH, Ur: 5 (ref 5.0–8.0)

## 2024-07-27 LAB — COMPREHENSIVE METABOLIC PANEL
ALT: 30 U/L (ref 0–55)
AST: 21 U/L (ref 6–42)
Albumin: 3 g/dL — ABNORMAL LOW (ref 3.2–5.0)
Alkaline phosphatase: 136 U/L — ABNORMAL HIGH (ref 30–130)
Anion Gap: 6 mmol/L (ref 3–14)
BUN: 26 mg/dL — ABNORMAL HIGH (ref 6–24)
Bilirubin, total: 0.3 mg/dL (ref 0.2–1.2)
CO2 (Bicarbonate): 26 mmol/L (ref 20–32)
Calcium: 8.3 mg/dL — ABNORMAL LOW (ref 8.5–10.5)
Chloride: 109 mmol/L (ref 98–110)
Creatinine: 0.49 mg/dL — ABNORMAL LOW (ref 0.55–1.30)
Glucose: 111 mg/dL — ABNORMAL HIGH (ref 70–110)
Potassium: 4.1 mmol/L (ref 3.6–5.2)
Protein, total: 6.2 g/dL (ref 6.0–8.4)
Sodium: 141 mmol/L (ref 135–146)
eGFRcr: 91 mL/min/1.73m*2 (ref >=60)

## 2024-07-27 LAB — RAINBOW DRAW SST GOLD TOP

## 2024-07-27 LAB — LIGHT BLUE TOP

## 2024-07-27 MED ORDER — MORPHINE 2 MG/ML INJECTION WRAPPER
2 | Freq: Once | INTRAMUSCULAR | Status: AC
Start: 2024-07-27 — End: 2024-07-27
  Administered 2024-07-27: 14:00:00 2 mg via INTRAVENOUS

## 2024-07-27 MED ORDER — CEFTRIAXONE 1 GRAM SOLUTION FOR INJECTION
1 | Freq: Once | INTRAMUSCULAR | Status: DC
Start: 2024-07-27 — End: 2024-07-27

## 2024-07-27 MED ORDER — SODIUM CHLORIDE 0.9 % IV BOLUS
0.9 | Freq: Once | INTRAVENOUS | Status: AC
Start: 2024-07-27 — End: 2024-07-27
  Administered 2024-07-27: 14:00:00 500 mL via INTRAVENOUS

## 2024-07-27 MED ORDER — CEFPODOXIME 200 MG TABLET
200 | ORAL_TABLET | Freq: Two times a day (BID) | ORAL | 0 refills | 7.00000 days | Status: DC
Start: 2024-07-27 — End: 2024-07-31

## 2024-07-27 MED ORDER — IOHEXOL 350 MG IODINE/ML INTRAVENOUS SOLUTION
350 | Freq: Once | INTRAVENOUS | Status: AC
Start: 2024-07-27 — End: 2024-07-27
  Administered 2024-07-27: 16:00:00 100 mL via INTRAVENOUS

## 2024-07-27 MED ORDER — CEFTRIAXONE 1 GRAM SOLUTION FOR INJECTION
1 | Freq: Once | INTRAMUSCULAR | Status: AC
Start: 2024-07-27 — End: 2024-07-27
  Administered 2024-07-27: 19:00:00 1 g via INTRAVENOUS

## 2024-07-27 MED ORDER — IOHEXOL 240 MG IODINE/ML INTRAVENOUS SOLUTION
240 | Freq: Once | INTRAVENOUS | Status: DC
Start: 2024-07-27 — End: 2024-07-27

## 2024-07-27 MED FILL — CEFTRIAXONE 1 GRAM SOLUTION FOR INJECTION: 1 1 gram | INTRAMUSCULAR | Qty: 1 | Fill #0

## 2024-07-27 MED FILL — MORPHINE 2 MG/ML INJECTION WRAPPER: 2 2 mg/mL | INTRAMUSCULAR | Qty: 1 | Fill #0

## 2024-07-27 NOTE — ED Triage Notes (Signed)
 89 year old female comes in by EMS from home. Patient had surgery 1 month ago  for a kidney stone. Still c/o pain on left side. Some nausea and vomiting last night. Pale. Alert and oriented

## 2024-07-27 NOTE — Discharge Instructions (Signed)
 You were seen here in the ER and evaluated.  The ER doctor spoke to the urologist on-call.  We went over the risks and benefits of admission versus discharge at this time decision made to take the workup and management as an outpatient    [facility] paramedics will go to the house tomorrow at 3:30 PM to check in on you.  They will give you a dose of IV antibiotics    Your next dose of oral antibiotics should be Saturday morning, January 10.    Please call your primary care doctor on Monday for follow-up.  Please call urology tomorrow as well for follow-up    Return for worsening pain, fevers, chills, numbness tingling, or any other concerning signs or symptoms.  If something is not right please come back and see us .  We would rather see you a 2nd time instead of trying to figure this out home alone

## 2024-07-27 NOTE — ED Provider Notes (Signed)
 [facility]  [address]  [place] [address]        HPI   Chief Complaint   Patient presents with    Abdominal Pain        HPI    89 year old female comes to the ER for evaluation.  She tells me that since she had surgery on her kidney she has had this pain she had a couple days of relief but pretty much a week after the surgery she has had persistent pain.  She does not like coming to the hospital so she delayed until now.  She thinks the pain is different from her typical kidney pain because usually that on the left side this is more the mid abdomen.  She denies a headache there is no neck stiffness she denies chest pain or shortness of breath no fevers or chills.  She has no other complaints    Patient History   Medical History[1]  Surgical History[2]  Family History[3]  Social History     Tobacco Use    Smoking status: Never     Passive exposure: Never    Smokeless tobacco: Never   Vaping Use    Vaping status: Never Used   Substance Use Topics    Alcohol use: Never    Drug use: Never       Review of Systems   Review of Systems    Physical Exam   ED Triage Vitals   Temp Pulse Resp BP   07/27/24 0847 07/27/24 0847 07/27/24 0847 07/27/24 0903   37.2 C (99 F) 94 18 120/66      SpO2 Temp Source Heart Rate Source Patient Position   07/27/24 0847 07/27/24 0847 07/27/24 0847 --   99 % Oral Monitor       BP Location FiO2 (%)     -- --             Physical Exam    Constitutional: Patient is oriented to person, place, and time. Patient appears well-developed and well-nourished.  Not ill or toxic appearing  Head: Normocephalic and atraumatic.  Right Ear: External ear normal.  Left Ear: External ear normal.  Nose: Nose normal.  Eyes: EOM are normal. Pupils are equal, round, and reactive to light.  Neck: Normal range of motion. Neck supple. No JVD present.  Cardiovascular: Normal rate, regular rhythm and normal heart sounds. ?  Pulmonary/Chest: Effort normal and breath sounds normal. No  stridor. No respiratory distress. Patient has no wheezes. Patient has no rales. Patient exhibits no tenderness.  Abdominal: Soft. Patient exhibits no distension. There is tenderness to the LLQ.  No CVAT  Musculoskeletal: Normal range of motion. Patient exhibits no edema, tenderness or deformity.  Neurological: Patient is alert and oriented to person, place, and time. No cranial nerve deficits.  Skin: Skin is warm and dry. No rash noted. No diaphoresis. No erythema.  Psychiatric: Normal mood and affect    No orders to display       Labs Reviewed - No data to display    Glasgow Coma Scale Score: 15      ED Course & MDM        Medical Decision Making  Amount and/or Complexity of Data Reviewed  Labs: ordered.  Radiology: ordered.    Risk  Prescription drug management.        Patient was screened for Social Drivers of Health during this encounter and found to have concerns in the following  domains:   financial insecurity    The After Visit Summary provided to the patient includes information on how to find resources related to these needs.    External Records Reviewed known to Dr. Jodean with Urology.  I reviewed the note from 06/28/2024.  Hx of Lithotripsy and kidney stone.      89 year old female comes to the ER for evaluation.  Patient is tender on exam as I think we should do a CAT scan.  I think treated with IV contrast to make sure there is no other etiology of her pain.  I do not think it is referred pain to the chest I do not we need an EKG and ultimately the troponin.  Plan for IV fluids analgesia CT reevaluate    13:29 I spoke to Urology who recommends outpatient follow-up.  Escalation of care considered and I went over the risks and benefits of admission versus discharge given the flu epidemic the fact that there were no beds in the hospital given that her pain is better both the patient and myself feel the risk of staying in the hospital outweigh the benefits.  Good return precautions provided, stable for  discharge.  To help optimize her outpatient success we will have [facility] go to the house tomorrow to give one more dose of abx and to repeat vitals and do exam and make sure the patient can tolerate the pain at home.    Final diagnosis  Abd pain  Possible  UTI                      [1]   Past Medical History:  Diagnosis Date    Anxiety     Arthritis     Colon cancer     Depression     Duodenal ulcer     GERD (gastroesophageal reflux disease)     Headache     HL (hearing loss)     Hx of carcinoid syndrome     Hypothyroid     Insomnia     Iron  deficiency anemia     Neuropathy     Peptic ulcer disease     Renal stone     Skin cancer, basal cell    [2]   Past Surgical History:  Procedure Laterality Date    ADENOIDECTOMY      APPENDECTOMY      COLON SURGERY      COLONOSCOPY      ESOPHAGOGASTRODUODENOSCOPY  11/24/2019    ESOPHAGOGASTRODUODENOSCOPY  04/05/2020    EXPLORATORY LAPAROTOMY  03/12/2020    with partial gastrectomy and redo of Billroth II    EXPLORATORY LAPAROTOMY  01/2019    with Arlyss patch repair of duodenal ulcer and appendectomy    LYSIS OF ADHESIONS  02/20/2020    with Billroth II    PR CYSTO/URETERO W/LITHOTRIPSY &INDWELL STENT INSRT Left 06/28/2024    Procedure: Cystoureteroscopy, With Holmium Laser Lithotripsy And Stent Insertion;  Surgeon: Toribio Jodean, MD;  Location: [facility] OR;  Service: Urology    PR CYSTO/URETERO/PYELOSCOPY W/LITHOTRIPSY Left 06/28/2024    Procedure: Cystoureteroscopy, With Holmium Laser Lithotripsy And Stent Insertion;  Surgeon: Toribio Jodean, MD;  Location: [facility] OR;  Service: Urology    RIGHT COLECTOMY  01/2019   [3] No family history on file.       Glendia Roys, MD  07/27/24 [phone]

## 2024-07-27 NOTE — Patient Pass (Signed)
 Patient Education  Table of Contents   Belly (Abdominal) Pain in Adults: What It Means   UTI (Urinary Tract Infection) in Females: What to Know    To view videos and all your education online visit,  [email or web address].elsevier.[email or web address]  or scan this QR code with your smartphone.  Access to this content will expire in one year.  Belly (Abdominal) Pain in Adults: What It Means    Many things can cause belly (abdominal) pain. In most cases, belly pain is not a serious problem and can be watched and treated at home. But in some cases, it can be serious.  Your doctor will try to find the cause of your belly pain.  Follow these instructions at home:  Medicines   Take over-the-counter and prescription medicines only as told by your doctor.   Do not take medicines that help you poop (laxatives) unless told by your doctor.  General instructions   Watch your belly pain for any changes. Tell your doctor if the pain gets worse.   Drink enough fluid to keep your pee (urine) pale yellow.  Contact a doctor if:   Your belly pain changes or gets worse.   You have very bad cramping or bloating in your belly.   You vomit.   Your pain gets worse with meals, after eating, or with certain foods.   You have trouble pooping or have watery poop for more than 2?3 days.   You are not hungry, or you lose weight without trying.   You have signs of not getting enough fluid or water (dehydration). These may include:  ? Dark pee, very little pee, or no pee.  ? Cracked lips or dry mouth.  ? Feeling sleepy or weak.   You have pain when you pee or poop.   Your belly pain wakes you up at night.   You have blood in your pee.   You have a fever.  Get help right away if:   You cannot stop vomiting.   Your pain is only in one part of your belly, like on the right side.   You have bloody or black poop, or poop that looks like tar.   You have trouble breathing.   You have chest pain.  These symptoms may be an emergency. Get help right away. Call 911.   Do not  wait to see if the symptoms will go away.   Do not drive yourself to the hospital.  This information is not intended to replace advice given to you by your health care provider. Make sure you discuss any questions you have with your health care provider.  Document Released: 2007-12-23 Document Updated: 2024-05-10 Document Reviewed: 2022-04-22  Elsevier Patient Education ? 2025 [employer]  UTI (Urinary Tract Infection) in Females: What to Know    A urinary tract infection (UTI) is an infection in your urinary tract. The urinary tract is made up of organs that make, store, and get rid of pee (urine) in your body. These organs include:   The kidneys.   The ureters.   The bladder.   The urethra.  What are the causes?  Most UTIs are caused by germs called bacteria. They may be in or near your genitals. These germs grow and cause swelling in your urinary tract.  What increases the risk?  You're more likely to get a UTI if:   You're a female. The urethra is shorter in females than in males.   You have a soft  tube called a catheter that drains your pee.   You can't control when you pee or poop.   You have trouble peeing because of:  ? A kidney stone.  ? A urinary blockage.  ? A nerve condition that affects your bladder.  ? Not getting enough to drink.   You're sexually active.   You use a birth control inside your vagina, like spermicide.   You're pregnant.   You have low levels of the hormone estrogen in your body.   You're an older adult.  You're also more likely to get a UTI if you have other health problems. These may include:   Diabetes.   A weak immune system. Your immune system is your body's defense system.   Sickle cell disease.   Injury of the spine.  What are the signs or symptoms?  Symptoms may include:   Needing to pee right away.   Peeing small amounts often.   Pain or burning when you pee.   Blood in your pee.   Pee that smells bad or odd.   Pain in your belly or lower back.  You may also:   Feel confused.  This may be the first symptom in older adults.   Vomit.   Not feel hungry.   Feel tired or easily annoyed.   Have a fever or chills.  How is this diagnosed?  A UTI is diagnosed based on your medical history and an exam. You may also have other tests. These may include:   Pee tests.   Blood tests.   Tests for sexually transmitted infections (STIs).  If you've had more than one UTI, you may need to have imaging studies done to find out why you keep getting them.  How is this treated?  A UTI can be treated by:   Taking antibiotics or other medicines.   Drinking enough fluid to keep your pee pale yellow.  In rare cases, a UTI can cause a very bad condition called sepsis. Sepsis may be treated in the hospital.  Follow these instructions at home:  Medicines   Take your medicines only as told by your health care provider.   If you were given antibiotics, take them as told by your provider. Do not stop taking them even if you start to feel better.  General instructions   Make sure you:  ? Pee often and fully. Do not hold your pee for a long time.  ? Wipe from front to back after you pee or poop. Use each tissue only once when you wipe.  ? Pee after you have sex.   Do not douche or use sprays or powders in your genital area.  Contact a health care provider if:   Your symptoms don't get better after 1?2 days of taking antibiotics.   Your symptoms go away and then come back.   You have a fever or chills.   You vomit or feel like you may vomit.  Get help right away if:   You have very bad pain in your back or lower belly.   You faint.  This information is not intended to replace advice given to you by your health care provider. Make sure you discuss any questions you have with your health care provider.  Document Released: 2022-10-09 Document Updated: 2024-05-18 Document Reviewed: 2022-10-09  Elsevier Patient Education ? 2025 [employer].

## 2024-07-28 ENCOUNTER — Ambulatory Visit: Admit: 2024-07-28 | Payer: MEDICARE | Primary: Family Medicine

## 2024-07-28 DIAGNOSIS — N390 Urinary tract infection, site not specified: Principal | ICD-10-CM

## 2024-07-28 NOTE — Telephone Encounter (Signed)
 Pt went to the hospital yesterday. Ended up having a CT Scan. She wants to know if she needs to still go for u/s on 08/02/24.SABRA Pls advise Shanda so she can cancel u/s if necessary and let pt know.

## 2024-07-28 NOTE — MIH Note (Signed)
 [facility] Note       [facility] PROGRAM  [address]  [place] [address]    Date of service: 07/28/2024    REASON FOR VISIT  Evaluation of UTI with possible antibiotics administered.    MEDICATIONS  Current Outpatient Medications   Medication Instructions    cefpodoxime  (VANTIN ) 200 mg, oral, 2 times daily    docusate sodium  (COLACE) 100 mg, oral, 2 times daily PRN    levothyroxine  (SYNTHROID , LEVOXYL ) 112 mcg, oral, Daily    mirtazapine  (REMERON ) 30 mg, oral, Nightly    multivitamin with folic acid  400 mcg tablet tablet 1 tablet, Daily    ondansetron  (ZOFRAN ) 4 mg, oral, Every 8 hours PRN    pantoprazole  (PROTONIX ) 40 mg, oral, 2 times daily, Do not crush, chew, or split.    pregabalin  (LYRICA ) 50 mg, oral, Daily       ALLERGIES  Allergies[1]    SUBJECTIVE  [name] is a 89 y.o. female. [name] who is being evaluated for UTI.  Patient complains of lower left abdominal quadrant pain, dizziness, weakness and shortness of breath with exertion.  Patient denies shortness of breath at rest, nausea, fever, chills, diarrhea, or muscle cramps.    OBJECTIVE  Vitals:    07/28/24 1553 07/28/24 1707   BP: 112/58 122/66   BP Location: Right arm Right arm   Patient Position: Sitting Sitting   Pulse: 85 87   Resp: 18 18   Temp: 38 C (100.4 F)    TempSrc: Skin    SpO2: 96% 100%        PHYSICAL EXAM  Physical Exam  HENT:      Head: Normocephalic.   Cardiovascular:      Rate and Rhythm: Normal rate.   Pulmonary:      Effort: Pulmonary effort is normal.      Breath sounds: Normal breath sounds.   Skin:     General: Skin is warm and dry.      Capillary Refill: Capillary refill takes less than 2 seconds.   Neurological:      Mental Status: She is alert and oriented to person, place, and time.               ASSESSMENT & PLAN  Well-appearing 89 year old female.  Patient does not appear to be in any acute distress.  Patient's work of breathing appears normal and she is able to speak complete sentences.  Patient  ambulates unassisted and has a steady gait.  Exertion did not appear to increase patient's work of breathing.  Patient advised she recently had a kidney stone removed from her left kidney.  Plan:  Assessment and vitals  POC Chem-8  Possible IV Rocephin     DATA  No results found for: POCTSODIUM, POCTPOTASSIU, POCTCHLORIDE, TCO2VEN, POCTBUN, POCTCREAT, POCGLU, POCTHGB, POCTHCT, POCTAGAP     EKG    TREATMENT(S):    Assessment and vitals  Established 20-gauge normal saline lock in right AC  POC Chem-8 with results sent via secure Tiger text to [facility] with abnormal values noted  Administered Rocephin  1 g x 1 IV infusion over 30 minutes.  Removed IV  POST TREATMENT(S):   Without change  CLINICAL IMPRESSION:   UTI  DISPOSITION:    Follow-up with PCP    [facility] Visit Information  Visit Type: Scheduled  Purpose of Visit: Other (Comment) (Evaluation for UTI and possible Rocephen administration)  Visit Disposition: Assessed/Treated at Home  Follow Up Disposition: Follow up  with PCP  Provider-Preventable Conditions: None of the Above (UTI)  [facility] Services Provided: IV Treatment  Clinical Impressions: Other (Comment) (UTI)      Referring Provider/Department  Glendia Roys, MD  [address]  [place] [address]  [phone]    Follow up        Alonna Haw  [employer] Paramedic     Care was provided using real-time audio and visual communication tools.            [1] No Known Allergies

## 2024-07-29 LAB — POCT BMP + H&H
POCT Anion Gap: 18 mmol/L — ABNORMAL HIGH (ref 3–14)
POCT BUN: 28 mg/dL — ABNORMAL HIGH (ref 6–24)
POCT CO2 (Bicarbonate): 24 mmol/L (ref 20–32)
POCT Chloride: 103 mmol/L (ref 98–110)
POCT Creatinine: 0.7 mg/dL (ref 0.55–1.30)
POCT Glucose: 100 mg/dL (ref 70–110)
POCT Hematocrit: 36 % (ref 32–47)
POCT Hemoglobin: 12.2 g/dL (ref 11.0–16.0)
POCT Potassium: 4.1 mmol/L (ref 3.6–5.2)
POCT Sodium: 140 mmol/L (ref 135–146)

## 2024-07-29 MED ORDER — CEFTRIAXONE 1 GRAM SOLUTION FOR INJECTION
1 | INTRAMUSCULAR | Status: AC
Start: 2024-07-29 — End: 2024-07-28
  Administered 2024-07-28: 22:00:00 1 g via INTRAVENOUS

## 2024-07-29 NOTE — Result Encounter Note (Signed)
 PRELIM  Urine Culture 50,000 - 99,000 CFU/mL Enterococcus faecalis Abnormal    50,000 - 99,000 CFU/mL Aerococcus urinae Abnormal         RX   Quantity Refills Start End   cefpodoxime  (Vantin ) 200 mg tablet 14 tablet 0/0 07/27/2024 08/03/2024   Sig:   Take 1 tablet (200 mg) by mouth twice daily for 7 days.     Route:   oral       Following UC

## 2024-07-30 LAB — URINE CULTURE

## 2024-07-30 NOTE — Result Encounter Note (Signed)
 35 F    DX  Urinary tract infection without hematuria, site unspecified    PRELIM  Urine Culture 50,000 - 99,000 CFU/mL Enterococcus faecalis Abnormal    50,000 - 99,000 CFU/mL Aerococcus urinae Abnormal         RX  cefpodoxime  (Vantin ) 200 mg tablet 14 tablet 0/0 07/27/2024 08/03/2024   Sig:   Take 1 tablet (200 mg) by mouth twice daily for 7 days.     Route:   oral         NKDA    Following UC

## 2024-07-30 NOTE — Result Encounter Note (Signed)
 69 F     DX  Urinary tract infection without hematuria, site unspecified          FINAL  Urine Culture 50,000 - 99,000 CFU/mL Enterococcus faecalis Abnormal    50,000 - 99,000 CFU/mL Aerococcus urinae Abnormal         RX  cefpodoxime  (Vantin ) 200 mg tablet 14 tablet 0/0 07/27/2024 08/03/2024   Sig:   Take 1 tablet (200 mg) by mouth twice daily for 7 days.       Route:   oral           *NO CEPHALOSPORINS on sensitivities  She was seen by [facility] 07/28/24 and received     CEFTRIAXONE  IVPB 1 G MINI-BAG PLUS IN 100 ML NS  cefTRIAXone  (Rocephin ) 1 g in sodium chloride  0.9 % 100 mL IVPB-MBP    NKDA    Any change in treatment?

## 2024-07-31 ENCOUNTER — Ambulatory Visit: Payer: MEDICARE | Primary: Family Medicine

## 2024-07-31 MED ORDER — LEVOFLOXACIN 500 MG TABLET
500 | ORAL_TABLET | Freq: Every day | ORAL | 0 refills | 5.00000 days | Status: AC
Start: 2024-07-31 — End: 2024-08-10

## 2024-07-31 NOTE — Result Encounter Note (Signed)
 Voicemail also left for Randine, daughter

## 2024-07-31 NOTE — Result Encounter Note (Signed)
 Randine, daughter returned call, updated, she will contact mother to update her with new prescription, and stop cefpodoxime 

## 2024-07-31 NOTE — Result Encounter Note (Signed)
 Voicemail left for callback

## 2024-07-31 NOTE — Result Encounter Note (Signed)
 Cephalosporins will not cover for Enterococcus.  Given that there was concerns of UTI status post lithotripsy will need to prescribe better coverage.  Will send a prescription for Levaquin  to patient's pharmacy.  Please contact the patient to have her stop taking cefpodoxime .  Please make sure the patient has scheduled outpatient follow-up with urology

## 2024-08-01 ENCOUNTER — Encounter: Payer: MEDICARE | Attending: Urology | Primary: Family Medicine

## 2024-08-01 MED ORDER — ONDANSETRON HCL 4 MG TABLET
4 | ORAL_TABLET | Freq: Three times a day (TID) | ORAL | 1 refills | 7.00000 days | Status: AC | PRN
Start: 2024-08-01 — End: ?

## 2024-08-02 ENCOUNTER — Ambulatory Visit: Payer: MEDICARE | Primary: Family Medicine

## 2024-08-04 ENCOUNTER — Ambulatory Visit: Admit: 2024-08-04 | Discharge: 2024-08-04 | Payer: MEDICARE | Attending: Urology | Primary: Family Medicine

## 2024-08-04 DIAGNOSIS — N3000 Acute cystitis without hematuria: Principal | ICD-10-CM

## 2024-08-04 LAB — POCT URINALYSIS DIPSTICK
POCT Bilirubin, UA: NEGATIVE
POCT Glucose, UA: NEGATIVE
POCT Ketones, UA: NEGATIVE
POCT Leukocyte Esterase, UA: NEGATIVE
POCT Nitrites, UA: NEGATIVE
POCT Protein, UA: NEGATIVE
POCT Specific Gravity, UA: 1.02 (ref 1.003–1.030)
POCT Urobilinogen, UA: NEGATIVE
POCT pH, UA: 6.5

## 2024-08-04 NOTE — Progress Notes (Signed)
 Elaine Rives, MD       Office follow up          PATIENT NAME: [name]   DATE OF BIRTH: February 26, 1935         CHIEF COMPLAINT:No chief complaint on file.      Subjective:       [name] is a 89 y.o. female who presents to the office for follow up of Kidney stone sp laser litho stent placement on 06/28/24 and string stent removal on 07/04/24.  ED encounter on 07/27/24.  CT with renal pelvis dilation and stone debris in lower pole.  The pain is improving. She has had intermittent nausea 3-4 x per week.  Denies vomiting.  She did take some antacid tablets to see if that improved.  She is taking levofloxacin .  Denies any other pain medication.      She is drinking 2 bottles per day and trying to increase 3 bottles      History of kidney surgery as 12 and 22 year on kidney likely UPJ.  In surgery, UPJ with stenosis and likely cause of stone formation        PREVIOUSLY:     She is concerned with left flank pain that occurs daily. The pain is in the left flank.  She has been taking oxycodone  and has had gross hematuria.  She was evaluated with Dr. Victory who recommended ureteroscopy laser lithotripsy.                  Medications  Prior to Admission medications   Medication Sig Start Date End Date Taking? Authorizing Provider   levothyroxine  (Synthroid , Levoxyl ) 112 mcg tablet TAKE 1 TABLET BY MOUTH ONCE DAILY. 03/20/24   Deward Elaine Cotta, MD   mirtazapine  (Remeron ) 15 mg tablet TAKE 1 TABLET BY MOUTH EVERYDAY AT BEDTIME 05/04/24   Deward Elaine Cotta, MD   ondansetron  (Zofran ) 4 mg tablet Take 1 tablet (4 mg) by mouth every 8 (eight) hours if needed for nausea or vomiting. 05/13/24   Deward Elaine Cotta, MD   oxyCODONE -acetaminophen  (Percocet ) 5-325 mg tablet Take 1 tablet by mouth every 6 (six) hours if needed for pain score 7-10 (severe). 05/23/24   Deward Elaine Cotta, MD   pantoprazole  (ProtoNix ) 40 mg EC tablet TAKE 1 TABLET (40 MG) BY MOUTH TWICE DAILY. DO NOT CRUSH, CHEW, OR SPLIT. 03/09/24   Deward Elaine Cotta, MD    pregabalin  (Lyrica ) 50 mg capsule TAKE 1 CAPSULE BY MOUTH EVERY DAY 03/20/24   Deward Elaine Cotta, MD   oxyCODONE -acetaminophen  (Percocet ) 5-325 mg tablet Take 1 tablet by mouth every 6 (six) hours if needed for pain score 7-10 (severe). 05/18/24 05/23/24  Deward Elaine Cotta, MD        Vitals:  Ht 1.6 m   Wt 40.3 kg   BMI 15.73 kg/m     Physical Exam:  General: Alert, in no acute distress  Head: Normocephalic, atraumatic  Neck: supple, trachea is midline, no obvious masses  Respiratory: normal effort, no audible wheezes  Cardiovascular: regular pulse and no cyanosis  Musculoskeletal: moving all extremities, normal tone  Skin: warm and dry  Psych: normal mood and affect, oriented  Abdomen: soft, non distended, non tender  GU: no CVA tenderness      LABS:  No results found for: PSAFREE, PSAFREEPCT  No results for input(s): PSAFREE, PSAFREEPCT in the last 72 hours.  No results found for: TESTOSTERONE  Lab Results   Component Value Date  WBC 9.7 07/27/2024    HGB 11.0 07/27/2024    HCT 35.5 07/27/2024    MCV 86.6 07/27/2024    PLT 329 07/27/2024     Lab Results   Component Value Date    GLUCOSE 111 (H) 07/27/2024    CALCIUM 8.3 (L) 07/27/2024    NA 141 07/27/2024    K 4.1 07/27/2024    CO2 26 07/27/2024    CL 109 07/27/2024    BUN 26 (H) 07/27/2024    CREATININE 0.49 (L) 07/27/2024     No components found for: [facility]        Radiology Review:   Narrative & Impression   Procedure: CT ABDOMEN PELVIS W CONTRAST  05/17/2024 4:31 PM     Indications: 89 years old Female patient with left flank pain . History of nephrolithiasis     Comparison: CT scans from 07/02/2020, 12/24/2023 and 04/13/2020 and ultrasound from 04/05/2024     TECHNIQUE:   CT of the abdomen and pelvis with intravenous and with oral contrast.  Dose reduction techniques including automated exposure control and adjustment of the [place] and/or KV according to the patient size were utilized.     FINDINGS:  LOWER THORAX:  Mild atelectasis or scarring in both  lung bases.     HEPATOBILIARY:  No focal hepatic lesions. Mild intrahepatic biliary ductal dilatation. The common bile duct looks normal in caliber. No shortening mass or stone. There is a stable small cluster of vessels in the left lobe of the liver, which is nonspecific but could represent a small intrahepatic shunt.. A few subcentimeter hypodensities in the right lobe of the liver which are too small to characterize but probably represent cysts.     GALLBLADDER:  There are distended, which appears not significantly changed in comparison to the prior examination. No abnormal wall thickening.     SPLEEN:  The spleen is not enlarged.     PANCREAS:  There is no pancreatic ductal dilatation or peripancreatic inflammation.     ADRENALS:  There is no adrenal nodule.     KIDNEYS/URETERS:  There is a large, 1.6 cm stone in the left renal collecting system. There is associated moderate fullness of the renal pelvis with associated circumferential wall thickening with mild stranding. This has progressed in comparison to the prior examination. The kidneys symmetrically enhance. There is no discrete left renal mass. There is mild left renal cortical thinning. There is no ureteral calculus.     The right kidney homogeneously enhances without evidence of a mass. There is mild renal cortical thinning. There is no nephrolithiasis or hydronephrosis.     PELVIC ORGANS/BLADDER:  Several calcified uterine fibroids. Stable small benign-appearing calcifications in the left ovary. No bladder calculi. The urinary bladder contour is smooth. There are few phleboliths in the pelvis.     PERITONEUM / RETROPERITONEUM:  There is no free intraperitoneal gas or fluid.     LYMPH NODES: There are no enlarged lymph nodes in the abdomen or pelvis.     VESSELS:  Extensive aortic and iliac artery atheromatous calcifications without aneurysmal dilatation. At least moderate narrowing of the common iliac arteries.SABRA     GI TRACT: There are postsurgical  changes status post a distal gastrectomy and Billroth procedure. Surgical chain sutures are also noted at the proximal transverse colon. There is a moderate amount of stool in the transverse colon. There is no obstruction. Oral contrast has extended through to the proximal ascending colon. There is no bowel obstruction  or inflammatory change.     BONES AND SOFT TISSUES:  Diffuse bony demineralization. Moderate multilevel degenerative changes in the spine. Mild chronic endplate scalloping at L3, T12 and T11     IMPRESSION:     1.6 cm stone in the medial left renal collecting system with mild to moderate fullness of the left renal collecting system with increasing wall thickening of the renal collecting system. This may be post inflammatory as opposed to tumor. No evidence of a discrete renal mass.. No right-sided nephrolithiasis.     Redemonstration of distention of the gallbladder with new mild intrahepatic biliary ductal dilatation. No definite obstructing common bile duct stone or mass. Clinical correlation is recommended.     Stable chronic and postsurgical changes as above.     Glendia Mower, MD 05/18/2024 7:58 AM       Impression/Plan    89 y.o. female with history of Left UPJ obstruction, Left Renal Stone 1.6 cm, s/p laser litho stent placement on 06/28/24 and string stent removal on 07/04/24.    -increase water intake as able    -finish levofloxacin  as prescribed    -follow up CT in 6 months    -call for increased pain/nausea vomiting    -rtc 6 months    Elaine Rives MD

## 2024-08-05 LAB — URINALYSIS
Bilirubin Ur: NEGATIVE
Glucose Ur: NEGATIVE
Ketones Ur: NEGATIVE
Nitrite Ur: NEGATIVE
Specific Gravity Ur: 1.015 (ref 1.005–1.030)
Urobilinogen,Semi-Qn Ur: 0.2 mg/dL (ref 0.2–1.0)
pH Ur: 6 (ref 5.0–7.5)

## 2024-08-05 LAB — URINE MICROSCOPIC (REFLEX ONLY)
Bacteria Ur: NONE SEEN
Casts Ur: NONE SEEN /LPF
Epithelial Cells (non renal) Ur: NONE SEEN /HPF (ref 0–10)

## 2024-08-09 ENCOUNTER — Ambulatory Visit: Admit: 2024-08-09 | Discharge: 2024-08-09 | Payer: MEDICARE | Attending: Family Medicine | Primary: Family Medicine

## 2024-08-09 DIAGNOSIS — N200 Calculus of kidney: Principal | ICD-10-CM

## 2024-08-09 NOTE — Progress Notes (Signed)
 [facility]  [address]  [address]  [place] [address]  Dept: [phone]  Dept Fax: [phone]     Patient ID: [name] is a 89 y.o. female who presents for Follow-up (6 month f/u patient states regarding surgery from kidney stones).    Subjective  HPI  Here in follow up.  Had lithotrypsy and stent last month.  Stone was blasted and smaller stones are still present on the left.  She feels better.      No fever.   No constipation.    Urine is clear currently.       Long hx of gastric outlet obstruction and dysmotility.  Bowels improved and has been off motegrity    Anemia resolved.   Thyroid  normal when last checked.     On mirtazipine for sleep.  Lyrica  for legs.  Wants to increase.    Current Outpatient Medications   Medication Instructions    docusate sodium  (COLACE) 100 mg, oral, 2 times daily PRN    levoFLOXacin  (LEVAQUIN ) 500 mg, oral, Daily    levothyroxine  (SYNTHROID , LEVOXYL ) 112 mcg, oral, Daily    mirtazapine  (REMERON ) 30 mg, oral, Nightly    ondansetron  (ZOFRAN ) 4 mg, oral, Every 8 hours PRN    pantoprazole  (PROTONIX ) 40 mg, oral, 2 times daily, Do not crush, chew, or split.    pregabalin  (LYRICA ) 50 mg, oral, Daily   All:  none   soc:  no smoking.  No alcohol.  Still works at [employer]  Review of Systems   Constitutional: Negative.    HENT: Negative.     Eyes: Negative.    Respiratory: Negative.     Cardiovascular: Negative.    Gastrointestinal:  Positive for abdominal pain and nausea.   Genitourinary:  Positive for flank pain.   Musculoskeletal:  Positive for back pain.   Skin: Negative.    Neurological: Negative.    Psychiatric/Behavioral: Negative.          Objective  Visit Vitals  BP 90/60 (BP Location: Left arm, Patient Position: Sitting, BP Cuff Size: Adult)   Pulse 70   Ht 1.6 m   Wt 40.8 kg Comment: 90 lbs   SpO2 98%   BMI 15.95 kg/m   OB Status Postmenopausal   BSA 1.35 m     Physical Exam  BP 90/60 (BP Location: Left arm, Patient Position: Sitting, BP Cuff Size:  Adult)   Pulse 70   Ht 1.6 m   Wt 40.8 kg Comment: 90 lbs  SpO2 98%   BMI 15.95 kg/m     General Appearance:    Alert, cooperative, no distress, appears stated age   Head:    Normocephalic, without obvious abnormality, atraumatic   Eyes:    PERRL, conjunctiva/corneas clear, EOM's intact, fundi     benign, both eyes        Ears:    Normal TM's and external ear canals, both ears   Nose:   Nares normal, septum midline, mucosa normal, no drainage    or sinus tenderness   Throat:   Lips, mucosa, and tongue normal; teeth and gums normal   Neck:   Supple, symmetrical, trachea midline, no adenopathy;        thyroid :  No enlargement/tenderness/nodules; no carotid    bruit or JVD   Back:        Lungs:     Clear to auscultation bilaterally, respirations unlabored   Chest wall:    No tenderness or deformity  Heart:    Regular rate and rhythm, S1 and S2 normal, no murmur, rub    or gallop   Abdomen:     Soft, non-tender, bowel sounds active all four quadrants,     no masses, no organomegaly    Flank tenderness resolved.   Old scar in this area    Genitalia:     Rectal:     Extremities:   Extremities normal, atraumatic, no cyanosis or edema   Pulses:   2+ and symmetric all extremities   Skin:   Skin color, texture, turgor normal, no rashes or lesions   Lymph nodes:   Cervical, supraclavicular, and axillary nodes normal   Neurologic:   CNII-XII intact. Normal strength, sensation and reflexes       throughout         Assessment / Plan  [name] was seen today for follow-up.  Renal stone  Comments:  symptoms improved after lithotrypsy.    followed by urology now  Dysmotility of stomach  Comments:  better.  off motegrity   Iron  deficiency anemia, unspecified iron  deficiency anemia type  Comments:  hct normal when checked earlier this month  Acquired hypothyroidism  Comments:  TSH normal when last checked  Psychophysiological insomnia  Comments:  on mirtazipine.  increased  Restless leg syndrome  Comments:  on lyrica .  wants to  increase to bid

## 2024-08-19 MED ORDER — PREGABALIN 100 MG CAPSULE
100 | ORAL_CAPSULE | Freq: Every day | ORAL | 1 refills | 30.00000 days | Status: AC
Start: 2024-08-19 — End: ?
